# Patient Record
Sex: Female | Born: 1970 | Race: Black or African American | Hispanic: No | Marital: Single | Smoking: Never smoker
Health system: Southern US, Community
[De-identification: ages and names within clinical notes are randomized; demographics above are authoritative.]

## PROBLEM LIST (undated history)

## (undated) DIAGNOSIS — F32A Depression, unspecified: Secondary | ICD-10-CM

## (undated) DIAGNOSIS — E119 Type 2 diabetes mellitus without complications: Secondary | ICD-10-CM

## (undated) DIAGNOSIS — E282 Polycystic ovarian syndrome: Secondary | ICD-10-CM

## (undated) DIAGNOSIS — F419 Anxiety disorder, unspecified: Secondary | ICD-10-CM

## (undated) DIAGNOSIS — R011 Cardiac murmur, unspecified: Secondary | ICD-10-CM

## (undated) DIAGNOSIS — E079 Disorder of thyroid, unspecified: Secondary | ICD-10-CM

## (undated) DIAGNOSIS — E349 Endocrine disorder, unspecified: Secondary | ICD-10-CM

## (undated) DIAGNOSIS — C73 Malignant neoplasm of thyroid gland: Secondary | ICD-10-CM

## (undated) DIAGNOSIS — I10 Essential (primary) hypertension: Secondary | ICD-10-CM

## (undated) DIAGNOSIS — E0500 Thyrotoxicosis with diffuse goiter without thyrotoxic crisis or storm: Secondary | ICD-10-CM

## (undated) DIAGNOSIS — D219 Benign neoplasm of connective and other soft tissue, unspecified: Secondary | ICD-10-CM

## (undated) DIAGNOSIS — N943 Premenstrual tension syndrome: Secondary | ICD-10-CM

## (undated) DIAGNOSIS — F329 Major depressive disorder, single episode, unspecified: Secondary | ICD-10-CM

## (undated) DIAGNOSIS — E785 Hyperlipidemia, unspecified: Secondary | ICD-10-CM

## (undated) DIAGNOSIS — E039 Hypothyroidism, unspecified: Secondary | ICD-10-CM

## (undated) DIAGNOSIS — M1990 Unspecified osteoarthritis, unspecified site: Secondary | ICD-10-CM

## (undated) DIAGNOSIS — K219 Gastro-esophageal reflux disease without esophagitis: Secondary | ICD-10-CM

## (undated) DIAGNOSIS — T7840XA Allergy, unspecified, initial encounter: Secondary | ICD-10-CM

## (undated) DIAGNOSIS — F319 Bipolar disorder, unspecified: Secondary | ICD-10-CM

## (undated) DIAGNOSIS — R413 Other amnesia: Secondary | ICD-10-CM

## (undated) DIAGNOSIS — R42 Dizziness and giddiness: Secondary | ICD-10-CM

## (undated) DIAGNOSIS — J302 Other seasonal allergic rhinitis: Secondary | ICD-10-CM

## (undated) DIAGNOSIS — G43909 Migraine, unspecified, not intractable, without status migrainosus: Secondary | ICD-10-CM

## (undated) DIAGNOSIS — F4310 Post-traumatic stress disorder, unspecified: Secondary | ICD-10-CM

## (undated) DIAGNOSIS — D649 Anemia, unspecified: Secondary | ICD-10-CM

## (undated) HISTORY — DX: Hyperlipidemia, unspecified: E78.5

## (undated) HISTORY — DX: Gastro-esophageal reflux disease without esophagitis: K21.9

## (undated) HISTORY — PX: HERNIA REPAIR: SHX51

## (undated) HISTORY — DX: Polycystic ovarian syndrome: E28.2

## (undated) HISTORY — DX: Benign neoplasm of connective and other soft tissue, unspecified: D21.9

## (undated) HISTORY — PX: COLONOSCOPY: SHX174

## (undated) HISTORY — DX: Endocrine disorder, unspecified: E34.9

## (undated) HISTORY — PX: APPENDECTOMY: SHX54

## (undated) HISTORY — PX: ABDOMINAL HYSTERECTOMY: SHX81

## (undated) HISTORY — DX: Thyrotoxicosis with diffuse goiter without thyrotoxic crisis or storm: E05.00

## (undated) HISTORY — DX: Depression, unspecified: F32.A

## (undated) HISTORY — DX: Essential (primary) hypertension: I10

## (undated) HISTORY — DX: Allergy, unspecified, initial encounter: T78.40XA

## (undated) HISTORY — PX: DIAGNOSTIC LAPAROSCOPY: SUR761

## (undated) HISTORY — DX: Major depressive disorder, single episode, unspecified: F32.9

## (undated) HISTORY — PX: OTHER SURGICAL HISTORY: SHX169

## (undated) HISTORY — DX: Anemia, unspecified: D64.9

## (undated) HISTORY — DX: Premenstrual tension syndrome: N94.3

## (undated) HISTORY — PX: DILATION AND CURETTAGE OF UTERUS: SHX78

## (undated) HISTORY — DX: Other amnesia: R41.3

---
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
## 1999-05-03 ENCOUNTER — Encounter: Admission: RE | Admit: 1999-05-03 | Discharge: 1999-05-21 | Payer: Self-pay | Admitting: *Deleted

## 2003-06-14 DIAGNOSIS — C73 Malignant neoplasm of thyroid gland: Secondary | ICD-10-CM

## 2003-06-14 HISTORY — DX: Malignant neoplasm of thyroid gland: C73

## 2004-06-13 HISTORY — PX: TOTAL THYROIDECTOMY: SHX2547

## 2008-06-13 HISTORY — PX: MYOMECTOMY: SHX85

## 2010-08-13 ENCOUNTER — Encounter: Payer: Self-pay | Admitting: Internal Medicine

## 2010-08-13 ENCOUNTER — Ambulatory Visit
Payer: BC Managed Care – PPO | Attending: Internal Medicine | Admitting: Rehabilitative and Restorative Service Providers"

## 2010-08-13 ENCOUNTER — Other Ambulatory Visit: Payer: BC Managed Care – PPO

## 2010-08-13 ENCOUNTER — Ambulatory Visit (INDEPENDENT_AMBULATORY_CARE_PROVIDER_SITE_OTHER): Payer: BC Managed Care – PPO | Admitting: Internal Medicine

## 2010-08-13 ENCOUNTER — Other Ambulatory Visit: Payer: Self-pay | Admitting: Internal Medicine

## 2010-08-13 ENCOUNTER — Telehealth: Payer: Self-pay | Admitting: Internal Medicine

## 2010-08-13 DIAGNOSIS — E559 Vitamin D deficiency, unspecified: Secondary | ICD-10-CM | POA: Insufficient documentation

## 2010-08-13 DIAGNOSIS — IMO0001 Reserved for inherently not codable concepts without codable children: Secondary | ICD-10-CM | POA: Insufficient documentation

## 2010-08-13 DIAGNOSIS — R269 Unspecified abnormalities of gait and mobility: Secondary | ICD-10-CM | POA: Insufficient documentation

## 2010-08-13 DIAGNOSIS — R279 Unspecified lack of coordination: Secondary | ICD-10-CM

## 2010-08-13 DIAGNOSIS — E039 Hypothyroidism, unspecified: Secondary | ICD-10-CM | POA: Insufficient documentation

## 2010-08-13 DIAGNOSIS — R1084 Generalized abdominal pain: Secondary | ICD-10-CM | POA: Insufficient documentation

## 2010-08-13 DIAGNOSIS — D649 Anemia, unspecified: Secondary | ICD-10-CM | POA: Insufficient documentation

## 2010-08-13 LAB — HEPATIC FUNCTION PANEL
ALT: 14 U/L (ref 0–35)
Albumin: 3.8 g/dL (ref 3.5–5.2)
Alkaline Phosphatase: 44 U/L (ref 39–117)
Bilirubin, Direct: 0.1 mg/dL (ref 0.0–0.3)
Total Protein: 7.2 g/dL (ref 6.0–8.3)

## 2010-08-13 LAB — CBC WITH DIFFERENTIAL/PLATELET
Basophils Relative: 0.7 % (ref 0.0–3.0)
Eosinophils Absolute: 0.2 10*3/uL (ref 0.0–0.7)
Eosinophils Relative: 1.7 % (ref 0.0–5.0)
Hemoglobin: 9.9 g/dL — ABNORMAL LOW (ref 12.0–15.0)
MCHC: 32.4 g/dL (ref 30.0–36.0)
MCV: 81.1 fl (ref 78.0–100.0)
Monocytes Absolute: 0.7 10*3/uL (ref 0.1–1.0)
Neutro Abs: 6.6 10*3/uL (ref 1.4–7.7)
Neutrophils Relative %: 65.2 % (ref 43.0–77.0)
RBC: 3.77 Mil/uL — ABNORMAL LOW (ref 3.87–5.11)
WBC: 10.2 10*3/uL (ref 4.5–10.5)

## 2010-08-13 LAB — IBC PANEL
Saturation Ratios: 7.6 % — ABNORMAL LOW (ref 20.0–50.0)
Transferrin: 309.7 mg/dL (ref 212.0–360.0)

## 2010-08-13 LAB — BASIC METABOLIC PANEL
CO2: 28 mEq/L (ref 19–32)
Chloride: 103 mEq/L (ref 96–112)
Creatinine, Ser: 0.7 mg/dL (ref 0.4–1.2)
Potassium: 3.9 mEq/L (ref 3.5–5.1)
Sodium: 139 mEq/L (ref 135–145)

## 2010-08-13 LAB — CONVERTED CEMR LAB: Tissue Transglutaminase Ab, IgA: 4.4 units (ref <20)

## 2010-08-13 LAB — B12 AND FOLATE PANEL: Vitamin B-12: 606 pg/mL (ref 211–911)

## 2010-08-15 ENCOUNTER — Encounter: Payer: Self-pay | Admitting: Internal Medicine

## 2010-08-19 NOTE — Assessment & Plan Note (Signed)
Summary: NEW PT BCBS--#--PKG/OFF---STC   Vital Signs:  Patient profile:   40 year old female Menstrual status:  regular LMP:     07/14/2010 Height:      66.50 inches (168.91 cm) Weight:      255.38 pounds (116.08 kg) BMI:     40.75 O2 Sat:      98 % on Room air Temp:     98.1 degrees F (36.72 degrees C) oral Pulse rate:   76 / minute Pulse rhythm:   regular Resp:     14 per minute BP sitting:   120 / 70  (left arm) Cuff size:   large  Nutrition Counseling: Patient's BMI is greater than 25 and therefore counseled on weight management options.  O2 Flow:  Room air CC: NP to establish.Pt c/o dizziness/vertigo, nausea, headaches started in 05/2010.sls,cma Is Patient Diabetic? No Pain Assessment Patient in pain? no      LMP (date): 07/14/2010     Menstrual Status regular Enter LMP: 07/14/2010   Primary Care Provider:  Yetta Barre  CC:  NP to establish.Pt c/o dizziness/vertigo, nausea, headaches started in 05/2010.sls, and cma.  History of Present Illness: New to me her story is long. She tells me that she felt fine until 3 months ago when she develop a non-specific viral illness while living in [place]. She saw her PCP and was referred to multiple specialtsts ( Neurology, Cardiology, ENT, and Neuro-Rehab) and she tells me that she had extensive testing with labs, MRI, Holter monitor and ENG. She tells me that she was found to be anemic and had some benign palpitations on the Holter. There are no old records available to me today. She has had persistent symptoms including ataxia, dizziness, fatigue, abd pain, headaches, vertigo, and nausea. She tells me that she applied for STD and that her IM doctor in [place] fired her b/c " he could not help her anymore". 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  Preventive Screening-Counseling & Management  Alcohol-Tobacco     Alcohol drinks/day: <1     Alcohol type:  all     >5/day in last 3 mos: no     Alcohol Counseling: not indicated; use of alcohol is not excessive or problematic     Feels need to cut down: no     Feels annoyed by complaints: no     Feels guilty re: drinking: no     Needs 'eye opener' in am: no     Smoking Status: never     Tobacco Counseling: not indicated; no tobacco use  Caffeine-Diet-Exercise     Does Patient Exercise: no  Hep-HIV-STD-Contraception     Hepatitis Risk: no risk noted     HIV Risk: no risk noted     STD Risk: no risk noted      Sexual History:  currently monogamous.        Drug Use:  no.        Blood Transfusions:  no.    Medications Prior to Update: 1)  None  Current Medications (verified): 1)  Cyanocobalamin 1000 Mcg/ml Soln (Cyanocobalamin) .... Inject 1ml Monthly Subq 2)  Vitamin D3 1000 Unit Tabs (Cholecalciferol) .... (1) Once Daily 3)  Hydrocodone-Acetaminophen 5-500 Mg Tabs (Hydrocodone-Acetaminophen) .... (1) Q6 Hours As Needed For Pain 4)  Synthroid 88 Mcg Tabs (Levothyroxine Sodium) .... (1) Tab By Mouth Two Times A  Day 5)  Metoprolol Tartrate 25 Mg Tabs (Metoprolol Tartrate) .... 1/2 Tab By Mouth Two Times A Day 6)  Levbid 0.375 Mg Xr12h-Tab (Hyoscyamine Sulfate) .... One By Mouth Two Times A Day As Needed For Abd Pain  Allergies (verified): 1)  ! Aspirin 2)  ! Penicillin 3)  ! Morphine 4)  ! Codeine 5)  ! Ibuprofen  Past History:  Past Medical History: Hypothyroidism B-12 defic Anemia-NOS Fibroids Palpitations PCOS Vit D defic  Past Surgical History: Laparotomy-exploratory in 2010 Thyroidectomy  Family History: Family History of Alcoholism/Addiction Family History Diabetes 1st degree relative Family History Hypertension  Social History: Occupation: Disabled, Midwife Single Never Smoked Alcohol use-yes Drug use-no Regular exercise-no Smoking Status:  never Hepatitis Risk:  no risk noted HIV Risk:  no risk noted STD Risk:  no risk noted Sexual  History:  currently monogamous Blood Transfusions:  no Drug Use:  no Does Patient Exercise:  no  Review of Systems       The patient complains of weight gain, headaches, and abdominal pain.  The patient denies anorexia, fever, weight loss, chest pain, syncope, dyspnea on exertion, peripheral edema, prolonged cough, hemoptysis, melena, hematochezia, severe indigestion/heartburn, hematuria, muscle weakness, suspicious skin lesions, transient blindness, difficulty walking, depression, unusual weight change, abnormal bleeding, enlarged lymph nodes, and angioedema.   General:  Complains of fatigue and malaise; denies chills, fever, loss of appetite, sleep disorder, sweats, weakness, and weight loss. CV:  Complains of fatigue, lightheadness, and weight gain; denies bluish discoloration of lips or nails, chest pain or discomfort, difficulty breathing at night, difficulty breathing while lying down, fainting, leg cramps with exertion, near fainting, palpitations, shortness of breath with exertion, swelling of feet, and swelling of hands. Neuro:  Complains of poor balance and sensation of room spinning; denies disturbances in coordination, falling down, headaches, inability to speak, memory loss, numbness, seizures, tingling, tremors, visual disturbances, and weakness. Psych:  Denies alternate hallucination ( auditory/visual), anxiety, depression, easily angered, easily tearful, irritability, mental problems, panic attacks, sense of great danger, suicidal thoughts/plans, thoughts of violence, unusual visions or sounds, and thoughts /plans of harming others. Endo:  Complains of weight change; denies cold intolerance, excessive hunger, excessive thirst, excessive urination, heat intolerance, and polyuria. Heme:  Complains of pallor; denies abnormal bruising, bleeding, enlarge lymph nodes, fevers, and skin discoloration.  Physical Exam  General:  alert, well-developed, well-nourished, well-hydrated, appropriate  dress, normal appearance, healthy-appearing, cooperative to examination, good hygiene, and overweight-appearing.   Head:  normocephalic, atraumatic, no abnormalities observed, and no abnormalities palpated.   Eyes:  vision grossly intact, pupils equal, and no injection.  mm are pale. Ears:  R ear normal and L ear normal.   Mouth:  Oral mucosa and oropharynx without lesions or exudates.  Teeth in good repair. Neck:  supple, full ROM, no masses, no thyromegaly, no thyroid nodules or tenderness, no JVD, no HJR, normal carotid upstroke, no carotid bruits, no cervical lymphadenopathy, and no neck tenderness.   Chest Wall:  No deformities, masses, or tenderness noted. Lungs:  normal respiratory effort, no intercostal retractions, no accessory muscle use, normal breath sounds, no dullness, no fremitus, no crackles, and no wheezes.   Heart:  normal rate, regular rhythm, no murmur, no gallop, no rub, and no JVD.   Abdomen:  soft, non-tender, normal bowel sounds, no distention, no masses, no guarding, no rigidity, no rebound tenderness, no abdominal hernia, no inguinal hernia, no hepatomegaly, and no splenomegaly.   Msk:  No deformity or scoliosis noted  of thoracic or lumbar spine.   Pulses:  R and L carotid,radial,femoral,dorsalis pedis and posterior tibial pulses are full and equal bilaterally Extremities:  No clubbing, cyanosis, edema, or deformity noted with normal full range of motion of all joints.   Neurologic:  alert & oriented X3, cranial nerves II-XII intact, strength normal in all extremities, sensation intact to light touch, sensation intact to pinprick, gait normal, DTRs symmetrical and normal, finger-to-nose normal, heel-to-shin normal, toes down bilaterally on Babinski, and Romberg negative.   Skin:  Intact without suspicious lesions or rashes Cervical Nodes:  no anterior cervical adenopathy and no posterior cervical adenopathy.   Axillary Nodes:  no R axillary adenopathy and no L axillary  adenopathy.   Inguinal Nodes:  no R inguinal adenopathy and no L inguinal adenopathy.   Psych:  Cognition and judgment appear intact. Alert and cooperative with normal attention span and concentration. No apparent delusions, illusions, hallucinations   Impression & Recommendations:  Problem # 1:  ATAXIA (ICD-781.3) Assessment Unchanged I await her records from [facility] Orders: Venipuncture [phone]) TLB-B12 + Folate Pnl (82746_82607-B12/FOL) TLB-IBC Pnl (Iron/FE;Transferrin) (83550-IBC) TLB-BMP (Basic Metabolic Panel-BMET) (80048-METABOL) TLB-CBC Platelet - w/Differential (85025-CBCD) TLB-Hepatic/Liver Function Pnl (80076-HEPATIC) TLB-TSH (Thyroid Stimulating Hormone) (84443-TSH) T-Vitamin D (25-Hydroxy) (11914-78295)  Problem # 2:  ANEMIA-NOS (ICD-285.9) Assessment: Deteriorated  Her updated medication list for this problem includes:    Cyanocobalamin 1000 Mcg/ml Soln (Cyanocobalamin) ..... Inject 1ml monthly subq  Orders: Venipuncture (62130) TLB-B12 + Folate Pnl (86578_46962-X52/WUX) TLB-IBC Pnl (Iron/FE;Transferrin) (83550-IBC) TLB-BMP (Basic Metabolic Panel-BMET) (80048-METABOL) TLB-CBC Platelet - w/Differential (85025-CBCD) TLB-Hepatic/Liver Function Pnl (80076-HEPATIC) TLB-TSH (Thyroid Stimulating Hormone) (84443-TSH) T-Vitamin D (25-Hydroxy) (32440-10272) T-Sprue Panel (Celiac Disease Aby Eval) (83516x3/[address])  Problem # 3:  HYPOTHYROIDISM (ICD-244.9) Assessment: Unchanged  Her updated medication list for this problem includes:    Synthroid 88 Mcg Tabs (Levothyroxine sodium) ..... (1) tab by mouth two times a day  Orders: Venipuncture (53664) TLB-B12 + Folate Pnl (40347_42595-G38/VFI) TLB-IBC Pnl (Iron/FE;Transferrin) (83550-IBC) TLB-BMP (Basic Metabolic Panel-BMET) (80048-METABOL) TLB-CBC Platelet - w/Differential (85025-CBCD) TLB-Hepatic/Liver Function Pnl (80076-HEPATIC) TLB-TSH (Thyroid Stimulating Hormone) (84443-TSH) T-Vitamin D (25-Hydroxy)  (43329-51884)  Problem # 4:  VITAMIN D DEFICIENCY (ICD-268.9) Assessment: Unchanged  Orders: Venipuncture (16606) TLB-B12 + Folate Pnl (30160_10932-T55/DDU) TLB-IBC Pnl (Iron/FE;Transferrin) (83550-IBC) TLB-BMP (Basic Metabolic Panel-BMET) (80048-METABOL) TLB-CBC Platelet - w/Differential (85025-CBCD) TLB-Hepatic/Liver Function Pnl (80076-HEPATIC) TLB-TSH (Thyroid Stimulating Hormone) (84443-TSH) T-Vitamin D (25-Hydroxy) (20254-27062)  Problem # 5:  ABDOMINAL PAIN, GENERALIZED (ICD-789.07) this sounds like IBS to me so will start Levbid  Complete Medication List: 1)  Cyanocobalamin 1000 Mcg/ml Soln (Cyanocobalamin) .... Inject 1ml monthly subq 2)  Vitamin D3 1000 Unit Tabs (Cholecalciferol) .... (1) once daily 3)  Hydrocodone-acetaminophen 5-500 Mg Tabs (Hydrocodone-acetaminophen) .... (1) q6 hours as needed for pain 4)  Synthroid 88 Mcg Tabs (Levothyroxine sodium) .... (1) tab by mouth two times a day 5)  Metoprolol Tartrate 25 Mg Tabs (Metoprolol tartrate) .... 1/2 tab by mouth two times a day 6)  Levbid 0.375 Mg Xr12h-tab (Hyoscyamine sulfate) .... One by mouth two times a day as needed for abd pain  Patient Instructions: 1)  Please schedule a follow-up appointment in 2 weeks. 2)  It is important that you exercise regularly at least 20 minutes 5 times a week. If you develop chest pain, have severe difficulty breathing, or feel very tired , stop exercising immediately and seek medical attention. 3)  You need to lose weight. Consider a lower calorie diet and regular exercise.  4)  If you could be exposed  to sexually transmitted diseases, you should use a condom. 5)  If you are having sex and you or your partner don't want a child, use contraception. Prescriptions: LEVBID 0.375 MG XR12H-TAB (HYOSCYAMINE SULFATE) One by mouth two times a day as needed for abd pain  #60 x 11   Entered and Authorized by:   Etta Grandchild MD   Signed by:   Etta Grandchild MD on 08/13/2010   Method  used:   Print then Give to Patient   RxID:   [id number]    Orders Added: 1)  Venipuncture [14782] 2)  TLB-B12 + Folate Pnl [82746_82607-B12/FOL] 3)  TLB-IBC Pnl (Iron/FE;Transferrin) [83550-IBC] 4)  TLB-BMP (Basic Metabolic Panel-BMET) [80048-METABOL] 5)  TLB-CBC Platelet - w/Differential [85025-CBCD] 6)  TLB-Hepatic/Liver Function Pnl [80076-HEPATIC] 7)  TLB-TSH (Thyroid Stimulating Hormone) [84443-TSH] 8)  T-Vitamin D (25-Hydroxy) [95621-30865] 9)  T-Sprue Panel (Celiac Disease Aby Eval) [83516x3/[address]] 10)  New Patient Level V [78469]

## 2010-08-19 NOTE — Progress Notes (Signed)
    Tetanus/Td Immunization History:    Tetanus/Td # 1:  Historical (01/26/2005)

## 2010-08-24 ENCOUNTER — Encounter: Payer: BC Managed Care – PPO | Admitting: Rehabilitative and Restorative Service Providers"

## 2010-08-24 NOTE — Letter (Signed)
Summary: Results Follow-up Letter  [facility]  [address] [place], [place] [address]   Phone: [phone]  Fax: [phone]    08/15/2010  [address] [place], [place]  [address]  [place]  Dear Ms. CALDWELL LEWIS,   The following are the results of your recent test(s):  Test     Result     Thyroid     your dose is too low CBC       anemia Iron       low B12       normal Liver/kidney   normal   _________________________________________________________  Please call for an appointment or call soon _________________________________________________________ _________________________________________________________ _________________________________________________________  Sincerely,  Sanda Linger MD [facility]

## 2010-08-27 ENCOUNTER — Encounter: Payer: Self-pay | Admitting: Internal Medicine

## 2010-08-27 ENCOUNTER — Ambulatory Visit (INDEPENDENT_AMBULATORY_CARE_PROVIDER_SITE_OTHER): Payer: BC Managed Care – PPO | Admitting: Internal Medicine

## 2010-08-27 DIAGNOSIS — E039 Hypothyroidism, unspecified: Secondary | ICD-10-CM

## 2010-08-27 DIAGNOSIS — R279 Unspecified lack of coordination: Secondary | ICD-10-CM

## 2010-08-27 DIAGNOSIS — D649 Anemia, unspecified: Secondary | ICD-10-CM

## 2010-08-31 ENCOUNTER — Encounter: Payer: BC Managed Care – PPO | Admitting: Rehabilitative and Restorative Service Providers"

## 2010-08-31 NOTE — Assessment & Plan Note (Signed)
Summary: 2 WK FU  STC   Vital Signs:  Patient profile:   40 year old female Menstrual status:  regular LMP:     08/26/2010 Height:      66.50 inches Weight:      256.25 pounds BMI:     40.89 O2 Sat:      95 % on Room air Temp:     98.2 degrees F oral Pulse rate:   67 / minute Pulse rhythm:   regular Resp:     16 per minute BP sitting:   122 / 70  (right arm) Cuff size:   large  Vitals Entered By: Rock Nephew CMA (August 27, 2010 1:13 PM)  Nutrition Counseling: Patient's BMI is greater than 25 and therefore counseled on weight management options.  O2 Flow:  Room air CC: follow-up visit//2wk Is Patient Diabetic? No Pain Assessment Patient in pain? no       Does patient need assistance? Functional Status Self care Ambulation Normal LMP (date): 08/26/2010     Enter LMP: 08/26/2010   Primary Care Provider:  Yetta Barre  CC:  follow-up visit//2wk.  History of Present Illness: She returns for f/up and tells me that she feels well and has returned to good health. She is returning to work next week. The dizziness and ataxia have resolved.  Preventive Screening-Counseling & Management  Alcohol-Tobacco     Alcohol drinks/day: <1     Alcohol type: all     >5/day in last 3 mos: no     Alcohol Counseling: not indicated; use of alcohol is not excessive or problematic     Feels need to cut down: no     Feels annoyed by complaints: no     Feels guilty re: drinking: no     Needs 'eye opener' in am: no     Smoking Status: never     Tobacco Counseling: not indicated; no tobacco use  Hep-HIV-STD-Contraception     Hepatitis Risk: no risk noted     HIV Risk: no risk noted     STD Risk: no risk noted      Sexual History:  currently monogamous.        Drug Use:  no.        Blood Transfusions:  no.    Clinical Review Panels:  Immunizations   Last Tetanus Booster:  Historical (01/26/2005)  Diabetes Management   Creatinine:  0.7 (08/13/2010)  CBC   WBC:  10.2  (08/13/2010)   RBC:  3.77 (08/13/2010)   Hgb:  9.9 (08/13/2010)   Hct:  30.5 (08/13/2010)   Platelets:  498.0 (08/13/2010)   MCV  81.1 (08/13/2010)   MCHC  32.4 (08/13/2010)   RDW  17.2 (08/13/2010)   PMN:  65.2 (08/13/2010)   Lymphs:  25.6 (08/13/2010)   Monos:  6.8 (08/13/2010)   Eosinophils:  1.7 (08/13/2010)   Basophil:  0.7 (08/13/2010)  Complete Metabolic Panel   Glucose:  83 (08/13/2010)   Sodium:  139 (08/13/2010)   Potassium:  3.9 (08/13/2010)   Chloride:  103 (08/13/2010)   CO2:  28 (08/13/2010)   BUN:  11 (08/13/2010)   Creatinine:  0.7 (08/13/2010)   Albumin:  3.8 (08/13/2010)   Total Protein:  7.2 (08/13/2010)   Calcium:  9.7 (08/13/2010)   Total Bili:  0.3 (08/13/2010)   Alk Phos:  44 (08/13/2010)   SGPT (ALT):  14 (08/13/2010)   SGOT (AST):  18 (08/13/2010)   Medications Prior to Update: 1)  Cyanocobalamin 1000  Mcg/ml Soln (Cyanocobalamin) .... Inject 1ml Monthly Subq 2)  Vitamin D3 1000 Unit Tabs (Cholecalciferol) .... (1) Once Daily 3)  Hydrocodone-Acetaminophen 5-500 Mg Tabs (Hydrocodone-Acetaminophen) .... (1) Q6 Hours As Needed For Pain 4)  Synthroid 88 Mcg Tabs (Levothyroxine Sodium) .... (1) Tab By Mouth Two Times A Day 5)  Metoprolol Tartrate 25 Mg Tabs (Metoprolol Tartrate) .... 1/2 Tab By Mouth Two Times A Day 6)  Levbid 0.375 Mg Xr12h-Tab (Hyoscyamine Sulfate) .... One By Mouth Two Times A Day As Needed For Abd Pain  Current Medications (verified): 1)  Cyanocobalamin 1000 Mcg/ml Soln (Cyanocobalamin) .... Inject 1ml Monthly Subq 2)  Vitamin D3 1000 Unit Tabs (Cholecalciferol) .... (1) Once Daily 3)  Hydrocodone-Acetaminophen 5-500 Mg Tabs (Hydrocodone-Acetaminophen) .... (1) Q6 Hours As Needed For Pain 4)  Metoprolol Tartrate 25 Mg Tabs (Metoprolol Tartrate) .... 1/2 Tab By Mouth Two Times A Day 5)  Levbid 0.375 Mg Xr12h-Tab (Hyoscyamine Sulfate) .... One By Mouth Two Times A Day As Needed For Abd Pain 6)  Synthroid 200 Mcg Tabs (Levothyroxine  Sodium) .... One By Mouth Once Daily 7)  Feosol 200 (65 Fe) Mg Tabs (Ferrous Sulfate Dried) .... One By Mouth Two Times A Day With Food  Allergies (verified): 1)  ! Aspirin 2)  ! Penicillin 3)  ! Morphine 4)  ! Codeine 5)  ! 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 Family History of Alcoholism/Addiction Family History Diabetes 1st degree relative Family History Hypertension  Social History: Reviewed history from 08/13/2010 and no changes required. 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  The patient denies anorexia, fever, weight loss, chest pain, syncope, dyspnea on exertion, peripheral edema, prolonged cough, headaches, hemoptysis, abdominal pain, melena, hematochezia, severe indigestion/heartburn, hematuria, suspicious skin lesions, transient blindness, and abnormal bleeding.   Endo:  Complains of weight change; denies cold intolerance, excessive hunger, excessive thirst, excessive urination, heat intolerance, and polyuria. Heme:  Denies abnormal bruising, bleeding, enlarge lymph nodes,  fevers, pallor, and skin discoloration.  Physical Exam  General:  alert, well-developed, well-nourished, well-hydrated, appropriate dress, normal appearance, healthy-appearing, cooperative to examination, good hygiene, and overweight-appearing.   Head:  normocephalic, atraumatic, no abnormalities observed, and no abnormalities palpated.   Mouth:  Oral mucosa and oropharynx without lesions or exudates.  Teeth in good repair. Neck:  supple, full ROM, no masses, no thyromegaly, no thyroid nodules or tenderness, no JVD, no HJR, normal carotid upstroke, no carotid bruits, no cervical lymphadenopathy, and no neck tenderness.   Lungs:  normal respiratory effort, no intercostal retractions, no accessory muscle use, normal breath sounds, no dullness, no fremitus, no crackles, and no wheezes.   Heart:  normal rate, regular rhythm, no murmur, no gallop, no rub, and no JVD.   Abdomen:  soft, non-tender, normal bowel sounds, no distention, no masses,  no guarding, no rigidity, no rebound tenderness, no abdominal hernia, no inguinal hernia, no hepatomegaly, and no splenomegaly.   Msk:  No deformity or scoliosis noted of thoracic or lumbar spine.   Pulses:  R and L carotid,radial,femoral,dorsalis pedis and posterior tibial pulses are full and equal bilaterally Extremities:  No clubbing, cyanosis, edema, or deformity noted with normal full range of motion of all joints.   Neurologic:  alert & oriented X3, cranial nerves II-XII intact, strength normal in all extremities, sensation intact to light touch, sensation intact to pinprick, gait normal, and DTRs symmetrical and normal.   Skin:  Intact without suspicious lesions or rashes Cervical Nodes:  No lymphadenopathy noted Psych:  Cognition and judgment appear intact. Alert and cooperative with normal attention span and concentration. No apparent delusions, illusions, hallucinations   Impression & Recommendations:  Problem # 1:  ANEMIA-NOS (ICD-285.9) Assessment  Deteriorated  Her updated medication list for this problem includes:    Cyanocobalamin 1000 Mcg/ml Soln (Cyanocobalamin) ..... Inject 1ml monthly subq    Feosol 200 (65 Fe) Mg Tabs (Ferrous sulfate dried) ..... One by mouth two times a day with food  Hgb: 9.9 (08/13/2010)   Hct: 30.5 (08/13/2010)   Platelets: 498.0 (08/13/2010) RBC: 3.77 (08/13/2010)   RDW: 17.2 (08/13/2010)   WBC: 10.2 (08/13/2010) MCV: 81.1 (08/13/2010)   MCHC: 32.4 (08/13/2010) Iron: 33 (08/13/2010)   % Sat: 7.6 (08/13/2010) B12: 606 (08/13/2010)   Folate: 12.0 (08/13/2010)   TSH: 10.42 (08/13/2010)  Problem # 2:  ATAXIA (ICD-781.3) Assessment: Improved  Problem # 3:  HYPOTHYROIDISM (ICD-244.9) Assessment: Deteriorated  The following medications were removed from the medication list:    Synthroid 88 Mcg Tabs (Levothyroxine sodium) ..... (1) tab by mouth two times a day Her updated medication list for this problem includes:    Synthroid 200 Mcg Tabs (Levothyroxine sodium) ..... One by mouth once daily  Labs Reviewed: TSH: 10.42 (08/13/2010)     Complete Medication List: 1)  Cyanocobalamin 1000 Mcg/ml Soln (Cyanocobalamin) .... Inject 1ml monthly subq 2)  Vitamin D3 1000 Unit Tabs (Cholecalciferol) .... (1) once daily 3)  Hydrocodone-acetaminophen 5-500 Mg Tabs (Hydrocodone-acetaminophen) .... (1) q6 hours as needed for pain 4)  Metoprolol Tartrate 25 Mg Tabs (Metoprolol tartrate) .... 1/2 tab by mouth two times a day 5)  Levbid 0.375 Mg Xr12h-tab (Hyoscyamine sulfate) .... One by mouth two times a day as needed for abd pain 6)  Synthroid 200 Mcg Tabs (Levothyroxine sodium) .... One by mouth once daily 7)  Feosol 200 (65 Fe) Mg Tabs (Ferrous sulfate dried) .... One by mouth two times a day with food  Patient Instructions: 1)  Please schedule a follow-up appointment in 2 months. 2)  It is important that you exercise regularly at least 20 minutes 5 times a week. If you develop chest pain, have severe difficulty  breathing, or feel very tired , stop exercising immediately and seek medical attention. 3)  You need to lose weight. Consider a lower calorie diet and regular exercise.  Prescriptions: FEOSOL 200 (65 FE) MG TABS (FERROUS SULFATE DRIED) One by mouth two times a day with food  #60 x 11   Entered and Authorized by:   Etta Grandchild MD   Signed by:   Etta Grandchild MD on 08/27/2010   Method used:   Print then Give to Patient   RxID:   [id number] SYNTHROID 200 MCG TABS (LEVOTHYROXINE SODIUM) One by mouth once daily  #30 x 11   Entered and  Authorized by:   Etta Grandchild MD   Signed by:   Etta Grandchild MD on 08/27/2010   Method used:   Print then Give to Patient   RxID:   [id number]    Orders Added: 1)  Est. Patient Level IV [14782]

## 2010-10-11 ENCOUNTER — Ambulatory Visit (HOSPITAL_BASED_OUTPATIENT_CLINIC_OR_DEPARTMENT_OTHER)
Admission: RE | Admit: 2010-10-11 | Payer: BC Managed Care – PPO | Source: Ambulatory Visit | Admitting: Obstetrics and Gynecology

## 2011-05-21 DIAGNOSIS — R0602 Shortness of breath: Secondary | ICD-10-CM | POA: Insufficient documentation

## 2011-05-21 DIAGNOSIS — R509 Fever, unspecified: Secondary | ICD-10-CM | POA: Insufficient documentation

## 2011-05-21 DIAGNOSIS — R059 Cough, unspecified: Secondary | ICD-10-CM | POA: Insufficient documentation

## 2011-05-21 DIAGNOSIS — R05 Cough: Secondary | ICD-10-CM | POA: Insufficient documentation

## 2011-05-21 DIAGNOSIS — R079 Chest pain, unspecified: Secondary | ICD-10-CM | POA: Insufficient documentation

## 2011-05-21 DIAGNOSIS — R42 Dizziness and giddiness: Secondary | ICD-10-CM | POA: Insufficient documentation

## 2011-05-21 DIAGNOSIS — IMO0001 Reserved for inherently not codable concepts without codable children: Secondary | ICD-10-CM | POA: Insufficient documentation

## 2011-05-21 DIAGNOSIS — R6889 Other general symptoms and signs: Secondary | ICD-10-CM | POA: Insufficient documentation

## 2011-05-22 ENCOUNTER — Emergency Department (HOSPITAL_COMMUNITY)
Admission: EM | Admit: 2011-05-22 | Discharge: 2011-05-22 | Disposition: A | Discharge status: Home / Self Care | Payer: Self-pay | Attending: Emergency Medicine | Admitting: Emergency Medicine

## 2011-05-22 ENCOUNTER — Encounter: Payer: Self-pay | Admitting: *Deleted

## 2011-05-22 ENCOUNTER — Emergency Department (HOSPITAL_COMMUNITY): Payer: Self-pay

## 2011-05-22 DIAGNOSIS — J111 Influenza due to unidentified influenza virus with other respiratory manifestations: Secondary | ICD-10-CM

## 2011-05-22 MED ORDER — BENZONATATE 100 MG PO CAPS: 100.0000 mg | ORAL_CAPSULE | Freq: Three times a day (TID) | ORAL | Status: AC

## 2011-05-22 MED ORDER — ACETAMINOPHEN 325 MG PO TABS
650.0000 mg | ORAL_TABLET | Freq: Once | ORAL | Status: AC
  Administered 2011-05-22: 650 mg via ORAL
  Filled 2011-05-22: qty 2

## 2011-05-22 NOTE — ED Notes (Signed)
Pt c/o cough, hoarseness, chills for few days; vomiting tonight; pt c/o right arm tingling--able to move without difficulty;

## 2011-05-22 NOTE — ED Notes (Signed)
[id number]> Expected date:<BR> Expected time:<BR> Means of arrival:<BR> Comments:<BR> 62 yro, distended abd

## 2011-05-22 NOTE — ED Provider Notes (Signed)
History     CSN: [id number] Arrival date & time: 05/22/2011 12:14 AM   First MD Initiated Contact with Patient 05/22/11 0351      No chief complaint on file.   (Consider location/radiation/quality/duration/timing/severity/associated sxs/prior treatment) Patient is a 40 y.o. female presenting with URI. The history is provided by the patient. No language interpreter was used.  URI The primary symptoms include fever, fatigue, cough and myalgias. Primary symptoms do not include headaches, ear pain, sore throat, wheezing, abdominal pain, nausea or vomiting. The current episode started 3 to 5 days ago. This is a new problem. The problem has been gradually worsening.  The fever began 3 to 5 days ago. The fever has been gradually worsening since its onset. The maximum temperature recorded prior to her arrival was 102 to 102.9 F. The temperature was taken by an oral thermometer.  The fatigue began 3 to 5 days ago. The fatigue has been unchanged since its onset.  The cough began 3 to 5 days ago. The cough is new. The cough is productive.  Myalgias began 3 to 5 days ago. The myalgias have been gradually worsening since their onset. The myalgias are generalized. The myalgias are dull and aching. The discomfort from the myalgias is moderate. The myalgias are not associated with weakness.  Symptoms associated with the illness include chills, congestion and rhinorrhea.    Past Medical History  Diagnosis Date  . Cancer     Past Surgical History  Procedure Date  . Thyroidectomy     History reviewed. No pertinent family history.  History  Substance Use Topics  . Smoking status: Never Smoker   . Smokeless tobacco: Not on file  . Alcohol Use: No    OB History    Grav Para Term Preterm Abortions TAB SAB Ect Mult Living                  Review of Systems  Constitutional: Positive for fever, chills and fatigue. Negative for activity change and appetite change.  HENT: Positive for congestion  and rhinorrhea. Negative for ear pain, sore throat, neck pain and neck stiffness.   Respiratory: Positive for cough and shortness of breath. Negative for wheezing.   Cardiovascular: Negative for chest pain and palpitations.  Gastrointestinal: Negative for nausea, vomiting, abdominal pain and constipation.  Genitourinary: Negative for dysuria, urgency, frequency and flank pain.  Musculoskeletal: Positive for myalgias. Negative for back pain.  Neurological: Negative for dizziness, weakness, light-headedness, numbness and headaches.  All other systems reviewed and are negative.    Allergies  Aspirin; Codeine; Ibuprofen; Morphine; and Penicillins  Home Medications   Current Outpatient Rx  Name Route Sig Dispense Refill  . LEVOTHYROXINE SODIUM 200 MCG PO TABS Oral Take 200 mcg by mouth daily.      Marland Kitchen METOPROLOL TARTRATE 50 MG PO TABS Oral Take 25 mg by mouth 2 (two) times daily.      Marland Kitchen BENZONATATE 100 MG PO CAPS Oral Take 1 capsule (100 mg total) by mouth every 8 (eight) hours. 21 capsule 0    BP 122/62  Pulse 97  Temp(Src) 99.6 F (37.6 C) (Oral)  Resp 18  SpO2 100%  LMP 03/22/2011  Physical Exam  Nursing note and vitals reviewed. Constitutional: She is oriented to person, place, and time. She appears well-developed and well-nourished. No distress.  HENT:  Head: Normocephalic and atraumatic.  Mouth/Throat: Oropharynx is clear and moist.  Eyes: Conjunctivae and EOM are normal. Pupils are equal, round, and reactive to  light.  Neck: Normal range of motion. Neck supple.  Cardiovascular: Normal rate, regular rhythm and intact distal pulses.  Exam reveals friction rub. Exam reveals no gallop.   No murmur heard. Pulmonary/Chest: Effort normal and breath sounds normal. No respiratory distress. She has no wheezes.  Abdominal: Soft. Bowel sounds are normal. There is no tenderness.  Musculoskeletal: Normal range of motion.  Neurological: She is alert and oriented to person, place, and  time.  Skin: Skin is warm and dry.    ED Course  Procedures (including critical care time)  Labs Reviewed - No data to display Dg Chest 2 View  05/22/2011  *RADIOLOGY REPORT*  Clinical Data: Chest congestion.  Upper chest pain.  Right arm numbness.  Dizzy.  Cough.  CHEST - 2 VIEW  Comparison: None.  Findings: Shallow inspiration. The heart size and pulmonary vascularity are normal. The lungs appear clear and expanded without focal air space disease or consolidation. No blunting of the costophrenic angles.  No pneumothorax.  Mild degenerative changes in the mid thoracic spine.  IMPRESSION: No evidence of active pulmonary disease.  Original Report Authenticated By: Marlon Pel, M.D.     1. Influenza-like illness       MDM  Influenza-like illness. There is no indication for treatment at this time. Chest x-ray performed in triage is unremarkable. Tamiflu nor antibiotics are indicated at this time. She received Tylenol for her fever with improvement. She'll be discharged home with Jerilynn Som for her cough. She has many allergies therefore I will refrain from any additional treatments. She's instructed to followup with her primary care physician this coming week. She's provided clear signs and symptoms for which to return to the emergency department        Dayton Bailiff, MD 05/22/11 [phone]

## 2011-05-24 ENCOUNTER — Emergency Department (HOSPITAL_COMMUNITY)
Admission: EM | Admit: 2011-05-24 | Discharge: 2011-05-24 | Disposition: A | Discharge status: Home / Self Care | Payer: Self-pay | Attending: Emergency Medicine | Admitting: Emergency Medicine

## 2011-05-24 ENCOUNTER — Encounter (HOSPITAL_COMMUNITY): Payer: Self-pay | Admitting: Emergency Medicine

## 2011-05-24 DIAGNOSIS — E860 Dehydration: Secondary | ICD-10-CM | POA: Insufficient documentation

## 2011-05-24 DIAGNOSIS — R1110 Vomiting, unspecified: Secondary | ICD-10-CM | POA: Insufficient documentation

## 2011-05-24 LAB — URINE MICROSCOPIC-ADD ON

## 2011-05-24 LAB — URINALYSIS, ROUTINE W REFLEX MICROSCOPIC
Bilirubin Urine: NEGATIVE
Glucose, UA: NEGATIVE mg/dL
Ketones, ur: NEGATIVE mg/dL
Leukocytes, UA: NEGATIVE
Protein, ur: 30 mg/dL — AB

## 2011-05-24 LAB — POCT I-STAT, CHEM 8
BUN: 17 mg/dL (ref 6–23)
Calcium, Ion: 1.15 mmol/L (ref 1.12–1.32)
Glucose, Bld: 98 mg/dL (ref 70–99)
TCO2: 26 mmol/L (ref 0–100)

## 2011-05-24 MED ORDER — SODIUM CHLORIDE 0.9 % IV BOLUS (SEPSIS)
1000.0000 mL | Freq: Once | INTRAVENOUS | Status: AC
  Administered 2011-05-24: 1000 mL via INTRAVENOUS

## 2011-05-24 MED ORDER — ONDANSETRON HCL 4 MG/2ML IJ SOLN
4.0000 mg | Freq: Once | INTRAMUSCULAR | Status: AC
  Administered 2011-05-24: 4 mg via INTRAVENOUS
  Filled 2011-05-24: qty 2

## 2011-05-24 MED ORDER — OXYCODONE-ACETAMINOPHEN 5-325 MG PO TABS
1.0000 | ORAL_TABLET | Freq: Once | ORAL | Status: AC
  Administered 2011-05-24: 1 via ORAL
  Filled 2011-05-24: qty 1

## 2011-05-24 MED ORDER — OXYCODONE-ACETAMINOPHEN 5-325 MG PO TABS
1.0000 | ORAL_TABLET | Freq: Once | ORAL | Status: DC
  Filled 2011-05-24: qty 1

## 2011-05-24 NOTE — ED Notes (Signed)
As per EMS, pt was seen at [facility] 12/8 and Dx flu and D/C. Pt s/s has worsened and called EMS

## 2011-05-24 NOTE — ED Provider Notes (Signed)
History     CSN: [id number] Arrival date & time: 05/24/2011  5:58 AM   First MD Initiated Contact with Patient 05/24/11 [phone]      Chief Complaint  Patient presents with  . Nausea  . Generalized Body Aches  . Emesis    (Consider location/radiation/quality/duration/timing/severity/associated sxs/prior treatment) Patient is a 40 y.o. female presenting with vomiting. The history is provided by the patient.  Emesis     Pt prsents to the ED with complaints of "not feeling better". She was here on 12/8 and diagnose with the flu. Since then, she has not been able to keep fluids down. She does not have any nausea medications and is allergic to to many pain medications. She says that she has been drinking a lot of fluids but is not sure if she has kept them down. Pt denies SOb, CP, diarrhea. Admits to headaches, muscle aches, sore throat, vomiting and "feeling bad".  Past Medical History  Diagnosis Date  . Cancer     Past Surgical History  Procedure Date  . Thyroidectomy     No family history on file.  History  Substance Use Topics  . Smoking status: Never Smoker   . Smokeless tobacco: Not on file  . Alcohol Use: No    OB History    Grav Para Term Preterm Abortions TAB SAB Ect Mult Living                  Review of Systems  Gastrointestinal: Positive for vomiting.  All other systems reviewed and are negative.    Allergies  Aspirin; Codeine; Ibuprofen; Morphine; and Penicillins  Home Medications   Current Outpatient Rx  Name Route Sig Dispense Refill  . BENZONATATE 100 MG PO CAPS Oral Take 1 capsule (100 mg total) by mouth every 8 (eight) hours. 21 capsule 0  . LEVOTHYROXINE SODIUM 200 MCG PO TABS Oral Take 200 mcg by mouth daily.      Marland Kitchen METOPROLOL TARTRATE 50 MG PO TABS Oral Take 25 mg by mouth 2 (two) times daily.        BP 127/81  Pulse 99  Temp(Src) 97.7 F (36.5 C) (Oral)  Resp 18  Ht 5\' 7"  (1.702 m)  Wt 250 lb (113.399 kg)  BMI 39.16 kg/m2  SpO2  100%  LMP 03/22/2011  Physical Exam  Nursing note and vitals reviewed. Constitutional: She appears well-developed and well-nourished.       Pt is not in any distress but appears as though she does not feel well, talking very quietly and mildly tearful.  HENT:  Head: Normocephalic and atraumatic.  Eyes: Conjunctivae are normal. Pupils are equal, round, and reactive to light.  Neck: Trachea normal, normal range of motion and full passive range of motion without pain. Neck supple.  Cardiovascular: Normal rate, regular rhythm and normal pulses.   Pulmonary/Chest: Effort normal and breath sounds normal. Chest wall is not dull to percussion. She exhibits no tenderness, no crepitus, no edema, no deformity and no retraction.  Abdominal: Soft. Normal appearance and bowel sounds are normal.  Musculoskeletal: Normal range of motion.  Neurological: She is alert. She has normal strength.  Skin: Skin is warm, dry and intact.  Psychiatric: She has a normal mood and affect. Her speech is normal and behavior is normal. Judgment and thought content normal. Cognition and memory are normal.    ED Course  Procedures (including critical care time)  Labs Reviewed  POCT I-STAT, CHEM 8 - Abnormal; Notable for the  following:    Creatinine, Ser 1.20 (*)    Hemoglobin 10.2 (*)    HCT 30.0 (*)    All other components within normal limits  URINALYSIS, ROUTINE W REFLEX MICROSCOPIC - Abnormal; Notable for the following:    APPearance HAZY (*)    Hgb urine dipstick TRACE (*)    Protein, ur 30 (*)    All other components within normal limits  URINE MICROSCOPIC-ADD ON - Abnormal; Notable for the following:    Squamous Epithelial / LPF MANY (*)    Bacteria, UA MANY (*)    All other components within normal limits  PREGNANCY, URINE  I-STAT, CHEM 8   No results found.   No diagnosis found.    MDM  Pt received Zofran, pain medication and 2L bolus of NS. Pt is reeling markedly better and feels comfortable  going home with pain meds and some antinausea medication.       Dorthula Matas, PA 05/24/11 [phone]

## 2011-05-24 NOTE — ED Notes (Signed)
ED PA at bedside

## 2011-05-24 NOTE — ED Notes (Signed)
WGN:[id number]<HY> Expected date:05/24/11<BR> Expected time: 5:19 AM<BR> Means of arrival:Ambulance<BR> Comments:<BR> N,v sore throat

## 2011-05-25 NOTE — ED Provider Notes (Signed)
Medical screening examination/treatment/procedure(s) were performed by non-physician practitioner and as supervising physician I was immediately available for consultation/collaboration.   Maanasa Aderhold A. Patrica Duel, MD 05/25/11 1454

## 2011-09-05 ENCOUNTER — Ambulatory Visit (INDEPENDENT_AMBULATORY_CARE_PROVIDER_SITE_OTHER): Payer: BC Managed Care – PPO

## 2011-09-05 ENCOUNTER — Ambulatory Visit (INDEPENDENT_AMBULATORY_CARE_PROVIDER_SITE_OTHER): Payer: BC Managed Care – PPO | Admitting: Gynecology

## 2011-09-05 ENCOUNTER — Encounter: Payer: Self-pay | Admitting: Gynecology

## 2011-09-05 VITALS — BP 136/82 | Ht 66.25 in | Wt 240.0 lb

## 2011-09-05 DIAGNOSIS — D649 Anemia, unspecified: Secondary | ICD-10-CM

## 2011-09-05 DIAGNOSIS — N920 Excessive and frequent menstruation with regular cycle: Secondary | ICD-10-CM

## 2011-09-05 DIAGNOSIS — E282 Polycystic ovarian syndrome: Secondary | ICD-10-CM | POA: Insufficient documentation

## 2011-09-05 DIAGNOSIS — D251 Intramural leiomyoma of uterus: Secondary | ICD-10-CM

## 2011-09-05 DIAGNOSIS — E663 Overweight: Secondary | ICD-10-CM

## 2011-09-05 DIAGNOSIS — N946 Dysmenorrhea, unspecified: Secondary | ICD-10-CM

## 2011-09-05 DIAGNOSIS — N831 Corpus luteum cyst of ovary, unspecified side: Secondary | ICD-10-CM

## 2011-09-05 DIAGNOSIS — D259 Leiomyoma of uterus, unspecified: Secondary | ICD-10-CM

## 2011-09-05 DIAGNOSIS — E039 Hypothyroidism, unspecified: Secondary | ICD-10-CM

## 2011-09-05 DIAGNOSIS — D252 Subserosal leiomyoma of uterus: Secondary | ICD-10-CM

## 2011-09-05 NOTE — Patient Instructions (Addendum)
Hysterectomy Information  A hysterectomy is a procedure where your uterus is surgically removed. It will no longer be possible to have menstrual periods or to become pregnant. The tubes and ovaries can be removed (bilateral salpingo-oopherectomy) during this surgery as well.  REASONS FOR A HYSTERECTOMY  Persistent, abnormal bleeding.   Lasting (chronic) pelvic pain or infection.   The lining of the uterus (endometrium) starts growing outside the uterus (endometriosis).   The endometrium starts growing in the muscle of the uterus (adenomyosis).   The uterus falls down into the vagina (pelvic organ prolapse).   Symptomatic uterine fibroids.   Precancerous cells.   Cervical cancer or uterine cancer.  TYPES OF HYSTERECTOMIES  Supracervical hysterectomy. This type removes the top part of the uterus, but not the cervix.   Total hysterectomy. This type removes the uterus and cervix.   Radical hysterectomy. This type removes the uterus, cervix, and the fibrous tissue that holds the uterus in place in the pelvis (parametrium).  WAYS A HYSTERECTOMY CAN BE PERFORMED  Abdominal hysterectomy. A large surgical cut (incision) is made in the abdomen. The uterus is removed through this incision.   Vaginal hysterectomy. An incision is made in the vagina. The uterus is removed through this incision. There are no abdominal incisions.   Conventional laparoscopic hysterectomy. A thin, lighted tube with a camera (laparoscope) is inserted into 3 or 4 small incisions in the abdomen. The uterus is cut into small pieces. The small pieces are removed through the incisions, or they are removed through the vagina.   Laparoscopic assisted vaginal hysterectomy (LAVH). Three or four small incisions are made in the abdomen. Part of the surgery is performed laparoscopically and part vaginally. The uterus is removed through the vagina.   Robot-assisted laparoscopic hysterectomy. A laparoscope is inserted into 3 or 4  small incisions in the abdomen. A computer-controlled device is used to give the surgeon a 3D image. This allows for more precise movements of surgical instruments. The uterus is cut into small pieces and removed through the incisions or removed through the vagina.  RISKS OF HYSTERECTOMY   Bleeding and risk of blood transfusion. Tell your caregiver if you do not want to receive any blood products.   Blood clots in the legs or lung.   Infection.   Injury to surrounding organs.   Anesthesia problems or side effects.   Conversion to an abdominal hysterectomy.  WHAT TO EXPECT AFTER A HYSTERECTOMY  You will be given pain medicine.   You will need to have someone with you for the first 3 to 5 days after you go home.   You will need to follow up with your surgeon in 2 to 4 weeks after surgery to evaluate your progress.   You may have early menopause symptoms like hot flashes, night sweats, and insomnia.   If you had a hysterectomy for a problem that was not a cancer or a condition that could lead to cancer, then you no longer need Pap tests. However, even if you no longer need a Pap test, a regular exam is a good idea to make sure no other problems are starting.  Document Released: 11/23/2000 Document Revised: 05/19/2011 Document Reviewed: 01/08/2011 Exit[employer] Patient Information 2012 [employer]  Fibroids You have been diagnosed as having a fibroid. Fibroids are smooth muscle lumps (tumors) which can occur any place in a woman's body. They are usually in the womb (uterus). The most common problem (symptom) of fibroids is bleeding. Over time this   may cause low red blood cells (anemia). Other symptoms include feelings of pressure and pain in the pelvis. The diagnosis (learning what is wrong) of fibroids is made by physical exam. Sometimes tests such as an ultrasound are used. This is helpful when fibroids are felt around the ovaries and to look for tumors. TREATMENT   Most fibroids do not  need surgical or medical treatment. Sometimes a tissue sample (biopsy) of the lining of the uterus is done to rule out cancer. If there is no cancer and only a small amount of bleeding, the problem can be watched.   Hormonal treatment can improve the problem.   When surgery is needed, it can consist of removing the fibroid. Vaginal birth may not be possible after the removal of fibroids. This depends on where they are and the extent of surgery. When pregnancy occurs with fibroids it is usually normal.   Your caregiver can help decide which treatments are best for you.  HOME CARE INSTRUCTIONS   Do not use aspirin as this may increase bleeding problems.   If your periods (menses) are heavy, record the number of pads or tampons used per month. Bring this information to your caregiver. This can help them determine the best treatment for you.  SEEK IMMEDIATE MEDICAL CARE IF:  You have pelvic pain or cramps not controlled with medications, or experience a sudden increase in pain.   You have an increase of pelvic bleeding between and during menses.   You feel lightheaded or have fainting spells.   You develop worsening belly (abdominal) pain.  Document Released: 05/27/2000 Document Revised: 05/19/2011 Document Reviewed: 01/17/2008 Exit[employer] Patient Information 2012 [employer]. 

## 2011-09-05 NOTE — Progress Notes (Addendum)
Patient is a 41 year old gravida 2 para 0 AB 2 (2 elective AB) presented to the office today as a new patient is a result of worsening dysmenorrhea menorrhagia and anemia as a result of leiomyomatous uteri. Patient has a history of a robotic laparoscopic myomectomy in 2010 at [facility]. She moved to [facility] saw another provider in 2011 and had a sonohysterogram and a thought that she had some intracavitary lesion and was recommended to undergo a D&C but did not follow through due to lack of insurance  and limited financial resources. Patient stated this since January she spotted between periods. She states her cycles are heavy the first 5 days but total days they may last from 7-14 days. Patient currently not sexually active but when she has been in the past she has complained of dyspareunia. Patient states she had a normal Pap smear March of 2011. Patient denies any prior history of any abnormal Pap smear or any STDs. She had seen another provider in the community recently had a fingerstick hemoglobin of 10.4 for which she's currently taking iron 3 times daily. Patient history of PCO S.  Patient suffers from depression for which she's currently on Prozac. She has a history of thyroid cancer treated with iodine 131 and subsequent total thyroidectomy for which she's currently now on Synthroid 200 mcg daily. Her primary physician is Dr. Jonny Ruiz at [facility] family [facility] and she has not had any thyroid function test in a year. Her internist had her on Lopressor 25 mg daily which she currently takes for hypertension.  As a result of her heavy periods she has noticed quite a lot of days of work. Patient weighs 240 pounds has BMI of 38.45 an ultrasound was ordered today for better assessment of her fibroid uterus. She has not had an ultrasound several years.  Pelvic exam: Bartholin urethra Skene was within normal limits Vagina: No lesions or discharge Cervix no lesion  discharge Uterus: Anteverted fibroid palpated behind cervix uterus irregular size approximately 10-12 week size Adnexa: Unable to examine due to patient's abdominal girth and slight vaginismus. Rectal: Not examined  Ultrasound report: Uterus measures 10.2 x 9.8 x 11.4 cm with endometrial stripe of 15.7 mm (patient currently on day 21 of her cycle). Uterus was enlarged with subserous and intramural myomas bearing in size with the largest one measuring 6.6 x 6.2 cm and one posteriorly located adjacent to the cervix measuring 25 x 23 mm. A right thick wall cyst avascular measuring 29 x 23 mm was noted. Left ovary was normal. Some fluid was noted in the cul-de-sac.  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 Patient not interested in having children and wants definitive surgery to improve her quality of life. We discussed possibly having to do an abdominal hysterectomy with ovarian conservation versus placing patient on Lupron 3.75 mg IM and repeating the ultrasound to see if the cervical myoma has gotten smaller than we can do a total laparoscopic hysterectomy that this would be a no other alternative. Patient states that she wants to get this over with as soon as possible and is concerned about her past surgeries and the location of this fibroid and possible complications from a laparoscopic approach. We'll proceed with the following:  #1. Schedule abdominal hysterectomy with ovarian conservation in one month #2 administered Lupron 3.75 mg IM this week #3 check a TSH and CBC today #4 continue to take iron supplementation 3 times a day #5 see patient week  prior to her surgery to recheck CBC and to do an endometrial biopsy and preoperative examination as well as preoperative labs and Pap smear.  Literature information on fibroids and hysterectomy was provided. All questions were answered we'll follow accordingly.

## 2011-09-06 ENCOUNTER — Telehealth: Payer: Self-pay

## 2011-09-06 LAB — CBC WITH DIFFERENTIAL/PLATELET
Basophils Absolute: 0.1 10*3/uL (ref 0.0–0.1)
Basophils Relative: 1 % (ref 0–1)
Lymphocytes Relative: 49 % — ABNORMAL HIGH (ref 12–46)
MCHC: 30.9 g/dL (ref 30.0–36.0)
Neutro Abs: 3.8 10*3/uL (ref 1.7–7.7)
Neutrophils Relative %: 46 % (ref 43–77)
Platelets: 503 10*3/uL — ABNORMAL HIGH (ref 150–400)
RDW: 15.5 % (ref 11.5–15.5)
WBC: 8.3 10*3/uL (ref 4.0–10.5)

## 2011-09-06 LAB — TSH: TSH: 91.429 u[IU]/mL — ABNORMAL HIGH (ref 0.350–4.500)

## 2011-09-06 NOTE — Telephone Encounter (Signed)
Left message for patient to call me regarding surgery is scheduled.

## 2011-09-07 ENCOUNTER — Encounter: Payer: Self-pay | Admitting: Internal Medicine

## 2011-09-07 ENCOUNTER — Ambulatory Visit (INDEPENDENT_AMBULATORY_CARE_PROVIDER_SITE_OTHER): Payer: BC Managed Care – PPO | Admitting: Internal Medicine

## 2011-09-07 ENCOUNTER — Telehealth: Payer: Self-pay

## 2011-09-07 VITALS — BP 120/82 | HR 74 | Temp 98.8°F | Resp 16 | Wt 243.0 lb

## 2011-09-07 DIAGNOSIS — E039 Hypothyroidism, unspecified: Secondary | ICD-10-CM

## 2011-09-07 DIAGNOSIS — N83202 Unspecified ovarian cyst, left side: Secondary | ICD-10-CM

## 2011-09-07 DIAGNOSIS — N83209 Unspecified ovarian cyst, unspecified side: Secondary | ICD-10-CM

## 2011-09-07 DIAGNOSIS — N83201 Unspecified ovarian cyst, right side: Secondary | ICD-10-CM

## 2011-09-07 DIAGNOSIS — D649 Anemia, unspecified: Secondary | ICD-10-CM

## 2011-09-07 DIAGNOSIS — D259 Leiomyoma of uterus, unspecified: Secondary | ICD-10-CM

## 2011-09-07 MED ORDER — HYDROCODONE-ACETAMINOPHEN 2.5-500 MG PO TABS: 1.0000 | ORAL_TABLET | Freq: Four times a day (QID) | ORAL | Status: DC | PRN

## 2011-09-07 MED ORDER — "SYRINGE/NEEDLE (DISP) 25G X 5/8"" 1 ML MISC": Status: DC

## 2011-09-07 MED ORDER — BISACODYL-PEG-KCL-NABICAR-NACL 5-210 MG-GM PO KIT: PACK | ORAL | Status: DC

## 2011-09-07 MED ORDER — CYANOCOBALAMIN 1000 MCG/ML IJ SOLN: 1000.0000 ug | INTRAMUSCULAR | Status: DC

## 2011-09-07 NOTE — Patient Instructions (Signed)

## 2011-09-07 NOTE — Progress Notes (Signed)
  Subjective:    Patient ID: Lynn Martinez, female    DOB: [birth year], 41 y.o.   MRN: [id number]  Thyroid Problem Presents for follow-up visit. Symptoms include cold intolerance, constipation, depressed mood, fatigue, menstrual problem and weight gain. Patient reports no anxiety, diaphoresis, diarrhea, dry skin, hair loss, heat intolerance, hoarse voice, leg swelling, nail problem, palpitations, tremors, visual change or weight loss. The symptoms have been improving.      Review of Systems  Constitutional: Positive for weight gain and fatigue. Negative for fever, chills, weight loss, diaphoresis, activity change and unexpected weight change.  HENT: Negative.  Negative for hoarse voice.   Eyes: Negative.   Respiratory: Negative for cough, choking, chest tightness, shortness of breath, wheezing and stridor.   Cardiovascular: Negative for chest pain, palpitations and leg swelling.  Gastrointestinal: Positive for constipation. Negative for nausea, vomiting, abdominal pain, diarrhea, blood in stool, abdominal distention, anal bleeding and rectal pain.  Genitourinary: Positive for vaginal bleeding and menstrual problem. Negative for dysuria, urgency, frequency, hematuria, flank pain, decreased urine volume, vaginal discharge, enuresis, difficulty urinating, genital sores, vaginal pain, pelvic pain and dyspareunia.  Musculoskeletal: Negative for myalgias, back pain, joint swelling, arthralgias and gait problem.  Skin: Negative for color change, pallor, rash and wound.  Neurological: Negative.  Negative for tremors.  Hematological: Positive for cold intolerance. Negative for heat intolerance and adenopathy. Does not bruise/bleed easily.  Psychiatric/Behavioral: Negative.        Objective:   Physical Exam  Vitals reviewed. Constitutional: She is oriented to person, place, and time. She appears well-developed and well-nourished. No distress.  HENT:  Head: Normocephalic and atraumatic.    Mouth/Throat: Oropharynx is clear and moist. No oropharyngeal exudate.  Eyes: Conjunctivae are normal. Right eye exhibits no discharge. Left eye exhibits no discharge. No scleral icterus.  Neck: Normal range of motion. Neck supple. No JVD present. No tracheal deviation present. No thyromegaly present.  Cardiovascular: Normal rate, regular rhythm, normal heart sounds and intact distal pulses.  Exam reveals no gallop and no friction rub.   No murmur heard. Pulmonary/Chest: Effort normal and breath sounds normal. No stridor. No respiratory distress. She has no wheezes. She has no rales. She exhibits no tenderness.  Abdominal: Soft. Bowel sounds are normal. She exhibits no distension and no mass. There is no tenderness. There is no rebound and no guarding.  Musculoskeletal: Normal range of motion. She exhibits no edema and no tenderness.  Lymphadenopathy:    She has no cervical adenopathy.  Neurological: She is oriented to person, place, and time.  Skin: Skin is warm and dry. No rash noted. She is not diaphoretic. No erythema. No pallor.  Psychiatric: She has a normal mood and affect. Her behavior is normal. Judgment and thought content normal.      Lab Results  Component Value Date   WBC 8.3 09/05/2011   HGB 9.8* 09/05/2011   HCT 31.7* 09/05/2011   PLT 503* 09/05/2011   GLUCOSE 98 05/24/2011   ALT 14 08/13/2010   AST 18 08/13/2010   NA 142 05/24/2011   K 4.0 05/24/2011   CL 105 05/24/2011   CREATININE 1.20* 05/24/2011   BUN 17 05/24/2011   CO2 28 08/13/2010   TSH 91.429* 09/05/2011      Assessment & Plan:

## 2011-09-07 NOTE — Telephone Encounter (Signed)
I told patient Lynn Martinez has ordered Lupron and hopefully it will be here and she can get injection by next week. I scheduled her surgery based on that and it is set for April 20 at 7:30am.  She knows to expect call from the Lupron company with ins benefits so she can give them okay to order and send it.  We scheduled her an appt with Dr. Glenetta Hew for April 17 at 12:00pm and I told her he wants to consult as well as pap smear, CBC (order put in system)  and endo biopsy.  I told her Dr. Glenetta Hew wants her to have medical clearance for surgery from Dr. Oliver Barre, her primary care MD, and she is going to see him today at 1:00pm and will talk with him about this then.  I asked her to have him send Dr. Glenetta Hew something in writing or put it in her chart and she can let us know and we will be able to see it in her e-chart.  I let her know that Dr. Glenetta Hew wants her to do a bowel prep day before surgery so she will need to stay home that day and be on a clear liquid diet. I have mailed her the sheet on clear liquid diet and e-scribe the bowel prep to her pharmacy. She will do the bowel prep as directed and call if any questions/problems with it.  Medical records from [facility] have already arrived and are in her hardcopy chart.

## 2011-09-13 DIAGNOSIS — N83201 Unspecified ovarian cyst, right side: Secondary | ICD-10-CM | POA: Insufficient documentation

## 2011-09-13 NOTE — Assessment & Plan Note (Signed)
She has painful OV cysts and wants something for pain

## 2011-09-13 NOTE — Assessment & Plan Note (Signed)
She had not taken the synthroid for several months thus her TSH was high, she is back on Synthroid now, I will not clear her for surgery until her TSH has normalized

## 2011-09-13 NOTE — Assessment & Plan Note (Signed)
She has heavy bleeding and is scheduled for a hysterectomy in one month

## 2011-09-14 ENCOUNTER — Telehealth: Payer: Self-pay | Admitting: *Deleted

## 2011-09-14 NOTE — Telephone Encounter (Signed)
Lynn Martinez sent out for pharmacy benefits to [facility] on 09/06/11, Fax back for her benefits cover injection through [facility] pharmacy. Fax said they would call us. Since no call, I called them today to follow up and they now have the Rx and will get it soon.

## 2011-09-14 NOTE — Telephone Encounter (Signed)
Message copied by Richardson Chiquito on Wed Sep 14, 2011  5:33 PM ------      Message from: Keenan Bachelor      Created: Tue Sep 06, 2011  9:13 AM      Regarding: Lupron 3.75       Amy,       Just backtracking to be sure that Dr. Glenetta Hew let you know that this patient needs Lupron 3.75.  Today is Tuesday and he seems to think she might get it this week?        I am going to hang on scheduling her Hysterectomy for one month until I hear from you. Thanks!                  ----- Message -----         From: Ok Edwards, MD         Sent: 09/05/2011   5:41 PM           To: Keenan Bachelor, MA            Olegario Messier, please schedule a total abdominal hysterectomy for this patient in one month. Please see my last encounter note we'll need to place her on Lupron 3.75 mg for her to have administered here sometime this week. I would like to see her  the week prior to her surgery to do all her preop labs here and to do an endometrial biopsy along with her Pap smear and exam. Please double check and as patient she signed medical release on her robotic myomectomy at [facility]. Thank you

## 2011-09-19 ENCOUNTER — Telehealth: Payer: Self-pay

## 2011-09-19 DIAGNOSIS — N83201 Unspecified ovarian cyst, right side: Secondary | ICD-10-CM

## 2011-09-19 MED ORDER — HYDROCODONE-ACETAMINOPHEN 7.5-750 MG PO TABS: 1.0000 | ORAL_TABLET | Freq: Four times a day (QID) | ORAL | Status: AC | PRN

## 2011-09-19 NOTE — Telephone Encounter (Signed)
An RX for the higher dose of vicodin is sitting on your desk

## 2011-09-19 NOTE — Telephone Encounter (Signed)
Pt called stating that current pain medication is not helping with pain. Pt is requesting an increased strength, please advise.

## 2011-09-20 ENCOUNTER — Encounter (HOSPITAL_COMMUNITY): Payer: Self-pay | Admitting: Pharmacist

## 2011-09-20 NOTE — Telephone Encounter (Signed)
Rx faxed in to pharmacy on file and pt notified

## 2011-09-22 ENCOUNTER — Telehealth: Payer: Self-pay

## 2011-09-22 NOTE — Telephone Encounter (Signed)
Pt called stating that pain medications are only causing her to become sleepy and not helping with pain. Pt is requesting advisement form MD.

## 2011-09-22 NOTE — Telephone Encounter (Signed)
Patient notified per MD.

## 2011-09-22 NOTE — Telephone Encounter (Signed)
She should change to aleve 2 po twice a day

## 2011-09-23 ENCOUNTER — Other Ambulatory Visit: Payer: Self-pay | Admitting: *Deleted

## 2011-09-23 ENCOUNTER — Ambulatory Visit (INDEPENDENT_AMBULATORY_CARE_PROVIDER_SITE_OTHER): Payer: BC Managed Care – PPO

## 2011-09-23 DIAGNOSIS — N920 Excessive and frequent menstruation with regular cycle: Secondary | ICD-10-CM

## 2011-09-23 MED ORDER — LEUPROLIDE ACETATE 3.75 MG IM KIT
3.7500 mg | PACK | Freq: Once | INTRAMUSCULAR | Status: AC
  Administered 2011-09-23: 3.75 mg via INTRAMUSCULAR

## 2011-09-23 MED ORDER — LEUPROLIDE ACETATE 3.75 MG IM KIT: 3.7500 mg | PACK | INTRAMUSCULAR | Status: DC

## 2011-09-23 NOTE — Telephone Encounter (Signed)
Pt was contacted on benefits and the pharmacy called back to Amy to confirm shipment.

## 2011-09-23 NOTE — Progress Notes (Signed)
Patient informed Lupron here and will come in for shot today.

## 2011-09-26 ENCOUNTER — Telehealth: Payer: Self-pay | Admitting: *Deleted

## 2011-09-26 NOTE — Telephone Encounter (Signed)
Patient states she has OV on 04.24.13 and would like to know if you her to do labs before OV, and also, if she is getting T3, T4 or just TSH.? Please advise.

## 2011-09-26 NOTE — Telephone Encounter (Signed)
I do labs at the time of an OV, usually just TSH

## 2011-09-26 NOTE — Telephone Encounter (Signed)
Informed patient

## 2011-09-28 ENCOUNTER — Encounter: Payer: Self-pay | Admitting: Gynecology

## 2011-09-28 ENCOUNTER — Ambulatory Visit (INDEPENDENT_AMBULATORY_CARE_PROVIDER_SITE_OTHER): Payer: BC Managed Care – PPO | Admitting: Gynecology

## 2011-09-28 VITALS — BP 128/86

## 2011-09-28 DIAGNOSIS — N920 Excessive and frequent menstruation with regular cycle: Secondary | ICD-10-CM

## 2011-09-28 DIAGNOSIS — Z124 Encounter for screening for malignant neoplasm of cervix: Secondary | ICD-10-CM

## 2011-09-28 DIAGNOSIS — N946 Dysmenorrhea, unspecified: Secondary | ICD-10-CM

## 2011-09-28 DIAGNOSIS — E039 Hypothyroidism, unspecified: Secondary | ICD-10-CM

## 2011-09-28 DIAGNOSIS — Z01818 Encounter for other preprocedural examination: Secondary | ICD-10-CM

## 2011-09-28 DIAGNOSIS — D649 Anemia, unspecified: Secondary | ICD-10-CM

## 2011-09-28 DIAGNOSIS — D259 Leiomyoma of uterus, unspecified: Secondary | ICD-10-CM

## 2011-09-28 MED ORDER — MEGESTROL ACETATE 40 MG PO TABS: 40.0000 mg | ORAL_TABLET | Freq: Two times a day (BID) | ORAL | Status: DC

## 2011-09-28 NOTE — Progress Notes (Signed)
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 Patient has a history of a robotic laparoscopic myomectomy in 2010 at [facility]. She moved to [place] [employer] saw another provider in 2011 and had a sonohysterogram and a thought that she had some intracavitary lesion and was recommended to undergo a D&C but did not follow through due to lack of insurance and limited financial resources. Patient stated this since January she spotted between periods. She states her cycles are heavy the first 5 days but total days they may last from 7-14 days. Patient currently not sexually active but when she has been in the past she has complained of dyspareunia. Patient states she had a normal Pap smear March of 2011 and in 2013. Patient denies any prior history of any abnormal Pap smear or any STDs. She had seen another provider in the community recently had a fingerstick hemoglobin of 10.4 for which she's currently taking iron 3 times daily. Patient history of PCO S.  Patient suffers from depression for which she's currently on Prozac. She has a history of thyroid cancer treated with iodine 131 and subsequent total thyroidectomy for which she's currently now on Synthroid 200 mcg daily. Her primary physician is Dr. John at [facility] and she has not had any thyroid function test in a year. Her internist had her on Lopressor 25 mg daily which she currently takes for hypertension.  As a result of her heavy periods she has missed quite a lot of days of work. Patient weighs 240 pounds has BMI of 38.45 an ultrasound was ordered with the following results:   Uterus measures 10.2 x 9.8 x 11.4 cm with endometrial stripe of 15.7 mm (patient currently on day 21 of her cycle). Uterus was enlarged  with subserous and intramural myomas bearing in size with the largest one measuring 6.6 x 6.2 cm and one posteriorly located adjacent to the cervix measuring 25 x 23 mm. A right thick wall cyst avascular measuring 29 x 23 mm was noted. Left ovary was normal. Some fluid was noted in the cul-de-sac    Pertinent Gynecological History: Menses: Spotting throughout the month and heavy periods lasting 7-10 days. Bleeding: intermenstrual bleeding Contraception: none DES exposure: denies Blood transfusions: none Sexually transmitted diseases: no past history Previous GYN Procedures: None  Last mammogram: Not indicated Date: Not indicated Last pap: normal Date: 2013 OB History: G 0, P 0   Menstrual History: Menarche age: 14 Patient's last menstrual period was 08/20/2011.    Past Medical History  Diagnosis Date  . Depression   . Hypertension   . Cancer 2005    THYROID  . Fibroid   . PMS (premenstrual syndrome)   . Hormone disorder   . PCOS (polycystic ovarian syndrome)   . Grave's disease   . Memory loss   . Anemia     Past Surgical History  Procedure Date  . Thyroidectomy   . Myomectomy 2010    LAPAROSCOPY  . Umbilical hernia repair 1982    Family History  Problem Relation Age of Onset  . Thyroid disease Mother   . Diabetes Father   . Hypertension Father   . Heart disease Father   . Cancer Maternal Aunt     BRAIN  . Cancer Maternal Grandmother       LYMPHOMA  . Hypertension Maternal Grandmother   . Cancer Paternal Grandmother     PANCREATIC  . Diabetes Paternal Grandmother   . Hypertension Paternal Grandmother   . Hypertension Maternal Grandfather   . Hypertension Paternal Grandfather     Social History:  reports that she has never smoked. She does not have any smokeless tobacco history on file. She reports that she drinks alcohol. She reports that she does not use illicit drugs.  Allergies:  Allergies  Allergen Reactions  . Aspirin   . Codeine Itching     Tolerates Hydrocodone OK.  . Ibuprofen   . Morphine   . Penicillins      (Not in a hospital admission)  @ROS@  Blood pressure 128/86, last menstrual period 08/20/2011.  Physical Exam:  HEENT:unremarkable Neck:Supple, midline, no thyroid megaly, no carotid bruits Lungs:  Clear to auscultation no rhonchi's or wheezes Heart:Regular rate and rhythm, no murmurs or gallops Breast Exam: Not recently examined Abdomen: Soft nontender no rebound guarding. Several scars from previous trocar as a result of laparoscopic myomectomy in the past. Pelvic:BUS within normal limits Vagina: No gross lesions on inspection Cervix: No gross lesions on inspection Uterus: 10-12 weeks size irregular shaped Adnexa: Limited bimanual exam see ultrasound report Extremities: No cords, no edema Rectal: No palpable masses  Patient underwent an endometrial biopsy today after the cervix was cleansed with Betadine solution and a Pipelle was introduced into the uterine cavity. The uterus sounded to 7 cm. Moderate amount of tissue was obtained and submitted for histological evaluation result pending at time of this dictation.  No results found for this or any previous visit (from the past 24 hour(s)).  No results found.  Assessment/Plan: Patient with symptomatic leiomyomatous uteri and iron deficiency anemia. She received Lupron 3.75 mg IM at last office visit. She is currently on iron supplementation 3 tablets daily. Patient scheduled at the end of the month for a total abdominal hysterectomy with ovarian conservation. As a result of the large fibroid near the cervix she would not be an ideal candidate for laparoscopic hysterectomy due to placement of instrumentation. Patient previously had been provided with literature information on the operation and the risks benefits and pros and cons were discussed.                   Patient was counseled as to the risk of surgery to include the following:  1. Infection  (prohylactic antibiotics will be administered)  2. DVT/Pulmonary Embolism (prophylactic pneumo compression stockings will be used)  3.Trauma to internal organs requiring additional surgical procedure to repair any injury to     Internal organs requiring perhaps additional hospitalization days.  4.Hemmorhage requiring transfusion and blood products which carry risks such as  anaphylactic reaction, hepatitis and AIDS  Patient is fully aware that after the operation she will no longer be able to have children and she fully accepts as well. Patient scheduled to see Dr. Jones her primary physician a few days before her surgery since her TSH was elevated recently tested in the office since she had not been taking her medication. We'll wait for his medical clearance before her surgery. Will last and to check her CBC as well to see if her hemoglobin has improved which was 9.8 on March 25.  Patient had received literature information on the procedure scheduled and all her questions were answered and we'll follow accordingly.  Venancio Chenier HMD5:30 PMTD@   Roiza Wiedel H 09/28/2011, 5:20 PM   

## 2011-09-28 NOTE — Patient Instructions (Signed)
Hysterectomy Information  A hysterectomy is a procedure where your uterus is surgically removed. It will no longer be possible to have menstrual periods or to become pregnant. The tubes and ovaries can be removed (bilateral salpingo-oopherectomy) during this surgery as well.  REASONS FOR A HYSTERECTOMY  Persistent, abnormal bleeding.   Lasting (chronic) pelvic pain or infection.   The lining of the uterus (endometrium) starts growing outside the uterus (endometriosis).   The endometrium starts growing in the muscle of the uterus (adenomyosis).   The uterus falls down into the vagina (pelvic organ prolapse).   Symptomatic uterine fibroids.   Precancerous cells.   Cervical cancer or uterine cancer.  TYPES OF HYSTERECTOMIES  Supracervical hysterectomy. This type removes the top part of the uterus, but not the cervix.   Total hysterectomy. This type removes the uterus and cervix.   Radical hysterectomy. This type removes the uterus, cervix, and the fibrous tissue that holds the uterus in place in the pelvis (parametrium).  WAYS A HYSTERECTOMY CAN BE PERFORMED  Abdominal hysterectomy. A large surgical cut (incision) is made in the abdomen. The uterus is removed through this incision.   Vaginal hysterectomy. An incision is made in the vagina. The uterus is removed through this incision. There are no abdominal incisions.   Conventional laparoscopic hysterectomy. A thin, lighted tube with a camera (laparoscope) is inserted into 3 or 4 small incisions in the abdomen. The uterus is cut into small pieces. The small pieces are removed through the incisions, or they are removed through the vagina.   Laparoscopic assisted vaginal hysterectomy (LAVH). Three or four small incisions are made in the abdomen. Part of the surgery is performed laparoscopically and part vaginally. The uterus is removed through the vagina.   Robot-assisted laparoscopic hysterectomy. A laparoscope is inserted into 3 or 4  small incisions in the abdomen. A computer-controlled device is used to give the surgeon a 3D image. This allows for more precise movements of surgical instruments. The uterus is cut into small pieces and removed through the incisions or removed through the vagina.  RISKS OF HYSTERECTOMY   Bleeding and risk of blood transfusion. Tell your caregiver if you do not want to receive any blood products.   Blood clots in the legs or lung.   Infection.   Injury to surrounding organs.   Anesthesia problems or side effects.   Conversion to an abdominal hysterectomy.  WHAT TO EXPECT AFTER A HYSTERECTOMY  You will be given pain medicine.   You will need to have someone with you for the first 3 to 5 days after you go home.   You will need to follow up with your surgeon in 2 to 4 weeks after surgery to evaluate your progress.   You may have early menopause symptoms like hot flashes, night sweats, and insomnia.   If you had a hysterectomy for a problem that was not a cancer or a condition that could lead to cancer, then you no longer need Pap tests. However, even if you no longer need a Pap test, a regular exam is a good idea to make sure no other problems are starting.  Document Released: 11/23/2000 Document Revised: 05/19/2011 Document Reviewed: 01/08/2011 [employer] Patient Information 2012 [employer]. 

## 2011-10-05 ENCOUNTER — Encounter: Payer: Self-pay | Admitting: Internal Medicine

## 2011-10-05 ENCOUNTER — Encounter (HOSPITAL_COMMUNITY)
Admission: RE | Admit: 2011-10-05 | Discharge: 2011-10-05 | Disposition: A | Discharge status: Home / Self Care | Payer: BC Managed Care – PPO | Source: Ambulatory Visit | Attending: Gynecology | Admitting: Gynecology

## 2011-10-05 ENCOUNTER — Other Ambulatory Visit (INDEPENDENT_AMBULATORY_CARE_PROVIDER_SITE_OTHER): Payer: BC Managed Care – PPO

## 2011-10-05 ENCOUNTER — Encounter (HOSPITAL_COMMUNITY): Payer: Self-pay

## 2011-10-05 ENCOUNTER — Ambulatory Visit (INDEPENDENT_AMBULATORY_CARE_PROVIDER_SITE_OTHER): Payer: BC Managed Care – PPO | Admitting: Internal Medicine

## 2011-10-05 VITALS — BP 138/76 | HR 73 | Temp 98.8°F | Resp 16 | Wt 238.0 lb

## 2011-10-05 DIAGNOSIS — E039 Hypothyroidism, unspecified: Secondary | ICD-10-CM

## 2011-10-05 DIAGNOSIS — M79604 Pain in right leg: Secondary | ICD-10-CM | POA: Insufficient documentation

## 2011-10-05 DIAGNOSIS — N83209 Unspecified ovarian cyst, unspecified side: Secondary | ICD-10-CM

## 2011-10-05 DIAGNOSIS — D649 Anemia, unspecified: Secondary | ICD-10-CM

## 2011-10-05 DIAGNOSIS — N83202 Unspecified ovarian cyst, left side: Secondary | ICD-10-CM

## 2011-10-05 DIAGNOSIS — N83201 Unspecified ovarian cyst, right side: Secondary | ICD-10-CM

## 2011-10-05 DIAGNOSIS — M545 Low back pain, unspecified: Secondary | ICD-10-CM

## 2011-10-05 HISTORY — DX: Hypothyroidism, unspecified: E03.9

## 2011-10-05 HISTORY — DX: Anxiety disorder, unspecified: F41.9

## 2011-10-05 HISTORY — DX: Dizziness and giddiness: R42

## 2011-10-05 HISTORY — DX: Other seasonal allergic rhinitis: J30.2

## 2011-10-05 LAB — FERRITIN: Ferritin: 15.9 ng/mL (ref 10.0–291.0)

## 2011-10-05 LAB — BASIC METABOLIC PANEL
BUN: 10 mg/dL (ref 6–23)
Calcium: 9.9 mg/dL (ref 8.4–10.5)
GFR calc Af Amer: >90 mL/min (ref >90)
GFR calc non Af Amer: >90 mL/min (ref >90)
Glucose, Bld: 90 mg/dL (ref 70–99)
Sodium: 139 mEq/L (ref 135–145)

## 2011-10-05 LAB — IBC PANEL: Saturation Ratios: 9.6 % — ABNORMAL LOW (ref 20.0–50.0)

## 2011-10-05 LAB — CBC WITH DIFFERENTIAL/PLATELET
Basophils Absolute: 0.1 10*3/uL (ref 0.0–0.1)
Hemoglobin: 10.3 g/dL — ABNORMAL LOW (ref 12.0–15.0)
Lymphocytes Relative: 34.7 % (ref 12.0–46.0)
Lymphs Abs: 2.4 10*3/uL (ref 0.7–4.0)
MCHC: 32.7 g/dL (ref 30.0–36.0)
MCV: 82.7 fl (ref 78.0–100.0)
Monocytes Absolute: 0.5 10*3/uL (ref 0.1–1.0)
RBC: 3.8 Mil/uL — ABNORMAL LOW (ref 3.87–5.11)
WBC: 7 10*3/uL (ref 4.5–10.5)

## 2011-10-05 LAB — URINALYSIS, ROUTINE W REFLEX MICROSCOPIC
Glucose, UA: NEGATIVE mg/dL
Ketones, ur: NEGATIVE mg/dL
Leukocytes, UA: NEGATIVE
Nitrite: NEGATIVE
Protein, ur: NEGATIVE mg/dL
pH: 5.5 (ref 5.0–8.0)

## 2011-10-05 LAB — SURGICAL PCR SCREEN: MRSA, PCR: NEGATIVE

## 2011-10-05 MED ORDER — LEVOTHYROXINE SODIUM 175 MCG PO TABS: 175.0000 ug | ORAL_TABLET | Freq: Every day | ORAL | Status: DC

## 2011-10-05 MED ORDER — BUPRENORPHINE 20 MCG/HR TD PTWK: 1.0000 | MEDICATED_PATCH | TRANSDERMAL | Status: DC

## 2011-10-05 NOTE — Patient Instructions (Signed)
Iron Deficiency Anemia There are many types of anemia. Iron deficiency anemia is the most common. Iron deficiency anemia is a decrease in the number of red blood cells caused by too little iron. Without enough iron, your body does not produce enough hemoglobin. Hemoglobin is a substance in red blood cells that carries oxygen to the body's tissues. Iron deficiency anemia may leave you tired and short of breath. CAUSES   Lack of iron in the diet.   This may be seen in infants and children, because there is little iron in milk.   This may be seen in adults who do not eat enough iron-rich foods.   This may be seen in pregnant or breastfeeding women who do not take iron supplements. There is a much higher need for iron intake at these times.   Poor absorption of iron, as seen with intestinal disorders.   Intestinal bleeding.   Heavy periods.  SYMPTOMS  Mild anemia may not be noticeable. Symptoms may include:  Fatigue.   Headache.   Pale skin.   Weakness.   Shortness of breath.   Dizziness.   Cold hands and feet.   Fast or irregular heartbeat.  DIAGNOSIS  Diagnosis requires a thorough evaluation and physical exam by your caregiver.  Blood tests are generally used to confirm iron deficiency anemia.   Additional tests may be done to find the underlying cause of your anemia. These may include:   Testing for blood in the stool (fecal occult blood test).   A procedure to see inside the colon and rectum (colonoscopy).   A procedure to see inside the esophagus and stomach (endoscopy).  TREATMENT   Correcting the cause of the iron deficiency is the first step.   Medicines, such as oral contraceptives, can make heavy menstrual flows lighter.   Antibiotics and other medicines can be used to treat peptic ulcers.   Surgery may be needed to remove a bleeding polyp, tumor, or fibroid.   Often, iron supplements (ferrous sulfate) are taken.   For the best iron absorption, take  these supplements with an empty stomach.   You may need to take the supplements with food if you cannot tolerate them on an empty stomach. Vitamin C improves the absorption of iron. Your caregiver may recommend taking your iron tablets with a glass of orange juice or vitamin C supplement.   Milk and antacids should not be taken at the same time as iron supplements. They may interfere with the absorption of iron.   Iron supplements can cause constipation. A stool softener is often recommended.   Pregnant and breastfeeding women will need to take extra iron, because their normal diet usually will not provide the required amount.   Patients who cannot tolerate iron by mouth can take it through a vein (intravenously) or by an injection into the muscle.  HOME CARE INSTRUCTIONS   Ask your dietitian for help with diet questions.   Take iron and vitamins as directed by your caregiver.   Eat a diet rich in iron. Eat liver, lean beef, whole-grain bread, eggs, dried fruit, and dark green leafy vegetables.  SEEK IMMEDIATE MEDICAL CARE IF:   You have a fainting episode. Do not drive yourself. Call your local emergency services (911 in [place] if no other help is available.   You have chest pain, nausea, or vomiting.   You develop severe or increased shortness of breath with activities.   You develop weakness or increased thirst.   You have   a rapid heartbeat.   You develop unexplained sweating or become lightheaded when getting up from a chair or bed.  MAKE SURE YOU:   Understand these instructions.   Will watch your condition.   Will get help right away if you are not doing well or get worse.  Document Released: 05/27/2000 Document Revised: 05/19/2011 Document Reviewed: 10/06/2009 [facility] Patient Information 2012 [place], [place].Back Pain, Adult Low back pain is very common. About 1 in 5 people have back pain.The cause of low back pain is rarely dangerous. The pain often gets better over  time.About half of people with a sudden onset of back pain feel better in just 2 weeks. About 8 in 10 people feel better by 6 weeks.  CAUSES Some common causes of back pain include:  Strain of the muscles or ligaments supporting the spine.   Wear and tear (degeneration) of the spinal discs.   Arthritis.   Direct injury to the back.  DIAGNOSIS Most of the time, the direct cause of low back pain is not known.However, back pain can be treated effectively even when the exact cause of the pain is unknown.Answering your caregiver's questions about your overall health and symptoms is one of the most accurate ways to make sure the cause of your pain is not dangerous. If your caregiver needs more information, he or she may order lab work or imaging tests (X-rays or MRIs).However, even if imaging tests show changes in your back, this usually does not require surgery. HOME CARE INSTRUCTIONS For many people, back pain returns.Since low back pain is rarely dangerous, it is often a condition that people can learn to [facility] their own.   Remain active. It is stressful on the back to sit or stand in one place. Do not sit, drive, or stand in one place for more than 30 minutes at a time. Take short walks on level surfaces as soon as pain allows.Try to increase the length of time you walk each day.   Do not stay in bed.Resting more than 1 or 2 days can delay your recovery.   Do not avoid exercise or work.Your body is made to move.It is not dangerous to be active, even though your back may hurt.Your back will likely heal faster if you return to being active before your pain is gone.   Pay attention to your body when you bend and lift. Many people have less discomfortwhen lifting if they bend their knees, keep the load close to their bodies,and avoid twisting. Often, the most comfortable positions are those that put less stress on your recovering back.   Find a comfortable position to sleep. Use a  firm mattress and lie on your side with your knees slightly bent. If you lie on your back, put a pillow under your knees.   Only take over-the-counter or prescription medicines as directed by your caregiver. Over-the-counter medicines to reduce pain and inflammation are often the most helpful.Your caregiver may prescribe muscle relaxant drugs.These medicines help dull your pain so you can more quickly return to your normal activities and healthy exercise.   Put ice on the injured area.   Put ice in a plastic bag.   Place a towel between your skin and the bag.   Leave the ice on for 15 to 20 minutes, 3 to 4 times a day for the first 2 to 3 days. After that, ice and heat may be alternated to reduce pain and spasms.   Ask your caregiver about trying  back exercises and gentle massage. This may be of some benefit.   Avoid feeling anxious or stressed.Stress increases muscle tension and can worsen back pain.It is important to recognize when you are anxious or stressed and learn ways to manage it.Exercise is a great option.  SEEK MEDICAL CARE IF:  You have pain that is not relieved with rest or medicine.   You have pain that does not improve in 1 week.   You have new symptoms.   You are generally not feeling well.  SEEK IMMEDIATE MEDICAL CARE IF:   You have pain that radiates from your back into your legs.   You develop new bowel or bladder control problems.   You have unusual weakness or numbness in your arms or legs.   You develop nausea or vomiting.   You develop abdominal pain.   You feel faint.  Document Released: 05/30/2005 Document Revised: 05/19/2011 Document Reviewed: 10/18/2010 [facility] Patient Information 2012 [place], [place].

## 2011-10-05 NOTE — Assessment & Plan Note (Signed)
She is having a hard time keeping up with the q6hrs dosing of hydrocodone so I offered her butrans patch and she can continue taking the hydrocodone as needed

## 2011-10-05 NOTE — Assessment & Plan Note (Signed)
Her anemia has improved and her iron level is rising, she is cleared for surgery from my standpoint

## 2011-10-05 NOTE — Assessment & Plan Note (Signed)
Her TSH is a little low so I lowered the dose of her synthroid

## 2011-10-05 NOTE — Progress Notes (Signed)
Subjective:    Patient ID: Lynn Martinez, female    DOB: 1971/04/07, 41 y.o.   MRN: [id number]  Anemia Presents for follow-up visit. Symptoms include light-headedness and malaise/fatigue. There has been no abdominal pain, anorexia, bruising/bleeding easily, confusion, fever, leg swelling, pallor, palpitations, paresthesias, pica or weight loss. Signs of blood loss that are present include menorrhagia and vaginal bleeding. Signs of blood loss that are not present include hematemesis, hematochezia and melena. There are no compliance problems.   Thyroid Problem Presents for follow-up visit. Symptoms include dry skin, fatigue and menstrual problem. Patient reports no anxiety, cold intolerance, constipation, depressed mood, diaphoresis, diarrhea, hair loss, heat intolerance, hoarse voice, leg swelling, nail problem, palpitations, tremors, visual change, weight gain or weight loss. The symptoms have been worsening.  Back Pain This is a chronic problem. The current episode started more than 1 year ago. The problem occurs intermittently. The problem is unchanged. The pain is present in the lumbar spine. The quality of the pain is described as aching. The pain radiates to the right thigh. The pain is at a severity of 6/10. The pain is mild. The pain is worse during the day. The symptoms are aggravated by bending. Pertinent negatives include no abdominal pain, bladder incontinence, bowel incontinence, chest pain, dysuria, fever, headaches, leg pain, numbness, paresis, paresthesias, pelvic pain, perianal numbness, tingling, weakness or weight loss. Treatments tried: hydrocodone. The treatment provided moderate relief.      Review of Systems  Constitutional: Positive for malaise/fatigue and fatigue. Negative for fever, chills, weight loss, weight gain, diaphoresis, activity change, appetite change and unexpected weight change.  HENT: Negative.  Negative for hoarse voice.   Eyes: Negative.     Respiratory: Negative for apnea, cough, choking, chest tightness, shortness of breath and stridor.   Cardiovascular: Negative for chest pain, palpitations and leg swelling.  Gastrointestinal: Negative for nausea, vomiting, abdominal pain, diarrhea, constipation, blood in stool, melena, hematochezia, abdominal distention, anal bleeding, rectal pain, anorexia, hematemesis and bowel incontinence.  Genitourinary: Positive for vaginal bleeding, menstrual problem and menorrhagia. Negative for bladder incontinence, dysuria and pelvic pain.  Musculoskeletal: Positive for back pain. Negative for myalgias, joint swelling, arthralgias and gait problem.  Skin: Negative for color change, pallor, rash and wound.  Neurological: Positive for light-headedness. Negative for dizziness, tingling, tremors, seizures, syncope, facial asymmetry, speech difficulty, weakness, numbness, headaches and paresthesias.  Hematological: Negative for cold intolerance, heat intolerance and adenopathy. Does not bruise/bleed easily.  Psychiatric/Behavioral: Negative.  Negative for suicidal ideas, hallucinations, behavioral problems, confusion, sleep disturbance, self-injury, dysphoric mood, decreased concentration and agitation. The patient is not nervous/anxious and is not hyperactive.        Objective:   Physical Exam  Vitals reviewed. Constitutional: She is oriented to person, place, and time. She appears well-developed and well-nourished. No distress.  HENT:  Head: Normocephalic and atraumatic.  Mouth/Throat: Oropharynx is clear and moist. Mucous membranes are pale, not dry and not cyanotic. No oropharyngeal exudate, posterior oropharyngeal edema, posterior oropharyngeal erythema or tonsillar abscesses.  Eyes: Conjunctivae are normal. Right eye exhibits no discharge. Left eye exhibits no discharge. No scleral icterus.  Neck: Normal range of motion. Neck supple. No JVD present. No tracheal deviation present. No thyromegaly  present.  Cardiovascular: Normal rate, regular rhythm, normal heart sounds and intact distal pulses.  Exam reveals no gallop and no friction rub.   No murmur heard. Pulmonary/Chest: Effort normal and breath sounds normal. No stridor. No respiratory distress. She has no wheezes. She has no rales.  She exhibits no tenderness.  Abdominal: Soft. Bowel sounds are normal. She exhibits no distension and no mass. There is no tenderness. There is no rebound and no guarding.  Musculoskeletal: Normal range of motion. She exhibits no edema and no tenderness.       Lumbar back: Normal. She exhibits normal range of motion, no tenderness, no bony tenderness, no swelling, no edema, no deformity, no laceration, no pain, no spasm and normal pulse.  Lymphadenopathy:    She has no cervical adenopathy.  Neurological: She is oriented to person, place, and time.  Skin: Skin is warm and dry. No rash noted. She is not diaphoretic. No erythema. No pallor.  Psychiatric: She has a normal mood and affect. Her behavior is normal. Judgment and thought content normal.     Lab Results  Component Value Date   WBC 8.3 09/05/2011   HGB 9.8* 09/05/2011   HCT 31.7* 09/05/2011   PLT 503* 09/05/2011   GLUCOSE 98 05/24/2011   ALT 14 08/13/2010   AST 18 08/13/2010   NA 142 05/24/2011   K 4.0 05/24/2011   CL 105 05/24/2011   CREATININE 1.20* 05/24/2011   BUN 17 05/24/2011   CO2 28 08/13/2010   TSH 91.429* 09/05/2011       Assessment & Plan:

## 2011-10-05 NOTE — Assessment & Plan Note (Signed)
Her cysts have been painful so she was given an Rx for [employer]

## 2011-10-05 NOTE — Pre-Procedure Instructions (Signed)
Patient had CBC w/diff drawn today at her primary care MD at [facility] - Dr Sanda Linger

## 2011-10-05 NOTE — Patient Instructions (Signed)
   Your procedure is scheduled on: Tuesday, April 30th  Enter through the [employer] [facility] at: [employer] up the phone at the desk and dial [phone] and inform us of your arrival.  Please call this number if you have any problems the morning of surgery: [phone]  Remember: Do not eat food after midnight: Monday Do not drink clear liquids after: Monday Take these medicines the morning of surgery with a SIP OF WATER: prozac, synthroid, metoprolol  Do not wear jewelry, make-up, or FINGER nail polish Do not wear lotions, powders, perfumes or deodorant. Do not shave 48 hours prior to surgery. Do not bring valuables to the hospital. Contacts, dentures or bridgework may not be worn into surgery.  Leave suitcase in the car. After Surgery it may be brought to your room. For patients being admitted to the hospital, checkout time is 11:00am the day of discharge.  Home with friend Lynn Martinez  Patients discharged on the day of surgery will not be allowed to drive home.     Remember to use your hibiclens as instructed.Please shower with 1/2 bottle the evening before your surgery and the other 1/2 bottle the morning of surgery. Neck down avoiding private area.

## 2011-10-10 MED ORDER — CIPROFLOXACIN IN D5W 400 MG/200ML IV SOLN
400.0000 mg | INTRAVENOUS | Status: DC
  Filled 2011-10-10: qty 200

## 2011-10-10 MED ORDER — CLINDAMYCIN PHOSPHATE 900 MG/50ML IV SOLN
900.0000 mg | INTRAVENOUS | Status: DC
  Filled 2011-10-10: qty 50

## 2011-10-11 ENCOUNTER — Encounter (HOSPITAL_COMMUNITY)
Admission: RE | Disposition: A | Discharge status: Home / Self Care | Payer: Self-pay | Source: Ambulatory Visit | Attending: Gynecology

## 2011-10-11 ENCOUNTER — Inpatient Hospital Stay (HOSPITAL_COMMUNITY): Payer: BC Managed Care – PPO | Admitting: Anesthesiology

## 2011-10-11 ENCOUNTER — Encounter (HOSPITAL_COMMUNITY): Payer: Self-pay | Admitting: *Deleted

## 2011-10-11 ENCOUNTER — Inpatient Hospital Stay (HOSPITAL_COMMUNITY)
Admission: RE | Admit: 2011-10-11 | Discharge: 2011-10-13 | DRG: 359 | Disposition: A | Discharge status: Home / Self Care | Payer: BC Managed Care – PPO | Source: Ambulatory Visit | Attending: Gynecology | Admitting: Gynecology

## 2011-10-11 ENCOUNTER — Encounter (HOSPITAL_COMMUNITY): Payer: Self-pay | Admitting: Anesthesiology

## 2011-10-11 DIAGNOSIS — D250 Submucous leiomyoma of uterus: Principal | ICD-10-CM | POA: Diagnosis present

## 2011-10-11 DIAGNOSIS — D251 Intramural leiomyoma of uterus: Secondary | ICD-10-CM | POA: Diagnosis present

## 2011-10-11 DIAGNOSIS — N920 Excessive and frequent menstruation with regular cycle: Secondary | ICD-10-CM

## 2011-10-11 DIAGNOSIS — E039 Hypothyroidism, unspecified: Secondary | ICD-10-CM

## 2011-10-11 DIAGNOSIS — N8000 Endometriosis of the uterus, unspecified: Secondary | ICD-10-CM | POA: Diagnosis present

## 2011-10-11 DIAGNOSIS — N736 Female pelvic peritoneal adhesions (postinfective): Secondary | ICD-10-CM | POA: Diagnosis present

## 2011-10-11 DIAGNOSIS — D252 Subserosal leiomyoma of uterus: Secondary | ICD-10-CM | POA: Diagnosis present

## 2011-10-11 DIAGNOSIS — N946 Dysmenorrhea, unspecified: Secondary | ICD-10-CM | POA: Diagnosis present

## 2011-10-11 DIAGNOSIS — IMO0002 Reserved for concepts with insufficient information to code with codable children: Secondary | ICD-10-CM | POA: Diagnosis present

## 2011-10-11 DIAGNOSIS — D509 Iron deficiency anemia, unspecified: Secondary | ICD-10-CM | POA: Diagnosis present

## 2011-10-11 DIAGNOSIS — N83209 Unspecified ovarian cyst, unspecified side: Secondary | ICD-10-CM | POA: Diagnosis present

## 2011-10-11 HISTORY — PX: ABDOMINAL HYSTERECTOMY: SHX81

## 2011-10-11 SURGERY — HYSTERECTOMY, ABDOMINAL
Anesthesia: General | Site: Abdomen | Wound class: Dirty or Infected

## 2011-10-11 MED ORDER — NEOSTIGMINE METHYLSULFATE 1 MG/ML IJ SOLN
INTRAMUSCULAR | Status: DC | PRN
  Administered 2011-10-11: 3 mg via INTRAVENOUS

## 2011-10-11 MED ORDER — ACETAMINOPHEN 325 MG PO TABS: 325.0000 mg | ORAL_TABLET | ORAL | Status: DC | PRN

## 2011-10-11 MED ORDER — DIPHENHYDRAMINE HCL 12.5 MG/5ML PO ELIX
12.5000 mg | ORAL_SOLUTION | Freq: Four times a day (QID) | ORAL | Status: DC | PRN
  Administered 2011-10-11: 12.5 mg via ORAL
  Filled 2011-10-11: qty 5

## 2011-10-11 MED ORDER — BUPIVACAINE HCL (PF) 0.5 % IJ SOLN
INTRAMUSCULAR | Status: AC
  Filled 2011-10-11: qty 270

## 2011-10-11 MED ORDER — FENTANYL CITRATE 0.05 MG/ML IJ SOLN
INTRAMUSCULAR | Status: DC | PRN
  Administered 2011-10-11: 100 ug via INTRAVENOUS
  Administered 2011-10-11: 150 ug via INTRAVENOUS

## 2011-10-11 MED ORDER — HYDROMORPHONE HCL PF 1 MG/ML IJ SOLN
INTRAMUSCULAR | Status: DC | PRN
  Administered 2011-10-11: 2 mg via INTRAVENOUS

## 2011-10-11 MED ORDER — LIDOCAINE HCL (CARDIAC) 20 MG/ML IV SOLN
INTRAVENOUS | Status: AC
  Filled 2011-10-11: qty 5

## 2011-10-11 MED ORDER — DEXAMETHASONE SODIUM PHOSPHATE 4 MG/ML IJ SOLN
INTRAMUSCULAR | Status: DC | PRN
  Administered 2011-10-11: 10 mg via INTRAVENOUS

## 2011-10-11 MED ORDER — PROMETHAZINE HCL 25 MG/ML IJ SOLN: 6.2500 mg | INTRAMUSCULAR | Status: DC | PRN

## 2011-10-11 MED ORDER — MIDAZOLAM HCL 2 MG/2ML IJ SOLN
INTRAMUSCULAR | Status: AC
  Filled 2011-10-11: qty 2

## 2011-10-11 MED ORDER — NALOXONE HCL 0.4 MG/ML IJ SOLN: 0.4000 mg | INTRAMUSCULAR | Status: DC | PRN

## 2011-10-11 MED ORDER — BUPIVACAINE HCL (PF) 0.25 % IJ SOLN
INTRAMUSCULAR | Status: DC | PRN
  Administered 2011-10-11: 10 mL

## 2011-10-11 MED ORDER — LACTATED RINGERS IV SOLN
INTRAVENOUS | Status: DC
  Administered 2011-10-11 (×5): via INTRAVENOUS

## 2011-10-11 MED ORDER — HYDROMORPHONE HCL PF 1 MG/ML IJ SOLN
INTRAMUSCULAR | Status: AC
  Filled 2011-10-11: qty 1

## 2011-10-11 MED ORDER — LEVOTHYROXINE SODIUM 175 MCG PO TABS
175.0000 ug | ORAL_TABLET | Freq: Every day | ORAL | Status: DC
  Administered 2011-10-12 – 2011-10-13 (×2): 175 ug via ORAL
  Filled 2011-10-11 (×4): qty 1

## 2011-10-11 MED ORDER — GLYCOPYRROLATE 0.2 MG/ML IJ SOLN
INTRAMUSCULAR | Status: AC
  Filled 2011-10-11: qty 1

## 2011-10-11 MED ORDER — MEPERIDINE HCL 25 MG/ML IJ SOLN: 6.2500 mg | INTRAMUSCULAR | Status: DC | PRN

## 2011-10-11 MED ORDER — BUPIVACAINE HCL (PF) 0.25 % IJ SOLN
INTRAMUSCULAR | Status: AC
  Filled 2011-10-11: qty 30

## 2011-10-11 MED ORDER — CLINDAMYCIN PHOSPHATE 600 MG/50ML IV SOLN
INTRAVENOUS | Status: DC | PRN
  Administered 2011-10-11: 900 mg via INTRAVENOUS

## 2011-10-11 MED ORDER — HYDROMORPHONE 0.3 MG/ML IV SOLN
INTRAVENOUS | Status: DC
  Administered 2011-10-11: 15 mg via INTRAVENOUS
  Administered 2011-10-11: 4.8 mg via INTRAVENOUS
  Administered 2011-10-11 – 2011-10-12 (×2): via INTRAVENOUS
  Administered 2011-10-12: 4.5 mg via INTRAVENOUS
  Administered 2011-10-12: 0.3 mg via INTRAVENOUS
  Filled 2011-10-11 (×2): qty 25

## 2011-10-11 MED ORDER — ONDANSETRON HCL 4 MG/2ML IJ SOLN
INTRAMUSCULAR | Status: AC
  Filled 2011-10-11: qty 2

## 2011-10-11 MED ORDER — ROCURONIUM BROMIDE 50 MG/5ML IV SOLN
INTRAVENOUS | Status: AC
  Filled 2011-10-11: qty 1

## 2011-10-11 MED ORDER — GLYCOPYRROLATE 0.2 MG/ML IJ SOLN
INTRAMUSCULAR | Status: DC | PRN
  Administered 2011-10-11: 0.4 mg via INTRAVENOUS

## 2011-10-11 MED ORDER — MIDAZOLAM HCL 2 MG/2ML IJ SOLN: 0.5000 mg | Freq: Once | INTRAMUSCULAR | Status: DC | PRN

## 2011-10-11 MED ORDER — CIPROFLOXACIN IN D5W 400 MG/200ML IV SOLN
INTRAVENOUS | Status: DC | PRN
  Administered 2011-10-11: 400 mg via INTRAVENOUS

## 2011-10-11 MED ORDER — DIPHENHYDRAMINE HCL 50 MG/ML IJ SOLN: 12.5000 mg | Freq: Four times a day (QID) | INTRAMUSCULAR | Status: DC | PRN

## 2011-10-11 MED ORDER — MICROFIBRILLAR COLL HEMOSTAT EX PADS
MEDICATED_PAD | CUTANEOUS | Status: DC | PRN
  Administered 2011-10-11: 1 via TOPICAL

## 2011-10-11 MED ORDER — ONDANSETRON HCL 4 MG/2ML IJ SOLN: 4.0000 mg | Freq: Four times a day (QID) | INTRAMUSCULAR | Status: DC | PRN

## 2011-10-11 MED ORDER — ROCURONIUM BROMIDE 100 MG/10ML IV SOLN
INTRAVENOUS | Status: DC | PRN
  Administered 2011-10-11: 5 mg via INTRAVENOUS
  Administered 2011-10-11: 40 mg via INTRAVENOUS

## 2011-10-11 MED ORDER — PROPOFOL 10 MG/ML IV EMUL
INTRAVENOUS | Status: DC | PRN
  Administered 2011-10-11: 200 mg via INTRAVENOUS

## 2011-10-11 MED ORDER — LACTATED RINGERS IV SOLN
INTRAVENOUS | Status: DC
  Administered 2011-10-11 (×2): via INTRAVENOUS

## 2011-10-11 MED ORDER — FENTANYL CITRATE 0.05 MG/ML IJ SOLN
INTRAMUSCULAR | Status: AC
  Filled 2011-10-11: qty 5

## 2011-10-11 MED ORDER — DEXAMETHASONE SODIUM PHOSPHATE 10 MG/ML IJ SOLN
INTRAMUSCULAR | Status: AC
  Filled 2011-10-11: qty 1

## 2011-10-11 MED ORDER — HYDROMORPHONE HCL PF 1 MG/ML IJ SOLN
0.2500 mg | INTRAMUSCULAR | Status: DC | PRN
  Administered 2011-10-11 (×2): 0.5 mg via INTRAVENOUS

## 2011-10-11 MED ORDER — SODIUM CHLORIDE 0.9 % IJ SOLN: 9.0000 mL | INTRAMUSCULAR | Status: DC | PRN

## 2011-10-11 MED ORDER — LIDOCAINE HCL (CARDIAC) 20 MG/ML IV SOLN
INTRAVENOUS | Status: DC | PRN
  Administered 2011-10-11: 80 mg via INTRAVENOUS

## 2011-10-11 MED ORDER — MIDAZOLAM HCL 5 MG/5ML IJ SOLN
INTRAMUSCULAR | Status: DC | PRN
  Administered 2011-10-11: 2 mg via INTRAVENOUS

## 2011-10-11 MED ORDER — NEOSTIGMINE METHYLSULFATE 1 MG/ML IJ SOLN
INTRAMUSCULAR | Status: AC
  Filled 2011-10-11: qty 10

## 2011-10-11 MED ORDER — PROPOFOL 10 MG/ML IV EMUL
INTRAVENOUS | Status: AC
  Filled 2011-10-11: qty 20

## 2011-10-11 SURGICAL SUPPLY — 55 items
ATCH SMKEVC FLXB CAUT HNDSWH (FILTER) IMPLANT
BARRIER ADHS 3X4 INTERCEED (GAUZE/BANDAGES/DRESSINGS) IMPLANT
BLADE SURG CLIPPER 3M 9600 (MISCELLANEOUS) ×1 IMPLANT
BRR ADH 4X3 ABS CNTRL BYND (GAUZE/BANDAGES/DRESSINGS)
CANISTER SUCTION 2500CC (MISCELLANEOUS) ×3 IMPLANT
CATH KIT ON Q 5IN DUAL SLV (PAIN MANAGEMENT) IMPLANT
CELLS DAT CNTRL 66122 CELL SVR (MISCELLANEOUS) IMPLANT
CLOTH BEACON ORANGE TIMEOUT ST (SAFETY) ×2 IMPLANT
COVER LIGHT HANDLE  1/PK (MISCELLANEOUS) ×1
COVER LIGHT HANDLE 1/PK (MISCELLANEOUS) IMPLANT
DECANTER SPIKE VIAL GLASS SM (MISCELLANEOUS) IMPLANT
DRESSING TELFA 8X3 (GAUZE/BANDAGES/DRESSINGS) ×1 IMPLANT
DRSG TEGADERM 2.38X2.75 (GAUZE/BANDAGES/DRESSINGS) IMPLANT
DRSG XEROFORM 1X8 (GAUZE/BANDAGES/DRESSINGS) ×2 IMPLANT
EVACUATOR SMOKE ACCUVAC VALLEY (FILTER) ×1
GAUZE SPONGE 4X4 12PLY STRL LF (GAUZE/BANDAGES/DRESSINGS) ×3 IMPLANT
GAUZE SPONGE 4X4 16PLY XRAY LF (GAUZE/BANDAGES/DRESSINGS) ×1 IMPLANT
GLOVE BIOGEL PI IND STRL 8 (GLOVE) ×1 IMPLANT
GLOVE BIOGEL PI INDICATOR 8 (GLOVE) ×1
GLOVE ECLIPSE 7.5 STRL STRAW (GLOVE) ×4 IMPLANT
GOWN PREVENTION PLUS LG XLONG (DISPOSABLE) ×6 IMPLANT
HEMOSTAT SURGICEL 2X3 (HEMOSTASIS) ×1 IMPLANT
NDL HYPO 25X1 1.5 SAFETY (NEEDLE) IMPLANT
NEEDLE HYPO 25X1 1.5 SAFETY (NEEDLE) IMPLANT
NS IRRIG 1000ML POUR BTL (IV SOLUTION) ×6 IMPLANT
PACK ABDOMINAL GYN (CUSTOM PROCEDURE TRAY) ×2 IMPLANT
PAD OB MATERNITY 4.3X12.25 (PERSONAL CARE ITEMS) ×2 IMPLANT
PROTECTOR NERVE ULNAR (MISCELLANEOUS) ×2 IMPLANT
RETAINER VISCERAL (MISCELLANEOUS) ×1 IMPLANT
RETRACTOR WND ALEXIS 18 MED (MISCELLANEOUS) IMPLANT
RETRACTOR WND ALEXIS 25 LRG (MISCELLANEOUS) IMPLANT
RTRCTR WOUND ALEXIS 18CM MED (MISCELLANEOUS)
RTRCTR WOUND ALEXIS 25CM LRG (MISCELLANEOUS)
SPONGE LAP 18X18 X RAY DECT (DISPOSABLE) ×5 IMPLANT
STAPLER VISISTAT 35W (STAPLE) ×2 IMPLANT
STRIP CLOSURE SKIN 1/2X4 (GAUZE/BANDAGES/DRESSINGS) IMPLANT
SUT CHROMIC 3 0 SH 27 (SUTURE) IMPLANT
SUT SILK 4 0 SH CR/8 (SUTURE) ×1 IMPLANT
SUT VIC AB 0 CT1 18XCR BRD8 (SUTURE) ×3 IMPLANT
SUT VIC AB 0 CT1 36 (SUTURE) ×2 IMPLANT
SUT VIC AB 0 CT1 8-18 (SUTURE) ×6
SUT VIC AB 1 CT1 18XBRD ANBCTR (SUTURE) IMPLANT
SUT VIC AB 1 CT1 8-18 (SUTURE)
SUT VIC AB 3-0 CT1 27 (SUTURE) ×4
SUT VIC AB 3-0 CT1 TAPERPNT 27 (SUTURE) ×2 IMPLANT
SUT VIC AB 3-0 SH 27 (SUTURE) ×2
SUT VIC AB 3-0 SH 27X BRD (SUTURE) ×1 IMPLANT
SUT VICRYL 0 TIES 12 18 (SUTURE) ×2 IMPLANT
SUT VICRYL 3 0 BR 18  UND (SUTURE)
SUT VICRYL 3 0 BR 18 UND (SUTURE) IMPLANT
SYR CONTROL 10ML LL (SYRINGE) IMPLANT
TAPE CLOTH SURG 4X10 WHT LF (GAUZE/BANDAGES/DRESSINGS) ×1 IMPLANT
TOWEL OR 17X24 6PK STRL BLUE (TOWEL DISPOSABLE) ×4 IMPLANT
TRAY FOLEY CATH 14FR (SET/KITS/TRAYS/PACK) ×2 IMPLANT
WATER STERILE IRR 1000ML POUR (IV SOLUTION) ×2 IMPLANT

## 2011-10-11 NOTE — Anesthesia Postprocedure Evaluation (Signed)
  Anesthesia Post-op Note  Patient: Lynn Martinez  Procedure(s) Performed: Procedure(s) (LRB): HYSTERECTOMY ABDOMINAL (N/A)  Patient Location: PACU  Anesthesia Type: General  Level of Consciousness: awake, alert  and oriented  Airway and Oxygen Therapy: Patient Spontanous Breathing  Post-op Pain: mild  Post-op Assessment: Post-op Vital signs reviewed, Patient's Cardiovascular Status Stable, Respiratory Function Stable, Patent Airway, No signs of Nausea or vomiting and Pain level controlled  Post-op Vital Signs: Reviewed and stable  Complications: No apparent anesthesia complications

## 2011-10-11 NOTE — OR Nursing (Signed)
Serosa repair of bowel @ [phone] per Dr. Lily Peer.

## 2011-10-11 NOTE — Transfer of Care (Signed)
Immediate Anesthesia Transfer of Care Note  Patient: Lynn Martinez  Procedure(s) Performed: Procedure(s) (LRB): HYSTERECTOMY ABDOMINAL (N/A)  Patient Location: PACU  Anesthesia Type: General  Level of Consciousness: sedated  Airway & Oxygen Therapy: Patient Spontanous Breathing and Patient connected to nasal cannula oxygen  Post-op Assessment: Report given to PACU RN and Post -op Vital signs reviewed and stable  Post vital signs: stable  Complications: none

## 2011-10-11 NOTE — H&P (View-Only) (Signed)
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 Patient has a history of a robotic laparoscopic myomectomy in 2010 at [facility]. She moved to [facility] saw another provider in 2011 and had a sonohysterogram and a thought that she had some intracavitary lesion and was recommended to undergo a D&C but did not follow through due to lack of insurance and limited financial resources. Patient stated this since January she spotted between periods. She states her cycles are heavy the first 5 days but total days they may last from 7-14 days. Patient currently not sexually active but when she has been in the past she has complained of dyspareunia. Patient states she had a normal Pap smear March of 2011 and in 2013. Patient denies any prior history of any abnormal Pap smear or any STDs. She had seen another provider in the community recently had a fingerstick hemoglobin of 10.4 for which she's currently taking iron 3 times daily. Patient history of PCO S.  Patient suffers from depression for which she's currently on Prozac. She has a history of thyroid cancer treated with iodine 131 and subsequent total thyroidectomy for which she's currently now on Synthroid 200 mcg daily. Her primary physician is Dr. Jonny Ruiz at [facility] and she has not had any thyroid function test in a year. Her internist had her on Lopressor 25 mg daily which she currently takes for hypertension.  As a result of her heavy periods she has missed quite a lot of days of work. Patient weighs 240 pounds has BMI of 38.45 an ultrasound was ordered with the following results:   Uterus measures 10.2 x 9.8 x 11.4 cm with endometrial stripe of 15.7 mm (patient currently on day 21 of her cycle). Uterus was enlarged  with subserous and intramural myomas bearing in size with the largest one measuring 6.6 x 6.2 cm and one posteriorly located adjacent to the cervix measuring 25 x 23 mm. A right thick wall cyst avascular measuring 29 x 23 mm was noted. Left ovary was normal. Some fluid was noted in the cul-de-sac    Pertinent Gynecological History: Menses: Spotting throughout the month and heavy periods lasting 7-10 days. Bleeding: intermenstrual bleeding Contraception: none DES exposure: denies Blood transfusions: none Sexually transmitted diseases: no past history Previous GYN Procedures: None  Last mammogram: Not indicated Date: Not indicated Last pap: normal Date: 2013 OB History: G 0, P 0   Menstrual History: Menarche age: 26 Patient's last menstrual period was 08/20/2011.    Past Medical History  Diagnosis Date  . Depression   . Hypertension   . Cancer 2005    THYROID  . Fibroid   . PMS (premenstrual syndrome)   . Hormone disorder   . PCOS (polycystic ovarian syndrome)   . Grave's disease   . Memory loss   . Anemia     Past Surgical History  Procedure Date  . Thyroidectomy   . Myomectomy 2010    LAPAROSCOPY  . Umbilical hernia repair 1982    Family History  Problem Relation Age of Onset  . Thyroid disease Mother   . Diabetes Father   . Hypertension Father   . Heart disease Father   . Cancer Maternal Aunt     BRAIN  . Cancer Maternal Grandmother  LYMPHOMA  . Hypertension Maternal Grandmother   . Cancer Paternal Grandmother     PANCREATIC  . Diabetes Paternal Grandmother   . Hypertension Paternal Grandmother   . Hypertension Maternal Grandfather   . Hypertension Paternal Grandfather     Social History:  reports that she has never smoked. She does not have any smokeless tobacco history on file. She reports that she drinks alcohol. She reports that she does not use illicit drugs.  Allergies:  Allergies  Allergen Reactions  . Aspirin   . Codeine Itching     Tolerates Hydrocodone OK.  . Ibuprofen   . Morphine   . Penicillins      (Not in a hospital admission)  @ROS @  Blood pressure 128/86, last menstrual period 08/20/2011.  Physical Exam:  HEENT:unremarkable Neck:Supple, midline, no thyroid megaly, no carotid bruits Lungs:  Clear to auscultation no rhonchi's or wheezes Heart:Regular rate and rhythm, no murmurs or gallops Breast Exam: Not recently examined Abdomen: Soft nontender no rebound guarding. Several scars from previous trocar as a result of laparoscopic myomectomy in the past. Pelvic:BUS within normal limits Vagina: No gross lesions on inspection Cervix: No gross lesions on inspection Uterus: 10-12 weeks size irregular shaped Adnexa: Limited bimanual exam see ultrasound report Extremities: No cords, no edema Rectal: No palpable masses  Patient underwent an endometrial biopsy today after the cervix was cleansed with Betadine solution and a Pipelle was introduced into the uterine cavity. The uterus sounded to 7 cm. Moderate amount of tissue was obtained and submitted for histological evaluation result pending at time of this dictation.  No results found for this or any previous visit (from the past 24 hour(s)).  No results found.  Assessment/Plan: Patient with symptomatic leiomyomatous uteri and iron deficiency anemia. She received Lupron 3.75 mg IM at last office visit. She is currently on iron supplementation 3 tablets daily. Patient scheduled at the end of the month for a total abdominal hysterectomy with ovarian conservation. As a result of the large fibroid near the cervix she would not be an ideal candidate for laparoscopic hysterectomy due to placement of instrumentation. Patient previously had been provided with literature information on the operation and the risks benefits and pros and cons were discussed.                   Patient was counseled as to the risk of surgery to include the following:  1. Infection  (prohylactic antibiotics will be administered)  2. DVT/Pulmonary Embolism (prophylactic pneumo compression stockings will be used)  3.Trauma to internal organs requiring additional surgical procedure to repair any injury to     Internal organs requiring perhaps additional hospitalization days.  4.Hemmorhage requiring transfusion and blood products which carry risks such as  anaphylactic reaction, hepatitis and AIDS  Patient is fully aware that after the operation she will no longer be able to have children and she fully accepts as well. Patient scheduled to see Dr. Yetta Barre her primary physician a few days before her surgery since her TSH was elevated recently tested in the office since she had not been taking her medication. We'll wait for his medical clearance before her surgery. Will last and to check her CBC as well to see if her hemoglobin has improved which was 9.8 on March 25.  Patient had received literature information on the procedure scheduled and all her questions were answered and we'll follow accordingly.  [facility] HMD5:30 PMTD@   Ok Edwards 09/28/2011, 5:20 PM

## 2011-10-11 NOTE — Interval H&P Note (Signed)
History and Physical Interval Note:  10/11/2011 7:16 AM  Lynn Martinez L Caldwell-Lewis  has presented today for surgery, with the diagnosis of dysmenorrhea, menorrhagia  The various methods of treatment have been discussed with the patient and family. After consideration of risks, benefits and other options for treatment, the patient has consented to  Procedure(s) (LRB): HYSTERECTOMY ABDOMINAL (N/A) as a surgical intervention .  The patients' history has been reviewed, patient examined, no change in status, stable for surgery.  I have reviewed the patients' chart and labs.  Questions were answered to the patient's satisfaction.     Ok Edwards

## 2011-10-11 NOTE — Anesthesia Preprocedure Evaluation (Addendum)
Anesthesia Evaluation  Patient identified by MRN, date of birth, ID band Patient awake    Reviewed: Allergy & Precautions, H&P , Patient's Chart, lab work & pertinent test results, reviewed documented beta blocker date and time   History of Anesthesia Complications Negative for: history of anesthetic complications  Airway Mallampati: III TM Distance: >3 FB Neck ROM: full    Dental No notable dental hx.    Pulmonary neg pulmonary ROS,  breath sounds clear to auscultation  Pulmonary exam normal       Cardiovascular Exercise Tolerance: Good hypertension, negative cardio ROS  Rhythm:regular Rate:Normal     Neuro/Psych  Headaches, negative neurological ROS  negative psych ROS   GI/Hepatic negative GI ROS, Neg liver ROS,   Endo/Other  negative endocrine ROSHypothyroidism Hyperthyroidism Morbid obesity  Renal/GU negative Renal ROS     Musculoskeletal   Abdominal   Peds  Hematology negative hematology ROS (+)   Anesthesia Other Findings Depression     Hypertension        Cancer 2005 THYROID Fibroid        PMS (premenstrual syndrome)     Hormone disorder        PCOS (polycystic ovarian syndrome)     Grave's disease        Memory loss   "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 Anemia        Seasonal allergies     Hypothyroidism        Headache   history - otc med prn Anxiety        Vertigo        Reproductive/Obstetrics negative OB ROS                          Anesthesia Physical Anesthesia Plan  ASA: III  Anesthesia Plan: General ETT   Post-op Pain Management:    Induction:   Airway Management Planned:   Additional Equipment:   Intra-op Plan:   Post-operative Plan:   Informed Consent: I have reviewed the patients History and Physical, chart, labs and discussed the procedure including the risks, benefits and alternatives for the proposed anesthesia with the patient or  authorized representative who has indicated his/her understanding and acceptance.   Dental Advisory Given  Plan Discussed with: CRNA and Surgeon  Anesthesia Plan Comments:        Anesthesia Quick Evaluation

## 2011-10-11 NOTE — Op Note (Signed)
10/11/2011  3:07 PM  PATIENT:  Lynn Martinez  41 y.o. female  PRE-OPERATIVE DIAGNOSIS:  symptomatic leiomyomatous uteri with right ovarian cyst  POST-OPERATIVE DIAGNOSIS:  symptomatic leiomyomatous uteri with right ovarian cyst. Extensive abdominal pelvic adhesions  PROCEDURE:  Procedure(s) 1. exploratory laparotomy 2. Abdominal pelvic adhesive lysis extensive 3. Total abdominal hysterectomy    SURGEON:  Surgeon(s): Ok Edwards, MD Dara Lords, MD  ANESTHESIA:   general  FINDINGS: Extensive abdominal pelvic adhesions. Sigmoid colon adhered to the posterior fundus and lower uterine segment of the uterus. Normal-appearing tubes and ovaries.  DESCRIPTION OF OPERATION: After the patient underwent successful general endotracheal anesthesia the abdomen vagina and perineum were prepped and draped in usual sterile fashion patient had PAS stockings for DVT prophylaxis and received 1 g of Cefotan for prophylaxis as well. A Pfannenstiel incision was made 2 cm above the symphysis pubis the incision was carried out on the skin down to subcutaneous tissue were by midline nick was made and the fascia was incised in a transverse fashion. The peritoneal cavity was entered cautiously. A modified Maylard muscle-splitting technique was utilized on the abdominal side rectus muscle to allow exposure and to safely free some of the small bowel adhered to the peritoneum. The patient was then placed in the Trendelenburg position the O'Connor-O'Sullivan retractors were then placed. Extensive abdominopelvic adhesive lysis was required to meticulously free the sigmoid colon from the fundus and the lower uterine segment of the posterior uterus. Once this was accomplished the right round ligament was identified and secured with a 0 Vicryl suture and transected. The bladder peritoneum was freed off of the lower uterine segment with fine-tip Metzenbaum scissors. The right utero-ovarian ligament and  proximal fallopian tube were clamped and cut and suture tied with 0 Vicryl suture. The remaining broad and cardinal ligaments were sterilely clamped cut and suture ligated with 0 Vicryl suture to the level the right vaginal fornix. Similar procedure was carried out on the contralateral side. With 2 curved Heaneys placed on the right and left vaginal fornix respectively the cervix was excised from the fornix and passed off the operative field. The vaginal cuff angles were secured with 0 Vicryl suture in a transfixation manner. The remainder of the vaginal cuff was closed with 0 Vicryl suture in a figure-of-eight manner as well. The pelvic cavity was: Was irrigated with normal saline solution. A 4 cm segment of the distal sigmoid colon that had been adhered to the fundus of the uterus required reapproximation of the serosa with a running stitch of 4-0 silk. Surgicel was placed in the area of the vaginal cuff after the area was copious irrigated with normal saline solution and ascertaining adequate hemostasis. The sponge count and needle count were correct the O'Connor-O'Sullivan retractors were removed. The visceral peritoneum was not reapproximated. The rectus fascia was closed with a running stitch of 0 Vicryl suture. The subcutaneous bleeders were Bovie cauterized. The skin was reapproximated with skin clips. 0.25% Marcaine was infiltrated subcutaneously at the Pfannenstiel incision site for postoperative analgesia for a total of 10 cc. Xeroform gauze was then placed along with a pressure dressing. The patient was extubated and transferred to recovery in stable vital signs blood loss was 250 cc and urine output 100 cc.  ESTIMATED BLOOD LOSS: 250 cc  Intake/Output Summary (Last 24 hours) at 10/11/11 1507 Last data filed at 10/11/11 1427  Gross per 24 hour  Intake   4630 ml  Output    900 ml  Net   3730 ml     BLOOD ADMINISTERED:none   LOCAL MEDICATIONS USED:  MARCAINE   0.25% subcutaneous Pfannenstiel  incision site for a total of 10 cc  SPECIMEN:  Source of Specimen:  Uterus and cervix  DISPOSITION OF SPECIMEN:  PATHOLOGY  COUNTS:  YES  PLAN OF CARE: Transfer to PACU  [facility] HMD3:07 PMTD@

## 2011-10-12 ENCOUNTER — Encounter (HOSPITAL_COMMUNITY): Payer: Self-pay | Admitting: Gynecology

## 2011-10-12 LAB — CBC
MCH: 25.9 pg — ABNORMAL LOW (ref 26.0–34.0)
MCHC: 31.3 g/dL (ref 30.0–36.0)
Platelets: 335 10*3/uL (ref 150–400)
RDW: 15.8 % — ABNORMAL HIGH (ref 11.5–15.5)

## 2011-10-12 MED ORDER — CHLOROPROCAINE HCL 3 % IJ SOLN
INTRAMUSCULAR | Status: AC
  Filled 2011-10-12: qty 20

## 2011-10-12 MED ORDER — MEPERIDINE HCL 50 MG/ML IJ SOLN: 50.0000 mg | INTRAMUSCULAR | Status: DC | PRN

## 2011-10-12 MED ORDER — HYDROMORPHONE HCL 2 MG PO TABS
2.0000 mg | ORAL_TABLET | ORAL | Status: DC | PRN
  Administered 2011-10-12 – 2011-10-13 (×4): 2 mg via ORAL
  Filled 2011-10-12 (×6): qty 1

## 2011-10-12 NOTE — Progress Notes (Signed)
Patient ID: Lynn Martinez, female   DOB: [birth year], 41 y.o.   MRN: [id number]   Postop day #1 status post total abdominal hysterectomy for symptomatic leiomyomatous uteri (dysmenorrhea, menorrhagia, and anemia).   Subjective: Patient reports incisional pain.    Objective: I have reviewed patient's vital signs.  vital signs, intake and output, medications and labs.  BP 1100/53 Temp. 98 Pulse 78 Resp 18  I/O last 3 completed shifts: In: 7262.9 [P.O.:570; I.V.:6692.9] Out: 3650 [Urine:3400; Blood:250] Total I/O In: 330 [P.O.:330] Out: 150 [Urine:150]  Results for orders placed during the hospital encounter of 10/11/11 (from the past 24 hour(s))  CBC     Status: Abnormal   Collection Time   10/12/11  5:10 AM      Component Value Range   WBC 7.1  4.0 - 10.5 (K/uL)   RBC 3.13 (*) 3.87 - 5.11 (MIL/uL)   Hemoglobin 8.1 (*) 12.0 - 15.0 (g/dL)   HCT 13.0 (*) 86.5 - 46.0 (%)   MCV 82.7  78.0 - 100.0 (fL)   MCH 25.9 (*) 26.0 - 34.0 (pg)   MCHC 31.3  30.0 - 36.0 (g/dL)   RDW 78.4 (*) 69.6 - 15.5 (%)   Platelets 335  150 - 400 (K/uL)    EXAM General: alert and cooperative Resp: clear to auscultation bilaterally Cardio: regular rate and rhythm, S1, S2 normal, no murmur, click, rub or gallop GI: soft, non-tender; bowel sounds normal; no masses,  no organomegaly Extremities: extremities normal, atraumatic, no cyanosis or edema Vaginal Bleeding: none  Assessment: s/p Procedure(s): HYSTERECTOMY ABDOMINAL: Patient did well last p.m. Urine output was good and clear. Vital signs are stable and afebrile Patient ambulated last night,  although her hemoglobin was 8.1 this morning she was not orthostatic and her pulse was 80 today. We'll wait to see how the rest of the day goes and if she becomes symptomatic will obtain orthostatic vital signs to see if an argument can be made to transfuse her 2 units of blood. I did discuss with her the potential risk of anaphylactic reaction  hepatitis and AIDS she would rather not receive a transfusion unless she becomes symptomatic   Plan: Encourage ambulation Advance to PO medication Clear liquids Orthostatic vital signs   LOS: 1 day    Ok Edwards, MD 10/12/2011 8:19 AM    10/12/2011, 8:19 AM

## 2011-10-13 MED ORDER — HYDROMORPHONE HCL 2 MG PO TABS
2.0000 mg | ORAL_TABLET | ORAL | Status: AC | PRN
Start: 2011-10-13 — End: 2011-10-23

## 2011-10-13 NOTE — Anesthesia Postprocedure Evaluation (Signed)
  Anesthesia Post-op Note  Patient: Lynn Martinez  Procedure(s) Performed: Procedure(s) (LRB): HYSTERECTOMY ABDOMINAL (N/A)  Patient Location: 309   Anesthesia Type: General  Level of Consciousness: awake, alert  and oriented  Airway and Oxygen Therapy: Patient Spontanous Breathing  Post-op Pain: mild  Post-op Assessment: Post-op Vital signs reviewed and Patient's Cardiovascular Status Stable  Post-op Vital Signs: Reviewed and stable  Complications: No apparent anesthesia complications

## 2011-10-13 NOTE — Discharge Summary (Signed)
Physician Discharge Summary  Patient ID: Lynn Martinez MRN: [id number] DOB/AGE: February 25, 1971 41 y.o.  Admit date: 10/11/2011 Discharge date: 10/13/2011  Admission Diagnoses:  Discharge Diagnoses:  Active Problems:  * No active hospital problems. *    Discharged Condition: good  Hospital Course patient was to the hospital on May 1 is where she underwent exploratory laparotomy with extensive abdominal pelvic adhesions lysis and a total abdominal hysterectomy. Postoperatively she had a PCA pump and for the first 24 hour she had a Foley catheter which were both discontinued shortly thereafter. She was up and ambulating and tolerating regular diet well and was passing flatus and in stable vital signs. Prior to her surgery as a result of her symptomatic leiomyomatous uteri her hemoglobin was 10.3 g and postop was 8.1. Orthostatic blood pressure and pulse readings were stable. Patient asymptomatic ambulating. Patient with past history of hypertension was normotensive during hospitalization and her antihypertensive medication were placed on hold until discharge from hospital. Pathology report as follows:   Uterus and cervix - CERVIX: UNREMARKABLE. - ENDOMETRIUM: SECRETORY WITH BENIGN ENDOMETRIAL POLYP. NO HYPERPLASIA OR CARCINOMA. - MYOMETRIUM: ADENOMYOSIS. LEIOMYOMATA. - UTERINE SEROSA: FIBROUS ADHESIONS. NO ENDOMETRIOSIS OR MALIGNANCY.  Patient was rated be discharged home on her second postoperative day and she will return to the office next week to have her staples removed. Prescription provided. She will start her R. supplementation next week.  Consults: None  Significant Diagnostic Studies: labs: Hgb 8.1 HCT 25.9  Treatments: surgery: TAH  Discharge Exam: Blood pressure 121/72, pulse 78, temperature 98.4 F (36.9 C), temperature source Oral, resp. rate 18, height 5\' 7"  (1.702 m), weight 238 lb (107.956 kg), SpO2 99.00%. General appearance: alert and cooperative HEENT  unremarkable Heart regular rate and rhythm abd soft non tender pos BS incision intact Ext. No cord or edema  Disposition: 01-Home or Self Care  Discharge Orders    Future Orders Please Complete By Expires   Resume previous diet      Driving Restrictions      Comments:   No driving for 1 weeks   Lifting restrictions      Comments:   No lifting for 6  weeks   Call MD for:  temperature >100.5      Call MD for:  redness, tenderness, or signs of infection (pain, swelling, bleeding, redness, odor or green/yellow discharge around incision site)      Call MD for:  severe or increased pain, loss or decreased feeling  in affected limb(s)      Discharge instructions      Comments:   Post op appointment: Monday May 6 at 9:00 am to  Remove staples     Medication List  As of 10/13/2011  8:25 AM   STOP taking these medications         Bisacodyl-PEG-KCl-NaBicar-NaCl 5-210 MG-GM kit      buprenorphine 20 MCG/HR Ptwk         TAKE these medications         acetaminophen 325 MG tablet   Commonly known as: TYLENOL   Take 650 mg by mouth as needed. pain      cholecalciferol 1000 UNITS tablet   Commonly known as: VITAMIN D   Take 1,000 Units by mouth daily.      cyanocobalamin 1000 MCG/ML injection   Commonly known as: (VITAMIN B-12)   Inject 1 mL (1,000 mcg total) into the muscle every 30 (thirty) days.      ferrous sulfate 325 (65  FE) MG tablet   Take 325 mg by mouth daily with breakfast.      FLUoxetine 20 MG tablet   Commonly known as: PROZAC   Take 20 mg by mouth daily.      HYDROmorphone 2 MG tablet   Commonly known as: DILAUDID   Take 1 tablet (2 mg total) by mouth every 4 (four) hours as needed.         ASK your doctor about these medications         leuprolide 3.75 MG injection   Commonly known as: LUPRON   Inject 3.75 mg into the muscle every 28 (twenty-eight) days.      levothyroxine 175 MCG tablet   Commonly known as: SYNTHROID, LEVOTHROID   Take 1 tablet (175  mcg total) by mouth daily.      megestrol 40 MG tablet   Commonly known as: MEGACE   Take 1 tablet (40 mg total) by mouth 2 (two) times daily.      metoprolol 50 MG tablet   Commonly known as: LOPRESSOR   Take 25 mg by mouth 2 (two) times daily.      SYRINGE/NEEDLE (DISP) 1 ML 25G X 5/8" 1 ML Misc   Use as directed with b12 injection once monthly      VITAMIN B1-B12 IM   Inject into the muscle.             SignedOk Edwards 10/13/2011, 8:25 AM

## 2011-10-13 NOTE — Addendum Note (Signed)
Addendum  created 10/13/11 1025 by Graciela Husbands, CRNA   Modules edited:Notes Section

## 2011-10-13 NOTE — Progress Notes (Signed)
Patient ID: Lynn Martinez, female   DOB: 1971-01-06, 41 y.o.   MRN: [id number]   Subjective: Patient reports tolerating PO, + flatus and no problems voiding.    Objective: I have reviewed patient's vital signs.  vital signs, intake and output, medications and labs.  Filed Vitals:   10/13/11 0536  BP: 121/72  Pulse: 78  Temp: 98.4 F (36.9 C)  Resp: 18   I/O last 3 completed shifts: In: 4342.9 [P.O.:1950; I.V.:2392.9] Out: 3975 [Urine:3975]    No results found for this or any previous visit (from the past 24 hour(s)).  EXAM General: alert and cooperative Resp: clear to auscultation bilaterally Cardio: regular rate and rhythm, S1, S2 normal, no murmur, click, rub or gallop GI: soft, non-tender; bowel sounds normal; no masses,  no organomegaly Extremities: extremities normal, atraumatic, no cyanosis or edema Vaginal Bleeding: none incision intact  Assessment: s/p Procedure(s): HYSTERECTOMY ABDOMINAL: stable, progressing well and tolerating diet  Plan: Discharge home  LOS: 2 days    Ok Edwards, MD 10/13/2011 8:20 AM    10/13/2011, 8:20 AM

## 2011-10-17 ENCOUNTER — Ambulatory Visit (INDEPENDENT_AMBULATORY_CARE_PROVIDER_SITE_OTHER): Payer: BC Managed Care – PPO | Admitting: Gynecology

## 2011-10-17 ENCOUNTER — Encounter: Payer: Self-pay | Admitting: Gynecology

## 2011-10-17 VITALS — BP 130/88

## 2011-10-17 DIAGNOSIS — Z9889 Other specified postprocedural states: Secondary | ICD-10-CM

## 2011-10-17 NOTE — Progress Notes (Signed)
Patient 41 year old who presented to the office to have her staples removed from her Pfannenstiel incision. She is status post exploratory laparotomy with abdominal pelvic adhesive lysis along with total abdominal hysterectomy for symptomatic leiomyomatous uteri. Patient is doing well. At time of discharge on hospital hemoglobin was 8.1. She was never orthostatic. Pathology report as follows:  Diagnosis Uterus and cervix - CERVIX: UNREMARKABLE. - ENDOMETRIUM: SECRETORY WITH BENIGN ENDOMETRIAL POLYP. NO HYPERPLASIA OR CARCINOMA. - MYOMETRIUM: ADENOMYOSIS. LEIOMYOMATA. - UTERINE SEROSA: FIBROUS ADHESIONS. NO ENDOMETRIOSIS OR MALIGNA  Pfannenstiel incision was intact and staples were removed. She will return to the office in 2 weeks for postop appointment. She will be asked to start her blood pressure medication today. And she'll start her iron tablet one tablet daily this week and then begin with next week 1 tablet twice a day.

## 2011-10-17 NOTE — Patient Instructions (Signed)
Your pathology report was benign. You are doing great! Start your blood pressure medication today. I would like for you to start taking one iron tablet daily this week and starting next week take one twice a day. Once we get your levels back to normal will stop the iron. We will check your iron level at 6 weeks.

## 2011-10-20 ENCOUNTER — Encounter: Payer: Self-pay | Admitting: Gynecology

## 2011-11-02 ENCOUNTER — Ambulatory Visit (INDEPENDENT_AMBULATORY_CARE_PROVIDER_SITE_OTHER): Payer: BC Managed Care – PPO | Admitting: Gynecology

## 2011-11-02 ENCOUNTER — Encounter: Payer: Self-pay | Admitting: Gynecology

## 2011-11-02 VITALS — BP 134/86

## 2011-11-02 DIAGNOSIS — Z9889 Other specified postprocedural states: Secondary | ICD-10-CM

## 2011-11-02 MED ORDER — METOPROLOL TARTRATE 50 MG PO TABS: 25.0000 mg | ORAL_TABLET | Freq: Two times a day (BID) | ORAL | Status: DC

## 2011-11-02 MED ORDER — DOCUSATE SODIUM 100 MG PO CAPS: 100.0000 mg | ORAL_CAPSULE | Freq: Two times a day (BID) | ORAL | Status: AC

## 2011-11-02 NOTE — Progress Notes (Signed)
Patient presented to the office today for her three-week postop visit. Patient status post exploratory laparotomy with abdominal pelvic adhesive lysis and total abdominal hysterectomy for symptomatic leiomyomatous uteri. Pathology report as follows:  Diagnosis  Uterus and cervix  - CERVIX: UNREMARKABLE.  - ENDOMETRIUM: SECRETORY WITH BENIGN ENDOMETRIAL POLYP. NO HYPERPLASIA OR  CARCINOMA.  - MYOMETRIUM: ADENOMYOSIS. LEIOMYOMATA.  - UTERINE SEROSA: FIBROUS ADHESIONS. NO ENDOMETRIOSIS OR MALIGNA  Patient prior to her surgery had a hemoglobin 10.3 and on discharge from the hospital it was 8.1 but she was hemodynamically stable and is currently on iron supplementation. Patient is doing well is asymptomatic states that she feels much better now than before her surgery.  Exam: Pfannenstiel incision intact abdomen soft nontender no rebound or guarding Bartholin urethra Skene glands: Within normal limits Vagina: Vaginal cuff intact Bimanual: No palpable masses or tenderness Rectal: Not examined  Assessment/plan: Patient 3 weeks status post total abdominal hysterectomy for symptomatic leiomyomatous uteri is doing well. 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 She will return back to the office in 3 weeks for final postop visit. She will continue her iron supplementation daily and will check her CBC which she returns. A prescription for Colace was provided as was a refill for one month on her metoprolol for hypertension.

## 2011-11-02 NOTE — Patient Instructions (Signed)
The stool softener is ready to be picked up at the pharmacy

## 2011-11-23 ENCOUNTER — Ambulatory Visit (INDEPENDENT_AMBULATORY_CARE_PROVIDER_SITE_OTHER): Payer: BC Managed Care – PPO | Admitting: Gynecology

## 2011-11-23 ENCOUNTER — Encounter: Payer: Self-pay | Admitting: Gynecology

## 2011-11-23 VITALS — BP 126/84

## 2011-11-23 DIAGNOSIS — Z9889 Other specified postprocedural states: Secondary | ICD-10-CM

## 2011-11-23 NOTE — Progress Notes (Signed)
Patient presented to the office today for her six-week postop visit. She is status post exploratory laparotomy with abdominal pelvic adhesion lysis and total abdominal hysterectomy for symptomatic leiomyomatous uteri. Patient is doing well with no complaints today. Patient is return back to work. Her final path report as follows was discussed with the patient:  Diagnosis  Uterus and cervix  - CERVIX: UNREMARKABLE.  - ENDOMETRIUM: SECRETORY WITH BENIGN ENDOMETRIAL POLYP. NO HYPERPLASIA OR  CARCINOMA.  - MYOMETRIUM: ADENOMYOSIS. LEIOMYOMATA.  - UTERINE SEROSA: FIBROUS ADHESIONS. NO ENDOMETRIOSIS OR MALIGNA  Exam: Abdomen soft nontender no rebound or guarding Pfannenstiel incision healed.  Pelvic: Bartholin urethra Skene was within normal limits Vagina: No lesions or discharge vaginal cuff intact Bimanual exam: No palpable masses or tenderness Rectal exam: Not done  Assessment/plan: Patient 6 weeks status post exploratory laparotomy with total abdominal hysterectomy and pelvic adhesion lysis doing well may resume to normal activity and we will see her back in one year or when necessary.

## 2011-12-02 ENCOUNTER — Encounter: Payer: Self-pay | Admitting: Internal Medicine

## 2011-12-02 ENCOUNTER — Other Ambulatory Visit (INDEPENDENT_AMBULATORY_CARE_PROVIDER_SITE_OTHER): Payer: BC Managed Care – PPO

## 2011-12-02 ENCOUNTER — Ambulatory Visit (INDEPENDENT_AMBULATORY_CARE_PROVIDER_SITE_OTHER): Payer: BC Managed Care – PPO | Admitting: Internal Medicine

## 2011-12-02 VITALS — BP 108/66 | HR 76 | Temp 98.2°F | Resp 16 | Wt 229.8 lb

## 2011-12-02 DIAGNOSIS — R0989 Other specified symptoms and signs involving the circulatory and respiratory systems: Secondary | ICD-10-CM

## 2011-12-02 DIAGNOSIS — R0683 Snoring: Secondary | ICD-10-CM | POA: Insufficient documentation

## 2011-12-02 DIAGNOSIS — R0609 Other forms of dyspnea: Secondary | ICD-10-CM

## 2011-12-02 DIAGNOSIS — E039 Hypothyroidism, unspecified: Secondary | ICD-10-CM

## 2011-12-02 DIAGNOSIS — D649 Anemia, unspecified: Secondary | ICD-10-CM

## 2011-12-02 LAB — IBC PANEL
Iron: 20 ug/dL — ABNORMAL LOW (ref 42–145)
Saturation Ratios: 5 % — ABNORMAL LOW (ref 20.0–50.0)
Transferrin: 282.9 mg/dL (ref 212.0–360.0)

## 2011-12-02 LAB — CBC WITH DIFFERENTIAL/PLATELET
Basophils Relative: 1.6 % (ref 0.0–3.0)
Eosinophils Relative: 1.9 % (ref 0.0–5.0)
Lymphocytes Relative: 30.8 % (ref 12.0–46.0)
MCV: 77 fl — ABNORMAL LOW (ref 78.0–100.0)
Monocytes Absolute: 0.5 10*3/uL (ref 0.1–1.0)
Monocytes Relative: 6.9 % (ref 3.0–12.0)
Neutrophils Relative %: 58.8 % (ref 43.0–77.0)
Platelets: 492 10*3/uL — ABNORMAL HIGH (ref 150.0–400.0)
RBC: 4.15 Mil/uL (ref 3.87–5.11)
WBC: 7.7 10*3/uL (ref 4.5–10.5)

## 2011-12-02 LAB — FERRITIN: Ferritin: 10.5 ng/mL (ref 10.0–291.0)

## 2011-12-02 MED ORDER — FLUOXETINE HCL 20 MG PO TABS: 20.0000 mg | ORAL_TABLET | Freq: Every day | ORAL | Status: DC

## 2011-12-02 MED ORDER — METOPROLOL TARTRATE 50 MG PO TABS: 25.0000 mg | ORAL_TABLET | Freq: Two times a day (BID) | ORAL | Status: DC

## 2011-12-02 NOTE — Assessment & Plan Note (Addendum)
I will check her TSH today and will adjust her dose if needed  Late note: TSH is suppressed so thyroid dose will be lowered

## 2011-12-02 NOTE — Assessment & Plan Note (Signed)
I have some suspicion that she may have OSA so I have asked her to be seen by sleep med

## 2011-12-02 NOTE — Progress Notes (Signed)
  Subjective:    Patient ID: Lynn Martinez, female    DOB: 09-26-1970, 41 y.o.   MRN: [id number]  Thyroid Problem Presents for follow-up visit. Symptoms include anxiety, fatigue and weight loss. Patient reports no cold intolerance, constipation, depressed mood, diaphoresis, diarrhea, dry skin, hair loss, heat intolerance, hoarse voice, leg swelling, menstrual problem, nail problem, palpitations, tremors, visual change or weight gain. The symptoms have been worsening.      Review of Systems  Constitutional: Positive for weight loss, fatigue and unexpected weight change (some weight loss). Negative for fever, chills, weight gain, diaphoresis, activity change and appetite change.  HENT: Negative.  Negative for hoarse voice.   Eyes: Negative.   Respiratory: Positive for apnea (and snoring). Negative for cough, choking, chest tightness, shortness of breath, wheezing and stridor.   Cardiovascular: Negative for chest pain, palpitations and leg swelling.  Gastrointestinal: Negative for nausea, vomiting, abdominal pain, diarrhea, constipation, blood in stool and abdominal distention.  Genitourinary: Negative for dysuria, frequency, flank pain, vaginal bleeding, vaginal discharge, enuresis, difficulty urinating, menstrual problem and dyspareunia.  Musculoskeletal: Negative for myalgias, back pain, joint swelling, arthralgias and gait problem.  Skin: Negative for color change, pallor, rash and wound.  Neurological: Negative for dizziness, tremors, seizures, syncope, facial asymmetry, speech difficulty, weakness, light-headedness, numbness and headaches.  Hematological: Negative for cold intolerance, heat intolerance and adenopathy. Does not bruise/bleed easily.  Psychiatric/Behavioral: Negative.        Objective:   Physical Exam  Vitals reviewed. Constitutional: She is oriented to person, place, and time. She appears well-developed and well-nourished. No distress.  HENT:  Head:  Normocephalic and atraumatic.  Mouth/Throat: Oropharynx is clear and moist. No oropharyngeal exudate.  Eyes: Conjunctivae are normal. Right eye exhibits no discharge. Left eye exhibits no discharge. No scleral icterus.  Neck: Normal range of motion. Neck supple. No JVD present. No tracheal deviation present. No thyromegaly present.  Cardiovascular: Normal rate, regular rhythm, normal heart sounds and intact distal pulses.  Exam reveals no gallop and no friction rub.   No murmur heard. Pulmonary/Chest: Effort normal and breath sounds normal. No stridor. No respiratory distress. She has no wheezes. She has no rales. She exhibits no tenderness.  Abdominal: Soft. Bowel sounds are normal. She exhibits no distension and no mass. There is no tenderness. There is no rebound and no guarding.  Musculoskeletal: Normal range of motion. She exhibits no edema and no tenderness.  Lymphadenopathy:    She has no cervical adenopathy.  Neurological: She is oriented to person, place, and time.  Skin: Skin is warm and dry. No rash noted. She is not diaphoretic. No erythema. No pallor.  Psychiatric: She has a normal mood and affect. Her behavior is normal. Judgment and thought content normal.      Lab Results  Component Value Date   WBC 7.1 10/12/2011   HGB 8.1* 10/12/2011   HCT 25.9* 10/12/2011   PLT 335 10/12/2011   GLUCOSE 90 10/05/2011   ALT 14 08/13/2010   AST 18 08/13/2010   NA 139 10/05/2011   K 4.1 10/05/2011   CL 104 10/05/2011   CREATININE 0.75 10/05/2011   BUN 10 10/05/2011   CO2 25 10/05/2011   TSH 0.25* 10/05/2011      Assessment & Plan:

## 2011-12-02 NOTE — Patient Instructions (Signed)

## 2011-12-02 NOTE — Assessment & Plan Note (Signed)
I will check her CBC and her iron level today 

## 2011-12-04 ENCOUNTER — Encounter: Payer: Self-pay | Admitting: Internal Medicine

## 2011-12-04 MED ORDER — LEVOTHYROXINE SODIUM 150 MCG PO TABS: 150.0000 ug | ORAL_TABLET | Freq: Every day | ORAL | Status: DC

## 2011-12-04 NOTE — Addendum Note (Signed)
Addended by: Etta Grandchild on: 12/04/2011 11:54 AM   Modules accepted: Orders

## 2011-12-09 ENCOUNTER — Ambulatory Visit (INDEPENDENT_AMBULATORY_CARE_PROVIDER_SITE_OTHER): Payer: BC Managed Care – PPO | Admitting: Internal Medicine

## 2011-12-09 ENCOUNTER — Encounter: Payer: Self-pay | Admitting: Internal Medicine

## 2011-12-09 VITALS — BP 128/64 | HR 58 | Temp 97.1°F | Resp 16 | Wt 232.5 lb

## 2011-12-09 DIAGNOSIS — E039 Hypothyroidism, unspecified: Secondary | ICD-10-CM

## 2011-12-09 DIAGNOSIS — D649 Anemia, unspecified: Secondary | ICD-10-CM

## 2011-12-09 NOTE — Progress Notes (Signed)
  Subjective:    Patient ID: Lynn Martinez, female    DOB: 12-30-1970, 41 y.o.   MRN: [id number]  Anemia Presents for follow-up visit. Symptoms include malaise/fatigue. There has been no abdominal pain, anorexia, bruising/bleeding easily, confusion, fever, leg swelling, light-headedness, pallor, palpitations, paresthesias, pica or weight loss. Signs of blood loss that are not present include hematemesis, hematochezia and melena. Compliance problems include psychosocial issues.  Compliance with medications is 0-25%.      Review of Systems  Constitutional: Positive for malaise/fatigue and fatigue. Negative for fever, chills, weight loss, diaphoresis, activity change, appetite change and unexpected weight change.  HENT: Negative.   Eyes: Negative.   Respiratory: Negative for apnea, cough, chest tightness, shortness of breath, wheezing and stridor.   Cardiovascular: Negative for chest pain, palpitations and leg swelling.  Gastrointestinal: Negative for nausea, vomiting, abdominal pain, diarrhea, constipation, blood in stool, melena, hematochezia, anorexia and hematemesis.  Genitourinary: Negative.   Musculoskeletal: Negative for myalgias, back pain, joint swelling, arthralgias and gait problem.  Skin: Negative for color change, pallor, rash and wound.  Neurological: Negative.  Negative for light-headedness and paresthesias.  Hematological: Negative for adenopathy. Does not bruise/bleed easily.  Psychiatric/Behavioral: Negative for confusion.       Objective:   Physical Exam  Vitals reviewed. Constitutional: She is oriented to person, place, and time. She appears well-developed and well-nourished. No distress.  HENT:  Head: Normocephalic and atraumatic.  Mouth/Throat: Oropharynx is clear and moist. No oropharyngeal exudate.  Eyes: Conjunctivae are normal. Right eye exhibits no discharge. Left eye exhibits no discharge. No scleral icterus.  Neck: Normal range of motion. Neck  supple. No JVD present. No tracheal deviation present. No thyromegaly present.  Cardiovascular: Normal rate, regular rhythm, normal heart sounds and intact distal pulses.  Exam reveals no gallop and no friction rub.   No murmur heard. Pulmonary/Chest: Effort normal and breath sounds normal. No stridor. No respiratory distress. She has no wheezes. She has no rales. She exhibits no tenderness.  Abdominal: Soft. Bowel sounds are normal. She exhibits no distension and no mass. There is no tenderness. There is no rebound and no guarding.  Musculoskeletal: Normal range of motion. She exhibits no edema and no tenderness.  Lymphadenopathy:    She has no cervical adenopathy.  Neurological: She is oriented to person, place, and time.  Skin: Skin is warm and dry. No rash noted. She is not diaphoretic. No erythema. No pallor.  Psychiatric: She has a normal mood and affect. Her behavior is normal. Judgment and thought content normal.      Lab Results  Component Value Date   WBC 7.7 12/02/2011   HGB 10.1* 12/02/2011   HCT 32.0* 12/02/2011   PLT 492.0* 12/02/2011   GLUCOSE 90 10/05/2011   ALT 14 08/13/2010   AST 18 08/13/2010   NA 139 10/05/2011   K 4.1 10/05/2011   CL 104 10/05/2011   CREATININE 0.75 10/05/2011   BUN 10 10/05/2011   CO2 25 10/05/2011   TSH 0.07* 12/02/2011      Assessment & Plan:

## 2011-12-11 ENCOUNTER — Encounter: Payer: Self-pay | Admitting: Internal Medicine

## 2011-12-11 NOTE — Assessment & Plan Note (Signed)
Synthroid dose has been lowered

## 2011-12-11 NOTE — Assessment & Plan Note (Signed)
She will restart her iron replacement

## 2012-01-05 ENCOUNTER — Encounter (HOSPITAL_COMMUNITY): Payer: Self-pay | Admitting: *Deleted

## 2012-01-05 ENCOUNTER — Emergency Department (HOSPITAL_COMMUNITY): Payer: Self-pay

## 2012-01-05 ENCOUNTER — Emergency Department (HOSPITAL_COMMUNITY)
Admission: EM | Admit: 2012-01-05 | Discharge: 2012-01-05 | Disposition: A | Discharge status: Home / Self Care | Payer: Self-pay | Attending: Emergency Medicine | Admitting: Emergency Medicine

## 2012-01-05 DIAGNOSIS — Z79899 Other long term (current) drug therapy: Secondary | ICD-10-CM | POA: Insufficient documentation

## 2012-01-05 DIAGNOSIS — Z85850 Personal history of malignant neoplasm of thyroid: Secondary | ICD-10-CM | POA: Insufficient documentation

## 2012-01-05 DIAGNOSIS — E039 Hypothyroidism, unspecified: Secondary | ICD-10-CM | POA: Insufficient documentation

## 2012-01-05 DIAGNOSIS — I10 Essential (primary) hypertension: Secondary | ICD-10-CM | POA: Insufficient documentation

## 2012-01-05 DIAGNOSIS — R05 Cough: Secondary | ICD-10-CM | POA: Insufficient documentation

## 2012-01-05 DIAGNOSIS — J029 Acute pharyngitis, unspecified: Secondary | ICD-10-CM | POA: Insufficient documentation

## 2012-01-05 DIAGNOSIS — R059 Cough, unspecified: Secondary | ICD-10-CM | POA: Insufficient documentation

## 2012-01-05 MED ORDER — ACETAMINOPHEN 500 MG PO TABS
1000.0000 mg | ORAL_TABLET | Freq: Once | ORAL | Status: AC
  Administered 2012-01-05: 1000 mg via ORAL
  Filled 2012-01-05: qty 2

## 2012-01-05 MED ORDER — AZITHROMYCIN 250 MG PO TABS: ORAL_TABLET | ORAL | Status: AC

## 2012-01-05 NOTE — ED Provider Notes (Signed)
Medical screening examination/treatment/procedure(s) were performed by non-physician practitioner and as supervising physician I was immediately available for consultation/collaboration.   Xzavier Swinger L Marvion Bastidas, MD 01/05/12 2323 

## 2012-01-05 NOTE — ED Provider Notes (Signed)
History     CSN: [id number]  Arrival date & time 01/05/12  2050   First MD Initiated Contact with Patient 01/05/12 2242      Chief Complaint  Patient presents with  . URI    (Consider location/radiation/quality/duration/timing/severity/associated sxs/prior treatment) Patient is a 41 y.o. female presenting with URI. The history is provided by the patient.  URI  Patient presents to emergency department complaining of a 48 hour history of gradual onset sore throat, cough, and chest tenderness with cough. Patient states that she's taken Tylenol for symptoms with only mild relief. Patient states "I just don't feel very well." Denies known sick contacts. She denies known fevers but has felt hot and cold. She denies difficulty breathing or swallowing. She denies abdominal pain, nausea, vomiting, diarrhea, dysuria, hematuria, or blood in her stool. Patient states she does have a primary care provider in [employer] that she can followup with closely. She denies aggravating or alleviating factors.  Past Medical History  Diagnosis Date  . Depression   . Hypertension   . Cancer 2005    THYROID  . Fibroid   . PMS (premenstrual syndrome)   . Hormone disorder   . PCOS (polycystic ovarian syndrome)   . Grave's disease   . 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 Anemia   . Seasonal allergies   . Hypothyroidism   . Headache     history - otc med prn  . Anxiety   . Vertigo     Past Surgical History  Procedure Date  . Thyroidectomy   . Myomectomy 2010    LAPAROSCOPY  . Umbilical hernia repair 1982    as child  . Diagnostic laparoscopy   . Abdominal hysterectomy 10/11/2011    Procedure: HYSTERECTOMY ABDOMINAL;  Surgeon: Ok Edwards, MD;  Location: [facility];  Service: Gynecology;  Laterality: N/A;  With Repair of serosa.    Family History  Problem Relation Age of Onset  . Thyroid disease Mother   . Diabetes Father   . Hypertension Father   . Heart  disease Father   . Cancer Maternal Aunt     BRAIN  . Cancer Maternal Grandmother     LYMPHOMA  . Hypertension Maternal Grandmother   . Cancer Paternal Grandmother     PANCREATIC  . Diabetes Paternal Grandmother   . Hypertension Paternal Grandmother   . Hypertension Maternal Grandfather   . Hypertension Paternal Grandfather     History  Substance Use Topics  . Smoking status: Never Smoker   . Smokeless tobacco: Never Used  . Alcohol Use: No     Socially     OB History    Grav Para Term Preterm Abortions TAB SAB Ect Mult Living   2 0   2  2   0      Review of Systems  All other systems reviewed and are negative.    Allergies  Aspirin; Codeine; Ibuprofen; Morphine; and Penicillins  Home Medications   Current Outpatient Rx  Name Route Sig Dispense Refill  . VITAMIN D 1000 UNITS PO TABS Oral Take 1,000 Units by mouth daily.    Marland Kitchen DOCUSATE SODIUM 100 MG PO CAPS Oral Take 200 mg by mouth every morning.    Marland Kitchen FERROUS SULFATE 325 (65 FE) MG PO TABS Oral Take 325 mg by mouth daily with breakfast.    . FLUOXETINE HCL 20 MG PO CAPS Oral Take 40 mg by mouth daily.    Marland Kitchen  LEVOTHYROXINE SODIUM 150 MCG PO TABS Oral Take 1 tablet (150 mcg total) by mouth daily. 90 tablet 1  . METOPROLOL TARTRATE 50 MG PO TABS Oral Take 50 mg by mouth daily.    . AZITHROMYCIN 250 MG PO TABS  Take 2 tablets by mouth the first day and then take 1 tablet by mouth for the next 4 days for a total of 5 days. 6 tablet 0  . CYANOCOBALAMIN 1000 MCG/ML IJ SOLN Intramuscular Inject 1 mL (1,000 mcg total) into the muscle every 30 (thirty) days. 10 mL 3    BP 115/82  Pulse 73  Temp 99.4 F (37.4 C) (Oral)  Resp 18  SpO2 99%  LMP 09/20/2011  Physical Exam  Vitals reviewed. Constitutional: She is oriented to person, place, and time. She appears well-developed and well-nourished. No distress.  HENT:  Head: Normocephalic and atraumatic.  Right Ear: External ear normal.  Left Ear: External ear normal.  Nose:  Nose normal.  Mouth/Throat: No oropharyngeal exudate.       Mild erythema of posterior pharynx and tonsils no tonsillar exudate or enlargement. Patent airway. Swallowing secretions well  Eyes: Conjunctivae and EOM are normal. Pupils are equal, round, and reactive to light.  Neck: Normal range of motion. Neck supple.  Cardiovascular: Normal rate, regular rhythm and normal heart sounds.  Exam reveals no gallop and no friction rub.   No murmur heard. Pulmonary/Chest: Effort normal and breath sounds normal. No respiratory distress. She has no wheezes. She has no rales. She exhibits no tenderness.  Abdominal: Soft. Bowel sounds are normal. She exhibits no distension and no mass. There is no tenderness. There is no rebound and no guarding.  Lymphadenopathy:    She has no cervical adenopathy.  Neurological: She is alert and oriented to person, place, and time. She has normal reflexes.  Skin: Skin is warm and dry. No rash noted. She is not diaphoretic.  Psychiatric: She has a normal mood and affect.    ED Course  Procedures (including critical care time)  PO tylenol  Labs Reviewed - No data to display No results found.   1. Cough   2. Pharyngitis       MDM  Patient is afebrile and nontoxic-appearing. Complaining of a 48 hr history of cough, sinus headache, hoarseness that we will treat with antibiotics. She is appropriate for outpatient treatment of pharyngitis, cough, with vital signs stable and normal chest exam. Patient has a primary care provider that she is agreeable to following up with the near future for recheck of ongoing symptoms. However spoke at length with changing or worsening symptoms that should prompt immediate return to emergency room. Patient voices her understanding is agreeable plan.        [place], [place] 01/05/12 2321

## 2012-01-05 NOTE — ED Notes (Signed)
Pt reports productive cough, sinus HA, and hoarseness - pt states this began on Tuesday and has gotten progressively worse - pt states "now I just don't feel well at all."

## 2012-01-05 NOTE — ED Notes (Signed)
Sore throat; cough; chest tenderness with cough

## 2012-01-13 ENCOUNTER — Institutional Professional Consult (permissible substitution): Payer: BC Managed Care – PPO | Admitting: Internal Medicine

## 2012-01-19 ENCOUNTER — Institutional Professional Consult (permissible substitution): Payer: BC Managed Care – PPO | Admitting: Pulmonary Disease

## 2012-02-06 ENCOUNTER — Institutional Professional Consult (permissible substitution): Payer: BC Managed Care – PPO | Admitting: Pulmonary Disease

## 2012-03-06 ENCOUNTER — Emergency Department (HOSPITAL_COMMUNITY)
Admission: EM | Admit: 2012-03-06 | Discharge: 2012-03-07 | Disposition: A | Discharge status: Other Acute Inpatient Hospital | Payer: Self-pay | Attending: Emergency Medicine | Admitting: Emergency Medicine

## 2012-03-06 DIAGNOSIS — Z79899 Other long term (current) drug therapy: Secondary | ICD-10-CM | POA: Insufficient documentation

## 2012-03-06 DIAGNOSIS — I10 Essential (primary) hypertension: Secondary | ICD-10-CM | POA: Insufficient documentation

## 2012-03-06 DIAGNOSIS — E039 Hypothyroidism, unspecified: Secondary | ICD-10-CM | POA: Insufficient documentation

## 2012-03-06 DIAGNOSIS — R45851 Suicidal ideations: Secondary | ICD-10-CM | POA: Insufficient documentation

## 2012-03-06 LAB — ACETAMINOPHEN LEVEL: Acetaminophen (Tylenol), Serum: <15 ug/mL (ref 10–30)

## 2012-03-06 LAB — COMPREHENSIVE METABOLIC PANEL
ALT: 14 U/L (ref 0–35)
AST: 18 U/L (ref 0–37)
Albumin: 3.8 g/dL (ref 3.5–5.2)
Alkaline Phosphatase: 47 U/L (ref 39–117)
Chloride: 99 mEq/L (ref 96–112)
Creatinine, Ser: 0.83 mg/dL (ref 0.50–1.10)
Potassium: 4.2 mEq/L (ref 3.5–5.1)
Sodium: 135 mEq/L (ref 135–145)
Total Bilirubin: 0.1 mg/dL — ABNORMAL LOW (ref 0.3–1.2)

## 2012-03-06 LAB — RAPID URINE DRUG SCREEN, HOSP PERFORMED
Barbiturates: NOT DETECTED
Benzodiazepines: NOT DETECTED
Cocaine: NOT DETECTED

## 2012-03-06 LAB — CBC WITH DIFFERENTIAL/PLATELET
Basophils Absolute: 0.1 10*3/uL (ref 0.0–0.1)
Basophils Relative: 1 % (ref 0–1)
MCHC: 31.3 g/dL (ref 30.0–36.0)
Neutro Abs: 4.3 10*3/uL (ref 1.7–7.7)
Neutrophils Relative %: 57 % (ref 43–77)
Platelets: 434 10*3/uL — ABNORMAL HIGH (ref 150–400)
RDW: 19.8 % — ABNORMAL HIGH (ref 11.5–15.5)
WBC: 7.6 10*3/uL (ref 4.0–10.5)

## 2012-03-06 MED ORDER — FLUOXETINE HCL 20 MG PO CAPS
20.0000 mg | ORAL_CAPSULE | Freq: Two times a day (BID) | ORAL | Status: DC
  Administered 2012-03-06 – 2012-03-07 (×2): 20 mg via ORAL
  Filled 2012-03-06 (×3): qty 1

## 2012-03-06 MED ORDER — DOCUSATE SODIUM 100 MG PO CAPS
200.0000 mg | ORAL_CAPSULE | Freq: Every morning | ORAL | Status: DC
  Administered 2012-03-07: 200 mg via ORAL
  Filled 2012-03-06: qty 2

## 2012-03-06 MED ORDER — ONDANSETRON HCL 4 MG PO TABS: 4.0000 mg | ORAL_TABLET | Freq: Three times a day (TID) | ORAL | Status: DC | PRN

## 2012-03-06 MED ORDER — ACETAMINOPHEN 325 MG PO TABS: 650.0000 mg | ORAL_TABLET | ORAL | Status: DC | PRN

## 2012-03-06 MED ORDER — LORAZEPAM 1 MG PO TABS: 1.0000 mg | ORAL_TABLET | Freq: Three times a day (TID) | ORAL | Status: DC | PRN

## 2012-03-06 MED ORDER — VITAMIN D3 25 MCG (1000 UNIT) PO TABS
1000.0000 [IU] | ORAL_TABLET | Freq: Every day | ORAL | Status: DC
Start: 2012-03-07 — End: 2012-03-07
  Administered 2012-03-07: 1000 [IU] via ORAL
  Filled 2012-03-06: qty 1

## 2012-03-06 MED ORDER — METOPROLOL TARTRATE 25 MG PO TABS
25.0000 mg | ORAL_TABLET | Freq: Two times a day (BID) | ORAL | Status: DC
  Administered 2012-03-06 – 2012-03-07 (×2): 25 mg via ORAL
  Filled 2012-03-06 (×2): qty 1

## 2012-03-06 MED ORDER — NICOTINE 21 MG/24HR TD PT24
21.0000 mg | MEDICATED_PATCH | Freq: Every day | TRANSDERMAL | Status: DC
  Filled 2012-03-06: qty 1

## 2012-03-06 MED ORDER — FERROUS SULFATE 325 (65 FE) MG PO TABS
325.0000 mg | ORAL_TABLET | Freq: Every day | ORAL | Status: DC
Start: 2012-03-07 — End: 2012-03-07
  Administered 2012-03-07: 325 mg via ORAL
  Filled 2012-03-06 (×3): qty 1

## 2012-03-06 MED ORDER — LEVOTHYROXINE SODIUM 150 MCG PO TABS
150.0000 ug | ORAL_TABLET | Freq: Every day | ORAL | Status: DC
  Administered 2012-03-07: 150 ug via ORAL
  Filled 2012-03-06 (×2): qty 1

## 2012-03-06 MED ORDER — TRAZODONE HCL 100 MG PO TABS
100.0000 mg | ORAL_TABLET | Freq: Every day | ORAL | Status: DC
  Administered 2012-03-06: 100 mg via ORAL
  Filled 2012-03-06: qty 1

## 2012-03-06 MED ORDER — ALUM & MAG HYDROXIDE-SIMETH 200-200-20 MG/5ML PO SUSP: 30.0000 mL | ORAL | Status: DC | PRN

## 2012-03-06 NOTE — ED Notes (Signed)
Pt belongings sent with best friend per pt request.  Pt friend Tia Alert [phone]

## 2012-03-06 NOTE — ED Provider Notes (Signed)
History     CSN: [id number]  Arrival date & time 03/06/12  1652   First MD Initiated Contact with Patient 03/06/12 1920      Chief Complaint  Patient presents with  . Suicidal    (Consider location/radiation/quality/duration/timing/severity/associated sxs/prior treatment) HPI Comments: Patient presents with complaint of "not feeling like myself". Patient was advised to come here by her therapist. Patient states she is having thoughts of hurting herself and is also concerned about herself "lashing out" at other people. Patient denies alcohol or drug use. She denies current medical complaints. Onset was acute. Course is constant. Nothing makes symptoms better or worse. Patient states she's been compliant with her Prozac.  The history is provided by the patient.    Past Medical History  Diagnosis Date  . Depression   . Hypertension   . Cancer 2005    THYROID  . Fibroid   . PMS (premenstrual syndrome)   . Hormone disorder   . PCOS (polycystic ovarian syndrome)   . Grave's disease   . 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 Anemia   . Seasonal allergies   . Hypothyroidism   . Headache     history - otc med prn  . Anxiety   . Vertigo     Past Surgical History  Procedure Date  . Thyroidectomy   . Myomectomy 2010    LAPAROSCOPY  . Umbilical hernia repair 1982    as child  . Diagnostic laparoscopy   . Abdominal hysterectomy 10/11/2011    Procedure: HYSTERECTOMY ABDOMINAL;  Surgeon: Ok Edwards, MD;  Location: [facility];  Service: Gynecology;  Laterality: N/A;  With Repair of serosa.    Family History  Problem Relation Age of Onset  . Thyroid disease Mother   . Diabetes Father   . Hypertension Father   . Heart disease Father   . Cancer Maternal Aunt     BRAIN  . Cancer Maternal Grandmother     LYMPHOMA  . Hypertension Maternal Grandmother   . Cancer Paternal Grandmother     PANCREATIC  . Diabetes Paternal Grandmother   .  Hypertension Paternal Grandmother   . Hypertension Maternal Grandfather   . Hypertension Paternal Grandfather     History  Substance Use Topics  . Smoking status: Never Smoker   . Smokeless tobacco: Never Used  . Alcohol Use: No     Socially     OB History    Grav Para Term Preterm Abortions TAB SAB Ect Mult Living   2 0   2  2   0      Review of Systems  Constitutional: Negative for fever.  HENT: Negative for sore throat and rhinorrhea.   Eyes: Negative for redness.  Respiratory: Negative for cough.   Cardiovascular: Negative for chest pain.  Gastrointestinal: Negative for nausea, vomiting, abdominal pain and diarrhea.  Genitourinary: Negative for dysuria.  Musculoskeletal: Negative for myalgias.  Skin: Negative for rash.  Neurological: Negative for headaches.  Psychiatric/Behavioral: Positive for suicidal ideas.    Allergies  Aspirin; Codeine; Ibuprofen; Morphine; and Penicillins  Home Medications   Current Outpatient Rx  Name Route Sig Dispense Refill  . ACETAMINOPHEN ER 650 MG PO TBCR Oral Take 650 mg by mouth daily as needed. Arthritis pain.    Marland Kitchen VITAMIN D 1000 UNITS PO TABS Oral Take 1,000 Units by mouth daily.    . CYANOCOBALAMIN 1000 MCG/ML IJ SOLN Intramuscular Inject 1 mL (  1,000 mcg total) into the muscle every 30 (thirty) days. 10 mL 3  . DOCUSATE SODIUM 100 MG PO CAPS Oral Take 200 mg by mouth every morning.    Marland Kitchen FERROUS SULFATE 325 (65 FE) MG PO TABS Oral Take 325 mg by mouth daily with breakfast.    . FLUOXETINE HCL 20 MG PO CAPS Oral Take 20 mg by mouth 2 (two) times daily.     Marland Kitchen LEVOTHYROXINE SODIUM 150 MCG PO TABS Oral Take 1 tablet (150 mcg total) by mouth daily. 90 tablet 1  . METOPROLOL TARTRATE 50 MG PO TABS Oral Take 25 mg by mouth 2 (two) times daily.     . TRAZODONE HCL 100 MG PO TABS Oral Take 100 mg by mouth at bedtime.      BP 129/78  Pulse 76  Temp 98.8 F (37.1 C) (Oral)  Resp 15  SpO2 100%  LMP 09/20/2011  Physical Exam    Nursing note and vitals reviewed. Constitutional: She appears well-developed and well-nourished.  HENT:  Head: Normocephalic and atraumatic.  Eyes: Conjunctivae normal are normal. Right eye exhibits no discharge. Left eye exhibits no discharge.  Neck: Normal range of motion. Neck supple.  Cardiovascular: Normal rate, regular rhythm and normal heart sounds.   Pulmonary/Chest: Effort normal and breath sounds normal.  Abdominal: Soft. There is no tenderness.  Neurological: She is alert.  Skin: Skin is warm and dry.  Psychiatric: She has a normal mood and affect. She expresses suicidal ideation. She expresses no homicidal ideation.    ED Course  Procedures (including critical care time)  Labs Reviewed  CBC WITH DIFFERENTIAL - Abnormal; Notable for the following:    Hemoglobin 10.5 (*)     HCT 33.5 (*)     MCV 77.2 (*)     MCH 24.2 (*)     RDW 19.8 (*)     Platelets 434 (*)     All other components within normal limits  COMPREHENSIVE METABOLIC PANEL - Abnormal; Notable for the following:    Total Bilirubin 0.1 (*)     GFR calc non Af Amer 88 (*)     All other components within normal limits  SALICYLATE LEVEL - Abnormal; Notable for the following:    Salicylate Lvl <2.0 (*)     All other components within normal limits  ETHANOL  ACETAMINOPHEN LEVEL  URINE RAPID DRUG SCREEN (HOSP PERFORMED)  POCT PREGNANCY, URINE   No results found.   1. Suicidal ideation     7:29 PM Patient seen and examined. Work-up reviewed. Holding orders completed.   Vital signs reviewed and are as follows: Filed Vitals:   03/06/12 1732  BP: 129/78  Pulse: 76  Temp: 98.8 F (37.1 C)  Resp: 15   8:42 PM Spoke with ACT team who will see and evaluate patient. Handoff to Dr. Anitra Lauth.    MDM  Pending ACT eval.       Renne Crigler, PA 03/06/12 2133

## 2012-03-06 NOTE — ED Notes (Signed)
Pt reports having hysterectomy in April 2013.  Pt reports increased depression, SI,HI after surgery

## 2012-03-06 NOTE — ED Notes (Signed)
Pt reports she is here because she is "having difficulty coping with reality", pt states she has had some recent med changes and 3 surgeries (thyroid removal/thyroid CA, fibroids removed and a hysterectomy), pt states since the surgeries she feels different/not herself, pt states she can't really explain the feeling but it is a different feeling. Pt reports she is feeling suicidal without a plan, however states you always have something planned in the back of your mine, HI but states no one in particular,states "I just feel angry sometimes and I want to lash out", pt tangential and has some difficulty processing her thoughts at times, states the meds don't seem to be helping her shake her feeling of depression and she just wants some relief/ feel better.

## 2012-03-06 NOTE — ED Provider Notes (Signed)
Medical screening examination/treatment/procedure(s) were performed by non-physician practitioner and as supervising physician I was immediately available for consultation/collaboration.   Gwyneth Sprout, MD 03/06/12 [phone]

## 2012-03-06 NOTE — ED Notes (Signed)
Pt presents with best friend with c/o depression x3 days.  Pt reports worsening depression today.  Pt reports SI/HI.  Pt reports staying in a shelter after  loosing job and apartment.  Pt reports "I just cant seem to get it together, I feel angry and i feel like I need to beat people up."  Pt states "I always have a plan, I have a lot of pills I can take."  "I am tired and alone".   Pt reports "My meds are not working."   Pt currently seeing Verlon [facility]'neil [facility] (psychiatrist).  Pt last seen md two week ago.  Appoint was scheduled yesterday, but missed her appoinment.

## 2012-03-07 ENCOUNTER — Inpatient Hospital Stay (HOSPITAL_COMMUNITY)
Admission: AD | Admit: 2012-03-07 | Discharge: 2012-03-14 | DRG: 885 | Disposition: A | Discharge status: Home / Self Care | Payer: Federal, State, Local not specified - Other | Source: Ambulatory Visit | Attending: Psychiatry | Admitting: Psychiatry

## 2012-03-07 ENCOUNTER — Encounter (HOSPITAL_COMMUNITY): Payer: Self-pay | Admitting: *Deleted

## 2012-03-07 DIAGNOSIS — T43205A Adverse effect of unspecified antidepressants, initial encounter: Secondary | ICD-10-CM | POA: Diagnosis not present

## 2012-03-07 DIAGNOSIS — I10 Essential (primary) hypertension: Secondary | ICD-10-CM | POA: Diagnosis present

## 2012-03-07 DIAGNOSIS — Z85850 Personal history of malignant neoplasm of thyroid: Secondary | ICD-10-CM

## 2012-03-07 DIAGNOSIS — Z5900 Homelessness unspecified: Secondary | ICD-10-CM

## 2012-03-07 DIAGNOSIS — Z79899 Other long term (current) drug therapy: Secondary | ICD-10-CM

## 2012-03-07 DIAGNOSIS — F429 Obsessive-compulsive disorder, unspecified: Secondary | ICD-10-CM | POA: Diagnosis present

## 2012-03-07 DIAGNOSIS — Z886 Allergy status to analgesic agent status: Secondary | ICD-10-CM

## 2012-03-07 DIAGNOSIS — G4440 Drug-induced headache, not elsewhere classified, not intractable: Secondary | ICD-10-CM | POA: Diagnosis not present

## 2012-03-07 DIAGNOSIS — E890 Postprocedural hypothyroidism: Secondary | ICD-10-CM | POA: Diagnosis present

## 2012-03-07 DIAGNOSIS — F339 Major depressive disorder, recurrent, unspecified: Secondary | ICD-10-CM

## 2012-03-07 DIAGNOSIS — R42 Dizziness and giddiness: Secondary | ICD-10-CM | POA: Diagnosis present

## 2012-03-07 DIAGNOSIS — M545 Low back pain, unspecified: Secondary | ICD-10-CM

## 2012-03-07 DIAGNOSIS — E039 Hypothyroidism, unspecified: Secondary | ICD-10-CM

## 2012-03-07 DIAGNOSIS — F332 Major depressive disorder, recurrent severe without psychotic features: Principal | ICD-10-CM | POA: Diagnosis present

## 2012-03-07 DIAGNOSIS — IMO0002 Reserved for concepts with insufficient information to code with codable children: Secondary | ICD-10-CM | POA: Diagnosis not present

## 2012-03-07 DIAGNOSIS — R45851 Suicidal ideations: Secondary | ICD-10-CM

## 2012-03-07 DIAGNOSIS — Z880 Allergy status to penicillin: Secondary | ICD-10-CM

## 2012-03-07 DIAGNOSIS — F4310 Post-traumatic stress disorder, unspecified: Secondary | ICD-10-CM | POA: Diagnosis present

## 2012-03-07 DIAGNOSIS — Y9210 Unspecified residential institution as the place of occurrence of the external cause: Secondary | ICD-10-CM | POA: Diagnosis not present

## 2012-03-07 DIAGNOSIS — Z90710 Acquired absence of both cervix and uterus: Secondary | ICD-10-CM

## 2012-03-07 DIAGNOSIS — F603 Borderline personality disorder: Secondary | ICD-10-CM | POA: Diagnosis present

## 2012-03-07 DIAGNOSIS — J309 Allergic rhinitis, unspecified: Secondary | ICD-10-CM | POA: Diagnosis present

## 2012-03-07 DIAGNOSIS — F5105 Insomnia due to other mental disorder: Secondary | ICD-10-CM | POA: Diagnosis present

## 2012-03-07 DIAGNOSIS — F489 Nonpsychotic mental disorder, unspecified: Secondary | ICD-10-CM

## 2012-03-07 MED ORDER — FLUOXETINE HCL 20 MG PO CAPS
20.0000 mg | ORAL_CAPSULE | Freq: Two times a day (BID) | ORAL | Status: DC
  Administered 2012-03-08 – 2012-03-13 (×11): 20 mg via ORAL
  Filled 2012-03-07 (×16): qty 1

## 2012-03-07 MED ORDER — ACETAMINOPHEN 325 MG PO TABS
650.0000 mg | ORAL_TABLET | Freq: Every day | ORAL | Status: DC | PRN
  Administered 2012-03-09 – 2012-03-12 (×4): 650 mg via ORAL

## 2012-03-07 MED ORDER — TRAZODONE HCL 100 MG PO TABS
100.0000 mg | ORAL_TABLET | Freq: Every day | ORAL | Status: DC
  Filled 2012-03-07: qty 1

## 2012-03-07 MED ORDER — LEVOTHYROXINE SODIUM 150 MCG PO TABS
150.0000 ug | ORAL_TABLET | Freq: Every day | ORAL | Status: DC
  Administered 2012-03-08 – 2012-03-14 (×7): 150 ug via ORAL
  Filled 2012-03-07 (×2): qty 1
  Filled 2012-03-07: qty 2
  Filled 2012-03-07 (×7): qty 1

## 2012-03-07 MED ORDER — METOPROLOL TARTRATE 25 MG PO TABS
25.0000 mg | ORAL_TABLET | Freq: Two times a day (BID) | ORAL | Status: DC
  Administered 2012-03-08: 25 mg via ORAL
  Filled 2012-03-07 (×6): qty 1

## 2012-03-07 MED ORDER — TRAZODONE HCL 100 MG PO TABS
100.0000 mg | ORAL_TABLET | Freq: Every evening | ORAL | Status: DC | PRN
  Administered 2012-03-07 (×2): 100 mg via ORAL
  Filled 2012-03-07 (×2): qty 1

## 2012-03-07 MED ORDER — VITAMIN D3 25 MCG (1000 UNIT) PO TABS
1000.0000 [IU] | ORAL_TABLET | Freq: Every day | ORAL | Status: DC
  Administered 2012-03-08 – 2012-03-14 (×7): 1000 [IU] via ORAL
  Filled 2012-03-07 (×10): qty 1

## 2012-03-07 MED ORDER — ALUM & MAG HYDROXIDE-SIMETH 200-200-20 MG/5ML PO SUSP: 30.0000 mL | ORAL | Status: DC | PRN

## 2012-03-07 MED ORDER — CYANOCOBALAMIN 1000 MCG/ML IJ SOLN
1000.0000 ug | INTRAMUSCULAR | Status: DC
  Administered 2012-03-08: 1000 ug via INTRAMUSCULAR
  Filled 2012-03-07: qty 1

## 2012-03-07 MED ORDER — MAGNESIUM HYDROXIDE 400 MG/5ML PO SUSP: 30.0000 mL | Freq: Every day | ORAL | Status: DC | PRN

## 2012-03-07 MED ORDER — DOCUSATE SODIUM 100 MG PO CAPS
200.0000 mg | ORAL_CAPSULE | Freq: Every morning | ORAL | Status: DC
  Administered 2012-03-08: 200 mg via ORAL
  Filled 2012-03-07 (×3): qty 2

## 2012-03-07 MED ORDER — FERROUS SULFATE 325 (65 FE) MG PO TABS
325.0000 mg | ORAL_TABLET | Freq: Every day | ORAL | Status: DC
Start: 2012-03-08 — End: 2012-03-08
  Administered 2012-03-08: 325 mg via ORAL
  Filled 2012-03-07 (×3): qty 1

## 2012-03-07 NOTE — Progress Notes (Signed)
Pt at the window for HS meds.  She is new to the unit, arriving around dinner time.  Pt appears flat/depressed.  She contracts for safety on the unit.  She attended evening group.  She was observed earlier getting acquainted with her roommate.  Pt was encouraged to make her needs known to staff.  Pt voices understanding.  This Clinical research associate discussed the meds that have been ordered for the pt tonight.  Pt is agreeable.  Pt denies HI/AV.  Safety maintained with q15 minutes.
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
## 2012-03-07 NOTE — ED Notes (Signed)
Contacted Dr. Roselyn Bering regarding discharge. Pt will be discharged soon to [facility].

## 2012-03-07 NOTE — Progress Notes (Addendum)
Admission Note:  Pt is a 41 year old female admitted voluntarily to [facility].  She reportedly came to the ED yesterday after reportedly increased feelings of depression and had SI, and HI which pt.says that the HI was just more anger than actual feelings of wanting to harm someone else.  She says she was planning to take a bunch of pills if she went home.  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  She became homeless as a result and has been staying at the shelter [facility]) almost a month now.  She has had 3 surgeries in the past.  Reports that she had thyroid cancer in 2005 and had a hysterectomy in April 2013--she said she had a tumor and had to have the surgery. She states she has high blood pressure and takes medication for this.   She reports a fall about 1 month ago.  Says she gets dizzy at times when getting up and when she bends over she sometimes feels light-headed.  She denies any current or past alcohol use.  She denies SI on admission and does contract for safety.  She also denies A/V hallucinations.  She has allergies to ASA, Codeine, Ibuprofen, Morphine and PCN.  She has flat, sad affect with depressed mood.   Reports being followed by [facility] for her psychiatric meds. Pt cooperative with admission process. Given food and fluids and oriented to unit.  Pt placed on q 15 minute checks for safety.  Report given to other RN Inetta Fermo).

## 2012-03-07 NOTE — ED Notes (Signed)
Called report to [facility] at approximately 1400. [facility] staff stated that they would call me back in a few minutes (have not called back). Just made contact with [facility]. Pt is going to room 508, bed 2. Pt ready for transfer.

## 2012-03-07 NOTE — [facility] Counselor (Signed)
Accepted to [facility] by Dr. Dan Humphreys to Dr. Dan Humphreys [phone]). Spoke with Tyleen in Registration to confirm that pt does not have insurance. Completed support documentation. Updated EDP and RN. Pt is voluntary and to be transported via security.

## 2012-03-07 NOTE — Tx Team (Signed)
Initial Interdisciplinary Treatment Plan  PATIENT STRENGTHS: (choose at least two) Average or above average intelligence Capable of independent living Communication skills General fund of knowledge Motivation for treatment/growth  PATIENT STRESSORS: Financial difficulties Loss of job and home* Medication change or noncompliance Occupational concerns Traumatic event   PROBLEM LIST: Problem List/Patient Goals Date to be addressed Date deferred Reason deferred Estimated date of resolution  Depression      Risk for self harm      Homeless/lost job      Hx thyroid CA                                     DISCHARGE CRITERIA:  Ability to meet basic life and health needs Adequate post-discharge living arrangements Improved stabilization in mood, thinking, and/or behavior Motivation to continue treatment in a less acute level of care Reduction of life-threatening or endangering symptoms to within safe limits Verbal commitment to aftercare and medication compliance  PRELIMINARY DISCHARGE PLAN: Attend aftercare/continuing care group Outpatient therapy Placement in alternative living arrangements  PATIENT/FAMIILY INVOLVEMENT: This treatment plan has been presented to and reviewed with the patient, Lynn Martinez, and/or family member.  The patient and family have been given the opportunity to ask questions and make suggestions.  Jesus Genera [facility] 03/07/2012, 9:16 PM

## 2012-03-07 NOTE — ED Provider Notes (Signed)
Patient sleeping this morning.  Filed Vitals:   03/07/12 0514  BP: 104/68  Pulse: 60  Temp: 98.2 F (36.8 C)  Resp: 18   Patient awaiting psychiatric disposition. Patient is medically clear  Celene Kras, MD 03/07/12 [phone]

## 2012-03-08 DIAGNOSIS — F339 Major depressive disorder, recurrent, unspecified: Secondary | ICD-10-CM | POA: Diagnosis present

## 2012-03-08 MED ORDER — PROPRANOLOL HCL ER 60 MG PO CP24
60.0000 mg | ORAL_CAPSULE | Freq: Every day | ORAL | Status: DC
  Administered 2012-03-08 – 2012-03-13 (×6): 60 mg via ORAL
  Filled 2012-03-08 (×5): qty 1
  Filled 2012-03-08: qty 14
  Filled 2012-03-08 (×2): qty 1

## 2012-03-08 MED ORDER — PNEUMOCOCCAL VAC POLYVALENT 25 MCG/0.5ML IJ INJ
0.5000 mL | INJECTION | INTRAMUSCULAR | Status: AC
  Administered 2012-03-09: 0.5 mL via INTRAMUSCULAR

## 2012-03-08 MED ORDER — DOCUSATE SODIUM 100 MG PO CAPS
200.0000 mg | ORAL_CAPSULE | Freq: Three times a day (TID) | ORAL | Status: DC
  Administered 2012-03-09 – 2012-03-14 (×17): 200 mg via ORAL
  Filled 2012-03-08 (×8): qty 2
  Filled 2012-03-08: qty 1
  Filled 2012-03-08: qty 2
  Filled 2012-03-08: qty 1
  Filled 2012-03-08 (×14): qty 2

## 2012-03-08 MED ORDER — INFLUENZA VIRUS VACC SPLIT PF IM SUSP
0.5000 mL | INTRAMUSCULAR | Status: AC
  Administered 2012-03-09: 0.5 mL via INTRAMUSCULAR

## 2012-03-08 MED ORDER — FERROUS SULFATE 325 (65 FE) MG PO TABS
325.0000 mg | ORAL_TABLET | Freq: Three times a day (TID) | ORAL | Status: DC
  Administered 2012-03-09 – 2012-03-13 (×14): 325 mg via ORAL
  Filled 2012-03-08 (×21): qty 1

## 2012-03-08 MED ORDER — TRAZODONE HCL 100 MG PO TABS
200.0000 mg | ORAL_TABLET | Freq: Every day | ORAL | Status: DC
  Administered 2012-03-08 – 2012-03-12 (×5): 200 mg via ORAL
  Filled 2012-03-08 (×8): qty 2
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
## 2012-03-08 NOTE — Progress Notes (Signed)
Patient ID: Lynn Martinez, female   DOB: June 27, 1970, 41 y.o.   MRN: [id number] D: Pt. In room coloring, makes eye contact. Pt. Reports "life went down hill since my hysterectomy surgery." "I I lost my job, my car, my house, having a hard time coping". Pt. Reports groups been helpful "trying to help my defaults, be thankful for what I do have" " try to maintain, think of positive's"  Pt. Says one group "went over trying again,out lines strengths." A: Pt. Encouraged to keep focusing on the positive side of life and move forward with the skills she is gaining. Staff will monitor q30min for safety. Staff encouraged group. R: Pt. Is safe on the unit. Pt. Went to [employer]. "I enjoyed it."

## 2012-03-08 NOTE — Progress Notes (Signed)
Psychoeducational Group Note  Date:  03/08/2012 Time:  1100  Group Topic/Focus:  Building Self Esteem:   The Focus of this group is helping patients become aware of the effects of self-esteem on their lives, the things they and others do that enhance or undermine their self-esteem, seeing the relationship between their level of self-esteem and the choices they make and learning ways to enhance self-esteem.  Participation Level:  Active  Participation Quality:  Appropriate, Attentive and Sharing  Affect:  Appropriate  Cognitive:  Appropriate  Insight:  Good  Engagement in Group:  Good  Additional Comments:  Pt attended and participated in self esteem group, Pt defined self-esteem in own terms, and the difference between high and low self-esteem. Pt stated negative and positive factors of physical, emotional, internal and external that decrease and increase self-esteem. Pt completed worksheet on identifying in an alphabetical order of positive things that contribute to high self-esteem, and things about self that were important.   Lynn Martinez 03/08/2012, 6:45 PM

## 2012-03-08 NOTE — Progress Notes (Signed)
[facility] MD Progress Note  03/08/2012 6:20 PM  Diagnosis:   Axis I: Major Depression, Recurrent severe Axis II: Deferred Axis III:  Past Medical History  Diagnosis Date  . Depression   . Hypertension   . Cancer 2005    THYROID  . Fibroid   . PMS (premenstrual syndrome)   . Hormone disorder   . PCOS (polycystic ovarian syndrome)   . Grave's disease   . 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 Anemia   . Seasonal allergies   . Hypothyroidism   . Headache     history - otc med prn  . Anxiety   . Vertigo    Axis IV: other psychosocial or environmental problems Axis V: 21-30 behavior considerably influenced by delusions or hallucinations OR serious impairment in judgment, communication OR inability to function in almost all areas  ADL's:  Intact  Sleep per pt rating: Fair, better after getting second dose of Trazodone  Sleep noted by staff:Marland Kitchen Number of Hours: 6.75   Appetite:  Fair  Suicidal Ideation:  Pt reported lots of thoughts of killing herself and thoughts of taking a lot of pills. Homicidal Ideation:  Pt denies any thoughts, plans, intent of homicide  AEB (as evidenced by):per pt report  Mental Status Examination/Evaluation: Objective:  Appearance: Casual  Eye Contact::  Good  Speech:  Clear and Coherent  Volume:  Normal  Mood:  Anxious, Depressed, Hopeless, Irritable and Worthless  Affect:  Congruent  Thought Process:  Coherent  Orientation:  Full  Thought Content:  WDL  Suicidal Thoughts:  Yes.  without intent/plan  Homicidal Thoughts:  No  Memory:  Immediate;   Fair Recent;   Fair Remote;   Fair  Judgement:  Impaired  Insight:  Lacking  Psychomotor Activity:  Normal  Concentration:  Fair  Recall:  Fair  Akathisia:  No  Handed:  Right  AIMS (if indicated):     Assets:  Communication Skills Desire for Improvement  Sleep:  Number of Hours: 6.75    Vital Signs:Blood pressure 121/77, pulse 82, temperature 98 F (36.7 C),  temperature source Oral, resp. rate 18, height 5' 6.14" (1.68 m), weight 108.863 kg (240 lb), last menstrual period 09/20/2011. Current Medications: Current Facility-Administered Medications  Medication Dose Route Frequency Provider Last Rate Last Dose  . acetaminophen (TYLENOL) tablet 650 mg  650 mg Oral Daily PRN Mike Craze, MD      . alum & mag hydroxide-simeth (MAALOX/MYLANTA) 200-200-20 MG/5ML suspension 30 mL  30 mL Oral Q4H PRN Mike Craze, MD      . cholecalciferol (VITAMIN D) tablet 1,000 Units  1,000 Units Oral Daily Mike Craze, MD   1,000 Units at 03/08/12 0756  . cyanocobalamin ((VITAMIN B-12)) injection 1,000 mcg  1,000 mcg Intramuscular Q30 days Mike Craze, MD   1,000 mcg at 03/08/12 0955  . docusate sodium (COLACE) capsule 200 mg  200 mg Oral TID AC Mike Craze, MD      . ferrous sulfate tablet 325 mg  325 mg Oral TID WC Mike Craze, MD      . FLUoxetine (PROZAC) capsule 20 mg  20 mg Oral BID Mike Craze, MD   20 mg at 03/08/12 1717  . levothyroxine (SYNTHROID, LEVOTHROID) tablet 150 mcg  150 mcg Oral Daily Mike Craze, MD   150 mcg at 03/08/12 [phone]  . magnesium hydroxide (MILK OF MAGNESIA) suspension 30 mL  30 mL Oral Daily PRN Mike Craze, MD      . propranolol ER (INDERAL LA) 24 hr capsule 60 mg  60 mg Oral Daily Mike Craze, MD   60 mg at 03/08/12 1717  . traZODone (DESYREL) tablet 100 mg  100 mg Oral QHS PRN,MR X 1 Mickie D. Adams, PA   100 mg at 03/07/12 2253  . DISCONTD: docusate sodium (COLACE) capsule 200 mg  200 mg Oral q morning - 10a Mike Craze, MD   200 mg at 03/08/12 0954  . DISCONTD: ferrous sulfate tablet 325 mg  325 mg Oral Q breakfast Mike Craze, MD   325 mg at 03/08/12 0756  . DISCONTD: metoprolol tartrate (LOPRESSOR) tablet 25 mg  25 mg Oral BID Mike Craze, MD   25 mg at 03/08/12 0756  . DISCONTD: traZODone (DESYREL) tablet 100 mg  100 mg Oral QHS Mike Craze, MD        Lab Results:  Results for orders placed  during the hospital encounter of 03/06/12 (from the past 48 hour(s))  CBC WITH DIFFERENTIAL     Status: Abnormal   Collection Time   03/06/12  6:30 PM      Component Value Range Comment   WBC 7.6  4.0 - 10.5 K/uL    RBC 4.34  3.87 - 5.11 MIL/uL    Hemoglobin 10.5 (*) 12.0 - 15.0 g/dL    HCT 16.1 (*) 09.6 - 46.0 %    MCV 77.2 (*) 78.0 - 100.0 fL    MCH 24.2 (*) 26.0 - 34.0 pg    MCHC 31.3  30.0 - 36.0 g/dL    RDW 04.5 (*) 40.9 - 15.5 %    Platelets 434 (*) 150 - 400 K/uL    Neutrophils Relative 57  43 - 77 %    Neutro Abs 4.3  1.7 - 7.7 K/uL    Lymphocytes Relative 35  12 - 46 %    Lymphs Abs 2.6  0.7 - 4.0 K/uL    Monocytes Relative 7  3 - 12 %    Monocytes Absolute 0.5  0.1 - 1.0 K/uL    Eosinophils Relative 1  0 - 5 %    Eosinophils Absolute 0.1  0.0 - 0.7 K/uL    Basophils Relative 1  0 - 1 %    Basophils Absolute 0.1  0.0 - 0.1 K/uL   COMPREHENSIVE METABOLIC PANEL     Status: Abnormal   Collection Time   03/06/12  6:30 PM      Component Value Range Comment   Sodium 135  135 - 145 mEq/L    Potassium 4.2  3.5 - 5.1 mEq/L    Chloride 99  96 - 112 mEq/L    CO2 26  19 - 32 mEq/L    Glucose, Bld 84  70 - 99 mg/dL    BUN 14  6 - 23 mg/dL    Creatinine, Ser 8.11  0.50 - 1.10 mg/dL    Calcium 9.8  8.4 - 91.4 mg/dL    Total Protein 7.6  6.0 - 8.3 g/dL    Albumin 3.8  3.5 - 5.2 g/dL    AST 18  0 - 37 U/L    ALT 14  0 - 35 U/L    Alkaline Phosphatase 47  39 - 117 U/L    Total Bilirubin 0.1 (*) 0.3 - 1.2 mg/dL    GFR calc non Af Amer 88 (*) >90  mL/min    GFR calc Af Amer >90  >90 mL/min   ETHANOL     Status: Normal   Collection Time   03/06/12  6:30 PM      Component Value Range Comment   Alcohol, Ethyl (B) <11  0 - 11 mg/dL   ACETAMINOPHEN LEVEL     Status: Normal   Collection Time   03/06/12  6:30 PM      Component Value Range Comment   Acetaminophen (Tylenol), Serum <15.0  10 - 30 ug/mL   SALICYLATE LEVEL     Status: Abnormal   Collection Time   03/06/12  6:30 PM       Component Value Range Comment   Salicylate Lvl <2.0 (*) 2.8 - 20.0 mg/dL     Physical Findings: AIMS: Facial and Oral Movements Muscles of Facial Expression: None, normal Lips and Perioral Area: None, normal Jaw: None, normal Tongue: None, normal,Extremity Movements Upper (arms, wrists, hands, fingers): None, normal Lower (legs, knees, ankles, toes): None, normal, Trunk Movements Neck, shoulders, hips: None, normal, Overall Severity Severity of abnormal movements (highest score from questions above): None, normal Incapacitation due to abnormal movements: None, normal, Dental Status Current problems with teeth and/or dentures?: No Does patient usually wear dentures?: No  CIWA:    COWS:     Treatment Plan Summary: Daily contact with patient to assess and evaluate symptoms and progress in treatment Medication management Mood/anxiety less than 3/10 where the scale is 1 is the best and 10 is the worst No suicidal or homicidal thoughts for at least 48 hours.  Plan: Admit, continue Prozac, switch Lopressor for Inderal, adjust iron replacement and Colase to pattern to replace her very low iron and helps her bowel work semi typical.  Discussed the risks, benefits, and probable clinical course with and without treatment.  Pt is agreeable to the current course of treatment.  Cedric Denison 03/08/2012, 6:20 PM

## 2012-03-08 NOTE — Progress Notes (Signed)
Patient seen during discharge planning group. Patient explained that she is a cancer survivor, she had her thyroid removed in 2006 and had a historectomy in April of 2013 due to fibroids.  Patient communicates that she is now unemployed and homeless, and having trouble coping.  She is currently living in a shelter and plans to return there upon discharge.  Patient dictates that she has chronic passive SI and more recently these thoughts have become more prominent.  She rates depression at 8, anxiety at 9, hopeless at 8, and helplessness at 10.  Patient experiencing passive SI. She contracts for safety. Patient educated on the suicide prevention during group.

## 2012-03-08 NOTE — Progress Notes (Signed)
   [facility] Group Notes: (Counselor/Nursing/MHT/Case Management/Adjunct) 03/08/2012   @1 :15pm [employer] [facility]  Type of Therapy:  Group Therapy  Participation Level:  Good  Participation Quality:  Good  Affect:  Flat/Tearful  Cognitive:  Appropriate  Insight:  Good  Engagement in Group:  Good  Engagement in Therapy:  Good  Modes of Intervention:  Support and Exploration  Summary of Progress/Problems: Chrissy participated with speaker from [employer] [facility] [place] and expressed interest in programs [facility] offers. She shared that being a good listener is a strength that she has. Nicle related well with the speaker and shared with her afterward the power of listening to the story of a peer who really knows what she has been through. She reported that she will be attending [facility] classes and thanked the speaker several times for giving her hope.   Billie Lade 03/08/2012  3:28 PM

## 2012-03-08 NOTE — Tx Team (Signed)
Interdisciplinary Treatment Plan Update (Adult)  Date:  03/08/2012  Time Reviewed:  9:45 AM   Progress in Treatment: Attending groups:   Yes   Participating in groups:  Yes Taking medication as prescribed:  Yes Tolerating medication:  Yes Family/Significant othe contact made: Counselor to follow up on collateral contact Patient understands diagnosis:  Yes Discussing patient identified problems/goals with staff: Yes Medical problems stabilized or resolved: Yes Denies suicidal/homicidal ideation:Yes Issues/concerns per patient self-inventory:  Other:  New problem(s) identified:  Reason for Continuation of Hospitalization: Anxiety Depression Medication stabilization Suicidal ideation  Interventions implemented related to continuation of hospitalization: Medication management, coping skills development, reduction/elimination of SI  Medication Management; safety checks q 15 mins  Additional comments:  Estimated length of stay:  Discharge Plan:  New goal(s):  Review of initial/current patient goals per problem list:    1.  Goal(s): Eliminate SI/other thoughts of self harm   Met:  No  Target date: d/c  As evidenced by: Patient will no longer endorse SI/other thought self harm    2.  Goal (s): Reduce depression/anxiety (rated at eight and nine)   Met:  No  Target date: d/c  As evidenced by: Patient will rate symptoms at four or below      3.  Goal(s):  .stabilize on meds   Met:  No  Target date:  d/c  As evidenced by: Patient will report being stable on medications - symptoms have decreased    4.  Goal(s): Refer for outpatient follow up   Met:  No  Target date: d/c  As evidenced by: Follow up scheduled  Attendees: Patient:  Lynn Martinez 03/08/2012 9:46 AM  Family:     Physician:  Orson Aloe, MD 03/08/2012 9:45 AM   Nursing:   Berneice Heinrich, RN 03/08/2012 9:45 AM   CaseManager:  Juline Patch, LCSW 03/08/2012 9:45 AM   Counselor:   Angus Palms, LCSW 03/08/2012 9:45 AM   Other:  [place] Kirk, Nursing Student     03/08/2012 9:46 AM

## 2012-03-08 NOTE — Progress Notes (Signed)
D) Patient assertive and bright upon my assessment. Patient states slept " well once I had my sleep medicine", appetite is " improving" today. Patient endorses passive SI, contracts verbally for safety with RN. Patient denies HI, denies A/V hallucinations at this time.   A) Patient offered support and encouragement. Patient seen by treatment team and MD to evaluate care plan and medications. Patient verbalized understanding of care plan. Patient remains safe on unit with Q15 minute checks for safety.   R) Patient engaging with peers on unit. Patient attending groups in day room and meals in dining room. Patient taking medications as ordered.  Patient insightful, has a plan to "get everything right with me before I go back to the shelter." Patient verbalizes no questions/concerns at this time. Will continue to monitor.
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
## 2012-03-08 NOTE — Progress Notes (Signed)
Psychoeducational Group Note  Date:  03/08/2012 Time:  10:00  Group Topic/Focus:  Gratitude Journaling Participation Level:  Active  Participation Quality:  Appropriate, Attentive and Sharing  Affect:  Depressed and Flat  Cognitive:  Appropriate  Insight:  Good  Engagement in Group:  Good  Additional Comments:  Pt shared that she was thankful for butterflies and shared with the group about her thyroid operation.  Pt also shared other things from her list in her Gratitude Journal.   Lynn Martinez 03/08/2012, 1:33 PM
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
## 2012-03-09 DIAGNOSIS — F332 Major depressive disorder, recurrent severe without psychotic features: Principal | ICD-10-CM

## 2012-03-09 LAB — HEMOGLOBIN A1C
Hgb A1c MFr Bld: 5.9 % — ABNORMAL HIGH (ref <5.7)
Mean Plasma Glucose: 123 mg/dL — ABNORMAL HIGH (ref <117)

## 2012-03-09 LAB — T3, FREE: T3, Free: 2.9 pg/mL (ref 2.3–4.2)

## 2012-03-09 LAB — T4, FREE: Free T4: 1.18 ng/dL (ref 0.80–1.80)

## 2012-03-09 NOTE — Progress Notes (Signed)
D) Patient pleasant and cooperative upon my assessment. Patient states slept " fair", and appetite is "good " today. Patient  rates both depression and feelings of hopelessness as 7/10 today. Patient endorses passive SI, contracts verbally for safety with RN. Patient denies HI, denies A/V hallucinations at this time.   A) Patient offered support and encouragement. Patient seen by MD to evaluate care plan and medications. Patient verbalized understanding of care plan. Patient remains safe on unit with Q15 minute checks for safety.   R) Patient engaging with peers on unit. Patient attending groups in day room and meals in dining room. Patient insightful today, has a plan to "focus on the positive and follow up with mental health," once she is discharged from [facility]. Patient verbalizes no questions/concerns at this time. Will continue to monitor.

## 2012-03-09 NOTE — Progress Notes (Signed)
[facility] Group Notes:  (Counselor/Nursing/MHT/Case Management/Adjunct) 03/09/2012  1:15pm-2:30pm Preventing Relapse   Type of Therapy:  Group Therapy  Participation Level:  Did Not Attend     Angus Palms, LCSW 03/09/2012  2:40 PM

## 2012-03-09 NOTE — Progress Notes (Signed)
[facility] MD Progress Note  03/09/2012 3:29 PM S: "I am having trouble with dizziness".  O: Pt seen in bed in her room.  She had drool in the side of her face from sleeping so soundly in the middle of the day.  She wants the Iron after each meal.  ROS: Neuro: no headaches, ataxia, weakness GI: no N/V/D/constipation, but noted some cramps with the Iron before breakfast. MS: no weakness, muscle cramps, aches.  Plan: Shift Iron to after meals.   Diagnosis:   Axis I: Major Depression, Recurrent severe Axis II: Deferred Axis III:  Past Medical History  Diagnosis Date  . Depression   . Hypertension   . Cancer 2005    THYROID  . Fibroid   . PMS (premenstrual syndrome)   . Hormone disorder   . PCOS (polycystic ovarian syndrome)   . Grave's disease   . 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 Anemia   . Seasonal allergies   . Hypothyroidism   . Headache     history - otc med prn  . Anxiety   . Vertigo    Axis IV: other psychosocial or environmental problems Axis V: 21-30 behavior considerably influenced by delusions or hallucinations OR serious impairment in judgment, communication OR inability to function in almost all areas  ADL's:  Intact  Sleep per pt rating: Fair, better after getting second dose of Trazodone  Sleep noted by staff:Marland Kitchen Number of Hours: 6.5   Appetite:  Fair  Suicidal Ideation:  Pt reported lots of thoughts of killing herself and thoughts of taking a lot of pills. Homicidal Ideation:  Pt denies any thoughts, plans, intent of homicide  AEB (as evidenced by):per pt report  Mental Status Examination/Evaluation: Objective:  Appearance: Casual  Eye Contact::  Good  Speech:  Clear and Coherent  Volume:  Normal  Mood:  Anxious, Depressed, Hopeless, Irritable and Worthless  Affect:  Congruent  Thought Process:  Coherent  Orientation:  Full  Thought Content:  WDL  Suicidal Thoughts:  Yes.  without intent/plan  Homicidal  Thoughts:  No  Memory:  Immediate;   Fair Recent;   Fair Remote;   Fair  Judgement:  Impaired  Insight:  Lacking  Psychomotor Activity:  Normal  Concentration:  Fair  Recall:  Fair  Akathisia:  No  Handed:  Right  AIMS (if indicated):     Assets:  Communication Skills Desire for Improvement  Sleep:  Number of Hours: 6.5    Vital Signs:Blood pressure 115/74, pulse 69, temperature 96.9 F (36.1 C), temperature source Oral, resp. rate 20, height 5' 6.14" (1.68 m), weight 108.863 kg (240 lb), last menstrual period 09/20/2011. Current Medications: Current Facility-Administered Medications  Medication Dose Route Frequency Provider Last Rate Last Dose  . acetaminophen (TYLENOL) tablet 650 mg  650 mg Oral Daily PRN Mike Craze, MD      . alum & mag hydroxide-simeth (MAALOX/MYLANTA) 200-200-20 MG/5ML suspension 30 mL  30 mL Oral Q4H PRN Mike Craze, MD      . cholecalciferol (VITAMIN D) tablet 1,000 Units  1,000 Units Oral Daily Mike Craze, MD   1,000 Units at 03/09/12 0755  . cyanocobalamin ((VITAMIN B-12)) injection 1,000 mcg  1,000 mcg Intramuscular Q30 days Mike Craze, MD   1,000 mcg at 03/08/12 0955  . docusate sodium (COLACE) capsule 200 mg  200 mg Oral TID AC Mike Craze, MD   200 mg at 03/09/12  1207  . ferrous sulfate tablet 325 mg  325 mg Oral TID WC Mike Craze, MD   325 mg at 03/09/12 1207  . FLUoxetine (PROZAC) capsule 20 mg  20 mg Oral BID Mike Craze, MD   20 mg at 03/09/12 0755  . influenza  inactive virus vaccine (FLUZONE/FLUARIX) injection 0.5 mL  0.5 mL Intramuscular Tomorrow-1000 Mike Craze, MD   0.5 mL at 03/09/12 1458  . levothyroxine (SYNTHROID, LEVOTHROID) tablet 150 mcg  150 mcg Oral Daily Mike Craze, MD   150 mcg at 03/09/12 0654  . magnesium hydroxide (MILK OF MAGNESIA) suspension 30 mL  30 mL Oral Daily PRN Mike Craze, MD      . pneumococcal 23 valent vaccine (PNU-IMMUNE) injection 0.5 mL  0.5 mL Intramuscular Tomorrow-1000 Mike Craze, MD   0.5 mL at 03/09/12 1456  . propranolol ER (INDERAL LA) 24 hr capsule 60 mg  60 mg Oral Daily Mike Craze, MD   60 mg at 03/08/12 1717  . traZODone (DESYREL) tablet 200 mg  200 mg Oral QHS Mike Craze, MD   200 mg at 03/08/12 2209  . DISCONTD: traZODone (DESYREL) tablet 100 mg  100 mg Oral QHS PRN,MR X 1 Mickie D. Pernell Dupre, PA   100 mg at 03/07/12 2253    Lab Results:  Results for orders placed during the hospital encounter of 03/07/12 (from the past 48 hour(s))  T3, FREE     Status: Normal   Collection Time   03/08/12  8:38 PM      Component Value Range Comment   T3, Free 2.9  2.3 - 4.2 pg/mL   T4, FREE     Status: Normal   Collection Time   03/08/12  8:38 PM      Component Value Range Comment   Free T4 1.18  0.80 - 1.80 ng/dL   HEMOGLOBIN X9J     Status: Abnormal   Collection Time   03/08/12  8:38 PM      Component Value Range Comment   Hemoglobin A1C 5.9 (*) <5.7 %    Mean Plasma Glucose 123 (*) <117 mg/dL     Physical Findings: AIMS: Facial and Oral Movements Muscles of Facial Expression: None, normal Lips and Perioral Area: None, normal Jaw: None, normal Tongue: None, normal,Extremity Movements Upper (arms, wrists, hands, fingers): None, normal Lower (legs, knees, ankles, toes): None, normal, Trunk Movements Neck, shoulders, hips: None, normal, Overall Severity Severity of abnormal movements (highest score from questions above): None, normal Incapacitation due to abnormal movements: None, normal Patient's awareness of abnormal movements (rate only patient's report): No Awareness, Dental Status Current problems with teeth and/or dentures?: No Does patient usually wear dentures?: No  CIWA:    COWS:     Treatment Plan Summary: Daily contact with patient to assess and evaluate symptoms and progress in treatment Medication management Mood/anxiety less than 3/10 where the scale is 1 is the best and 10 is the worst No suicidal or homicidal thoughts for at  least 48 hours.  Decklin Weddington 03/09/2012, 3:29 PM

## 2012-03-09 NOTE — Progress Notes (Signed)
Patient did attend the evening karaoke group. Patient participated by singing two songs.   

## 2012-03-09 NOTE — Progress Notes (Signed)
Patient seen during discharge planning group. She reports feeling better today however, she is still experiencing passive SI. Patient contracts for safety. She describes feeling some nausea from new medications. Patient reports depression at seven, anxiety at seven, hopelessness at seven, and helplessness at seven.

## 2012-03-09 NOTE — Progress Notes (Signed)
Psychoeducational Group Note  Date:  03/09/2012 Time:  1100  Group Topic/Focus:  Early Warning Signs:   The focus of this group is to help patients identify signs or symptoms they exhibit before slipping into an unhealthy state or crisis.  Participation Level:  Active  Participation Quality:  Appropriate, Attentive and Sharing  Affect:  Appropriate  Cognitive:  Appropriate  Insight:  Good  Engagement in Group:  Good  Additional Comments:   Pt attended Early Warning Signs Group. Pt encouraged to share an episode that brought her into the hospital. Pt stated what she was thinking while in crisis. Pt explained signs that were beginning to become unhealthy during the time of crisis. Pt stated warning signs of relapse in categories of behavior, attitude, feeling, and thought changes, and an example to follow. Pt discussed how relapsing effected relationships and personal life.   Lynn Martinez 03/09/2012, 1:24 PM

## 2012-03-09 NOTE — H&P (Signed)
Medical/psychiatric screening examination/treatment/procedure(s) were performed by non-physician practitioner and as supervising physician I was immediately available for consultation/collaboration.  I have seen and examined this patient and agree with the major elements of this evaluation.  

## 2012-03-09 NOTE — Progress Notes (Signed)
03/09/2012         Time: 1500      Group Topic/Focus: The focus of this group is on enhancing patients' problem solving skills, which involves identifying the problem, brainstorming solutions and choosing and trying a solution.  Participation Level: Active  Participation Quality: Appropriate and Attentive  Affect: Blunted  Cognitive: Oriented  Additional Comments: None.   Lynn Martinez 03/09/2012 3:44 PM   

## 2012-03-10 NOTE — Progress Notes (Signed)
Patient ID: Lynn Martinez, female   DOB: 1970-09-07, 42 y.o.   MRN: [id number]  Pt. attended and participated in aftercare planning group. Pt. accepted information on suicide prevention, warning signs to look for with suicide and crisis line numbers to use. The pt. agreed to call crisis line numbers if having warning signs or having thoughts of suicide. Pt shared that she is in pain but managing it. Pt stated she has spoken to her nurse about the pain. No S/I or H/I.

## 2012-03-10 NOTE — Progress Notes (Signed)
Patient ID: Lynn Martinez, female   DOB: 25-May-1971, 40 y.o.   MRN: [id number]   [facility] Group Notes:  (Counselor/Nursing/MHT/Case Management/Adjunct)  03/10/2012 1:15 PM  Type of Therapy:  Group Therapy, Dance/Movement Therapy   Participation Level:  Active  Participation Quality:  Appropriate, Sharing and Supportive  Affect:  Appropriate  Cognitive:  Appropriate  Insight:  Good  Engagement in Group:  Good  Engagement in Therapy:  Good  Modes of Intervention:  Clarification, Problem-solving, Role-play, Socialization and Support  Summary of Progress/Problems: Therapist and group members discussed self-sabotaging behaviors as well as healthy coping skills. Group members shared one positive thing to be thankful for today and how it is important to focus on the present. Pt offered support to another group member and stated, "we have to find things that make [place] better." Pt shared that she is trying to stay positive and wants to learn more about healthy coping skills.      Cassidi Long 03/10/2012. 3:20 PM

## 2012-03-10 NOTE — Research (Signed)
Psychoeducational Group Note  Date:  03/10/2012 Time:  1515  Group Topic/Focus:  Coping With Mental Health Crisis:   The purpose of this group is to help patients identify strategies for coping with mental health crisis.  Group discusses possible causes of crisis and ways to manage them effectively.  Participation Level:  Active  Participation Quality:  Attentive  Affect:  Appropriate  Cognitive:  Alert  Insight:  Good  Engagement in Group:  Good  Additional Comments:  Very active  Graceann Congress Celcia 03/10/2012, 7:04 PM

## 2012-03-10 NOTE — Progress Notes (Signed)
[facility] Family/Significant Other Suicide Prevention Education  Suicide Prevention Education:  Education Completed; Pt's mother Lynn Martinez [phone] has been identified by the patient as the family member/significant other with whom the patient will be residing, and identified as the person(s) who will aid the patient in the event of a mental health crisis (suicidal ideations/suicide attempt).  With written consent from the patient, the family member/significant other has been provided the following suicide prevention education, prior to the and/or following the discharge of the patient.  The suicide prevention education provided includes the following:  Suicide risk factors  Suicide prevention and interventions  National Suicide Hotline telephone number  [facility] assessment telephone number  [facility] Emergency Assistance 911  [facility] and/or Residential Mobile Crisis Unit telephone number  Request made of family/significant other to:  Remove weapons (e.g., guns, rifles, knives), all items previously/currently identified as safety concern.    Remove drugs/medications (over-the-counter, prescriptions, illicit drugs), all items previously/currently identified as a safety concern.  The family member/significant other verbalizes understanding of the suicide prevention education information provided.  The family member/significant other agrees to remove the items of safety concern listed above.  Lynn Martinez L 03/10/2012, 4:27 PM

## 2012-03-10 NOTE — Progress Notes (Signed)
[facility] MD Progress Note  03/10/2012 4:48 PM  S: My depression today is at #5, my anxiety at #7. Both both are gradually coming down. Group is very helpful. Group has thought me that every body is going through something".  Diagnosis:   Axis I: Major depression, recurrent Axis II: Deferred Axis III:  Past Medical History  Diagnosis Date  . Depression   . Hypertension   . Cancer 2005    THYROID  . Fibroid   . PMS (premenstrual syndrome)   . Hormone disorder   . PCOS (polycystic ovarian syndrome)   . Grave's disease   . 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 Anemia   . Seasonal allergies   . Hypothyroidism   . Headache     history - otc med prn  . Anxiety   . Vertigo    Axis IV: economic problems, housing problems, occupational problems and other psychosocial or environmental problems Axis V: 41-50 serious symptoms  ADL's:  Intact  Sleep: Fair  Appetite:  Good  Suicidal Ideation: "No" Plan:  No Intent:  No Means:  no Homicidal Ideation:  Plan:  No Intent:  no Means:  no  AEB (as evidenced by): per patient's reports  Mental Status Examination/Evaluation: Objective:  Appearance: Casual  Eye Contact::  Good  Speech:  Clear and Coherent  Volume:  Normal  Mood:  "okay, my depression is at #5 today"  Affect:  Flat  Thought Process:  Coherent and Intact  Orientation:  Full  Thought Content:  Rumination  Suicidal Thoughts:  No  Homicidal Thoughts:  No  Memory:  Immediate;   Good Recent;   Good Remote;   Good  Judgement:  Fair  Insight:  Fair  Psychomotor Activity:  Normal  Concentration:  Good  Recall:  Good  Akathisia:  No  Handed:  Right  AIMS (if indicated):     Assets:  Desire for Improvement  Sleep:  Number of Hours: 5.25    Vital Signs:Blood pressure 93/59, pulse 71, temperature 98.1 F (36.7 C), temperature source Oral, resp. rate 16, height 5' 6.14" (1.68 m), weight 108.863 kg (240 lb), last menstrual period  09/20/2011. Current Medications: Current Facility-Administered Medications  Medication Dose Route Frequency Provider Last Rate Last Dose  . acetaminophen (TYLENOL) tablet 650 mg  650 mg Oral Daily PRN Mike Craze, MD   650 mg at 03/10/12 0839  . alum & mag hydroxide-simeth (MAALOX/MYLANTA) 200-200-20 MG/5ML suspension 30 mL  30 mL Oral Q4H PRN Mike Craze, MD      . cholecalciferol (VITAMIN D) tablet 1,000 Units  1,000 Units Oral Daily Mike Craze, MD   1,000 Units at 03/10/12 [phone]  . cyanocobalamin ((VITAMIN B-12)) injection 1,000 mcg  1,000 mcg Intramuscular Q30 days Mike Craze, MD   1,000 mcg at 03/08/12 0955  . docusate sodium (COLACE) capsule 200 mg  200 mg Oral TID AC Mike Craze, MD   200 mg at 03/10/12 1255  . ferrous sulfate tablet 325 mg  325 mg Oral TID WC Mike Craze, MD   325 mg at 03/10/12 1255  . FLUoxetine (PROZAC) capsule 20 mg  20 mg Oral BID Mike Craze, MD   20 mg at 03/10/12 9604  . levothyroxine (SYNTHROID, LEVOTHROID) tablet 150 mcg  150 mcg Oral Daily Mike Craze, MD   150 mcg at 03/10/12 [phone]  . magnesium hydroxide (MILK OF MAGNESIA) suspension 30  mL  30 mL Oral Daily PRN Mike Craze, MD      . propranolol ER (INDERAL LA) 24 hr capsule 60 mg  60 mg Oral Daily Mike Craze, MD   60 mg at 03/09/12 1711  . traZODone (DESYREL) tablet 200 mg  200 mg Oral QHS Mike Craze, MD   200 mg at 03/09/12 2057    Lab Results:  Results for orders placed during the hospital encounter of 03/07/12 (from the past 48 hour(s))  T3, FREE     Status: Normal   Collection Time   03/08/12  8:38 PM      Component Value Range Comment   T3, Free 2.9  2.3 - 4.2 pg/mL   T4, FREE     Status: Normal   Collection Time   03/08/12  8:38 PM      Component Value Range Comment   Free T4 1.18  0.80 - 1.80 ng/dL   HEMOGLOBIN E4V     Status: Abnormal   Collection Time   03/08/12  8:38 PM      Component Value Range Comment   Hemoglobin A1C 5.9 (*) <5.7 %    Mean Plasma  Glucose 123 (*) <117 mg/dL     Physical Findings: AIMS: Facial and Oral Movements Muscles of Facial Expression: None, normal Lips and Perioral Area: None, normal Jaw: None, normal Tongue: None, normal,Extremity Movements Upper (arms, wrists, hands, fingers): None, normal Lower (legs, knees, ankles, toes): None, normal, Trunk Movements Neck, shoulders, hips: None, normal, Overall Severity Severity of abnormal movements (highest score from questions above): None, normal Incapacitation due to abnormal movements: None, normal Patient's awareness of abnormal movements (rate only patient's report): No Awareness, Dental Status Current problems with teeth and/or dentures?: No Does patient usually wear dentures?: No  CIWA:    COWS:     Treatment Plan Summary: Daily contact with patient to assess and evaluate symptoms and progress in treatment Medication management  Plan: Continue current treatment plan.  Armandina Stammer I 03/10/2012, 4:48 PM

## 2012-03-10 NOTE — Progress Notes (Signed)
[facility] Group Notes:  (Counselor/Nursing/MHT/Case Management/Adjunct)  03/10/2012 4:42 AM  Type of Therapy:  Psychoeducational Skills  Participation Level:  Active  Participation Quality:  Attentive  Affect:  Anxious  Cognitive:  Appropriate  Insight:  Good  Engagement in Group:  Good  Engagement in Therapy:  Good  Modes of Intervention:  Education  Summary of Progress/Problems: The patient states that she had a bad start to the day since she felt shaky and sick to her stomach. As the day wore on however, she slowly began to feel better. Attending groups assisted with her recovery despite feeling unmotivated. Her goal for tomorrow is to wake up happy and not to allow one or two negative events to ruin the rest of her day.    Hazle Coca S 03/10/2012, 4:42 AM

## 2012-03-10 NOTE — Progress Notes (Signed)
Psychoeducational Group Note  Date: 03/10/2012 Time:  1015  Group Topic/Focus:  Identifying Needs:   The focus of this group is to help patients identify their personal needs that have been historically problematic and identify healthy behaviors to address their needs.  Participation Level:  Active  Participation Quality:  Appropriate  Affect:  Appropriate  Cognitive:  Appropriate  Insight:  Good  Engagement in Group:  Good  Additional Comments:    Keviana Guida A  

## 2012-03-10 NOTE — Progress Notes (Signed)
Patient has been isolative to her room mostly but, attended group this evening. Patient currently c/o lower back and knee pain in which tylenol was given.  Patient rates her depression and hopelessness an 8. Patient reports passive si and verbally contracts for safety,-hi/a/v hall. Support and encouragement offered, safety maintained on unit, will continue to monitor.

## 2012-03-10 NOTE — Progress Notes (Signed)
Patient has been up and active on the unit, she attended group and reports that her goal is to continue to think positive and try and be more positve on tomorrow. Patient reports that she enjoyed her groups during the day shift and they are helpful so she can apply some things to situations she is currently going through. Patient c/o aching and pain from her arthritis and was encouraged to take a warm bath and soak which she did. Patient reports that it helped some and was given heat packs to apply to aching knees. Patient encouraged to speak with doctor on tomorrow concerning her arthritis due to the fact that she has an order for tylenol daily prn and has already taken her daily dose before writer came in tonight. Patient currently denies si/hi/a/v hall. Support and encouragement offered, safety maintained on unit with 15 min checks, will continue to monitor.

## 2012-03-10 NOTE — Progress Notes (Signed)
D) Pt has attended the program, participates and interacts with her peers. Complained of a headache this morning and was given tylenol. Rates her depression and hopelessness both at a 7 and her anxiety at an 8. Denies SI and HI today. States she really  Just doesn't know what she is going to do. Has a lot of unanswered questions and concerns. A) Given support, reassurance and praise. Encouraged to write her issues out so that she could start working on each on individually. R)States she continues to feel anxious and is having a hard time concentrating.

## 2012-03-11 NOTE — Progress Notes (Signed)
Psychoeducational Group Note  Date:  03/11/2012 Time:  1515  Group Topic/Focus:  Conflict Resolution:   The focus of this group is to discuss the conflict resolution process and how it may be used upon discharge.  Participation Level:  Did Not Attend  Participation Quality:    Affect:    Cognitive:    Insight:    Engagement in Group:    Additional Comments:  Pt was sleeping majority of the day.  Isla Pence M 03/11/2012, 7:56 PM

## 2012-03-11 NOTE — Progress Notes (Addendum)
D) Pt states that she is feeling much better. States she has gotten a lot out of group and feels that at this point she is ready to give herself a break and not be so hard on herself Rates her depression at a 5 and her hopelessness at a 4. Spoke up in group saying that she always hid the fact that she was depressed from others and she is not going to do that anymore.  A) Given support reassurance and praise. Given encouragement to continue to grow and change and to look at herself in a new way. Provided with a 1:1.  R) Denies SI and HI. States that she is beginning to feel empowered.

## 2012-03-11 NOTE — Progress Notes (Signed)
Psychoeducational Group Note  Date:  03/11/2012 Time:  1015  Group Topic/Focus:  Making Healthy Choices:   The focus of this group is to help patients identify negative/unhealthy choices they were using prior to admission and identify positive/healthier coping strategies to replace them upon discharge.  Participation Level:  Active  Participation Quality:  Attentive  Affect:  Appropriate  Cognitive:  Alert  Insight:  Good  Engagement in Group:  Good  Additional Comments:  Pt was attentive throughout the group and participated fully. Stated that she had her eyes opened in the group because she was able to identify that she really has more to offer than she gives herself credit for and that she has talents that she is going to acknowledge from now on.  Dione Housekeeper 03/11/2012

## 2012-03-11 NOTE — Progress Notes (Signed)
Patient ID: Lynn Martinez, female   DOB: 1970/06/19, 41 y.o.   MRN: [id number]   [facility] Group Notes:  (Counselor/Nursing/MHT/Case Management/Adjunct)  03/11/2012 1:15 PM  Type of Therapy:  Group Therapy, Dance/Movement Therapy   Participation Level:  Active  Participation Quality:  Appropriate  Affect:  Appropriate  Cognitive:  Appropriate  Insight:  Good  Engagement in Group:  Good  Engagement in Therapy:  Good  Modes of Intervention:  Clarification, Problem-solving, Role-play, Socialization and Support  Summary of Progress/Problems: Therapist and group members discussed healthy support systems and the importance of figuring out who we are and what we need. Group members shared experiences and ways they feel supported. Pt shared that she needs safety in her support system. Pt was very insightful and decided that was not going to call her parents until she felt better prepared to handle their responses.     Cassidi Long 03/11/2012. 3:13 PM

## 2012-03-11 NOTE — Progress Notes (Signed)
[facility] In Patient Progress Note 03/12/2012 10:35 PM Lynn Martinez September 04, 1970 [id number] Hospital day #:5 Diagnosis:  Diagnosis:  Axis I: Major Depression, Recurrent severe  Axis II: Deferred  Axis III:  Past Medical History   Diagnosis  Date   .  Depression    .  Hypertension    .  Cancer  2005     THYROID   .  Fibroid    .  PMS (premenstrual syndrome)    .  Hormone disorder    .  PCOS (polycystic ovarian syndrome)    .  Grave's disease    .  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  Anemia    .  Seasonal allergies    .  Hypothyroidism    .  Headache      history - otc med prn   .  Anxiety    .  Vertigo    Axis IV: other psychosocial or environmental problems  Axis V: 21-30 behavior considerably influenced by delusions or hallucinations OR serious impairment in judgment, communication OR inability to function in almost all areas   ADL's:  intact Sleep: good Appetite:good  Groups: patient reports 90%  Subjective: Patient states she is doing well and has no new complaints. She states she is tolerating her medication well and has no side effects/   height is 5' 6.14" (1.68 m) and weight is 108.863 kg (240 lb). Her oral temperature is 98 F (36.7 C). Her blood pressure is 124/77 and her pulse is 77. Her respiration is 18.   Objective: She is up and active in the unit milieu and states her racing thoughts are still there. She is also very somatically fixated and her reports of "pain" are significantly less than what she is reporting.  Ros: Constitutional: WD WN Adult in NAD ENT:      negative for runny nose, sore throat, congestion, dysphagia COR:     negative for cough, SOB, chest pain, wheezing GI:         negative for Nausea, vomiting, diarrhea, constipation, abdominal pain Neuro:  negative for dizziness, blurred vision, headaches, numbness or tingling Ortho:   negative for limb pain, swelling, change in ambulatory status.  Mental  Status Exam Level of Consciousness: awake Orientation: x 3 General Appearance:  casual Behavior:  cooperative Eye Contact:  good Motor Behavior:  normal Speech:  clear Mood:  Mildly depressed,  Suicidal Ideation: No suicidal ideation, no plan, no intent, no means. Homicidal Ideation:  No homicidal ideation, no plan, no intent, no means.  Affect:  congruent Anxiety Level: moderate, Thought Process:  linear Thought Content:  No AVH Perception:  intact Judgment:  fair, Insight: fair,  Cognition:  Below average, average, above average Sleep:  Number of Hours: 6  Lab Results: No results found for this or any previous visit (from the past 48 hour(s)). Labs are reviewed. Physical Findings: AIMS:   CIWA:    COWS:    Medication:  . cholecalciferol  1,000 Units Oral Daily  . cyanocobalamin  1,000 mcg Intramuscular Q30 days  . docusate sodium  200 mg Oral TID AC  . ferrous sulfate  325 mg Oral TID WC  . FLUoxetine  20 mg Oral BID  . levothyroxine  150 mcg Oral Daily  . propranolol ER  60 mg Oral Daily  . traZODone  200 mg Oral QHS   Treatment Plan Summary: 1. Admit for crisis  management and stabilization. 2. Medication management to reduce current symptoms to base line and improve the patient's overall level of functioning 3. Treat health problems as indicated. 4. Develop treatment plan to decrease risk of relapse upon discharge and the need for readmission. 5. Psycho-social education regarding relapse prevention and self care. 6. Health care follow up as needed for medical problems. 7. Restart home medications where appropriate.   Plan: 1. Continue to follow current plan of care. Rona Ravens. Dwaine Pringle PAC 03/12/2012, 10:35 PM

## 2012-03-11 NOTE — Progress Notes (Signed)
Patient ID: Lynn Martinez, female   DOB: [birth year], 41 y.o.   MRN: [id number]  Pt. attended and participated in aftercare planning group. Pt. verbally accepted information on suicide prevention, warning signs to look for with suicide and crisis line numbers to use. Pt shared that she is in pain and the tylenol is not enough. Pt shared that she has been trying alternatives such as heat pack and soaking. Pt also expressed that she is nervous to speak to her family.

## 2012-03-11 NOTE — Progress Notes (Signed)
[facility] Group Notes:  (Counselor/Nursing/MHT/Case Management/Adjunct)  03/11/2012 2:28 AM  Type of Therapy:  Psychoeducational Skills  Participation Level:  Active  Participation Quality:  Appropriate  Affect:  Appropriate  Cognitive:  Appropriate  Insight:  Good  Engagement in Group:  Good  Engagement in Therapy:  Good  Modes of Intervention:  Education  Summary of Progress/Problems:Patient stated that her day didn't start off that well, but that things improved especially after she attended her groups. Her goal for tomorrow is to address her pain and to continue to work on her depression and anxiety.    Alesi Zachery S 03/11/2012, 2:28 AM

## 2012-03-11 NOTE — Progress Notes (Signed)
Patient has been up and active on the unit, attended group this evening and complaints of arthritis pain and requested tylenol for her pain. Writer encouraged patient to speak to doctor concerning medication to help manage her arthritis pain. Patient reports that sometimes when she is in a good mood her pain causes her to feel bad.  Patient currently denies having pain, -si/hi/a/v hall. Support and encouragement offered, safety maintained on unit, will continue to monitor.

## 2012-03-12 DIAGNOSIS — M545 Low back pain, unspecified: Secondary | ICD-10-CM

## 2012-03-12 MED ORDER — HYDROXYZINE HCL 50 MG PO TABS: 50.0000 mg | ORAL_TABLET | Freq: Three times a day (TID) | ORAL | Status: DC | PRN

## 2012-03-12 MED ORDER — ACETAMINOPHEN 325 MG PO TABS
650.0000 mg | ORAL_TABLET | Freq: Four times a day (QID) | ORAL | Status: DC | PRN
  Administered 2012-03-12 – 2012-03-13 (×2): 650 mg via ORAL
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
## 2012-03-12 NOTE — Progress Notes (Signed)
I agree with this note.  

## 2012-03-12 NOTE — Progress Notes (Signed)
[facility] Group Notes:  (Counselor/Nursing/MHT/Case Management/Adjunct)  03/12/2012 2:42 AM  Type of Therapy:  Psychoeducational Skills  Participation Level:  Active  Participation Quality:  Attentive  Affect:  Appropriate  Cognitive:  Appropriate  Insight:  Good  Engagement in Group:  Good  Engagement in Therapy:  Good  Modes of Intervention:  Education  Summary of Progress/Problems: The patient verbalized that she had a great day and that she spent some time thinking about how she intends to define herself. Her goal for tomorrow is to complete the worksheet that was assigned to her.    Mattingly Fountaine S 03/12/2012, 2:42 AM

## 2012-03-12 NOTE — Progress Notes (Signed)
Psychoeducational Group Note  Date:  03/12/2012 Time:  1100  Group Topic/Focus:  Self Care:   The focus of this group is to help patients understand the importance of self-care in order to improve or restore emotional, physical, spiritual, interpersonal, and financial health.  Participation Level:  Did Not Attend  Participation Quality:    Affect:    Cognitive:    Insight:    Engagement in Group:    Additional Comments:  none  Marquis Lunch, Nathanyel Defenbaugh 03/12/2012, 3:03 PM

## 2012-03-12 NOTE — Progress Notes (Signed)
Group Note  Date:  03/12/2012 Time:  1:15  Group Topic/Focus:  Overcoming obstacles to wellness  Additional Comments:  Did not attend group.  Ryen Heitmeyer S 03/12/2012, 3:01 PM 

## 2012-03-12 NOTE — Progress Notes (Signed)
D:  Lynn Martinez is rating her depression at 3/10 and hopelessness at 4/10.  She reports that she slept well and that her appetite is good.  She denies SI/HI/AVH.  She complains of pain,  has Tylenol ordered once a day as needed.  She is interacting appropriately with staff and other patients.  A:  Medications given as ordered, emotional support given.  Safety checks q 15 minutes. R:  Safety maintained on unit.

## 2012-03-12 NOTE — Progress Notes (Signed)
Reviewed the progress note and agree with the treatment plan.

## 2012-03-12 NOTE — Progress Notes (Signed)
Patient seen during d/c planning group.  She reports being better and denies SI/HI.  She rates depression at three, anxiety at two and hopelessness/helplessness at zero.  Patient states she is having problems with pain and rates it at seven. Patient advised she plans to talk with MD about pain medication.

## 2012-03-12 NOTE — Tx Team (Signed)
Interdisciplinary Treatment Plan Update (Adult)  Date:  03/12/2012  Time Reviewed:  10:49 AM   Progress in Treatment: Attending groups: Yes Participating in groups:  Yes Taking medication as prescribed:  Yes Tolerating medication: Yes Family/Significant othe contact made:  Yes, contact made with her mother Patient understands diagnosis: Yes Discussing patient identified problems/goals with staff:  Yes Medical problems stabilized or resolved: Yes Denies suicidal/homicidal ideation: Yes Issues/concerns per patient self-inventory:  No  Other:  New problem(s) identified: None  Reason for Continuation of Hospitalization: Anxiety,  Medication Stabilization  Interventions implemented related to continuation of hospitalization:  Medication stabilization, safety checks q 15 mins, group attendance  Additional comments:  Estimated length of stay: 1-2 days  Discharge Plan: Violetta will discharge home and follow up with [facility]  New goal(s):  Review of initial/current patient goals per problem list:   1. Goal(s): Eliminate SI/other thoughts of self harm  Met: Yes Target date: d/c  As evidenced by: Lynn Martinez reports she is not having thoughts of self-harm   2. Goal (s): Reduce depression/anxiety to a rating of 4 or less  Met: No Target date: d/c  As evidenced by: Lynn Martinez rates depression at 3 but anxiety is still at 6   3. Goal(s): .stabilize on meds  Met: No  Target date: d/c  As evidenced by: Lynn Martinez would like to have her medications changed as she does not believe they are effectively controlling her symptoms   4. Goal(s): Refer for outpatient follow up  Met: Yes Target date: d/c  As evidenced by: Lynn Martinez has appointments in place with her existing providers     Attendees: Patient:  Lynn Martinez 03/12/2012 10:49 AM  Family:     Physician:  Dr Orson Aloe, MD 03/12/2012 10:49 AM  Nursing:   Chinita Greenland, RN 03/12/2012 10:49 AM  Case  Manager:  Juline Patch, LCSW 03/12/2012 10:49 AM  Counselor:  Angus Palms, LCSW 03/12/2012 10:49 AM  Other:  Neill Loft, RN 03/12/2012 10:49 AM  Other:  Kathlee Nations, RN 03/12/2012 10:49 AM  Other:     Other:      Scribe for Treatment Team:   Billie Lade, 03/12/2012 10:49 AM

## 2012-03-12 NOTE — Progress Notes (Signed)
Patient ID: Lynn Martinez, female   DOB: 1970/10/02, 41 y.o.   MRN: [id number] D: Pt. C/o of almost generalized body aches and pain during the day and referred to them at this time, asking for tylenol with her HS meds.  When told she would have to wait (ordered as Q 6H PRN). Pt. Readily agreed, took her HS meds and went to bed.  Pt. Is pleasant, smiling frequently this evening and is cooperative with staff and peers. A: Pt. Has numerous mild somatic c/o pain which she says is alleviated by Tylenol.  Pt. Also is depressed, but does not speak of her issues tonight with this Clinical research associate. R: Pt. May be responding well to her medications and will continue to monitor for changes.

## 2012-03-13 ENCOUNTER — Other Ambulatory Visit: Payer: Self-pay

## 2012-03-13 DIAGNOSIS — F489 Nonpsychotic mental disorder, unspecified: Secondary | ICD-10-CM | POA: Diagnosis present

## 2012-03-13 DIAGNOSIS — F5105 Insomnia due to other mental disorder: Secondary | ICD-10-CM | POA: Diagnosis present

## 2012-03-13 LAB — TSH: TSH: 9.125 u[IU]/mL — ABNORMAL HIGH (ref 0.350–4.500)

## 2012-03-13 MED ORDER — FERROUS SULFATE 325 (65 FE) MG PO TABS
325.0000 mg | ORAL_TABLET | Freq: Three times a day (TID) | ORAL | Status: DC
  Administered 2012-03-14 (×2): 325 mg via ORAL
  Filled 2012-03-13 (×7): qty 1

## 2012-03-13 MED ORDER — HALOPERIDOL 1 MG PO TABS
1.0000 mg | ORAL_TABLET | Freq: Two times a day (BID) | ORAL | Status: DC
  Administered 2012-03-13 – 2012-03-14 (×2): 1 mg via ORAL
  Filled 2012-03-13: qty 1
  Filled 2012-03-13 (×2): qty 28
  Filled 2012-03-13 (×5): qty 1

## 2012-03-13 MED ORDER — AMITRIPTYLINE HCL 25 MG PO TABS
25.0000 mg | ORAL_TABLET | Freq: Every day | ORAL | Status: DC
  Administered 2012-03-13: 25 mg via ORAL
  Filled 2012-03-13 (×3): qty 1
  Filled 2012-03-13: qty 14

## 2012-03-13 MED ORDER — PRAZOSIN HCL 1 MG PO CAPS
1.0000 mg | ORAL_CAPSULE | Freq: Every evening | ORAL | Status: DC | PRN
  Administered 2012-03-13 (×2): 1 mg via ORAL
  Filled 2012-03-13: qty 1
  Filled 2012-03-13: qty 28
  Filled 2012-03-13 (×5): qty 1
  Filled 2012-03-13: qty 28

## 2012-03-13 NOTE — Progress Notes (Signed)
I agree with this note from this date.

## 2012-03-13 NOTE — Progress Notes (Signed)
D: Pt denies SI/HI/AV. Pt is pleasant and cooperative. Pt rates depression at a 4, anxiety at a 5, and Helplessness/hopelessness at a 3. Pt seemed a little flat this morning but brightened as the day went on.   A: Pt was offered support and encouragement. Pt was given scheduled medications. Pt was encourage to attend groups. Q 15 minute checks were done for safety.  R:Pt attends groups and interacts well with peers and staff. Pt taking medication. Pt has no complaints.Pt receptive to treatment and safety maintained on unit.

## 2012-03-13 NOTE — Progress Notes (Signed)
Psychoeducational Group Note  Date:  03/13/2012 Time: 2000  Group Topic/Focus:  Wrap-up group  Participation Level:  Did Not Attend  Participation Quality:  N/A Did not attend wrap-up group  Affect:    Cognitive:    Insight:    Engagement in Group:    Additional Comments:  [name]. Patient remained in bed, awake for the duration of wrap-up group this evening.   Carlinda Ohlson, Newton Pigg 03/13/2012, 9:13 PM

## 2012-03-13 NOTE — Progress Notes (Signed)
[facility] MD Progress Note  03/13/2012 4:48 PM  S: "I get nightmares and my nipples are all the time erect.  My headaches are worse on the Prozac".  O: Trazodone could be causing the nightmares and the erect nipples.  The headaches are getting worse on regular consumption of Prozac.  Will stop. Will try Minipress for the nightmares and Elavil for the pains, depression, and anxiety.  Will try Haldol for an economical solution to her moods up and down.  Pt seems to be overly concerned with the emotions of others.  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  ROS: Neuro: no ataxia, weakness, but considerable trouble of late with headaches. GI: no N/V/D/cramps/constipation MS: no weakness, muscle cramps, aches.  Plan: Meds changes discussed above for the listed reasons.  Diagnosis:   Axis I: Major depression, recurrent Axis II: Deferred Axis III:  Past Medical History  Diagnosis Date  . Depression   . Hypertension   . Cancer 2005    THYROID  . Fibroid   . PMS (premenstrual syndrome)   . Hormone disorder   . PCOS (polycystic ovarian syndrome)   . Grave's disease   . 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 Anemia   . Seasonal allergies   . Hypothyroidism   . Headache     history - otc med prn  . Anxiety   . Vertigo    Axis IV: economic problems, housing problems, occupational problems and other psychosocial or environmental problems Axis V: 41-50 serious symptoms  ADL's:  Intact  Sleep: Poor, literally up every hour, yet staff observed 5.75 hours sleep.  Sleep per staff monitoring:Number of Hours: 5.75   Appetite:  Good  Suicidal Ideation: "No" Plan:  No Intent:  No Means:  no Homicidal Ideation:  Plan:  No Intent:  no Means:  no  AEB (as evidenced by): per patient's reports  Mental Status Examination/Evaluation: Objective:  Appearance: Casual  Eye Contact::  Good  Speech:  Clear and Coherent  Volume:  Normal  Mood:  "okay, my  depression is at #5 today"  Affect:  Flat  Thought Process:  Coherent and Intact  Orientation:  Full  Thought Content:  Rumination  Suicidal Thoughts:  No  Homicidal Thoughts:  No  Memory:  Immediate;   Good Recent;   Good Remote;   Good  Judgement:  Fair  Insight:  Fair  Psychomotor Activity:  Normal  Concentration:  Good  Recall:  Good  Akathisia:  No  Handed:  Right  AIMS (if indicated):     Assets:  Desire for Improvement  Sleep:  Number of Hours: 5.75    Vital Signs:Blood pressure 105/68, pulse 66, temperature 98.3 F (36.8 C), temperature source Oral, resp. rate 16, height 5' 6.14" (1.68 m), weight 108.863 kg (240 lb), last menstrual period 09/20/2011. Current Medications: Current Facility-Administered Medications  Medication Dose Route Frequency Provider Last Rate Last Dose  . acetaminophen (TYLENOL) tablet 650 mg  650 mg Oral Q6H PRN Norval Gable, NP   650 mg at 03/12/12 1759  . alum & mag hydroxide-simeth (MAALOX/MYLANTA) 200-200-20 MG/5ML suspension 30 mL  30 mL Oral Q4H PRN Mike Craze, MD      . cholecalciferol (VITAMIN D) tablet 1,000 Units  1,000 Units Oral Daily Mike Craze, MD   1,000 Units at 03/13/12 0820  . cyanocobalamin ((VITAMIN B-12)) injection 1,000 mcg  1,000 mcg Intramuscular Q30 days Mike Craze, MD  1,000 mcg at 03/08/12 0955  . docusate sodium (COLACE) capsule 200 mg  200 mg Oral TID AC Mike Craze, MD   200 mg at 03/13/12 1202  . ferrous sulfate tablet 325 mg  325 mg Oral TID WC Mike Craze, MD   325 mg at 03/13/12 1202  . FLUoxetine (PROZAC) capsule 20 mg  20 mg Oral BID Mike Craze, MD   20 mg at 03/13/12 0820  . haloperidol (HALDOL) tablet 1 mg  1 mg Oral BID Mike Craze, MD      . hydrOXYzine (ATARAX/VISTARIL) tablet 50 mg  50 mg Oral TID PRN Norval Gable, NP      . levothyroxine (SYNTHROID, LEVOTHROID) tablet 150 mcg  150 mcg Oral Daily Mike Craze, MD   150 mcg at 03/13/12 [phone]  . magnesium hydroxide (MILK OF  MAGNESIA) suspension 30 mL  30 mL Oral Daily PRN Mike Craze, MD      . prazosin (MINIPRESS) capsule 1 mg  1 mg Oral QHS,MR X 1 Mike Craze, MD      . propranolol ER (INDERAL LA) 24 hr capsule 60 mg  60 mg Oral Daily Mike Craze, MD   60 mg at 03/12/12 1750  . DISCONTD: traZODone (DESYREL) tablet 200 mg  200 mg Oral QHS Mike Craze, MD   200 mg at 03/12/12 2145    Lab Results:  Results for orders placed during the hospital encounter of 03/07/12 (from the past 48 hour(s))  TSH     Status: Abnormal   Collection Time   03/13/12  6:28 AM      Component Value Range Comment   TSH 9.125 (*) 0.350 - 4.500 uIU/mL     Physical Findings: AIMS: Facial and Oral Movements Muscles of Facial Expression: None, normal Lips and Perioral Area: None, normal Jaw: None, normal Tongue: None, normal,Extremity Movements Upper (arms, wrists, hands, fingers): None, normal Lower (legs, knees, ankles, toes): None, normal, Trunk Movements Neck, shoulders, hips: None, normal, Overall Severity Severity of abnormal movements (highest score from questions above): None, normal Incapacitation due to abnormal movements: None, normal Patient's awareness of abnormal movements (rate only patient's report): No Awareness, Dental Status Current problems with teeth and/or dentures?: No Does patient usually wear dentures?: No  CIWA:    COWS:     Treatment Plan Summary: Daily contact with patient to assess and evaluate symptoms and progress in treatment Medication management  Edon Hoadley 03/13/2012, 4:48 PM

## 2012-03-13 NOTE — Progress Notes (Signed)
[facility] Group Notes: (Counselor/Nursing/MHT/Case Management/Adjunct) 03/13/2012   @  1:15PM  Finding Balance in Life  Type of Therapy:  Group Therapy  Participation Level:  Active  Participation Quality: Sharing, Appropriate    Affect:  Blunted  Cognitive:  Appropriate  Insight:  Good  Engagement in Group: Good  Engagement in Therapy:  Good  Modes of Intervention:  Support and Exploration  Summary of Progress: Lynn Martinez was very engaged in group. She explored her concept of balance being things coming together in life so that one is able to "roll with" the turns life takes without losing control, and identified that her life has been completely unbalanced. When discussing dimensions of wellness, Lynn Martinez made the statement "No wonder I ended up in the hospital, I'm out of balance in all of those." She processed her experience with depression and how it has impacted her life in ways she did not even recognize until now. She was very receptive to the concept of balance being a spectrum rather than one spot. Lynn Martinez explored how she may find balance in her life, focusing on being intentional with her thoughts and adjusting her expectations of herself and others.   Angus Palms, LCSW 03/13/2012  4:36 PM

## 2012-03-13 NOTE — Progress Notes (Signed)
Patient attended after-care planning group this AM. She rated her depression at five, her anxiety at six and her hopelessness at 4/10. [facility] Writer confirmed that patient's next scheduled appointment is on 03/19/12 @ 3:30 PM. Patient can also return to the [facility] at discharge. She will need a bus pass at discharge and a supply of medications. Joice Lofts RN MS EdS 03/13/2012  2:25 PM

## 2012-03-14 DIAGNOSIS — F603 Borderline personality disorder: Secondary | ICD-10-CM | POA: Diagnosis present

## 2012-03-14 DIAGNOSIS — F429 Obsessive-compulsive disorder, unspecified: Secondary | ICD-10-CM | POA: Diagnosis present

## 2012-03-14 DIAGNOSIS — F4310 Post-traumatic stress disorder, unspecified: Secondary | ICD-10-CM

## 2012-03-14 MED ORDER — PROPRANOLOL HCL ER 60 MG PO CP24: 60.0000 mg | ORAL_CAPSULE | Freq: Every day | ORAL | Status: DC

## 2012-03-14 MED ORDER — VITAMIN D 1000 UNITS PO TABS: 1000.0000 [IU] | ORAL_TABLET | Freq: Every day | ORAL | Status: DC

## 2012-03-14 MED ORDER — DOCUSATE SODIUM 100 MG PO CAPS: 200.0000 mg | ORAL_CAPSULE | Freq: Every morning | ORAL | Status: DC

## 2012-03-14 MED ORDER — HALOPERIDOL 1 MG PO TABS: 1.0000 mg | ORAL_TABLET | Freq: Two times a day (BID) | ORAL | Status: DC

## 2012-03-14 MED ORDER — PRAZOSIN HCL 1 MG PO CAPS: 1.0000 mg | ORAL_CAPSULE | Freq: Every evening | ORAL | Status: DC | PRN

## 2012-03-14 MED ORDER — LEVOTHYROXINE SODIUM 150 MCG PO TABS: 150.0000 ug | ORAL_TABLET | Freq: Every day | ORAL | Status: DC

## 2012-03-14 MED ORDER — FERROUS SULFATE 325 (65 FE) MG PO TABS: 325.0000 mg | ORAL_TABLET | Freq: Every day | ORAL | Status: DC

## 2012-03-14 MED ORDER — AMITRIPTYLINE HCL 25 MG PO TABS: 25.0000 mg | ORAL_TABLET | Freq: Every day | ORAL | Status: DC

## 2012-03-14 MED ORDER — CYANOCOBALAMIN 1000 MCG/ML IJ SOLN: 1000.0000 ug | INTRAMUSCULAR | Status: DC

## 2012-03-14 NOTE — Progress Notes (Signed)
Psychoeducational Group Note  Date:  03/14/2012 Time:  1100  Group Topic/Focus:  Personal Choices and Values:   The focus of this group is to help patients assess and explore the importance of values in their lives, how their values affect their decisions, how they express their values and what opposes their expression.  Participation Level: Did Not Attend  Participation Quality:  Not Applicable  Affect:  Not Applicable  Cognitive:  Not Applicable  Insight:  Not Applicable  Engagement in Group: Not Applicable  Additional Comments:  Pt did not attend group, pt remained in bed.  Karleen Hampshire Brittini 03/14/2012, 2:12 PM
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
## 2012-03-14 NOTE — Progress Notes (Signed)
[facility] Case Management Discharge Plan:  Will you be returning to the same living situation after discharge: No. Patient reports she will stay with a friend.  She advised she could live with her mother but chooses not to due to their history.  At discharge, do you have transportation home?:No.  Patient will be assisted with a bus pass.  Do you have the ability to pay for your medications:No.  Patient to be assisted with indigent medications  Interagency Information:     Release of information consent forms completed and in the chart;  Patient's signature needed at discharge.  Patient to Follow up at:  Follow-up Information    [facility] with [facility] On 03/19/2012. (3:30 PM)    Contact information:   [address] [place], [place] [address] [phone] press "0" for operator      Follow up with [facility]. On 03/15/2012. (Please go to [facility] walk-in clinic on Thursday, March 15, 2012 or any weekday between 8AM-3PM)    Contact information:   [address] [place], [place]  [address]  [phone]         Patient denies SI/HI:   Yes,  Patient is currently denying SI/I    Safety Planning and Suicide Prevention discussed:  Yes,  Reviewed during aftercare groups  Barrier to discharge identified:Yes,  Homelessness, limited support system and unemployment  Summary and Recommendations: Patient encouraged to be compliant with medications and follow up with outpatient recommendations    Wynn Banker 03/14/2012, 10:43 AM

## 2012-03-14 NOTE — Tx Team (Signed)
Interdisciplinary Treatment Plan Update (Adult)  Date:  03/14/2012  Time Reviewed:  10:19 AM   Progress in Treatment: Attending groups: Yes Participating in groups:  Yes Taking medication as prescribed:  Yes Tolerating medication: Yes Family/Significant othe contact made:  Yes, contact made with: mom  Patient understands diagnosis: Yes Discussing patient identified problems/goals with staff:  Yes Medical problems stabilized or resolved: Yes Denies suicidal/homicidal ideation: Yes Issues/concerns per patient self-inventory:  No  Other:  New problem(s) identified: None  Reason for Continuation of Hospitalization: Appropriate for discharge today  Interventions implemented related to continuation of hospitalization:  Medication stabilization, safety checks q 15 mins, group attendance  Additional comments:  Estimated length of stay: discharge today  Discharge Plan: Liliana will discharge and  follow up with congregational social worker, Cathleen Fears, to work toward housing. She will be seen at [facility] for follow up care  New goal(s):  Review of initial/current patient goals per problem list:   1. Goal(s): Eliminate SI/other thoughts of self harm  Met: Yes  Target date: d/c  As evidenced by: Angelique Blonder reports she is not having thoughts of self-harm   2. Goal (s): Reduce depression/anxiety to a rating of 4 or less  Met: No  Target date: d/c  As evidenced by: Angelique Blonder rates depression at 3 but anxiety is still at a 5    3. Goal(s): .stabilize on meds  Met: Yes Target date: d/c  As evidenced by: Angelique Blonder reports medications have decreased her symptoms and she is not having intolerable side effects   4. Goal(s): Refer for outpatient follow up  Met: Yes  Target date: d/c  As evidenced by: Angelique Blonder has appointments in place with her existing providers   Attendees: Patient:  Denies Caldwell-Lewis 03/14/2012 10:19 AM  Family:     Physician:  Dr Orson Aloe, MD 03/14/2012 10:19 AM  Nursing:   Berneice Heinrich, RN 03/14/2012 10:19 AM  Case Manager:  Juline Patch, LCSW 03/14/2012 10:19 AM  Counselor:  Angus Palms, LCSW 03/14/2012 10:19 AM  Other:  Foye Clock, JMSW Intern 03/14/2012 10:19 AM  Other:  Liborio Nixon, RN 03/14/2012 10:19 AM  Other:  Darrel Reach, RN 03/14/2012 10:19 AM  Other:      Scribe for Treatment Team:   Billie Lade, 03/14/2012 10:19 AM

## 2012-03-14 NOTE — Progress Notes (Signed)
Patient denies SI/HI, denies A/V hallucinations. Patient verbalizes understanding of discharge instructions, follow up care and prescriptions. Patient given all belongings from [facility] locker. Patient escorted out by staff, transported by public transpotation.

## 2012-03-14 NOTE — Progress Notes (Signed)
Offered to take patient out to lockers to check for keys,but she refused stating that it wasn't going to make a difference to her discharge, that she would have to wait at her friend's house anyway.  She stated that it would just make her upset.

## 2012-03-14 NOTE — Progress Notes (Signed)
Patient did not attend group this evening. Writer entered patients room and observed her lying in bed resting. Patient was easily aroused when her name was called. Writer inquired as to how her day has been and patient reports that her day has been good. Patient rates her depression at a 5, hopelessness at a 3. Patient informed of her scheduled medications due at 2200 and is agreeable to taking them. Patient reports that since her medications have been changed she is not sure when she will be discharged since doctor wants to observe her on her new meds.  Patient currently denies having pain, -si/hi/a/v hall. Support and encouragement offered, safety maintained on unit, will continue to monitor.

## 2012-03-14 NOTE — Progress Notes (Signed)
[facility] Group Notes: (Counselor/Nursing/MHT/Case Management/Adjunct) 03/14/2012   @1 :15pm Breathing & Meditation for Anxiety/Anger   Type of Therapy:  Group Therapy  Participation Level:  Active  Participation Quality:  Appropriate, Sharing, Supportive  Affect:  Appropriate  Cognitive:  Appropriate  Insight:  Good  Engagement in Group: Good  Engagement in Therapy:  Good  Modes of Intervention:  Support and Exploration  Summary of Progress/Problems: Caetlin explored her experience with overwhelming emotions. She shared about trying to calm herself and think through problems she encounters, and identified herself as "a thinking person" rather than "a feeling person". Emmerson related well to discussion of wise mind, and was able to explore ways that living in either emotion or reason mind all the time could be negative at times. She processed ways that she tries to engage her intuition and emotions along with logic. Audie participated in color breathing and safe place guided imagery techniques and found them very calming.   Angus Palms, LCSW 03/14/2012  4:04 PM

## 2012-03-15 NOTE — Progress Notes (Signed)
Patient Discharge Instructions:  After Visit Summary (AVS):   Faxed to:  03/15/2012 Discharge Summary Note:   Faxed to:  03/15/2012 Suicide Risk Assessment - Discharge Assessment:   Faxed to:  03/15/2012 Faxed/Sent to the Next Level Care provider:  03/15/2012  Follow up @ [facility] fax @ [phone]   Karleen Hampshire Brittini, 03/15/2012, 3:35 PM

## 2012-04-23 NOTE — Discharge Summary (Signed)
Physician Discharge Summary Note  Patient:  Lynn Martinez is an 41 y.o., female MRN:  [id number] DOB:  30-Aug-1970 Patient phone:  [phone] (home)  Patient address:   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  Axis III:  Past Medical History   Diagnosis  Date   .  Depression    .  Hypertension    .  Cancer  2005     THYROID   .  Fibroid    .  PMS (premenstrual syndrome)    .  Hormone disorder    .  PCOS (polycystic ovarian syndrome)    .  Grave's disease    .  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  Anemia    .  Seasonal allergies    .  Hypothyroidism    .  Headache      history - otc med prn   .  Anxiety    .  Vertigo    Axis IV: economic problems, housing problems, occupational problems and other psychosocial or environmental problems  Axis V: 41-50 serious symptoms   Level of Care:  OP  Hospital Course:   This is a 41 year old African-American female, admitted to [facility] from the [facility] ED with complaints of suicidal ideations with plans to overdose on medications. Patient reports, "I went to the [facility] ED on the 24th of this month. It was recommended by my therapist because I was not coping well. I was having suicidal and homicidal thoughts. I have this sense of failure in my life. People are beginning to be so annoying to me. I am currently homeless, living in a shelter. I don't have a job, and no money to do anything  for me. I'm going through divorce proceedings with my husband who decided to dump me because of my ill health. My health is not good. I have had surgeries recently to remove my thyroid gland because of cancer. I had another surgery to remove my female organs, that rendered me childless for life. I can't have children, but my so soon to be ex-husband does want children. I can't give him any children. My depression has worsened because of my difficult situations. I have always been depressed.   While a patient in this hospital, Lynn Martinez was enrolled in group counseling and activities as well as received the following medication No current facility-administered medications for this encounter. Current outpatient prescriptions:acetaminophen (TYLENOL) 650 MG CR tablet, Take 650 mg by mouth daily as needed. Arthritis pain., Disp: , Rfl: ;  amitriptyline (ELAVIL) 25 MG tablet, Take 1 tablet (25 mg total) by mouth at bedtime. For sleep/pain, Disp: 30 tablet, Rfl: 0;  cholecalciferol (VITAMIN D) 1000 UNITS tablet, Take 1 tablet (1,000 Units total) by mouth daily. For supplemetation, Disp: 30 tablet, Rfl: 0 cyanocobalamin (,VITAMIN B-12,) 1000 MCG/ML injection, Inject 1 mL (1,000 mcg total) into the muscle every 30 (thirty) days. For supplementation, Disp: 10 mL, Rfl: 3;  docusate sodium (COLACE) 100 MG capsule, Take  2 capsules (200 mg total) by mouth every morning. For stool softner, Disp: 10 capsule, Rfl: ;  ferrous sulfate 325 (65 FE) MG tablet, Take 1 tablet (325 mg total) by mouth daily with breakfast. For low iron, Disp: , Rfl:  haloperidol (HALDOL) 1 MG tablet, Take 1 tablet (1 mg total) by mouth 2 (two) times daily. For thoughts, Disp: 60 tablet, Rfl: 0;  levothyroxine (SYNTHROID, LEVOTHROID) 150 MCG tablet, Take 1 tablet (150 mcg total) by mouth daily. For thyroid, Disp: 90 tablet, Rfl: 1;  prazosin (MINIPRESS) 1 MG capsule, Take 1 capsule (1 mg total) by mouth at bedtime and may repeat dose one  time if needed. For sleep/nightmares, Disp: 60 capsule, Rfl: 0 propranolol ER (INDERAL LA) 60 MG 24 hr capsule, Take 1 capsule (60 mg total) by mouth daily. For anxiety/blood pressure, Disp: 30 capsule, Rfl: 0  Patient attended treatment team meeting this am and met with treatment team members. Pt symptoms, treatment plan and response to treatment discussed. Apurva L Martinez endorsed that their symptoms have improved. Pt also stated that they are stable for discharge.  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 They will follow-up at  Follow-up Information    [facility] up with [facility]. On 03/19/2012. (3:30 PM)    Contact information:   [address] [place], [place] [address] [phone] press "0" for operator      Follow up with [facility]. On 03/15/2012. (Please go to [facility] walk-in clinic on Thursday, March 15, 2012 or any weekday between 8AM-3PM)    Contact information:   [address] [place], [place]  [address]  [phone]       .  In addition they were instructed to take all your medications as prescribed by your mental healthcare provider, to report any adverse effects and or reactions from your medicines to your outpatient provider promptly, patient is instructed and cautioned to not engage in alcohol and or illegal drug use while on prescription medicines, in the event of worsening symptoms, patient is instructed to call the crisis hotline, 911 and or go to the nearest ED for appropriate evaluation and treatment of symptoms.   Upon discharge, patient adamantly denies suicidal, homicidal ideations, auditory, visual hallucinations and or delusional thinking. They left [facility] with all  personal belongings in no apparent distress.  Consults:  See electronic record for details  Significant Diagnostic Studies:  See electronic record for details  Discharge Vitals:   Blood pressure 94/65, pulse 72, temperature 98.1 F (36.7 C), temperature source Oral, resp. rate 18, height 5' 6.14" (1.68 m), weight 240 lb (108.863 kg), last menstrual period 09/20/2011..  Mental Status Exam: See Mental Status Examination and Suicide Risk Assessment completed by Attending Physician prior to discharge.  Discharge destination:  Home  Is patient on multiple antipsychotic therapies at discharge:  No  Has Patient had three or more failed trials of antipsychotic monotherapy by history: N/A Recommended Plan for Multiple Antipsychotic Therapies: N/A    Medication List     As of 04/23/2012 12:50 PM    STOP taking these medications         FLUoxetine 20 MG capsule   Commonly known as: PROZAC      metoprolol 50 MG tablet   Commonly known as:  LOPRESSOR      traZODone 100 MG tablet   Commonly known as: DESYREL      TAKE these medications      Indication    acetaminophen 650 MG CR tablet   Commonly known as: TYLENOL   Take 650 mg by mouth daily as needed. Arthritis pain.       amitriptyline 25 MG tablet   Commonly known as: ELAVIL   Take 1 tablet (25 mg total) by mouth at bedtime. For sleep/pain       cholecalciferol 1000 UNITS tablet   Commonly known as: VITAMIN D   Take 1 tablet (1,000 Units total) by mouth daily. For supplemetation       cyanocobalamin 1000 MCG/ML injection   Commonly known as: (VITAMIN B-12)   Inject 1 mL (1,000 mcg total) into the muscle every 30 (thirty) days. For supplementation       docusate sodium 100 MG capsule   Commonly known as: COLACE   Take 2 capsules (200 mg total) by mouth every morning. For stool softner       ferrous sulfate 325 (65 FE) MG tablet   Take 1 tablet (325 mg total) by mouth daily with breakfast. For low iron       haloperidol  1 MG tablet   Commonly known as: HALDOL   Take 1 tablet (1 mg total) by mouth 2 (two) times daily. For thoughts       levothyroxine 150 MCG tablet   Commonly known as: SYNTHROID, LEVOTHROID   Take 1 tablet (150 mcg total) by mouth daily. For thyroid    Indication: Underactive Thyroid      prazosin 1 MG capsule   Commonly known as: MINIPRESS   Take 1 capsule (1 mg total) by mouth at bedtime and may repeat dose one time if needed. For sleep/nightmares       propranolol ER 60 MG 24 hr capsule   Commonly known as: INDERAL LA   Take 1 capsule (60 mg total) by mouth daily. For anxiety/blood pressure            Follow-up Information    Follow up with [facility]. On 03/19/2012. (3:30 PM)    Contact information:   [address] [place], [place] [address] [phone] press "0" for operator      Follow up with [facility]. On 03/15/2012. (Please go to [facility] walk-in clinic on Thursday, March 15, 2012 or any weekday between 8AM-3PM)    Contact information:   [address] [place], [place]  [address]  [phone]        Follow-up recommendations:   Activities: Resume typical activities Diet: Resume typical diet Tests: none Other: Follow up with outpatient provider and report any side effects to out patient prescriber.  Comments:  Take all your medications as prescribed by your mental healthcare provider. Report any adverse effects and or reactions from your medicines to your outpatient provider promptly. Patient is instructed and cautioned to not engage in alcohol and or illegal drug use while on prescription medicines. In the event of worsening symptoms, patient is instructed to call the crisis hotline, 911 and or go to the nearest ED for appropriate evaluation and treatment of symptoms. Follow-up with your primary care provider for your other medical issues, concerns and or health care needs.  SignedDan Humphreys, Stasha Naraine 04/23/2012 12:50 PM

## 2012-12-28 ENCOUNTER — Ambulatory Visit: Payer: Self-pay | Attending: Nurse Practitioner | Admitting: Rehabilitative and Restorative Service Providers"

## 2013-09-05 ENCOUNTER — Emergency Department (HOSPITAL_COMMUNITY): Payer: MEDICAID

## 2013-09-05 ENCOUNTER — Observation Stay (HOSPITAL_COMMUNITY)
Admission: EM | Admit: 2013-09-05 | Discharge: 2013-09-07 | Disposition: A | Discharge status: Home / Self Care | Payer: Self-pay | Attending: General Surgery | Admitting: General Surgery

## 2013-09-05 ENCOUNTER — Emergency Department (HOSPITAL_COMMUNITY): Payer: Self-pay

## 2013-09-05 ENCOUNTER — Encounter (HOSPITAL_COMMUNITY): Payer: Self-pay | Admitting: Emergency Medicine

## 2013-09-05 DIAGNOSIS — R42 Dizziness and giddiness: Secondary | ICD-10-CM | POA: Insufficient documentation

## 2013-09-05 DIAGNOSIS — F3130 Bipolar disorder, current episode depressed, mild or moderate severity, unspecified: Secondary | ICD-10-CM | POA: Insufficient documentation

## 2013-09-05 DIAGNOSIS — R1011 Right upper quadrant pain: Secondary | ICD-10-CM

## 2013-09-05 DIAGNOSIS — Z85850 Personal history of malignant neoplasm of thyroid: Secondary | ICD-10-CM | POA: Insufficient documentation

## 2013-09-05 DIAGNOSIS — K8010 Calculus of gallbladder with chronic cholecystitis without obstruction: Principal | ICD-10-CM | POA: Insufficient documentation

## 2013-09-05 DIAGNOSIS — I10 Essential (primary) hypertension: Secondary | ICD-10-CM | POA: Insufficient documentation

## 2013-09-05 DIAGNOSIS — K8020 Calculus of gallbladder without cholecystitis without obstruction: Secondary | ICD-10-CM

## 2013-09-05 DIAGNOSIS — R413 Other amnesia: Secondary | ICD-10-CM | POA: Insufficient documentation

## 2013-09-05 DIAGNOSIS — D649 Anemia, unspecified: Secondary | ICD-10-CM | POA: Insufficient documentation

## 2013-09-05 DIAGNOSIS — Z9049 Acquired absence of other specified parts of digestive tract: Secondary | ICD-10-CM | POA: Diagnosis present

## 2013-09-05 DIAGNOSIS — F4310 Post-traumatic stress disorder, unspecified: Secondary | ICD-10-CM | POA: Insufficient documentation

## 2013-09-05 DIAGNOSIS — E282 Polycystic ovarian syndrome: Secondary | ICD-10-CM | POA: Insufficient documentation

## 2013-09-05 DIAGNOSIS — E039 Hypothyroidism, unspecified: Secondary | ICD-10-CM | POA: Insufficient documentation

## 2013-09-05 DIAGNOSIS — K819 Cholecystitis, unspecified: Secondary | ICD-10-CM

## 2013-09-05 DIAGNOSIS — K8000 Calculus of gallbladder with acute cholecystitis without obstruction: Secondary | ICD-10-CM | POA: Diagnosis present

## 2013-09-05 DIAGNOSIS — Z79899 Other long term (current) drug therapy: Secondary | ICD-10-CM | POA: Insufficient documentation

## 2013-09-05 DIAGNOSIS — F429 Obsessive-compulsive disorder, unspecified: Secondary | ICD-10-CM | POA: Insufficient documentation

## 2013-09-05 HISTORY — DX: Bipolar disorder, unspecified: F31.9

## 2013-09-05 LAB — CBC
HCT: 36.5 % (ref 36.0–46.0)
Hemoglobin: 12.1 g/dL (ref 12.0–15.0)
MCH: 28 pg (ref 26.0–34.0)
MCHC: 33.2 g/dL (ref 30.0–36.0)
MCV: 84.5 fL (ref 78.0–100.0)
PLATELETS: 395 10*3/uL (ref 150–400)
RBC: 4.32 MIL/uL (ref 3.87–5.11)
RDW: 13.9 % (ref 11.5–15.5)
WBC: 7.6 10*3/uL (ref 4.0–10.5)

## 2013-09-05 LAB — BASIC METABOLIC PANEL
BUN: 10 mg/dL (ref 6–23)
CALCIUM: 9.7 mg/dL (ref 8.4–10.5)
CO2: 26 mEq/L (ref 19–32)
Chloride: 100 mEq/L (ref 96–112)
Creatinine, Ser: 0.71 mg/dL (ref 0.50–1.10)
Glucose, Bld: 102 mg/dL — ABNORMAL HIGH (ref 70–99)
POTASSIUM: 4.4 meq/L (ref 3.7–5.3)
SODIUM: 137 meq/L (ref 137–147)

## 2013-09-05 LAB — LIPASE, BLOOD: LIPASE: 16 U/L (ref 11–59)

## 2013-09-05 LAB — HEPATIC FUNCTION PANEL
ALBUMIN: 3.6 g/dL (ref 3.5–5.2)
ALT: 246 U/L — ABNORMAL HIGH (ref 0–35)
AST: 244 U/L — AB (ref 0–37)
Alkaline Phosphatase: 129 U/L — ABNORMAL HIGH (ref 39–117)
Bilirubin, Direct: 0.5 mg/dL — ABNORMAL HIGH (ref 0.0–0.3)
Indirect Bilirubin: 0.4 mg/dL (ref 0.3–0.9)
Total Bilirubin: 0.9 mg/dL (ref 0.3–1.2)
Total Protein: 7.5 g/dL (ref 6.0–8.3)

## 2013-09-05 LAB — TROPONIN I

## 2013-09-05 MED ORDER — ONDANSETRON HCL 4 MG/2ML IJ SOLN
4.0000 mg | Freq: Four times a day (QID) | INTRAMUSCULAR | Status: DC | PRN
  Administered 2013-09-05: 4 mg via INTRAVENOUS
  Filled 2013-09-05: qty 2

## 2013-09-05 MED ORDER — HYDROMORPHONE HCL PF 1 MG/ML IJ SOLN
1.0000 mg | INTRAMUSCULAR | Status: DC | PRN
  Administered 2013-09-05 – 2013-09-07 (×6): 1 mg via INTRAVENOUS
  Filled 2013-09-05 (×6): qty 1

## 2013-09-05 MED ORDER — ACETAMINOPHEN 325 MG PO TABS: 650.0000 mg | ORAL_TABLET | Freq: Four times a day (QID) | ORAL | Status: DC | PRN

## 2013-09-05 MED ORDER — KCL IN DEXTROSE-NACL 20-5-0.45 MEQ/L-%-% IV SOLN
INTRAVENOUS | Status: DC
  Administered 2013-09-05: 1000 mL via INTRAVENOUS
  Administered 2013-09-06 (×2): via INTRAVENOUS
  Filled 2013-09-05 (×5): qty 1000

## 2013-09-05 MED ORDER — ONDANSETRON HCL 4 MG/2ML IJ SOLN
4.0000 mg | Freq: Once | INTRAMUSCULAR | Status: AC
  Administered 2013-09-05: 4 mg via INTRAVENOUS
  Filled 2013-09-05: qty 2

## 2013-09-05 MED ORDER — SODIUM CHLORIDE 0.9 % IV BOLUS (SEPSIS)
1000.0000 mL | Freq: Once | INTRAVENOUS | Status: AC
  Administered 2013-09-05: 1000 mL via INTRAVENOUS

## 2013-09-05 MED ORDER — HYDROMORPHONE HCL PF 1 MG/ML IJ SOLN
1.0000 mg | INTRAMUSCULAR | Status: AC
  Administered 2013-09-05: 1 mg via INTRAVENOUS
  Filled 2013-09-05: qty 1

## 2013-09-05 MED ORDER — METRONIDAZOLE IN NACL 5-0.79 MG/ML-% IV SOLN
500.0000 mg | Freq: Once | INTRAVENOUS | Status: AC
  Administered 2013-09-05: 500 mg via INTRAVENOUS
  Filled 2013-09-05: qty 100

## 2013-09-05 MED ORDER — CIPROFLOXACIN IN D5W 400 MG/200ML IV SOLN
400.0000 mg | Freq: Two times a day (BID) | INTRAVENOUS | Status: DC
  Administered 2013-09-06 – 2013-09-07 (×3): 400 mg via INTRAVENOUS
  Filled 2013-09-05 (×5): qty 200

## 2013-09-05 MED ORDER — ACETAMINOPHEN 650 MG RE SUPP: 650.0000 mg | Freq: Four times a day (QID) | RECTAL | Status: DC | PRN

## 2013-09-05 MED ORDER — CIPROFLOXACIN IN D5W 400 MG/200ML IV SOLN
400.0000 mg | Freq: Once | INTRAVENOUS | Status: AC
  Administered 2013-09-05: 400 mg via INTRAVENOUS
  Filled 2013-09-05 (×2): qty 200

## 2013-09-05 NOTE — H&P (Signed)
Lynn Martinez is an 43 y.o. female.    General Surgery [facility]  Chief Complaint: RUQ abdominal pain, gallstones, elevated LFT's  HPI: Patient is a 44 yo BF with 12 hour hx of RUQ pain with acute onset this AM.  Mild nausea, no emesis.  Denies fever or chills.  Denies jaundice or acholic stools.  Pain persisted and came to ER for evaluation.  USN shows multiple gallstones with gallbladder wall thickening and positive sonographic Murphy's sign.  WBC normal.  Afebrile.  Elevated SGOT and SGPT with normal TBili.  Previous hx of TAH, BSO, and umbilical hernia repair as a child.  No previous history of hepatobiliary or pancreatic disease.  Past Medical History  Diagnosis Date  . Depression   . Hypertension   . Cancer 2005    THYROID  . Fibroid   . PMS (premenstrual syndrome)   . Hormone disorder   . PCOS (polycystic ovarian syndrome)   . Grave's disease   . 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 Anemia   . Seasonal allergies   . Hypothyroidism   . Headache(784.0)     history - otc med prn  . Anxiety   . Vertigo     Past Surgical History  Procedure Laterality Date  . Thyroidectomy    . Myomectomy  2010    LAPAROSCOPY  . Umbilical hernia repair  1982    as child  . Diagnostic laparoscopy    . Abdominal hysterectomy  10/11/2011    Procedure: HYSTERECTOMY ABDOMINAL;  Surgeon: Terrance Mass, MD;  Location: [facility];  Service: Gynecology;  Laterality: N/A;  With Repair of serosa.    Family History  Problem Relation Age of Onset  . Thyroid disease Mother   . Diabetes Father   . Hypertension Father   . Heart disease Father   . Cancer Maternal Aunt     BRAIN  . Cancer Maternal Grandmother     LYMPHOMA  . Hypertension Maternal Grandmother   . Cancer Paternal Grandmother     PANCREATIC  . Diabetes Paternal Grandmother   . Hypertension Paternal Grandmother   . Hypertension Maternal Grandfather   . Hypertension  Paternal Grandfather    Social History:  reports that she has never smoked. She has never used smokeless tobacco. She reports that she does not drink alcohol or use illicit drugs.  Allergies:  Allergies  Allergen Reactions  . Aspirin Hives  . Codeine Itching    Tolerates Hydrocodone OK.  . Ibuprofen Other (See Comments)    Happened in childhood  . Morphine Other (See Comments)    Throat swelling  . Penicillins Other (See Comments)    Childhood reaction     (Not in a hospital admission)  Results for orders placed during the hospital encounter of 09/05/13 (from the past 48 hour(s))  CBC     Status: None   Collection Time    09/05/13  3:44 PM      Result Value Ref Range   WBC 7.6  4.0 - 10.5 K/uL   Comment: WHITE COUNT CONFIRMED ON SMEAR   RBC 4.32  3.87 - 5.11 MIL/uL   Hemoglobin 12.1  12.0 - 15.0 g/dL   HCT 36.5  36.0 - 46.0 %   MCV 84.5  78.0 - 100.0 fL   MCH 28.0  26.0 - 34.0 pg   MCHC 33.2  30.0 - 36.0 g/dL   RDW  13.9  11.5 - 15.5 %   Platelets 395  150 - 400 K/uL  BASIC METABOLIC PANEL     Status: Abnormal   Collection Time    09/05/13  3:44 PM      Result Value Ref Range   Sodium 137  137 - 147 mEq/L   Potassium 4.4  3.7 - 5.3 mEq/L   Chloride 100  96 - 112 mEq/L   CO2 26  19 - 32 mEq/L   Glucose, Bld 102 (*) 70 - 99 mg/dL   BUN 10  6 - 23 mg/dL   Creatinine, Ser 0.71  0.50 - 1.10 mg/dL   Calcium 9.7  8.4 - 10.5 mg/dL   GFR calc non Af Amer >90  >90 mL/min   GFR calc Af Amer >90  >90 mL/min   Comment: (NOTE)     The eGFR has been calculated using the CKD EPI equation.     This calculation has not been validated in all clinical situations.     eGFR's persistently <90 mL/min signify possible Chronic Kidney     Disease.  HEPATIC FUNCTION PANEL     Status: Abnormal   Collection Time    09/05/13  3:44 PM      Result Value Ref Range   Total Protein 7.5  6.0 - 8.3 g/dL   Albumin 3.6  3.5 - 5.2 g/dL   AST 244 (*) 0 - 37 U/L   Comment: NO VISIBLE HEMOLYSIS    ALT 246 (*) 0 - 35 U/L   Alkaline Phosphatase 129 (*) 39 - 117 U/L   Total Bilirubin 0.9  0.3 - 1.2 mg/dL   Bilirubin, Direct 0.5 (*) 0.0 - 0.3 mg/dL   Comment: NO VISIBLE HEMOLYSIS   Indirect Bilirubin 0.4  0.3 - 0.9 mg/dL  LIPASE, BLOOD     Status: None   Collection Time    09/05/13  3:44 PM      Result Value Ref Range   Lipase 16  11 - 59 U/L  TROPONIN I     Status: None   Collection Time    09/05/13  3:44 PM      Result Value Ref Range   Troponin I <0.30  <0.30 ng/mL   Comment:            Due to the release kinetics of cTnI,     a negative result within the first hours     of the onset of symptoms does not rule out     myocardial infarction with certainty.     If myocardial infarction is still suspected,     repeat the test at appropriate intervals.   Dg Chest 2 View  09/05/2013   CLINICAL DATA:  Chest pain  EXAM: CHEST  2 VIEW  COMPARISON:  05/22/2011  FINDINGS: The heart size and mediastinal contours are within normal limits. Both lungs are clear. The visualized skeletal structures are unremarkable.  IMPRESSION: No active cardiopulmonary disease.   Electronically Signed   By: Daryll Brod M.D.   On: 09/05/2013 15:29   US Abdomen Limited Ruq  09/05/2013   CLINICAL DATA:  Right upper quadrant pain.  EXAM: US ABDOMEN LIMITED - RIGHT UPPER QUADRANT  COMPARISON:  None.  FINDINGS: Gallbladder:  Multiple gallstones are present. Gallbladder wall is 3 mm in thickness. No pericholecystic fluid identified. There is a positive sonographic Murphy sign.  Common bile duct:  Diameter: 5 mm  Liver:  Liver is mildly heterogeneous and echogenic without  focal lesion.  IMPRESSION: Gallstones and positive sonographic Murphy sign. Findings are consistent with acute cholecystitis.   Electronically Signed   By: Shon Hale M.D.   On: 09/05/2013 17:05    Review of Systems  Constitutional: Negative for fever, chills and diaphoresis.  HENT: Negative.   Eyes: Negative.   Respiratory: Negative.    Cardiovascular: Negative.   Gastrointestinal: Positive for nausea, abdominal pain and constipation. Negative for vomiting and diarrhea.  Genitourinary: Negative.   Musculoskeletal: Positive for back pain.  Skin: Negative for itching and rash.  Neurological: Negative.   Endo/Heme/Allergies: Negative.   Psychiatric/Behavioral: The patient is nervous/anxious.     Blood pressure 105/78, pulse 60, temperature 98 F (36.7 C), temperature source Oral, resp. rate 18, weight 240 lb (108.863 kg), last menstrual period 09/20/2011, SpO2 99.00%. Physical Exam  Constitutional: She is oriented to person, place, and time. She appears well-developed and well-nourished. No distress.  HENT:  Head: Normocephalic and atraumatic.  Right Ear: External ear normal.  Left Ear: External ear normal.  Mouth/Throat: No oropharyngeal exudate.  Eyes: Conjunctivae are normal. Pupils are equal, round, and reactive to light. No scleral icterus.  Neck: Normal range of motion. Neck supple. No thyromegaly present.  Well healed Kocher incision anterior neck  Cardiovascular: Normal rate, regular rhythm and normal heart sounds.   No murmur heard. Respiratory: Effort normal and breath sounds normal. She has no wheezes.  GI: Soft. Bowel sounds are normal. She exhibits no distension and no mass. There is tenderness (mild tenderness RUQ). There is no guarding.  Musculoskeletal: Normal range of motion. She exhibits no edema.  Neurological: She is alert and oriented to person, place, and time.  Skin: Skin is warm and dry.  Psychiatric: She has a normal mood and affect. Her behavior is normal.     Assessment/Plan Subacute cholecystitis, cholelithiasis, rule out choledocholithiasis  Admit to general surgery service  Clear liquid diet, NPO after midnight for possible OR Friday, 3/27  IV Cipro per ER  Dilaudid for pain tolerated by patient  Rx nausea prn  Repeat CBC, CMET in AM 3/27 - may need GI consult if LFT's continue to  rise  Discussed with patient and she agrees to admission and further evaluation, possible cholecystectomy this admission.  Earnstine Regal, MD, [facility] Office: [place] 09/05/2013, 6:40 PM

## 2013-09-05 NOTE — ED Provider Notes (Signed)
CSN: [id number]     Arrival date & time 09/05/13  1305 History   First MD Initiated Contact with Patient 09/05/13 1544     Chief Complaint  Patient presents with  . Anxiety  . Chest Pain     (Consider location/radiation/quality/duration/timing/severity/associated sxs/prior Treatment) Patient is a 43 y.o. female presenting with chest pain and abdominal pain. The history is provided by the patient.  Chest Pain Associated symptoms: abdominal pain   Associated symptoms: no back pain, no cough, no dizziness, no fatigue, no fever, no headache, no nausea, no shortness of breath and not vomiting   Abdominal Pain Pain location:  RUQ Pain quality comment:  Sharp Pain radiation: back. Pain severity:  Moderate Onset quality:  Sudden Duration:  11 hours Timing:  Constant Progression:  Improving Chronicity:  New Associated symptoms: chest pain   Associated symptoms: no cough, no diarrhea, no dysuria, no fatigue, no fever, no hematuria, no nausea, no shortness of breath and no vomiting     Past Medical History  Diagnosis Date  . Depression   . Hypertension   . Cancer 2005    THYROID  . Fibroid   . PMS (premenstrual syndrome)   . Hormone disorder   . PCOS (polycystic ovarian syndrome)   . Grave's disease   . 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 Anemia   . Seasonal allergies   . Hypothyroidism   . Headache(784.0)     history - otc med prn  . Anxiety   . Vertigo    Past Surgical History  Procedure Laterality Date  . Thyroidectomy    . Myomectomy  2010    LAPAROSCOPY  . Umbilical hernia repair  1982    as child  . Diagnostic laparoscopy    . Abdominal hysterectomy  10/11/2011    Procedure: HYSTERECTOMY ABDOMINAL;  Surgeon: Terrance Mass, MD;  Location: [place] [facility];  Service: Gynecology;  Laterality: N/A;  With Repair of serosa.   Family History  Problem Relation Age of Onset  . Thyroid disease Mother   . Diabetes Father   . Hypertension Father    . Heart disease Father   . Cancer Maternal Aunt     BRAIN  . Cancer Maternal Grandmother     LYMPHOMA  . Hypertension Maternal Grandmother   . Cancer Paternal Grandmother     PANCREATIC  . Diabetes Paternal Grandmother   . Hypertension Paternal Grandmother   . Hypertension Maternal Grandfather   . Hypertension Paternal Grandfather    History  Substance Use Topics  . Smoking status: Never Smoker   . Smokeless tobacco: Never Used  . Alcohol Use: No     Comment: Socially    OB History   Grav Para Term Preterm Abortions TAB SAB Ect Mult Living   2 0   2  2   0     Review of Systems  Constitutional: Negative for fever and fatigue.  HENT: Negative for congestion and drooling.   Eyes: Negative for pain.  Respiratory: Negative for cough and shortness of breath.   Cardiovascular: Positive for chest pain.  Gastrointestinal: Positive for abdominal pain. Negative for nausea, vomiting and diarrhea.  Genitourinary: Negative for dysuria and hematuria.  Musculoskeletal: Negative for back pain, gait problem and neck pain.  Skin: Negative for color change.  Neurological: Negative for dizziness and headaches.  Hematological: Negative for adenopathy.  Psychiatric/Behavioral: Negative for behavioral problems.  All other systems reviewed and  are negative.      Allergies  Aspirin; Codeine; Ibuprofen; Morphine; and Penicillins  Home Medications   Current Outpatient Rx  Name  Route  Sig  Dispense  Refill  . cholecalciferol (VITAMIN D) 1000 UNITS tablet   Oral   Take 1 tablet (1,000 Units total) by mouth daily. For supplemetation   30 tablet   0   . EXPIRED: cyanocobalamin (,VITAMIN B-12,) 1000 MCG/ML injection   Intramuscular   Inject 1 mL (1,000 mcg total) into the muscle every 30 (thirty) days. For supplementation   10 mL   3   . docusate sodium (COLACE) 100 MG capsule   Oral   Take 2 capsules (200 mg total) by mouth every morning. For stool softner   10 capsule      .  ferrous sulfate 325 (65 FE) MG tablet   Oral   Take 1 tablet (325 mg total) by mouth daily with breakfast. For low iron         . haloperidol (HALDOL) 1 MG tablet   Oral   Take 1 tablet (1 mg total) by mouth 2 (two) times daily. For thoughts   60 tablet   0   . prazosin (MINIPRESS) 1 MG capsule   Oral   Take 1 capsule (1 mg total) by mouth at bedtime and may repeat dose one time if needed. For sleep/nightmares   60 capsule   0   . propranolol ER (INDERAL LA) 60 MG 24 hr capsule   Oral   Take 1 capsule (60 mg total) by mouth daily. For anxiety/blood pressure   30 capsule   0    BP 109/64  Pulse 62  Temp(Src) 98 F (36.7 C) (Oral)  Resp 16  Wt 240 lb (108.863 kg)  SpO2 99%  LMP 09/20/2011 Physical Exam  Nursing note and vitals reviewed. Constitutional: She is oriented to person, place, and time. She appears well-developed and well-nourished.  HENT:  Head: Normocephalic.  Mouth/Throat: Oropharynx is clear and moist. No oropharyngeal exudate.  Eyes: Conjunctivae and EOM are normal. Pupils are equal, round, and reactive to light.  Neck: Normal range of motion. Neck supple.  Cardiovascular: Normal rate, regular rhythm, normal heart sounds and intact distal pulses.  Exam reveals no gallop and no friction rub.   No murmur heard. Pulmonary/Chest: Effort normal and breath sounds normal. No respiratory distress. She has no wheezes.  Abdominal: Soft. Bowel sounds are normal. There is tenderness (mild ttp of RUQ). There is no rebound and no guarding.  Musculoskeletal: Normal range of motion. She exhibits no edema and no tenderness.  Normal appearing and symmetric lower extremities without tenderness to palpation.  2+ distal pulses.  Neurological: She is alert and oriented to person, place, and time.  Skin: Skin is warm and dry.  Psychiatric: She has a normal mood and affect. Her behavior is normal.    ED Course  Procedures (including critical care time) Labs Review Labs  Reviewed  BASIC METABOLIC PANEL - Abnormal; Notable for the following:    Glucose, Bld 102 (*)    All other components within normal limits  HEPATIC FUNCTION PANEL - Abnormal; Notable for the following:    AST 244 (*)    ALT 246 (*)    Alkaline Phosphatase 129 (*)    Bilirubin, Direct 0.5 (*)    All other components within normal limits  CBC  LIPASE, BLOOD  TROPONIN I  COMPREHENSIVE METABOLIC PANEL  CBC   Imaging Review Dg Chest  2 View  09/05/2013   CLINICAL DATA:  Chest pain  EXAM: CHEST  2 VIEW  COMPARISON:  05/22/2011  FINDINGS: The heart size and mediastinal contours are within normal limits. Both lungs are clear. The visualized skeletal structures are unremarkable.  IMPRESSION: No active cardiopulmonary disease.   Electronically Signed   By: Daryll Brod M.D.   On: 09/05/2013 15:29     EKG Interpretation   Date/Time:  Thursday September 05 2013 13:19:02 EDT Ventricular Rate:  63 PR Interval:  180 QRS Duration: 76 QT Interval:  411 QTC Calculation: 421 R Axis:   63 Text Interpretation:  Sinus rhythm ST elev, probable normal early repol  pattern No previous ECGs available Confirmed by Donielle Kaigler  MD, Ismail Graziani  ([id number]) on 09/05/2013 4:21:25 PM      MDM   Final diagnoses:  Cholecystitis    4:24 PM 43 y.o. female who presents with right upper quadrant pain which awoke her from sleep at approximately 6:30 AM this morning. She notes that the pain has been constant since that time with mild spontaneous relief. Currently a 7/10. She denies any shortness of breath or chest pain. The pain is reproducible with palpation of the right upper quadrant on exam. She does note that it radiates to her back and is worse w/ deep inspiration. Low risk per Wells/Perc for PE. She is afebrile and vital signs are unremarkable here. Will workup for gallbladder pathology. Will give Dilaudid for pain control.  Found to have cholecystitis. Ordered cipro/flagyl as pt has allergy to PCN in the system. [facility]  to admit.   Blanchard Kelch, MD 09/05/13 [phone]

## 2013-09-05 NOTE — ED Notes (Signed)
Charge RN at bedside for ultrasound IV.

## 2013-09-05 NOTE — ED Notes (Signed)
Pt alert, arrives from [facility] [place] c/o chest pain, associates with anxiety, though describes pain worse with inspiration, resp even unlabored, skin pwd, ambulates to triage

## 2013-09-05 NOTE — ED Notes (Signed)
Ultrasound at bedside

## 2013-09-05 NOTE — Progress Notes (Signed)
[facility] U[facility]OL[facility] [id number] Code Status: FULL Admission Data: 09/05/2013 7:46 PM LKG:[id number] Ronnald Ramp, MD  Lynn Martinez is a 43 y.o. female patient admitted from ED awake, alert - oriented  X 3 - no acute distress noted.  VSS - Blood pressure 109/58, pulse 62, temperature 97.8 F (36.6 C), temperature source Oral, resp. rate 13, weight 108.863 kg (240 lb), last menstrual period 09/20/2011, SpO2 99.00%.  no c/o shortness of breath, no c/o chest pain. IV Fluids:  IV in place, occlusive dsg intact without redness, IV cath antecubital right, condition patent and no redness  Allergies:   Allergies  Allergen Reactions  . Aspirin Hives  . Codeine Itching    Tolerates Hydrocodone OK.  . Ibuprofen Other (See Comments)    Happened in childhood  . Morphine Other (See Comments)    Throat swelling  . Penicillins Other (See Comments)    Childhood reaction     Past Medical History  Diagnosis Date  . Depression   . Hypertension   . Cancer 2005    THYROID  . Fibroid   . PMS (premenstrual syndrome)   . Hormone disorder   . PCOS (polycystic ovarian syndrome)   . Grave's disease   . 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 Anemia   . Seasonal allergies   . Hypothyroidism   . Headache(784.0)     history - otc med prn  . Anxiety   . Vertigo   . Bipolar disorder     (Not in a hospital admission)  Orientation to room, and floor completed with information packet given to patient/family.  Patient declined safety video at this time.  Admission INP armband ID verified with patient/family, and in place.   SR up x 2, fall assessment complete, with patient and family able to verbalize understanding of risk associated with falls, and verbalized understanding to call nsg before up out of bed.  Call light within reach, patient able to voice, and demonstrate understanding.  Skin, clean-dry- intact without evidence of bruising, or skin tears.   No evidence of skin break down  noted on exam.     Will cont to eval and treat per MD orders.  Vonna Kotyk, RN 09/05/2013 7:46 PM

## 2013-09-06 ENCOUNTER — Observation Stay (HOSPITAL_COMMUNITY): Payer: Self-pay

## 2013-09-06 ENCOUNTER — Observation Stay (HOSPITAL_COMMUNITY): Payer: Self-pay | Admitting: Registered Nurse

## 2013-09-06 ENCOUNTER — Encounter (HOSPITAL_COMMUNITY): Payer: MEDICAID | Admitting: Registered Nurse

## 2013-09-06 ENCOUNTER — Encounter (HOSPITAL_COMMUNITY)
Admission: EM | Disposition: A | Discharge status: Home / Self Care | Payer: Self-pay | Source: Home / Self Care | Attending: Emergency Medicine

## 2013-09-06 DIAGNOSIS — K8010 Calculus of gallbladder with chronic cholecystitis without obstruction: Secondary | ICD-10-CM

## 2013-09-06 DIAGNOSIS — K824 Cholesterolosis of gallbladder: Secondary | ICD-10-CM

## 2013-09-06 HISTORY — PX: CHOLECYSTECTOMY: SHX55

## 2013-09-06 LAB — CBC
HCT: 33.2 % — ABNORMAL LOW (ref 36.0–46.0)
Hemoglobin: 11 g/dL — ABNORMAL LOW (ref 12.0–15.0)
MCH: 27.7 pg (ref 26.0–34.0)
MCHC: 33.1 g/dL (ref 30.0–36.0)
MCV: 83.6 fL (ref 78.0–100.0)
PLATELETS: 374 10*3/uL (ref 150–400)
RBC: 3.97 MIL/uL (ref 3.87–5.11)
RDW: 13.9 % (ref 11.5–15.5)
WBC: 7 10*3/uL (ref 4.0–10.5)

## 2013-09-06 LAB — COMPREHENSIVE METABOLIC PANEL
ALK PHOS: 117 U/L (ref 39–117)
ALT: 189 U/L — AB (ref 0–35)
AST: 110 U/L — ABNORMAL HIGH (ref 0–37)
Albumin: 3.1 g/dL — ABNORMAL LOW (ref 3.5–5.2)
BILIRUBIN TOTAL: 0.5 mg/dL (ref 0.3–1.2)
BUN: 7 mg/dL (ref 6–23)
CALCIUM: 8.9 mg/dL (ref 8.4–10.5)
CO2: 26 mEq/L (ref 19–32)
Chloride: 101 mEq/L (ref 96–112)
Creatinine, Ser: 0.82 mg/dL (ref 0.50–1.10)
GFR, EST NON AFRICAN AMERICAN: 88 mL/min — AB (ref >90)
GLUCOSE: 130 mg/dL — AB (ref 70–99)
POTASSIUM: 4 meq/L (ref 3.7–5.3)
Sodium: 138 mEq/L (ref 137–147)
Total Protein: 6.8 g/dL (ref 6.0–8.3)

## 2013-09-06 SURGERY — LAPAROSCOPIC CHOLECYSTECTOMY WITH INTRAOPERATIVE CHOLANGIOGRAM
Anesthesia: General

## 2013-09-06 MED ORDER — FENTANYL CITRATE 0.05 MG/ML IJ SOLN
INTRAMUSCULAR | Status: DC | PRN
  Administered 2013-09-06: 50 ug via INTRAVENOUS
  Administered 2013-09-06: 150 ug via INTRAVENOUS
  Administered 2013-09-06 (×3): 100 ug via INTRAVENOUS

## 2013-09-06 MED ORDER — GLYCOPYRROLATE 0.2 MG/ML IJ SOLN
INTRAMUSCULAR | Status: AC
  Filled 2013-09-06: qty 1

## 2013-09-06 MED ORDER — 0.9 % SODIUM CHLORIDE (POUR BTL) OPTIME
TOPICAL | Status: DC | PRN
  Administered 2013-09-06: 1000 mL

## 2013-09-06 MED ORDER — ONDANSETRON HCL 4 MG/2ML IJ SOLN
INTRAMUSCULAR | Status: DC | PRN
  Administered 2013-09-06: 4 mg via INTRAVENOUS

## 2013-09-06 MED ORDER — FENTANYL CITRATE 0.05 MG/ML IJ SOLN
INTRAMUSCULAR | Status: AC
  Filled 2013-09-06: qty 5

## 2013-09-06 MED ORDER — ACETAMINOPHEN 10 MG/ML IV SOLN
1000.0000 mg | Freq: Once | INTRAVENOUS | Status: DC
  Filled 2013-09-06: qty 100

## 2013-09-06 MED ORDER — METOCLOPRAMIDE HCL 5 MG/ML IJ SOLN
INTRAMUSCULAR | Status: DC | PRN
  Administered 2013-09-06: 10 mg via INTRAVENOUS

## 2013-09-06 MED ORDER — LEVOTHYROXINE SODIUM 100 MCG PO TABS
100.0000 ug | ORAL_TABLET | Freq: Every day | ORAL | Status: DC
  Administered 2013-09-07: 100 ug via ORAL
  Filled 2013-09-06 (×2): qty 1

## 2013-09-06 MED ORDER — DEXAMETHASONE SODIUM PHOSPHATE 10 MG/ML IJ SOLN
INTRAMUSCULAR | Status: DC | PRN
  Administered 2013-09-06: 10 mg via INTRAVENOUS

## 2013-09-06 MED ORDER — LIDOCAINE HCL (CARDIAC) 20 MG/ML IV SOLN
INTRAVENOUS | Status: AC
  Filled 2013-09-06: qty 5

## 2013-09-06 MED ORDER — HYDROMORPHONE HCL PF 1 MG/ML IJ SOLN
INTRAMUSCULAR | Status: AC
  Filled 2013-09-06: qty 1

## 2013-09-06 MED ORDER — LACTATED RINGERS IR SOLN
Status: DC | PRN
  Administered 2013-09-06: 1000 mL

## 2013-09-06 MED ORDER — MECLIZINE HCL 25 MG PO TABS
25.0000 mg | ORAL_TABLET | Freq: Every day | ORAL | Status: DC
  Filled 2013-09-06: qty 1

## 2013-09-06 MED ORDER — PROPOFOL 10 MG/ML IV BOLUS
INTRAVENOUS | Status: AC
  Filled 2013-09-06: qty 20

## 2013-09-06 MED ORDER — BUPIVACAINE-EPINEPHRINE 0.25% -1:200000 IJ SOLN
INTRAMUSCULAR | Status: DC | PRN
  Administered 2013-09-06: 27 mL

## 2013-09-06 MED ORDER — SUCCINYLCHOLINE CHLORIDE 20 MG/ML IJ SOLN
INTRAMUSCULAR | Status: DC | PRN
  Administered 2013-09-06: 100 mg via INTRAVENOUS

## 2013-09-06 MED ORDER — ACETAMINOPHEN 10 MG/ML IV SOLN
INTRAVENOUS | Status: DC | PRN
  Administered 2013-09-06: 1000 mg via INTRAVENOUS

## 2013-09-06 MED ORDER — LABETALOL HCL 5 MG/ML IV SOLN
INTRAVENOUS | Status: DC | PRN
  Administered 2013-09-06: 5 mg via INTRAVENOUS

## 2013-09-06 MED ORDER — ROCURONIUM BROMIDE 100 MG/10ML IV SOLN
INTRAVENOUS | Status: AC
  Filled 2013-09-06: qty 1

## 2013-09-06 MED ORDER — GLYCOPYRROLATE 0.2 MG/ML IJ SOLN
INTRAMUSCULAR | Status: DC | PRN
  Administered 2013-09-06: 0.2 mg via INTRAVENOUS
  Administered 2013-09-06: 0.6 mg via INTRAVENOUS

## 2013-09-06 MED ORDER — SODIUM CHLORIDE 0.9 % IJ SOLN
INTRAMUSCULAR | Status: AC
  Filled 2013-09-06: qty 10

## 2013-09-06 MED ORDER — MIDAZOLAM HCL 5 MG/5ML IJ SOLN
INTRAMUSCULAR | Status: DC | PRN
  Administered 2013-09-06: 2 mg via INTRAVENOUS

## 2013-09-06 MED ORDER — PRAZOSIN HCL 1 MG PO CAPS
1.0000 mg | ORAL_CAPSULE | Freq: Every evening | ORAL | Status: DC | PRN
  Filled 2013-09-06 (×4): qty 1

## 2013-09-06 MED ORDER — IOHEXOL 300 MG/ML  SOLN
INTRAMUSCULAR | Status: DC | PRN
  Administered 2013-09-06: 7.5 mL

## 2013-09-06 MED ORDER — ROCURONIUM BROMIDE 100 MG/10ML IV SOLN
INTRAVENOUS | Status: DC | PRN
  Administered 2013-09-06: 5 mg via INTRAVENOUS
  Administered 2013-09-06: 45 mg via INTRAVENOUS

## 2013-09-06 MED ORDER — PROPRANOLOL HCL 40 MG PO TABS
40.0000 mg | ORAL_TABLET | Freq: Every day | ORAL | Status: DC
  Filled 2013-09-06: qty 1

## 2013-09-06 MED ORDER — MIDAZOLAM HCL 2 MG/2ML IJ SOLN
INTRAMUSCULAR | Status: AC
  Filled 2013-09-06: qty 2

## 2013-09-06 MED ORDER — PROPOFOL 10 MG/ML IV BOLUS
INTRAVENOUS | Status: DC | PRN
  Administered 2013-09-06: 200 mg via INTRAVENOUS

## 2013-09-06 MED ORDER — LURASIDONE HCL 80 MG PO TABS
80.0000 mg | ORAL_TABLET | Freq: Every day | ORAL | Status: DC
  Filled 2013-09-06 (×2): qty 1

## 2013-09-06 MED ORDER — DOCUSATE SODIUM 100 MG PO CAPS
200.0000 mg | ORAL_CAPSULE | Freq: Every morning | ORAL | Status: DC
  Filled 2013-09-06: qty 2

## 2013-09-06 MED ORDER — LACTATED RINGERS IV SOLN
INTRAVENOUS | Status: DC
  Administered 2013-09-06: 1000 mL via INTRAVENOUS
  Administered 2013-09-06: 16:00:00 via INTRAVENOUS

## 2013-09-06 MED ORDER — BUPIVACAINE-EPINEPHRINE PF 0.25-1:200000 % IJ SOLN
INTRAMUSCULAR | Status: AC
  Filled 2013-09-06: qty 30

## 2013-09-06 MED ORDER — PROPRANOLOL HCL 20 MG PO TABS
20.0000 mg | ORAL_TABLET | Freq: Every day | ORAL | Status: DC
  Administered 2013-09-06: 20 mg via ORAL
  Filled 2013-09-06 (×2): qty 1

## 2013-09-06 MED ORDER — NEOSTIGMINE METHYLSULFATE 1 MG/ML IJ SOLN
INTRAMUSCULAR | Status: DC | PRN
  Administered 2013-09-06: 4 mg via INTRAVENOUS

## 2013-09-06 MED ORDER — ONDANSETRON HCL 4 MG/2ML IJ SOLN
INTRAMUSCULAR | Status: AC
  Filled 2013-09-06: qty 2

## 2013-09-06 MED ORDER — HYDROMORPHONE HCL PF 1 MG/ML IJ SOLN
0.2500 mg | INTRAMUSCULAR | Status: DC | PRN
Start: 2013-09-06 — End: 2013-09-06
  Administered 2013-09-06 (×2): 0.5 mg via INTRAVENOUS

## 2013-09-06 MED ORDER — LITHIUM CARBONATE 300 MG PO CAPS
300.0000 mg | ORAL_CAPSULE | Freq: Two times a day (BID) | ORAL | Status: DC
  Filled 2013-09-06 (×3): qty 1

## 2013-09-06 MED ORDER — LIDOCAINE HCL (CARDIAC) 20 MG/ML IV SOLN
INTRAVENOUS | Status: DC | PRN
  Administered 2013-09-06: 25 mg via INTRAVENOUS
  Administered 2013-09-06: 75 mg via INTRAVENOUS

## 2013-09-06 MED ORDER — GLYCOPYRROLATE 0.2 MG/ML IJ SOLN
INTRAMUSCULAR | Status: AC
  Filled 2013-09-06: qty 4

## 2013-09-06 MED ORDER — PROMETHAZINE HCL 25 MG/ML IJ SOLN: 6.2500 mg | INTRAMUSCULAR | Status: DC | PRN

## 2013-09-06 MED ORDER — METOPROLOL TARTRATE 1 MG/ML IV SOLN
5.0000 mg | Freq: Three times a day (TID) | INTRAVENOUS | Status: DC
Start: 2013-09-06 — End: 2013-09-07
  Administered 2013-09-06 – 2013-09-07 (×2): 5 mg via INTRAVENOUS
  Filled 2013-09-06 (×6): qty 5

## 2013-09-06 SURGICAL SUPPLY — 43 items
ADH SKN CLS APL DERMABOND .7 (GAUZE/BANDAGES/DRESSINGS) ×1
APPLIER CLIP ROT 10 11.4 M/L (STAPLE) ×2
APR CLP MED LRG 11.4X10 (STAPLE) ×1
BAG SPEC RTRVL LRG 6X4 10 (ENDOMECHANICALS) ×1
CANISTER SUCTION 2500CC (MISCELLANEOUS) ×1 IMPLANT
CATH REDDICK CHOLANGI 4FR 50CM (CATHETERS) ×2 IMPLANT
CHLORAPREP W/TINT 26ML (MISCELLANEOUS) ×2 IMPLANT
CLIP APPLIE ROT 10 11.4 M/L (STAPLE) ×1 IMPLANT
COVER MAYO STAND STRL (DRAPES) ×2 IMPLANT
DECANTER SPIKE VIAL GLASS SM (MISCELLANEOUS) ×2 IMPLANT
DERMABOND ADVANCED (GAUZE/BANDAGES/DRESSINGS) ×1
DERMABOND ADVANCED .7 DNX12 (GAUZE/BANDAGES/DRESSINGS) ×1 IMPLANT
DRAPE C-ARM 42X120 X-RAY (DRAPES) ×2 IMPLANT
DRAPE LAPAROSCOPIC ABDOMINAL (DRAPES) ×2 IMPLANT
ELECT REM PT RETURN 9FT ADLT (ELECTROSURGICAL) ×2
ELECTRODE REM PT RTRN 9FT ADLT (ELECTROSURGICAL) ×1 IMPLANT
GLOVE BIO SURGEON STRL SZ7.5 (GLOVE) ×4 IMPLANT
GLOVE BIOGEL PI IND STRL 7.0 (GLOVE) IMPLANT
GLOVE BIOGEL PI INDICATOR 7.0 (GLOVE) ×2
GLOVE SURG SIGNA 7.5 PF LTX (GLOVE) ×1 IMPLANT
GLOVE SURG SS PI 6.5 STRL IVOR (GLOVE) ×3 IMPLANT
GOWN STRL REUS W/ TWL XL LVL3 (GOWN DISPOSABLE) IMPLANT
GOWN STRL REUS W/TWL LRG LVL3 (GOWN DISPOSABLE) ×3 IMPLANT
GOWN STRL REUS W/TWL XL LVL3 (GOWN DISPOSABLE) ×3 IMPLANT
HEMOSTAT SURGICEL 4X8 (HEMOSTASIS) ×1 IMPLANT
IV CATH 14GX2 1/4 (CATHETERS) ×2 IMPLANT
IV LACTATED RINGERS 1000ML (IV SOLUTION) ×1 IMPLANT
KIT BASIN OR (CUSTOM PROCEDURE TRAY) ×2 IMPLANT
MANIFOLD NEPTUNE II (INSTRUMENTS) ×1 IMPLANT
NS IRRIG 1000ML POUR BTL (IV SOLUTION) ×1 IMPLANT
POUCH SPECIMEN RETRIEVAL 10MM (ENDOMECHANICALS) ×1 IMPLANT
SCISSORS ENDO CVD 5DCS (MISCELLANEOUS) ×1 IMPLANT
SET IRRIG TUBING LAPAROSCOPIC (IRRIGATION / IRRIGATOR) ×2 IMPLANT
SOLUTION ANTI FOG 6CC (MISCELLANEOUS) ×2 IMPLANT
SUT MNCRL AB 4-0 PS2 18 (SUTURE) ×2 IMPLANT
TOWEL OR 17X26 10 PK STRL BLUE (TOWEL DISPOSABLE) ×2 IMPLANT
TOWEL OR NON WOVEN STRL DISP B (DISPOSABLE) ×2 IMPLANT
TRAY LAP CHOLE (CUSTOM PROCEDURE TRAY) ×2 IMPLANT
TROCAR BLADELESS OPT 5 75 (ENDOMECHANICALS) ×2 IMPLANT
TROCAR SLEEVE XCEL 5X75 (ENDOMECHANICALS) ×2 IMPLANT
TROCAR XCEL BLUNT TIP 100MML (ENDOMECHANICALS) ×2 IMPLANT
TROCAR XCEL NON-BLD 11X100MML (ENDOMECHANICALS) ×2 IMPLANT
TUBING INSUFFLATION 10FT LAP (TUBING) ×2 IMPLANT

## 2013-09-06 NOTE — Anesthesia Postprocedure Evaluation (Signed)
  Anesthesia Post-op Note  Patient: Lynn Martinez  Procedure(s) Performed: Procedure(s) (LRB): LAPAROSCOPIC CHOLECYSTECTOMY WITH INTRAOPERATIVE CHOLANGIOGRAM (N/A)  Patient Location: PACU  Anesthesia Type: General  Level of Consciousness: awake and alert   Airway and Oxygen Therapy: Patient Spontanous Breathing  Post-op Pain: mild  Post-op Assessment: Post-op Vital signs reviewed, Patient's Cardiovascular Status Stable, Respiratory Function Stable, Patent Airway and No signs of Nausea or vomiting  Last Vitals:  Filed Vitals:   09/06/13 1629  BP:   Pulse:   Temp: 36.7 C  Resp:     Post-op Vital Signs: stable   Complications: No apparent anesthesia complications

## 2013-09-06 NOTE — Progress Notes (Signed)
Subjective: She is feeling better.  She is still tender RUQ.  i have told her we have several in front to her so we are working thru the list and will try to do today.  Objective: Vital signs in last 24 hours: Temp:  [97.8 F (36.6 C)-98.4 F (36.9 C)] 98.3 F (36.8 C) (03/27 0601) Pulse Rate:  [56-62] 56 (03/27 0601) Resp:  [13-20] 20 (03/27 0601) BP: (105-124)/(58-88) 111/62 mmHg (03/27 0601) SpO2:  [99 %-100 %] 100 % (03/27 0601) Weight:  [108.863 kg (240 lb)-124.3 kg (274 lb 0.5 oz)] 124.3 kg (274 lb 0.5 oz) (03/26 2032) Last BM Date: 09/04/13 Afebrile, VSS Labs OK and lft's are better US shows gallstones and +Murphy's sign Intake/Output from previous day: 03/26 0701 - 03/27 0700 In: 240 [P.O.:240] Out: -  Intake/Output this shift:    General appearance: alert, cooperative and no distress GI: soft sore in RUQ, not distended.  Lab Results:   Recent Labs  09/05/13 1544 09/06/13 0418  WBC 7.6 7.0  HGB 12.1 11.0*  HCT 36.5 33.2*  PLT 395 374    BMET  Recent Labs  09/05/13 1544 09/06/13 0418  NA 137 138  K 4.4 4.0  CL 100 101  CO2 26 26  GLUCOSE 102* 130*  BUN 10 7  CREATININE 0.71 0.82  CALCIUM 9.7 8.9   PT/INR No results found for this basename: LABPROT, INR,  in the last 72 hours   Recent Labs Lab 09/05/13 1544 09/06/13 0418  AST 244* 110*  ALT 246* 189*  ALKPHOS 129* 117  BILITOT 0.9 0.5  PROT 7.5 6.8  ALBUMIN 3.6 3.1*     Lipase     Component Value Date/Time   LIPASE 16 09/05/2013 1544     Studies/Results: Dg Chest 2 View  09/05/2013   CLINICAL DATA:  Chest pain  EXAM: CHEST  2 VIEW  COMPARISON:  05/22/2011  FINDINGS: The heart size and mediastinal contours are within normal limits. Both lungs are clear. The visualized skeletal structures are unremarkable.  IMPRESSION: No active cardiopulmonary disease.   Electronically Signed   By: Daryll Brod M.D.   On: 09/05/2013 15:29   US Abdomen Limited Ruq  09/05/2013   CLINICAL DATA:   Right upper quadrant pain.  EXAM: US ABDOMEN LIMITED - RIGHT UPPER QUADRANT  COMPARISON:  None.  FINDINGS: Gallbladder:  Multiple gallstones are present. Gallbladder wall is 3 mm in thickness. No pericholecystic fluid identified. There is a positive sonographic Murphy sign.  Common bile duct:  Diameter: 5 mm  Liver:  Liver is mildly heterogeneous and echogenic without focal lesion.  IMPRESSION: Gallstones and positive sonographic Murphy sign. Findings are consistent with acute cholecystitis.   Electronically Signed   By: Shon Hale M.D.   On: 09/05/2013 17:05    Medications: . ciprofloxacin  400 mg Intravenous Q12H   Prior to Admission medications   Medication Sig Start Date End Date Taking? Authorizing Provider  cholecalciferol (VITAMIN D) 1000 UNITS tablet Take 1,000 Units by mouth daily. 03/14/12  Yes Darrol Jump, MD  docusate sodium (COLACE) 100 MG capsule Take 2 capsules (200 mg total) by mouth every morning. For stool softner 03/14/12  Yes Darrol Jump, MD  ferrous sulfate 325 (65 FE) MG tablet Take 1 tablet (325 mg total) by mouth daily with breakfast. For low iron 03/14/12  Yes Darrol Jump, MD  Fiber CHEW Chew 2 tablets by mouth daily.    Yes Historical Provider, MD  levothyroxine (  SYNTHROID, LEVOTHROID) 100 MCG tablet Take 100 mcg by mouth daily before breakfast.   Yes Historical Provider, MD  lithium carbonate 300 MG capsule Take 300 mg by mouth 2 (two) times daily.   Yes Historical Provider, MD  lurasidone (LATUDA) 80 MG TABS tablet Take 80 mg by mouth at bedtime.   Yes Historical Provider, MD  meclizine (ANTIVERT) 25 MG tablet Take 25 mg by mouth daily.   Yes Historical Provider, MD  prazosin (MINIPRESS) 1 MG capsule Take 1 capsule (1 mg total) by mouth at bedtime and may repeat dose one time if needed. For sleep/nightmares 03/14/12  Yes Darrol Jump, MD  propranolol (INDERAL) 20 MG tablet Take 20-40 mg by mouth 2 (two) times daily. Take 40 mg every morning. Take 20 mg every  evening.   Yes Historical Provider, MD  cyanocobalamin (,VITAMIN B-12,) 1000 MCG/ML injection Inject 1 mL (1,000 mcg total) into the muscle every 30 (thirty) days. For supplementation 03/14/12 03/14/13  Darrol Jump, MD     Assessment/Plan Subacute cholecystitis, cholelithiasis Hx of depression/OCD/ PTSD/Personality disorder Hypertension Poly cystic ovarian syndrome Hypothyroid, with hx of thyroid cancer Memory loss Vertigo   Plan:  Keep NPO and aim for surgery later today, better on antibiotics.   LOS: 1 day    Mendi Constable 09/06/2013

## 2013-09-06 NOTE — Anesthesia Preprocedure Evaluation (Signed)
Anesthesia Evaluation  Patient identified by MRN, date of birth, ID band Patient awake    Reviewed: Allergy & Precautions, H&P , NPO status , Patient's Chart, lab work & pertinent test results  Airway Mallampati: II TM Distance: <3 FB Neck ROM: Full    Dental no notable dental hx.    Pulmonary neg pulmonary ROS,  breath sounds clear to auscultation  Pulmonary exam normal       Cardiovascular hypertension, Pt. on medications Rhythm:Regular Rate:Normal     Neuro/Psych Bipolar Disorder negative neurological ROS     GI/Hepatic negative GI ROS, Neg liver ROS,   Endo/Other  Hyperthyroidism Morbid obesity  Renal/GU negative Renal ROS  negative genitourinary   Musculoskeletal negative musculoskeletal ROS (+)   Abdominal   Peds negative pediatric ROS (+)  Hematology  (+) anemia ,   Anesthesia Other Findings   Reproductive/Obstetrics negative OB ROS                           Anesthesia Physical Anesthesia Plan  ASA: III  Anesthesia Plan: General   Post-op Pain Management:    Induction: Intravenous  Airway Management Planned: Oral ETT  Additional Equipment:   Intra-op Plan:   Post-operative Plan: Extubation in OR  Informed Consent: I have reviewed the patients History and Physical, chart, labs and discussed the procedure including the risks, benefits and alternatives for the proposed anesthesia with the patient or authorized representative who has indicated his/her understanding and acceptance.   Dental advisory given  Plan Discussed with: CRNA and Surgeon  Anesthesia Plan Comments:         Anesthesia Quick Evaluation

## 2013-09-06 NOTE — Op Note (Signed)
09/05/2013 - 09/06/2013  4:14 PM  PATIENT:  Lynn Martinez  43 y.o. female  PRE-OPERATIVE DIAGNOSIS:  Cholecystitis with cholelithiasis  POST-OPERATIVE DIAGNOSIS:  Cholecystitis with cholelithiasis  PROCEDURE:  Procedure(s): LAPAROSCOPIC CHOLECYSTECTOMY WITH INTRAOPERATIVE CHOLANGIOGRAM (N/A)  SURGEON:  Surgeon(s) and Role:    * Merrie Roof, MD - Primary    * Shann Medal, MD - Assisting  PHYSICIAN ASSISTANT:   ASSISTANTS: Dr. Lucia Gaskins   ANESTHESIA:   general  EBL:  Total I/O In: 1000 [I.V.:1000] Out: 23 [Blood:50]  BLOOD ADMINISTERED:none  DRAINS: none   LOCAL MEDICATIONS USED:  MARCAINE     SPECIMEN:  Source of Specimen:  gallbladder  DISPOSITION OF SPECIMEN:  PATHOLOGY  COUNTS:  YES  TOURNIQUET:  * No tourniquets in log *  DICTATION: .Dragon Dictation  Procedure: After informed consent was obtained the patient was brought to the operating room and placed in the supine position on the operating room table. After adequate induction of general anesthesia the patient's abdomen was prepped with ChloraPrep allowed to dry and draped in usual sterile manner. The area below the umbilicus was infiltrated with quarter percent  Marcaine. A small incision was made with a 15 blade knife. The incision was carried down through the subcutaneous tissue bluntly with a hemostat and [employer] retractors. The linea alba was identified. The linea alba was incised with a 15 blade knife and each side was grasped with Coker clamps. The preperitoneal space was then probed with a hemostat until the peritoneum was opened and access was gained to the abdominal cavity. A 0 Vicryl pursestring stitch was placed in the fascia surrounding the opening. A Hassan cannula was then placed through the opening and anchored in place with the previously placed Vicryl purse string stitch. The abdomen was insufflated with carbon dioxide without difficulty. A laparoscope was inserted through the [facility] cannula  in the right upper quadrant was inspected. Next the epigastric region was infiltrated with % Marcaine. A small incision was made with a 15 blade knife. A 10 mm port was placed bluntly through this incision into the abdominal cavity under direct vision. Next 2 sites were chosen laterally on the right side of the abdomen for placement of 5 mm ports. Each of these areas was infiltrated with quarter percent Marcaine. Small stab incisions were made with a 15 blade knife. 5 mm ports were then placed bluntly through these incisions into the abdominal cavity under direct vision without difficulty. A blunt grasper was placed through the lateralmost 5 mm port and used to grasp the dome of the gallbladder and elevated anteriorly and superiorly. Another blunt grasper was placed through the other 5 mm port and used to retract the body and neck of the gallbladder. A dissector was placed through the epigastric port and using the electrocautery the peritoneal reflection at the gallbladder neck was opened. Blunt dissection was then carried out in this area until the gallbladder neck-cystic duct junction was readily identified and a good window was created. A single clip was placed on the gallbladder neck. A small  ductotomy was made just below the clip with laparoscopic scissors. A 14-gauge Angiocath was then placed through the anterior abdominal wall under direct vision. A Reddick cholangiogram catheter was then placed through the Angiocath and flushed. The catheter was then placed in the cystic duct and anchored in place with a clip. A cholangiogram was obtained that showed no filling defects good emptying into the duodenum an adequate length on the cystic duct.  The anchoring clip and catheters were then removed from the patient. 3 clips were placed proximally on the cystic duct and the duct was divided between the 2 sets of clips. Posterior to this the cystic artery was identified and again dissected bluntly in a circumferential  manner until a good window  was created. 2 clips were placed proximally and one distally on the artery and the artery was divided between the 2 sets of clips. Next a laparoscopic hook cautery device was used to separate the gallbladder from the liver bed. Prior to completely detaching the gallbladder from the liver bed the liver bed was inspected and several small bleeding points were coagulated with the electrocautery until the area was completely hemostatic. The gallbladder was then detached the rest of it from the liver bed without difficulty. A laparoscopic bag was inserted through the epigastric port. The gallbladder was placed within the bag and the bag was sealed. A laparoscope was then moved to the epigastric port. The gallbladder grasper was placed through the [facility] cannula and used to grasp the opening of the bag. The bag with the gallbladder was then removed with the [facility] cannula through the infraumbilical port without difficulty. The fascial defect was then closed with the previously placed Vicryl pursestring stitch as well as with another figure-of-eight 0 Vicryl stitch. The liver bed was inspected again and found to be hemostatic. The abdomen was irrigated with copious amounts of saline until the effluent was clear. The ports were then removed under direct vision without difficulty and were found to be hemostatic. Of note there was a loop of colon adherent to the midline just inferior to our infraumbilical port site. There was also some bleeding from the site where the angiocath went through the abdominal wall but this had stopped by the end of the case. The gas was allowed to escape. The skin incisions were all closed with interrupted 4-0 Monocryl subcuticular stitches. Dermabond dressings were applied. The patient tolerated the procedure well. At the end of the case all needle sponge and instrument counts were correct. The patient was then awakened and taken to recovery in stable condition  PLAN  OF CARE: Admit to inpatient   PATIENT DISPOSITION:  PACU - hemodynamically stable.   Delay start of Pharmacological VTE agent (>24hrs) due to surgical blood loss or risk of bleeding: yes

## 2013-09-06 NOTE — Transfer of Care (Signed)
Immediate Anesthesia Transfer of Care Note  Patient: Lynn Martinez  Procedure(s) Performed: Procedure(s): LAPAROSCOPIC CHOLECYSTECTOMY WITH INTRAOPERATIVE CHOLANGIOGRAM (N/A)  Patient Location: PACU  Anesthesia Type:General  Level of Consciousness: awake, alert , oriented and patient cooperative  Airway & Oxygen Therapy: Patient Spontanous Breathing and Patient connected to face mask oxygen  Post-op Assessment: Report given to PACU RN, Post -op Vital signs reviewed and stable and Patient moving all extremities X 4  Post vital signs: stable  Complications: No apparent anesthesia complications

## 2013-09-07 MED ORDER — DEXAMETHASONE SODIUM PHOSPHATE 10 MG/ML IJ SOLN
INTRAMUSCULAR | Status: AC
  Filled 2013-09-07: qty 1

## 2013-09-07 MED ORDER — HYDROCODONE-ACETAMINOPHEN 5-325 MG PO TABS: 1.0000 | ORAL_TABLET | ORAL | Status: DC | PRN

## 2013-09-07 NOTE — Progress Notes (Signed)
Patient ID: Lynn Martinez, female   DOB: 20-Jan-1971, 43 y.o.   MRN: [id number]  doing well No complaints abd soft  Plan:  discharge

## 2013-09-07 NOTE — Progress Notes (Signed)
DC instructions given to patient and her mother.  Patient denies further questions or concerns.  Patient to f/u with PCP on MOnday and to call [facility] for f/u within 2 weeks.  Rx given for pain.  Pt verbalized understanding of diet, exercise, bathing, driving, lifting instructions.  Patient medicated prior to DC home.  Pt to DC home with mother.  Pt refused Flu vaccine.  No changes noted since AM assessment.  IV DC.  Pt tolerated a regular diet prior to DC.

## 2013-09-07 NOTE — Discharge Summary (Signed)
Physician Discharge Summary  Patient ID: Lynn Martinez MRN: [id number] DOB/AGE: 09/17/1970 43 y.o.  Admit date: 09/05/2013 Discharge date: 09/07/2013  Admission Diagnoses:  Discharge Diagnoses:  Principal Problem:   Cholelithiasis with acute cholecystitis   Discharged Condition: good  Hospital Course: uneventful post op recovery.  Discharged home POD#1  Consults: None  Significant Diagnostic Studies:   Treatments: surgery: lap chole with c-gram  Discharge Exam: Blood pressure 117/53, pulse 89, temperature 98.3 F (36.8 C), temperature source Oral, resp. rate 18, height 5\' 7"  (1.702 m), weight 274 lb 0.5 oz (124.3 kg), last menstrual period 09/20/2011, SpO2 100.00%. General appearance: alert, cooperative and no distress Resp: clear to auscultation bilaterally Cardio: regular rate and rhythm, S1, S2 normal, no murmur, click, rub or gallop Incision/Wound: abd soft, incisions clean  Disposition: 01-Home or Self Care     Medication List    TAKE these medications       cholecalciferol 1000 UNITS tablet  Commonly known as:  VITAMIN D  Take 1,000 Units by mouth daily.     docusate sodium 100 MG capsule  Commonly known as:  COLACE  Take 2 capsules (200 mg total) by mouth every morning. For stool softner     ferrous sulfate 325 (65 FE) MG tablet  Take 1 tablet (325 mg total) by mouth daily with breakfast. For low iron     Fiber Chew  Chew 2 tablets by mouth daily.     HYDROcodone-acetaminophen 5-325 MG per tablet  Commonly known as:  NORCO  Take 1-2 tablets by mouth every 4 (four) hours as needed.     levothyroxine 100 MCG tablet  Commonly known as:  SYNTHROID, LEVOTHROID  Take 100 mcg by mouth daily before breakfast.     lithium carbonate 300 MG capsule  Take 300 mg by mouth 2 (two) times daily.     lurasidone 80 MG Tabs tablet  Commonly known as:  LATUDA  Take 80 mg by mouth at bedtime.     meclizine 25 MG tablet  Commonly known as:  ANTIVERT  Take 25  mg by mouth daily.     prazosin 1 MG capsule  Commonly known as:  MINIPRESS  Take 1 capsule (1 mg total) by mouth at bedtime and may repeat dose one time if needed. For sleep/nightmares     propranolol 20 MG tablet  Commonly known as:  INDERAL  Take 20-40 mg by mouth 2 (two) times daily. Take 40 mg every morning. Take 20 mg every evening.      ASK your doctor about these medications       cyanocobalamin 1000 MCG/ML injection  Commonly known as:  (VITAMIN B-12)  Inject 1 mL (1,000 mcg total) into the muscle every 30 (thirty) days. For supplementation           Follow-up Information   Call Scarlette Calico, MD. (Let him know you had surgery and follow up for medical issues.)    Specialty:  Internal Medicine   Contact information:   [address] [place] [address] [phone]       Follow up with Merrie Roof, MD. Schedule an appointment as soon as possible for a visit in 2 weeks. (Make an appointment for 2-3 weeks for follow up.)    Specialty:  General Surgery   Contact information:   [address] [place] [address] [phone]       Follow up with Merrie Roof, MD. Call in  2 weeks.   Specialty:  General Surgery   Contact information:   [address] [place] [address] [phone]       Signed: Harl Bowie 09/07/2013, 9:22 AM

## 2013-09-07 NOTE — Discharge Instructions (Signed)
Laparoscopic Cholecystectomy, Care After Refer to this sheet in the next few weeks. These instructions provide you with information on caring for yourself after your procedure. Your health care provider may also give you more specific instructions. Your treatment has been planned according to current medical practices, but problems sometimes occur. Call your health care provider if you have any problems or questions after your procedure. WHAT TO EXPECT AFTER THE PROCEDURE After your procedure, it is typical to have the following:  Pain at your incision sites. You will be given pain medicines to control the pain.  Mild nausea or vomiting. This should improve after the first 24 hours.  Bloating and possibly shoulder pain from the gas used during the procedure. This will improve after the first 24 hours. HOME CARE INSTRUCTIONS   Change bandages (dressings) as directed by your health care provider.  Keep the wound dry and clean. You may wash the wound gently with soap and water. Gently blot or dab the area dry.  Do not take baths or use swimming pools or hot tubs for 2 weeks or until your health care provider approves.  Only take over-the-counter or prescription medicines as directed by your health care provider.  Continue your normal diet as directed by your health care provider.  Do not lift anything heavier than 10 pounds (4.5 kg) until your health care provider approves.  Do not play contact sports for 1 week or until your health care provider approves. SEEK MEDICAL CARE IF:   You have redness, swelling, or increasing pain in the wound.  You notice yellowish-white fluid (pus) coming from the wound.  You have drainage from the wound that lasts longer than 1 day.  You notice a bad smell coming from the wound or dressing.  Your surgical cuts (incisions) break open. SEEK IMMEDIATE MEDICAL CARE IF:   You develop a rash.  You have difficulty breathing.  You have chest pain.  You  have a fever.  You have increasing pain in the shoulders (shoulder strap areas).  You have dizzy episodes or faint while standing.  You have severe abdominal pain.  You feel sick to your stomach (nauseous) or throw up (vomit) and this lasts for more than 1 day. Document Released: 05/30/2005 Document Revised: 03/20/2013 Document Reviewed: 01/09/2013 [facility] Patient Information 2014 [place].  [facility] ______[facility] LAPAROSCOPIC SURGERY: POST OP INSTRUCTIONS Always review your discharge instruction sheet given to you by the facility where your surgery was performed. IF YOU HAVE DISABILITY OR FAMILY LEAVE FORMS, YOU MUST BRING THEM TO THE OFFICE FOR PROCESSING.   DO NOT GIVE THEM TO YOUR DOCTOR.  1. A prescription for pain medication may be given to you upon discharge.  Take your pain medication as prescribed, if needed.  If narcotic pain medicine is not needed, then you may take acetaminophen (Tylenol) or ibuprofen (Advil) as needed. 2. Take your usually prescribed medications unless otherwise directed. 3. If you need a refill on your pain medication, please contact your pharmacy.  They will contact our office to request authorization. Prescriptions will not be filled after 5pm or on week-ends. 4. You should follow a light diet the first few days after arrival home, such as soup and crackers, etc.  Be sure to include lots of fluids daily. 5. Most patients will experience some swelling and bruising in the area of the incisions.  Ice packs will help.  Swelling and bruising can take several days to resolve.  6. It is common  to experience some constipation if taking pain medication after surgery.  Increasing fluid intake and taking a stool softener (such as Colace) will usually help or prevent this problem from occurring.  A mild laxative (Milk of Magnesia or Miralax) should be taken according to package instructions if there are no bowel movements after 48  hours. 7. Unless discharge instructions indicate otherwise, you may remove your bandages 24-48 hours after surgery, and you may shower at that time.  You may have steri-strips (small skin tapes) in place directly over the incision.  These strips should be left on the skin for 7-10 days.  If your surgeon used skin glue on the incision, you may shower in 24 hours.  The glue will flake off over the next 2-3 weeks.  Any sutures or staples will be removed at the office during your follow-up visit. 8. ACTIVITIES:  You may resume regular (light) daily activities beginning the next day--such as daily self-care, walking, climbing stairs--gradually increasing activities as tolerated.  You may have sexual intercourse when it is comfortable.  Refrain from any heavy lifting or straining until approved by your doctor. a. You may drive when you are no longer taking prescription pain medication, you can comfortably wear a seatbelt, and you can safely maneuver your car and apply brakes. b. RETURN TO WORK:  __________________________________________________________ 9. You should see your doctor in the office for a follow-up appointment approximately 2-3 weeks after your surgery.  Make sure that you call for this appointment within a day or two after you arrive home to insure a convenient appointment time. 10. OTHER INSTRUCTIONS: ____ice pack also for pain. 11. No lifting more than 15 to 20 pounds for 2 weeks______________________________________________________________________________________________________________________ __________________________________________________________________________________________________________________________ WHEN TO CALL YOUR DOCTOR: 1. Fever over 101.0 2. Inability to urinate 3. Continued bleeding from incision. 4. Increased pain, redness, or drainage from the incision. 5. Increasing abdominal pain  The clinic staff is available to answer your questions during regular business  hours.  Please dont hesitate to call and ask to speak to one of the nurses for clinical concerns.  If you have a medical emergency, go to the nearest emergency room or call 911.  A surgeon from [facility] is always on call at the hospital. [address] [place], [place], [place]  [address] ? [address], [place], [place]   [address] [phone] ? [phone] ? FAX [phone] Web site: [email or web address]

## 2013-09-09 ENCOUNTER — Encounter (HOSPITAL_COMMUNITY): Payer: Self-pay | Admitting: General Surgery

## 2013-09-23 ENCOUNTER — Ambulatory Visit (INDEPENDENT_AMBULATORY_CARE_PROVIDER_SITE_OTHER): Payer: Self-pay | Admitting: General Surgery

## 2013-09-23 ENCOUNTER — Encounter (INDEPENDENT_AMBULATORY_CARE_PROVIDER_SITE_OTHER): Payer: Self-pay | Admitting: General Surgery

## 2013-09-23 VITALS — BP 118/78 | HR 81 | Temp 97.2°F | Resp 16 | Ht 66.0 in | Wt 268.8 lb

## 2013-09-23 DIAGNOSIS — K8000 Calculus of gallbladder with acute cholecystitis without obstruction: Secondary | ICD-10-CM

## 2013-09-23 NOTE — Progress Notes (Signed)
Subjective:     Patient ID: Lynn Martinez, female   DOB: 27-Mar-1971, 43 y.o.   MRN: [id number]  HPI The patient is a 43 year old black female who is about 3 weeks status post laparoscopic cholecystectomy. She tolerated the surgery well. She denies any abdominal pain. Her appetite is good no bowels are working normally.  Review of Systems     Objective:   Physical Exam On exam her abdomen is soft and nontender. Her incisions are all healing nicely with no sign of infection.    Assessment:     The patient is 3 weeks status post laparoscopic cholecystectomy     Plan:     At this point I would like her to refrain from any heavy lifting for about another 3 weeks. After that she may return to her normal activities without restriction. I will plan to see her back on a when necessary basis.

## 2013-09-23 NOTE — Patient Instructions (Signed)
May return to normal activites No heavy lifting for a couple more weeks

## 2014-04-14 ENCOUNTER — Encounter (INDEPENDENT_AMBULATORY_CARE_PROVIDER_SITE_OTHER): Payer: Self-pay | Admitting: General Surgery

## 2014-11-27 ENCOUNTER — Emergency Department (HOSPITAL_COMMUNITY)
Admission: EM | Admit: 2014-11-27 | Discharge: 2014-11-28 | Disposition: A | Discharge status: Home / Self Care | Payer: Self-pay | Attending: Emergency Medicine | Admitting: Emergency Medicine

## 2014-11-27 ENCOUNTER — Encounter (HOSPITAL_COMMUNITY): Payer: Self-pay | Admitting: *Deleted

## 2014-11-27 DIAGNOSIS — I10 Essential (primary) hypertension: Secondary | ICD-10-CM | POA: Insufficient documentation

## 2014-11-27 DIAGNOSIS — D649 Anemia, unspecified: Secondary | ICD-10-CM | POA: Insufficient documentation

## 2014-11-27 DIAGNOSIS — Z79899 Other long term (current) drug therapy: Secondary | ICD-10-CM | POA: Insufficient documentation

## 2014-11-27 DIAGNOSIS — E039 Hypothyroidism, unspecified: Secondary | ICD-10-CM | POA: Insufficient documentation

## 2014-11-27 DIAGNOSIS — F419 Anxiety disorder, unspecified: Secondary | ICD-10-CM | POA: Insufficient documentation

## 2014-11-27 DIAGNOSIS — N390 Urinary tract infection, site not specified: Secondary | ICD-10-CM | POA: Insufficient documentation

## 2014-11-27 DIAGNOSIS — Z85850 Personal history of malignant neoplasm of thyroid: Secondary | ICD-10-CM | POA: Insufficient documentation

## 2014-11-27 DIAGNOSIS — Z880 Allergy status to penicillin: Secondary | ICD-10-CM | POA: Insufficient documentation

## 2014-11-27 DIAGNOSIS — E119 Type 2 diabetes mellitus without complications: Secondary | ICD-10-CM | POA: Insufficient documentation

## 2014-11-27 DIAGNOSIS — M545 Low back pain, unspecified: Secondary | ICD-10-CM

## 2014-11-27 DIAGNOSIS — Z86018 Personal history of other benign neoplasm: Secondary | ICD-10-CM | POA: Insufficient documentation

## 2014-11-27 DIAGNOSIS — Z8742 Personal history of other diseases of the female genital tract: Secondary | ICD-10-CM | POA: Insufficient documentation

## 2014-11-27 DIAGNOSIS — R32 Unspecified urinary incontinence: Secondary | ICD-10-CM

## 2014-11-27 DIAGNOSIS — Z3202 Encounter for pregnancy test, result negative: Secondary | ICD-10-CM | POA: Insufficient documentation

## 2014-11-27 DIAGNOSIS — F319 Bipolar disorder, unspecified: Secondary | ICD-10-CM | POA: Insufficient documentation

## 2014-11-27 HISTORY — DX: Type 2 diabetes mellitus without complications: E11.9

## 2014-11-27 HISTORY — DX: Post-traumatic stress disorder, unspecified: F43.10

## 2014-11-27 LAB — CBG MONITORING, ED: Glucose-Capillary: 142 mg/dL — ABNORMAL HIGH (ref 65–99)

## 2014-11-27 LAB — URINALYSIS, ROUTINE W REFLEX MICROSCOPIC
BILIRUBIN URINE: NEGATIVE
Glucose, UA: NEGATIVE mg/dL
Hgb urine dipstick: NEGATIVE
Ketones, ur: NEGATIVE mg/dL
NITRITE: POSITIVE — AB
PH: 6 (ref 5.0–8.0)
Protein, ur: NEGATIVE mg/dL
SPECIFIC GRAVITY, URINE: 1.026 (ref 1.005–1.030)
UROBILINOGEN UA: 1 mg/dL (ref 0.0–1.0)

## 2014-11-27 LAB — PREGNANCY, URINE: PREG TEST UR: NEGATIVE

## 2014-11-27 LAB — COMPREHENSIVE METABOLIC PANEL
ALBUMIN: 3.6 g/dL (ref 3.5–5.0)
ALT: 15 U/L (ref 14–54)
AST: 15 U/L (ref 15–41)
Alkaline Phosphatase: 57 U/L (ref 38–126)
Anion gap: 9 (ref 5–15)
BUN: 13 mg/dL (ref 6–20)
CALCIUM: 9.2 mg/dL (ref 8.9–10.3)
CO2: 25 mmol/L (ref 22–32)
CREATININE: 0.8 mg/dL (ref 0.44–1.00)
Chloride: 104 mmol/L (ref 101–111)
GFR calc Af Amer: >60 mL/min (ref >60)
GFR calc non Af Amer: >60 mL/min (ref >60)
Glucose, Bld: 139 mg/dL — ABNORMAL HIGH (ref 65–99)
Potassium: 3.5 mmol/L (ref 3.5–5.1)
Sodium: 138 mmol/L (ref 135–145)
Total Bilirubin: 0.4 mg/dL (ref 0.3–1.2)
Total Protein: 7.3 g/dL (ref 6.5–8.1)

## 2014-11-27 LAB — CBC
HEMATOCRIT: 34.9 % — AB (ref 36.0–46.0)
Hemoglobin: 10.9 g/dL — ABNORMAL LOW (ref 12.0–15.0)
MCH: 26.4 pg (ref 26.0–34.0)
MCHC: 31.2 g/dL (ref 30.0–36.0)
MCV: 84.5 fL (ref 78.0–100.0)
PLATELETS: 409 10*3/uL — AB (ref 150–400)
RBC: 4.13 MIL/uL (ref 3.87–5.11)
RDW: 15 % (ref 11.5–15.5)
WBC: 10.9 10*3/uL — AB (ref 4.0–10.5)

## 2014-11-27 LAB — URINE MICROSCOPIC-ADD ON

## 2014-11-27 MED ORDER — ACETAMINOPHEN 325 MG PO TABS
650.0000 mg | ORAL_TABLET | Freq: Once | ORAL | Status: AC
  Administered 2014-11-28: 650 mg via ORAL
  Filled 2014-11-27: qty 2

## 2014-11-27 MED ORDER — NITROFURANTOIN MONOHYD MACRO 100 MG PO CAPS
100.0000 mg | ORAL_CAPSULE | Freq: Once | ORAL | Status: AC
  Administered 2014-11-28: 100 mg via ORAL
  Filled 2014-11-27: qty 1

## 2014-11-27 NOTE — ED Notes (Signed)
Pt c/o left flank pain x 7 days; pt states that she has tried to take baths without relief; pt also reports episodes of incontinence; pt c/o burning when urinates; pt c/o urinary frequency and urgency

## 2014-11-28 MED ORDER — NITROFURANTOIN MONOHYD MACRO 100 MG PO CAPS: 100.0000 mg | ORAL_CAPSULE | Freq: Two times a day (BID) | ORAL | Status: DC

## 2014-11-28 NOTE — Discharge Instructions (Signed)
Please use antibiotics as directed. Please use Tylenol as needed for back pain, heat, ice, rest, back exercises. Please follow-up with your primary care provider in 3 days for reevaluation of your symptoms including incontinence, back pain, urinary tract infection.

## 2014-11-28 NOTE — ED Provider Notes (Signed)
CSN: [id number]     Arrival date & time 11/27/14  2136 History   First MD Initiated Contact with Patient 11/27/14 2333     Chief Complaint  Patient presents with  . Flank Pain   HPI   44 year old female with a history of urinary incontinence presents today with back pain and urinary symptoms. Patient reports a long-standing history of urinary incontinence for which she's been treated with Detrol. Patient reports that previously before beginning therapy she had 4 diaper changes per day, down to one per day with therapy. She reports that over the past several weeks she has began to increase her need for diapers again up to 2 per day. Patient reports that she was seen by her primary care provider for this and is currently being managed. Patient reports that approximately 7 days ago she started developing left back and flank pain, has not tried any oral pain medication, has been using warm baths with no relief. Patient reports that she recently began during an exercise routine, believes this may be the culprit. She denies any radiation of pain, pain is worse with forward flexion and right lateral flexion, no saddle anesthesia, no loss of distal sensation strength or motor function of the distal extremities. No red flags. Patient also notes over the last 2 days she began to experience increased urinary frequency and urgency, and painful urinations. Patient reports she is sexually active, denies discharge, history of STDs. Patient denies fever, chills, nausea, vomiting, abdominal pain, lower extremity swelling or edema.     Past Medical History  Diagnosis Date  . Depression   . Hypertension   . Cancer 2005    THYROID  . Fibroid   . PMS (premenstrual syndrome)   . Hormone disorder   . PCOS (polycystic ovarian syndrome)   . Grave's disease   . 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 Anemia   . Seasonal allergies   . Hypothyroidism   . Headache(784.0)     history - otc  med prn  . Anxiety   . Vertigo   . Bipolar disorder   . Diabetes mellitus without complication   . PTSD (post-traumatic stress disorder)    Past Surgical History  Procedure Laterality Date  . Thyroidectomy    . Myomectomy  2010    LAPAROSCOPY  . Umbilical hernia repair  1982    as child  . Diagnostic laparoscopy    . Abdominal hysterectomy  10/11/2011    Procedure: HYSTERECTOMY ABDOMINAL;  Surgeon: Terrance Mass, MD;  Location: [facility];  Service: Gynecology;  Laterality: N/A;  With Repair of serosa.  . Cholecystectomy N/A 09/06/2013    Procedure: LAPAROSCOPIC CHOLECYSTECTOMY WITH INTRAOPERATIVE CHOLANGIOGRAM;  Surgeon: Merrie Roof, MD;  Location: [facility];  Service: General;  Laterality: N/A;   Family History  Problem Relation Age of Onset  . Thyroid disease Mother   . Diabetes Father   . Hypertension Father   . Heart disease Father   . Cancer Maternal Aunt     BRAIN  . Cancer Maternal Grandmother     LYMPHOMA  . Hypertension Maternal Grandmother   . Cancer Paternal Grandmother     PANCREATIC  . Diabetes Paternal Grandmother   . Hypertension Paternal Grandmother   . Hypertension Maternal Grandfather   . Hypertension Paternal Grandfather    History  Substance Use Topics  . Smoking status: Never Smoker   . Smokeless tobacco: Never Used  .  Alcohol Use: No   OB History    Gravida Para Term Preterm AB TAB SAB Ectopic Multiple Living   2 0   2  2   0     Review of Systems  All other systems reviewed and are negative.   Allergies  Aspirin; Codeine; Ibuprofen; Morphine; and Penicillins  Home Medications   Prior to Admission medications   Medication Sig Start Date End Date Taking? Authorizing Provider  cholecalciferol (VITAMIN D) 1000 UNITS tablet Take 1,000 Units by mouth daily. 03/14/12  Yes Darrol Jump, MD  Dextromethorphan-Guaifenesin (CORICIDIN HBP CONGESTION/COUGH) 10-200 MG CAPS Take 1 capsule by mouth daily.   Yes Historical Provider, MD  docusate  sodium (COLACE) 100 MG capsule Take 2 capsules (200 mg total) by mouth every morning. For stool softner 03/14/12  Yes Darrol Jump, MD  ferrous sulfate 325 (65 FE) MG tablet Take 1 tablet (325 mg total) by mouth daily with breakfast. For low iron 03/14/12  Yes Darrol Jump, MD  Fiber CHEW Chew 2 tablets by mouth daily.    Yes Historical Provider, MD  glipiZIDE (GLUCOTROL) 10 MG tablet Take 10 mg by mouth 2 (two) times daily before a meal.   Yes Historical Provider, MD  hydrochlorothiazide (MICROZIDE) 12.5 MG capsule Take 12.5 mg by mouth daily.   Yes Historical Provider, MD  levothyroxine (SYNTHROID, LEVOTHROID) 150 MCG tablet Take 150 mcg by mouth daily before breakfast.   Yes Historical Provider, MD  lithium carbonate 300 MG capsule Take 600 mg by mouth 2 (two) times daily.    Yes Historical Provider, MD  lurasidone (LATUDA) 80 MG TABS tablet Take 80 mg by mouth at bedtime.   Yes Historical Provider, MD  meclizine (ANTIVERT) 25 MG tablet Take 25 mg by mouth daily.   Yes Historical Provider, MD  prazosin (MINIPRESS) 1 MG capsule Take 1 capsule (1 mg total) by mouth at bedtime and may repeat dose one time if needed. For sleep/nightmares 03/14/12  Yes Darrol Jump, MD  propranolol (INDERAL) 20 MG tablet Take 20-40 mg by mouth 2 (two) times daily. Take 40 mg every morning. Take 20 mg every evening.   Yes Historical Provider, MD  tolterodine (DETROL) 2 MG tablet Take 4 mg by mouth daily.   Yes Historical Provider, MD  cyanocobalamin (,VITAMIN B-12,) 1000 MCG/ML injection Inject 1 mL (1,000 mcg total) into the muscle every 30 (thirty) days. For supplementation 03/14/12 03/14/13  Darrol Jump, MD  nitrofurantoin, macrocrystal-monohydrate, (MACROBID) 100 MG capsule Take 1 capsule (100 mg total) by mouth 2 (two) times daily. 11/28/14   Okey Regal, PA-C   Pulse 72  Temp(Src) 98.6 F (37 C) (Oral)  Resp 20  SpO2 99%  LMP 09/20/2011   Physical Exam  Constitutional: She is oriented to person,  place, and time. She appears well-developed and well-nourished.  HENT:  Head: Normocephalic and atraumatic.  Eyes: Pupils are equal, round, and reactive to light.  Neck: Normal range of motion. Neck supple. No JVD present. No tracheal deviation present. No thyromegaly present.  Cardiovascular: Normal rate, regular rhythm, normal heart sounds and intact distal pulses.  Exam reveals no gallop and no friction rub.   No murmur heard. Pulmonary/Chest: Effort normal and breath sounds normal. No stridor. No respiratory distress. She has no wheezes. She has no rales. She exhibits no tenderness.  Abdominal: Soft. Bowel sounds are normal. There is no tenderness. There is no CVA tenderness.  Musculoskeletal: Normal range of motion.  Patient minimally  tender to palpation of left lower lumbar region, no signs of trauma, no obvious deformities. Patient has pain with forward flexion of the hips, right lateral flexion. No point tenderness over the spine. Patient has 5 out of 5 strength in the distal extremities, sensation intact throughout.  Lymphadenopathy:    She has no cervical adenopathy.  Neurological: She is alert and oriented to person, place, and time. Coordination normal.  Skin: Skin is warm and dry.  Psychiatric: She has a normal mood and affect. Her behavior is normal. Judgment and thought content normal.  Nursing note and vitals reviewed.   ED Course  Procedures (including critical care time) Labs Review Labs Reviewed  URINALYSIS, ROUTINE W REFLEX MICROSCOPIC (NOT AT [facility]) - Abnormal; Notable for the following:    APPearance CLOUDY (*)    Nitrite POSITIVE (*)    Leukocytes, UA MODERATE (*)    All other components within normal limits  CBC - Abnormal; Notable for the following:    WBC 10.9 (*)    Hemoglobin 10.9 (*)    HCT 34.9 (*)    Platelets 409 (*)    All other components within normal limits  COMPREHENSIVE METABOLIC PANEL - Abnormal; Notable for the following:    Glucose, Bld 139  (*)    All other components within normal limits  URINE MICROSCOPIC-ADD ON - Abnormal; Notable for the following:    Bacteria, UA MANY (*)    All other components within normal limits  CBG MONITORING, ED - Abnormal; Notable for the following:    Glucose-Capillary 142 (*)    All other components within normal limits  PREGNANCY, URINE    Imaging Review No results found.   EKG Interpretation None      MDM   Final diagnoses:  Left-sided low back pain without sciatica  UTI (lower urinary tract infection)  Urinary incontinence, unspecified incontinence type    Labs: CBC, CMP, urinalysis, urine break, CBG- WBC 10.9, hemoglobin 10.9 moderate leukocytes nitrite positive  Imaging: None indicated  Consults: None  Therapeutics: Tylenol, Macrobid  Assessment: Back pain, urinary tract infection, urinary incontinence  Plan: Patient's urinary continence is chronic, closely followed by her primary care provider. Patient has urinary tract infection, no signs of pyelonephritis or any other significant findings, she will be treated with Macrobid. Back pain point tender to palpation, likely represents muscular in nature due to recent increase in exercise. No red flags. Patient given strict return precautions encouraged follow-up with a primary care provider in 3 days for further evaluation and management. Patient verbalized understanding and agreement to today's plan and had no further questions or concerns.      Okey Regal, PA-C 11/28/14 1308  Linton Flemings, MD 11/28/14 [phone]

## 2014-12-07 ENCOUNTER — Emergency Department (HOSPITAL_COMMUNITY)
Admission: EM | Admit: 2014-12-07 | Discharge: 2014-12-07 | Disposition: A | Discharge status: Home / Self Care | Payer: Self-pay | Attending: Emergency Medicine | Admitting: Emergency Medicine

## 2014-12-07 ENCOUNTER — Encounter (HOSPITAL_COMMUNITY): Payer: Self-pay | Admitting: Emergency Medicine

## 2014-12-07 DIAGNOSIS — Z79899 Other long term (current) drug therapy: Secondary | ICD-10-CM | POA: Insufficient documentation

## 2014-12-07 DIAGNOSIS — E119 Type 2 diabetes mellitus without complications: Secondary | ICD-10-CM | POA: Insufficient documentation

## 2014-12-07 DIAGNOSIS — Z9049 Acquired absence of other specified parts of digestive tract: Secondary | ICD-10-CM | POA: Insufficient documentation

## 2014-12-07 DIAGNOSIS — F419 Anxiety disorder, unspecified: Secondary | ICD-10-CM | POA: Insufficient documentation

## 2014-12-07 DIAGNOSIS — I10 Essential (primary) hypertension: Secondary | ICD-10-CM | POA: Insufficient documentation

## 2014-12-07 DIAGNOSIS — Z8709 Personal history of other diseases of the respiratory system: Secondary | ICD-10-CM | POA: Insufficient documentation

## 2014-12-07 DIAGNOSIS — D649 Anemia, unspecified: Secondary | ICD-10-CM | POA: Insufficient documentation

## 2014-12-07 DIAGNOSIS — Z85850 Personal history of malignant neoplasm of thyroid: Secondary | ICD-10-CM | POA: Insufficient documentation

## 2014-12-07 DIAGNOSIS — Z8742 Personal history of other diseases of the female genital tract: Secondary | ICD-10-CM | POA: Insufficient documentation

## 2014-12-07 DIAGNOSIS — F319 Bipolar disorder, unspecified: Secondary | ICD-10-CM | POA: Insufficient documentation

## 2014-12-07 DIAGNOSIS — N12 Tubulo-interstitial nephritis, not specified as acute or chronic: Secondary | ICD-10-CM | POA: Insufficient documentation

## 2014-12-07 DIAGNOSIS — Z880 Allergy status to penicillin: Secondary | ICD-10-CM | POA: Insufficient documentation

## 2014-12-07 DIAGNOSIS — E890 Postprocedural hypothyroidism: Secondary | ICD-10-CM | POA: Insufficient documentation

## 2014-12-07 LAB — CBC WITH DIFFERENTIAL/PLATELET
BASOS PCT: 1 % (ref 0–1)
Basophils Absolute: 0.1 10*3/uL (ref 0.0–0.1)
Eosinophils Absolute: 0.4 10*3/uL (ref 0.0–0.7)
Eosinophils Relative: 3 % (ref 0–5)
HEMATOCRIT: 36.1 % (ref 36.0–46.0)
HEMOGLOBIN: 11.3 g/dL — AB (ref 12.0–15.0)
Lymphocytes Relative: 32 % (ref 12–46)
Lymphs Abs: 3.4 10*3/uL (ref 0.7–4.0)
MCH: 26.6 pg (ref 26.0–34.0)
MCHC: 31.3 g/dL (ref 30.0–36.0)
MCV: 84.9 fL (ref 78.0–100.0)
MONOS PCT: 7 % (ref 3–12)
Monocytes Absolute: 0.7 10*3/uL (ref 0.1–1.0)
Neutro Abs: 6.2 10*3/uL (ref 1.7–7.7)
Neutrophils Relative %: 57 % (ref 43–77)
PLATELETS: 426 10*3/uL — AB (ref 150–400)
RBC: 4.25 MIL/uL (ref 3.87–5.11)
RDW: 15.4 % (ref 11.5–15.5)
WBC: 10.7 10*3/uL — AB (ref 4.0–10.5)

## 2014-12-07 LAB — COMPREHENSIVE METABOLIC PANEL
ALT: 14 U/L (ref 14–54)
AST: 14 U/L — ABNORMAL LOW (ref 15–41)
Albumin: 3.5 g/dL (ref 3.5–5.0)
Alkaline Phosphatase: 51 U/L (ref 38–126)
Anion gap: 7 (ref 5–15)
BUN: 9 mg/dL (ref 6–20)
CALCIUM: 9.3 mg/dL (ref 8.9–10.3)
CO2: 28 mmol/L (ref 22–32)
CREATININE: 0.87 mg/dL (ref 0.44–1.00)
Chloride: 102 mmol/L (ref 101–111)
GFR calc Af Amer: >60 mL/min (ref >60)
GFR calc non Af Amer: >60 mL/min (ref >60)
GLUCOSE: 132 mg/dL — AB (ref 65–99)
POTASSIUM: 3.9 mmol/L (ref 3.5–5.1)
SODIUM: 137 mmol/L (ref 135–145)
Total Bilirubin: 0.3 mg/dL (ref 0.3–1.2)
Total Protein: 7.5 g/dL (ref 6.5–8.1)

## 2014-12-07 LAB — URINALYSIS, ROUTINE W REFLEX MICROSCOPIC
BILIRUBIN URINE: NEGATIVE
Glucose, UA: NEGATIVE mg/dL
Hgb urine dipstick: NEGATIVE
Ketones, ur: NEGATIVE mg/dL
NITRITE: NEGATIVE
Protein, ur: NEGATIVE mg/dL
Specific Gravity, Urine: 1.018 (ref 1.005–1.030)
Urobilinogen, UA: 0.2 mg/dL (ref 0.0–1.0)
pH: 6.5 (ref 5.0–8.0)

## 2014-12-07 LAB — URINE MICROSCOPIC-ADD ON

## 2014-12-07 LAB — LIPASE, BLOOD: Lipase: 15 U/L — ABNORMAL LOW (ref 22–51)

## 2014-12-07 MED ORDER — CIPROFLOXACIN HCL 500 MG PO TABS: 500.0000 mg | ORAL_TABLET | Freq: Two times a day (BID) | ORAL | Status: DC

## 2014-12-07 MED ORDER — METHOCARBAMOL 1000 MG/10ML IJ SOLN: 1000.0000 mg | Freq: Once | INTRAMUSCULAR | Status: DC

## 2014-12-07 MED ORDER — METHOCARBAMOL 1000 MG/10ML IJ SOLN
1000.0000 mg | Freq: Once | INTRAVENOUS | Status: AC
  Administered 2014-12-07: 1000 mg via INTRAVENOUS
  Filled 2014-12-07: qty 10

## 2014-12-07 MED ORDER — CIPROFLOXACIN HCL 500 MG PO TABS
500.0000 mg | ORAL_TABLET | Freq: Once | ORAL | Status: AC
  Administered 2014-12-07: 500 mg via ORAL
  Filled 2014-12-07: qty 1

## 2014-12-07 NOTE — Discharge Instructions (Signed)
Pyelonephritis, Adult °Pyelonephritis is a kidney infection. In general, there are 2 main types of pyelonephritis: °· Infections that come on quickly without any warning (acute pyelonephritis). °· Infections that persist for a long period of time (chronic pyelonephritis). °CAUSES  °Two main causes of pyelonephritis are: °· Bacteria traveling from the bladder to the kidney. This is a problem especially in pregnant women. The urine in the bladder can become filled with bacteria from multiple causes, including: °¨ Inflammation of the prostate gland (prostatitis). °¨ Sexual intercourse in females. °¨ Bladder infection (cystitis). °· Bacteria traveling from the bloodstream to the tissue part of the kidney. °Problems that may increase your risk of getting a kidney infection include: °· Diabetes. °· Kidney stones or bladder stones. °· Cancer. °· Catheters placed in the bladder. °· Other abnormalities of the kidney or ureter. °SYMPTOMS  °· Abdominal pain. °· Pain in the side or flank area. °· Fever. °· Chills. °· Upset stomach. °· Blood in the urine (dark urine). °· Frequent urination. °· Strong or persistent urge to urinate. °· Burning or stinging when urinating. °DIAGNOSIS  °Your caregiver may diagnose your kidney infection based on your symptoms. A urine sample may also be taken. °TREATMENT  °In general, treatment depends on how severe the infection is.  °· If the infection is mild and caught early, your caregiver may treat you with oral antibiotics and send you home. °· If the infection is more severe, the bacteria may have gotten into the bloodstream. This will require intravenous (IV) antibiotics and a hospital stay. Symptoms may include: °¨ High fever. °¨ Severe flank pain. °¨ Shaking chills. °· Even after a hospital stay, your caregiver may require you to be on oral antibiotics for a period of time. °· Other treatments may be required depending upon the cause of the infection. °HOME CARE INSTRUCTIONS  °· Take your  antibiotics as directed. Finish them even if you start to feel better. °· Make an appointment to have your urine checked to make sure the infection is gone. °· Drink enough fluids to keep your urine clear or pale yellow. °· Take medicines for the bladder if you have urgency and frequency of urination as directed by your caregiver. °SEEK IMMEDIATE MEDICAL CARE IF:  °· You have a fever or persistent symptoms for more than 2-3 days. °· You have a fever and your symptoms suddenly get worse. °· You are unable to take your antibiotics or fluids. °· You develop shaking chills. °· You experience extreme weakness or fainting. °· There is no improvement after 2 days of treatment. °MAKE SURE YOU: °· Understand these instructions. °· Will watch your condition. °· Will get help right away if you are not doing well or get worse. °Document Released: 05/30/2005 Document Revised: 11/29/2011 Document Reviewed: 11/03/2010 °[employer]® Patient Information ©2015 [employer]. This information is not intended to replace advice given to you by your health care provider. Make sure you discuss any questions you have with your health care provider. ° °

## 2014-12-07 NOTE — ED Notes (Signed)
Pt from home c/o left flank pain that radiates to the left front lower abdomen. Denies dysuria. She was seen on the 6/17 Dx with UTI. She reports taking the whole course of antibiotics but reports no change in condition.

## 2014-12-07 NOTE — ED Provider Notes (Signed)
CSN: [id number]     Arrival date & time 12/07/14  2841 History   First MD Initiated Contact with Patient 12/07/14 [phone]     Chief Complaint  Patient presents with  . Flank Pain     (Consider location/radiation/quality/duration/timing/severity/associated sxs/prior Treatment) Patient is a 44 y.o. female presenting with flank pain. The history is provided by the patient. No language interpreter was used.  Flank Pain This is a new problem. The current episode started 1 to 4 weeks ago. The problem occurs constantly. The problem has been gradually worsening. Associated symptoms include abdominal pain. Pertinent negatives include no chills, fever, nausea or vomiting. Associated symptoms comments: Left flank pain that started one week ago and now wraps around to the left abdomen and upper left back. It is worse with movement and better with rest. No fever. No nausea, vomiting or urinary symptoms. She denies changes in bowel movements. She was seen one week earlier in the emergency department and diagnosed with UTI. She reports taking the medications as directed without change in her pain. .    Past Medical History  Diagnosis Date  . Depression   . Hypertension   . Cancer 2005    THYROID  . Fibroid   . PMS (premenstrual syndrome)   . Hormone disorder   . PCOS (polycystic ovarian syndrome)   . Grave's disease   . 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 Anemia   . Seasonal allergies   . Hypothyroidism   . Headache(784.0)     history - otc med prn  . Anxiety   . Vertigo   . Bipolar disorder   . Diabetes mellitus without complication   . PTSD (post-traumatic stress disorder)    Past Surgical History  Procedure Laterality Date  . Thyroidectomy    . Myomectomy  2010    LAPAROSCOPY  . Umbilical hernia repair  1982    as child  . Diagnostic laparoscopy    . Abdominal hysterectomy  10/11/2011    Procedure: HYSTERECTOMY ABDOMINAL;  Surgeon: Terrance Mass, MD;   Location: [facility];  Service: Gynecology;  Laterality: N/A;  With Repair of serosa.  . Cholecystectomy N/A 09/06/2013    Procedure: LAPAROSCOPIC CHOLECYSTECTOMY WITH INTRAOPERATIVE CHOLANGIOGRAM;  Surgeon: Merrie Roof, MD;  Location: [facility];  Service: General;  Laterality: N/A;   Family History  Problem Relation Age of Onset  . Thyroid disease Mother   . Diabetes Father   . Hypertension Father   . Heart disease Father   . Cancer Maternal Aunt     BRAIN  . Cancer Maternal Grandmother     LYMPHOMA  . Hypertension Maternal Grandmother   . Cancer Paternal Grandmother     PANCREATIC  . Diabetes Paternal Grandmother   . Hypertension Paternal Grandmother   . Hypertension Maternal Grandfather   . Hypertension Paternal Grandfather    History  Substance Use Topics  . Smoking status: Never Smoker   . Smokeless tobacco: Never Used  . Alcohol Use: No   OB History    Gravida Para Term Preterm AB TAB SAB Ectopic Multiple Living   2 0   2  2   0     Review of Systems  Constitutional: Negative for fever and chills.  Respiratory: Negative.  Negative for shortness of breath.   Cardiovascular: Negative.   Gastrointestinal: Positive for abdominal pain. Negative for nausea and vomiting.  Genitourinary: Positive for flank pain. Negative  for dysuria.  Musculoskeletal: Negative.   Skin: Negative.   Neurological: Negative.       Allergies  Aspirin; Codeine; Ibuprofen; Morphine; and Penicillins  Home Medications   Prior to Admission medications   Medication Sig Start Date End Date Taking? Authorizing Provider  cholecalciferol (VITAMIN D) 1000 UNITS tablet Take 1,000 Units by mouth daily. 03/14/12   Darrol Jump, MD  cyanocobalamin (,VITAMIN B-12,) 1000 MCG/ML injection Inject 1 mL (1,000 mcg total) into the muscle every 30 (thirty) days. For supplementation 03/14/12 03/14/13  Darrol Jump, MD  Dextromethorphan-Guaifenesin (CORICIDIN HBP CONGESTION/COUGH) 10-200 MG CAPS Take 1 capsule by  mouth daily.    Historical Provider, MD  docusate sodium (COLACE) 100 MG capsule Take 2 capsules (200 mg total) by mouth every morning. For stool softner 03/14/12   Darrol Jump, MD  ferrous sulfate 325 (65 FE) MG tablet Take 1 tablet (325 mg total) by mouth daily with breakfast. For low iron 03/14/12   Darrol Jump, MD  Fiber CHEW Chew 2 tablets by mouth daily.     Historical Provider, MD  glipiZIDE (GLUCOTROL) 10 MG tablet Take 10 mg by mouth 2 (two) times daily before a meal.    Historical Provider, MD  hydrochlorothiazide (MICROZIDE) 12.5 MG capsule Take 12.5 mg by mouth daily.    Historical Provider, MD  levothyroxine (SYNTHROID, LEVOTHROID) 150 MCG tablet Take 150 mcg by mouth daily before breakfast.    Historical Provider, MD  lithium carbonate 300 MG capsule Take 600 mg by mouth 2 (two) times daily.     Historical Provider, MD  lurasidone (LATUDA) 80 MG TABS tablet Take 80 mg by mouth at bedtime.    Historical Provider, MD  meclizine (ANTIVERT) 25 MG tablet Take 25 mg by mouth daily.    Historical Provider, MD  nitrofurantoin, macrocrystal-monohydrate, (MACROBID) 100 MG capsule Take 1 capsule (100 mg total) by mouth 2 (two) times daily. 11/28/14   Okey Regal, PA-C  prazosin (MINIPRESS) 1 MG capsule Take 1 capsule (1 mg total) by mouth at bedtime and may repeat dose one time if needed. For sleep/nightmares 03/14/12   Darrol Jump, MD  propranolol (INDERAL) 20 MG tablet Take 20-40 mg by mouth 2 (two) times daily. Take 40 mg every morning. Take 20 mg every evening.    Historical Provider, MD  tolterodine (DETROL) 2 MG tablet Take 4 mg by mouth daily.    Historical Provider, MD   BP 122/70 mmHg  Pulse 69  Temp(Src) 98.2 F (36.8 C) (Oral)  Resp 20  Ht 5\' 7"  (1.702 m)  Wt 280 lb (127.007 kg)  BMI 43.84 kg/m2  SpO2 99%  LMP 09/20/2011 Physical Exam  Constitutional: She is oriented to person, place, and time. She appears well-developed and well-nourished.  HENT:  Head:  Normocephalic.  Neck: Normal range of motion. Neck supple.  Cardiovascular: Normal rate and regular rhythm.   Pulmonary/Chest: Effort normal and breath sounds normal.  Abdominal: Soft. Bowel sounds are normal. There is tenderness. There is no rebound and no guarding.  Diffuse tenderness, slightly more on left abdomen than right.   Musculoskeletal: Normal range of motion.  Left sided back tenderness from lumbar to thoracic back. No palpable spasm. No swelling.   Neurological: She is alert and oriented to person, place, and time.  Skin: Skin is warm and dry. No rash noted.  Psychiatric: She has a normal mood and affect.    ED Course  Procedures (including critical care time) Labs Review  Labs Reviewed - No data to display  Imaging Review No results found.   EKG Interpretation None     Results for orders placed or performed during the hospital encounter of 12/07/14  Urine culture  Result Value Ref Range   Specimen Description URINE, RANDOM    Special Requests NONE    Culture      MULTIPLE SPECIES PRESENT, SUGGEST RECOLLECTION IF CLINICALLY INDICATED Performed at [facility]    Report Status 12/09/2014 FINAL   CBC with Differential  Result Value Ref Range   WBC 10.7 (H) 4.0 - 10.5 K/uL   RBC 4.25 3.87 - 5.11 MIL/uL   Hemoglobin 11.3 (L) 12.0 - 15.0 g/dL   HCT 36.1 36.0 - 46.0 %   MCV 84.9 78.0 - 100.0 fL   MCH 26.6 26.0 - 34.0 pg   MCHC 31.3 30.0 - 36.0 g/dL   RDW 15.4 11.5 - 15.5 %   Platelets 426 (H) 150 - 400 K/uL   Neutrophils Relative % 57 43 - 77 %   Neutro Abs 6.2 1.7 - 7.7 K/uL   Lymphocytes Relative 32 12 - 46 %   Lymphs Abs 3.4 0.7 - 4.0 K/uL   Monocytes Relative 7 3 - 12 %   Monocytes Absolute 0.7 0.1 - 1.0 K/uL   Eosinophils Relative 3 0 - 5 %   Eosinophils Absolute 0.4 0.0 - 0.7 K/uL   Basophils Relative 1 0 - 1 %   Basophils Absolute 0.1 0.0 - 0.1 K/uL  Comprehensive metabolic panel  Result Value Ref Range   Sodium 137 135 - 145 mmol/L    Potassium 3.9 3.5 - 5.1 mmol/L   Chloride 102 101 - 111 mmol/L   CO2 28 22 - 32 mmol/L   Glucose, Bld 132 (H) 65 - 99 mg/dL   BUN 9 6 - 20 mg/dL   Creatinine, Ser 0.87 0.44 - 1.00 mg/dL   Calcium 9.3 8.9 - 10.3 mg/dL   Total Protein 7.5 6.5 - 8.1 g/dL   Albumin 3.5 3.5 - 5.0 g/dL   AST 14 (L) 15 - 41 U/L   ALT 14 14 - 54 U/L   Alkaline Phosphatase 51 38 - 126 U/L   Total Bilirubin 0.3 0.3 - 1.2 mg/dL   GFR calc non Af Amer >60 >60 mL/min   GFR calc Af Amer >60 >60 mL/min   Anion gap 7 5 - 15  Lipase, blood  Result Value Ref Range   Lipase 15 (L) 22 - 51 U/L  Urinalysis, Routine w reflex microscopic (not at [facility])  Result Value Ref Range   Color, Urine YELLOW YELLOW   APPearance CLOUDY (A) CLEAR   Specific Gravity, Urine 1.018 1.005 - 1.030   pH 6.5 5.0 - 8.0   Glucose, UA NEGATIVE NEGATIVE mg/dL   Hgb urine dipstick NEGATIVE NEGATIVE   Bilirubin Urine NEGATIVE NEGATIVE   Ketones, ur NEGATIVE NEGATIVE mg/dL   Protein, ur NEGATIVE NEGATIVE mg/dL   Urobilinogen, UA 0.2 0.0 - 1.0 mg/dL   Nitrite NEGATIVE NEGATIVE   Leukocytes, UA MODERATE (A) NEGATIVE  Urine microscopic-add on  Result Value Ref Range   Squamous Epithelial / LPF RARE RARE   WBC, UA 11-20 <3 WBC/hpf   Bacteria, UA FEW (A) RARE   No results found.  MDM   Final diagnoses:  None    1. Flank pain  Patient care transferred to Will Dansy, PA-C, labs pending, for re-evaluation and disposition.    Charlann Lange, PA-C 12/10/14  2148 

## 2014-12-07 NOTE — ED Provider Notes (Signed)
Medical screening examination/treatment/procedure(s) were performed by non-physician practitioner and as supervising physician I was immediately available for consultation/collaboration.  Nursing notes and vitals signs, including pulse oximetry, reviewed.  Summary of this visit's results, reviewed by myself:  Labs:  Results for orders placed or performed during the hospital encounter of 12/07/14 (from the past 24 hour(s))  CBC with Differential     Status: Abnormal   Collection Time: 12/07/14  4:31 AM  Result Value Ref Range   WBC 10.7 (H) 4.0 - 10.5 K/uL   RBC 4.25 3.87 - 5.11 MIL/uL   Hemoglobin 11.3 (L) 12.0 - 15.0 g/dL   HCT 36.1 36.0 - 46.0 %   MCV 84.9 78.0 - 100.0 fL   MCH 26.6 26.0 - 34.0 pg   MCHC 31.3 30.0 - 36.0 g/dL   RDW 15.4 11.5 - 15.5 %   Platelets 426 (H) 150 - 400 K/uL   Neutrophils Relative % 57 43 - 77 %   Neutro Abs 6.2 1.7 - 7.7 K/uL   Lymphocytes Relative 32 12 - 46 %   Lymphs Abs 3.4 0.7 - 4.0 K/uL   Monocytes Relative 7 3 - 12 %   Monocytes Absolute 0.7 0.1 - 1.0 K/uL   Eosinophils Relative 3 0 - 5 %   Eosinophils Absolute 0.4 0.0 - 0.7 K/uL   Basophils Relative 1 0 - 1 %   Basophils Absolute 0.1 0.0 - 0.1 K/uL  Comprehensive metabolic panel     Status: Abnormal   Collection Time: 12/07/14  4:31 AM  Result Value Ref Range   Sodium 137 135 - 145 mmol/L   Potassium 3.9 3.5 - 5.1 mmol/L   Chloride 102 101 - 111 mmol/L   CO2 28 22 - 32 mmol/L   Glucose, Bld 132 (H) 65 - 99 mg/dL   BUN 9 6 - 20 mg/dL   Creatinine, Ser 0.87 0.44 - 1.00 mg/dL   Calcium 9.3 8.9 - 10.3 mg/dL   Total Protein 7.5 6.5 - 8.1 g/dL   Albumin 3.5 3.5 - 5.0 g/dL   AST 14 (L) 15 - 41 U/L   ALT 14 14 - 54 U/L   Alkaline Phosphatase 51 38 - 126 U/L   Total Bilirubin 0.3 0.3 - 1.2 mg/dL   GFR calc non Af Amer >60 >60 mL/min   GFR calc Af Amer >60 >60 mL/min   Anion gap 7 5 - 15  Lipase, blood     Status: Abnormal   Collection Time: 12/07/14  4:31 AM  Result Value Ref Range   Lipase 15 (L) 22 - 51 U/L  Urinalysis, Routine w reflex microscopic (not at [facility])     Status: Abnormal   Collection Time: 12/07/14  5:47 AM  Result Value Ref Range   Color, Urine YELLOW YELLOW   APPearance CLOUDY (A) CLEAR   Specific Gravity, Urine 1.018 1.005 - 1.030   pH 6.5 5.0 - 8.0   Glucose, UA NEGATIVE NEGATIVE mg/dL   Hgb urine dipstick NEGATIVE NEGATIVE   Bilirubin Urine NEGATIVE NEGATIVE   Ketones, ur NEGATIVE NEGATIVE mg/dL   Protein, ur NEGATIVE NEGATIVE mg/dL   Urobilinogen, UA 0.2 0.0 - 1.0 mg/dL   Nitrite NEGATIVE NEGATIVE   Leukocytes, UA MODERATE (A) NEGATIVE  Urine microscopic-add on     Status: Abnormal   Collection Time: 12/07/14  5:47 AM  Result Value Ref Range   Squamous Epithelial / LPF RARE RARE   WBC, UA 11-20 <3 WBC/hpf   Bacteria, UA FEW (  A) RARE    Labs consistent with poorly treated UTI. Location of pain in the flank suggest pyelonephritis. Will switch her to ciprofloxacin and send her urine for culture.      Shanon Rosser, MD 12/07/14 [phone]

## 2014-12-09 LAB — URINE CULTURE

## 2015-02-21 DIAGNOSIS — M79673 Pain in unspecified foot: Secondary | ICD-10-CM | POA: Insufficient documentation

## 2015-02-21 DIAGNOSIS — E1165 Type 2 diabetes mellitus with hyperglycemia: Secondary | ICD-10-CM | POA: Insufficient documentation

## 2015-02-21 DIAGNOSIS — I10 Essential (primary) hypertension: Secondary | ICD-10-CM | POA: Insufficient documentation

## 2015-02-22 ENCOUNTER — Encounter (HOSPITAL_COMMUNITY): Payer: Self-pay | Admitting: Emergency Medicine

## 2015-02-22 ENCOUNTER — Emergency Department (HOSPITAL_COMMUNITY)
Admission: EM | Admit: 2015-02-22 | Discharge: 2015-02-22 | Discharge status: Against Medical Advice | Payer: Self-pay | Attending: Emergency Medicine | Admitting: Emergency Medicine

## 2015-02-22 LAB — BASIC METABOLIC PANEL
Anion gap: 5 (ref 5–15)
BUN: 9 mg/dL (ref 6–20)
CO2: 27 mmol/L (ref 22–32)
CREATININE: 0.83 mg/dL (ref 0.44–1.00)
Calcium: 9.2 mg/dL (ref 8.9–10.3)
Chloride: 103 mmol/L (ref 101–111)
GFR calc Af Amer: >60 mL/min (ref >60)
GFR calc non Af Amer: >60 mL/min (ref >60)
Glucose, Bld: 175 mg/dL — ABNORMAL HIGH (ref 65–99)
Potassium: 3.5 mmol/L (ref 3.5–5.1)
SODIUM: 135 mmol/L (ref 135–145)

## 2015-02-22 LAB — CBC
HEMATOCRIT: 35.3 % — AB (ref 36.0–46.0)
Hemoglobin: 11.3 g/dL — ABNORMAL LOW (ref 12.0–15.0)
MCH: 26.7 pg (ref 26.0–34.0)
MCHC: 32 g/dL (ref 30.0–36.0)
MCV: 83.5 fL (ref 78.0–100.0)
PLATELETS: 410 10*3/uL — AB (ref 150–400)
RBC: 4.23 MIL/uL (ref 3.87–5.11)
RDW: 15.6 % — AB (ref 11.5–15.5)
WBC: 12.5 10*3/uL — ABNORMAL HIGH (ref 4.0–10.5)

## 2015-02-22 LAB — CBG MONITORING, ED: Glucose-Capillary: 187 mg/dL — ABNORMAL HIGH (ref 65–99)

## 2015-02-22 NOTE — ED Notes (Addendum)
Pt. Wanted to leave AMA, with a friend Mr. Carolee Rota at bedside. to notify MD.Charge Nurse made aware.

## 2015-02-22 NOTE — ED Notes (Addendum)
Pt from home c/o hyperglycemia, pins and needles in her feet, and left sided numbness since yesterday. Pt alert and oriented. Pt appears nervous about checking blood sugar. She reports tasking her medications as prescribed. She reports CBG has been in upper 300's

## 2015-02-22 NOTE — ED Notes (Signed)
CBG 187 in triage

## 2015-05-18 ENCOUNTER — Ambulatory Visit: Payer: Self-pay

## 2015-05-22 ENCOUNTER — Inpatient Hospital Stay (HOSPITAL_COMMUNITY): Payer: No Typology Code available for payment source

## 2015-05-22 ENCOUNTER — Encounter (HOSPITAL_COMMUNITY): Payer: Self-pay | Admitting: Student

## 2015-05-22 ENCOUNTER — Emergency Department (INDEPENDENT_AMBULATORY_CARE_PROVIDER_SITE_OTHER)
Admission: EM | Admit: 2015-05-22 | Discharge: 2015-05-22 | Disposition: A | Discharge status: Home / Self Care | Payer: No Typology Code available for payment source | Source: Home / Self Care | Attending: Family Medicine | Admitting: Family Medicine

## 2015-05-22 ENCOUNTER — Encounter (HOSPITAL_COMMUNITY): Payer: Self-pay | Admitting: Emergency Medicine

## 2015-05-22 ENCOUNTER — Inpatient Hospital Stay (HOSPITAL_COMMUNITY)
Admission: AD | Admit: 2015-05-22 | Discharge: 2015-05-22 | Disposition: A | Discharge status: Home / Self Care | Payer: Self-pay | Source: Ambulatory Visit | Attending: Obstetrics & Gynecology | Admitting: Obstetrics & Gynecology

## 2015-05-22 DIAGNOSIS — A499 Bacterial infection, unspecified: Secondary | ICD-10-CM

## 2015-05-22 DIAGNOSIS — F419 Anxiety disorder, unspecified: Secondary | ICD-10-CM | POA: Insufficient documentation

## 2015-05-22 DIAGNOSIS — N760 Acute vaginitis: Secondary | ICD-10-CM | POA: Insufficient documentation

## 2015-05-22 DIAGNOSIS — Z79899 Other long term (current) drug therapy: Secondary | ICD-10-CM | POA: Insufficient documentation

## 2015-05-22 DIAGNOSIS — B9689 Other specified bacterial agents as the cause of diseases classified elsewhere: Secondary | ICD-10-CM

## 2015-05-22 DIAGNOSIS — I10 Essential (primary) hypertension: Secondary | ICD-10-CM | POA: Insufficient documentation

## 2015-05-22 DIAGNOSIS — Z880 Allergy status to penicillin: Secondary | ICD-10-CM | POA: Insufficient documentation

## 2015-05-22 DIAGNOSIS — F319 Bipolar disorder, unspecified: Secondary | ICD-10-CM | POA: Insufficient documentation

## 2015-05-22 DIAGNOSIS — Z85850 Personal history of malignant neoplasm of thyroid: Secondary | ICD-10-CM | POA: Insufficient documentation

## 2015-05-22 DIAGNOSIS — R102 Pelvic and perineal pain: Secondary | ICD-10-CM

## 2015-05-22 DIAGNOSIS — E119 Type 2 diabetes mellitus without complications: Secondary | ICD-10-CM | POA: Insufficient documentation

## 2015-05-22 DIAGNOSIS — Z886 Allergy status to analgesic agent status: Secondary | ICD-10-CM | POA: Insufficient documentation

## 2015-05-22 DIAGNOSIS — F4310 Post-traumatic stress disorder, unspecified: Secondary | ICD-10-CM | POA: Insufficient documentation

## 2015-05-22 DIAGNOSIS — E039 Hypothyroidism, unspecified: Secondary | ICD-10-CM | POA: Insufficient documentation

## 2015-05-22 LAB — POCT URINALYSIS DIP (DEVICE)
Bilirubin Urine: NEGATIVE
GLUCOSE, UA: NEGATIVE mg/dL
Ketones, ur: NEGATIVE mg/dL
Nitrite: NEGATIVE
PROTEIN: NEGATIVE mg/dL
SPECIFIC GRAVITY, URINE: 1.02 (ref 1.005–1.030)
UROBILINOGEN UA: 0.2 mg/dL (ref 0.0–1.0)
pH: 7 (ref 5.0–8.0)

## 2015-05-22 LAB — URINALYSIS, ROUTINE W REFLEX MICROSCOPIC
BILIRUBIN URINE: NEGATIVE
Glucose, UA: NEGATIVE mg/dL
Hgb urine dipstick: NEGATIVE
KETONES UR: NEGATIVE mg/dL
Leukocytes, UA: NEGATIVE
NITRITE: NEGATIVE
PH: 6.5 (ref 5.0–8.0)
PROTEIN: NEGATIVE mg/dL
Specific Gravity, Urine: 1.015 (ref 1.005–1.030)

## 2015-05-22 LAB — WET PREP, GENITAL
Sperm: NONE SEEN
Trich, Wet Prep: NONE SEEN
Yeast Wet Prep HPF POC: NONE SEEN

## 2015-05-22 MED ORDER — METRONIDAZOLE 500 MG PO TABS: 500.0000 mg | ORAL_TABLET | Freq: Two times a day (BID) | ORAL | Status: DC

## 2015-05-22 MED ORDER — ACETAMINOPHEN 500 MG PO TABS
1000.0000 mg | ORAL_TABLET | ORAL | Status: AC
  Administered 2015-05-22: 1000 mg via ORAL
  Filled 2015-05-22: qty 2

## 2015-05-22 NOTE — Discharge Instructions (Signed)
[facility] problem[facility]

## 2015-05-22 NOTE — ED Provider Notes (Signed)
CSN: [id number]     Arrival date & time 05/22/15  1303 History   First MD Initiated Contact with Patient 05/22/15 1341     Chief Complaint  Patient presents with  . Abdominal Pain   (Consider location/radiation/quality/duration/timing/severity/associated sxs/prior Treatment) Patient is a 44 y.o. female presenting with abdominal pain. The history is provided by the patient and the spouse.  Abdominal Pain Pain location:  Suprapubic Pain quality: cramping, sharp and shooting   Pain radiates to:  Does not radiate Pain severity:  Mild Onset quality:  Gradual Duration:  1 week Progression:  Unchanged Chronicity:  New Context: previous surgery   Context comment:  S/p hyst for fibroids Relieved by:  None tried Worsened by:  Nothing tried Ineffective treatments:  None tried Associated symptoms: no diarrhea, no dysuria, no fever, no nausea and no vomiting   Risk factors: multiple surgeries and obesity     Past Medical History  Diagnosis Date  . Depression   . Hypertension   . Cancer ([place]) 2005    THYROID  . Fibroid   . PMS (premenstrual syndrome)   . Hormone disorder   . PCOS (polycystic ovarian syndrome)   . Grave's disease   . 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 Anemia   . Seasonal allergies   . Hypothyroidism   . Headache(784.0)     history - otc med prn  . Anxiety   . Vertigo   . Bipolar disorder ([place])   . Diabetes mellitus without complication ([place])   . PTSD (post-traumatic stress disorder)    Past Surgical History  Procedure Laterality Date  . Thyroidectomy    . Myomectomy  2010    LAPAROSCOPY  . Umbilical hernia repair  1982    as child  . Diagnostic laparoscopy    . Abdominal hysterectomy  10/11/2011    Procedure: HYSTERECTOMY ABDOMINAL;  Surgeon: Terrance Mass, MD;  Location: [facility];  Service: Gynecology;  Laterality: N/A;  With Repair of serosa.  . Cholecystectomy N/A 09/06/2013    Procedure: LAPAROSCOPIC CHOLECYSTECTOMY  WITH INTRAOPERATIVE CHOLANGIOGRAM;  Surgeon: Merrie Roof, MD;  Location: [facility];  Service: General;  Laterality: N/A;   Family History  Problem Relation Age of Onset  . Thyroid disease Mother   . Diabetes Father   . Hypertension Father   . Heart disease Father   . Cancer Maternal Aunt     BRAIN  . Cancer Maternal Grandmother     LYMPHOMA  . Hypertension Maternal Grandmother   . Cancer Paternal Grandmother     PANCREATIC  . Diabetes Paternal Grandmother   . Hypertension Paternal Grandmother   . Hypertension Maternal Grandfather   . Hypertension Paternal Grandfather    Social History  Substance Use Topics  . Smoking status: Never Smoker   . Smokeless tobacco: Never Used  . Alcohol Use: No   OB History    Gravida Para Term Preterm AB TAB SAB Ectopic Multiple Living   2 0   2  2   0     Review of Systems  Constitutional: Negative for fever.  Cardiovascular: Negative.   Gastrointestinal: Positive for abdominal pain. Negative for nausea, vomiting and diarrhea.  Genitourinary: Positive for pelvic pain. Negative for dysuria and urgency.  All other systems reviewed and are negative.   Allergies  Aspirin; Codeine; Ibuprofen; Metformin and related; Morphine; and Penicillins  Home Medications   Prior to Admission medications  Medication Sig Start Date End Date Taking? Authorizing Provider  cholecalciferol (VITAMIN D) 1000 UNITS tablet Take 2,000 Units by mouth daily.  03/14/12   Darrol Jump, MD  ciprofloxacin (CIPRO) 500 MG tablet Take 1 tablet (500 mg total) by mouth 2 (two) times daily. One po bid x 7 days Patient not taking: Reported on 05/22/2015 12/07/14   Shanon Rosser, MD  Dextromethorphan-Guaifenesin (CORICIDIN HBP CONGESTION/COUGH) 10-200 MG CAPS Take 1 capsule by mouth daily as needed (for cold symptoms).     Historical Provider, MD  docusate sodium (COLACE) 100 MG capsule Take 2 capsules (200 mg total) by mouth every morning. For stool softner 03/14/12   Darrol Jump, MD  ferrous sulfate 325 (65 FE) MG tablet Take 1 tablet (325 mg total) by mouth daily with breakfast. For low iron 03/14/12   Darrol Jump, MD  Fiber CHEW Chew 2 tablets by mouth daily.     Historical Provider, MD  glipiZIDE (GLUCOTROL) 10 MG tablet Take 10 mg by mouth 2 (two) times daily before a meal.    Historical Provider, MD  hydrochlorothiazide (MICROZIDE) 12.5 MG capsule Take 12.5 mg by mouth daily.    Historical Provider, MD  levothyroxine (SYNTHROID, LEVOTHROID) 150 MCG tablet Take 150 mcg by mouth daily before breakfast.    Historical Provider, MD  lithium carbonate 300 MG capsule Take 600 mg by mouth 2 (two) times daily.     Historical Provider, MD  lurasidone (LATUDA) 80 MG TABS tablet Take 80 mg by mouth at bedtime.    Historical Provider, MD  meclizine (ANTIVERT) 25 MG tablet Take 25 mg by mouth 2 (two) times daily as needed for dizziness.     Historical Provider, MD  prazosin (MINIPRESS) 1 MG capsule Take 1 capsule (1 mg total) by mouth at bedtime and may repeat dose one time if needed. For sleep/nightmares 03/14/12   Darrol Jump, MD  propranolol (INDERAL) 20 MG tablet Take 20 mg by mouth every evening. Take 20 mg every evening.    Historical Provider, MD  propranolol (INDERAL) 40 MG tablet Take 40 mg by mouth 3 (three) times daily.    Historical Provider, MD  sitaGLIPtin (JANUVIA) 100 MG tablet Take 100 mg by mouth daily.    Historical Provider, MD  tolterodine (DETROL) 2 MG tablet Take 4 mg by mouth daily.    Historical Provider, MD   Meds Ordered and Administered this Visit  Medications - No data to display  BP 135/97 mmHg  Pulse 67  Temp(Src) 98.2 F (36.8 C) (Oral)  Resp 16  SpO2 98%  LMP 09/20/2011 No data found.   Physical Exam  Constitutional: She is oriented to person, place, and time. She appears well-developed and well-nourished. No distress.  Cardiovascular: Normal heart sounds.   Pulmonary/Chest: Effort normal and breath sounds normal.    Abdominal: Soft. Bowel sounds are normal. She exhibits no distension and no mass. There is tenderness in the suprapubic area. There is no rigidity, no rebound, no guarding, no CVA tenderness, no tenderness at McBurney's point and negative Murphy's sign.    Neurological: She is alert and oriented to person, place, and time.  Skin: Skin is warm and dry.  Nursing note and vitals reviewed.   ED Course  Procedures (including critical care time)  Labs Review Labs Reviewed  POCT URINALYSIS DIP (DEVICE) - Abnormal; Notable for the following:    Hgb urine dipstick TRACE (*)    Leukocytes, UA SMALL (*)  All other components within normal limits  URINE CULTURE    Imaging Review No results found.   Visual Acuity Review  Right Eye Distance:   Left Eye Distance:   Bilateral Distance:    Right Eye Near:   Left Eye Near:    Bilateral Near:         MDM   1. Pelvic pain in female        Billy Fischer, MD 05/22/15 1420

## 2015-05-22 NOTE — Progress Notes (Signed)
Patient was seen at [facility] and sent here to be evaluated.

## 2015-05-22 NOTE — MAU Note (Signed)
Having pain where her ovaries are for about 1 week, bilateral lower abdominal pain radiates to back, denies NVD, has history of constipation, frequent urination which is normal for her, denies dysuria.

## 2015-05-22 NOTE — Discharge Instructions (Signed)

## 2015-05-22 NOTE — MAU Provider Note (Signed)
History     CSN: [id number]  Arrival date and time: 05/22/15 1535   First Provider Initiated Contact with Patient 05/22/15 1608      Chief Complaint  Patient presents with  . Pelvic Pain   HPI Lynn Martinez 44 y.o. [id number] nonpregnant female presents for eval of one week of pelvic pain.  She was seen by urgent care earlier today and sent here for further eval.  She reports pain initially on the right and now bilateral.  It radiates to the back.  It is presently 8/10 and occasionally improves to 5/10.  She uses 650mg  acetaminophen for pain relief.  No difficulty with eating/nausea/vomiting.  Denies fever, dysuria, constipation, diarrhea, weakness.   OB History    Gravida Para Term Preterm AB TAB SAB Ectopic Multiple Living   2 0   2  2   0      Past Medical History  Diagnosis Date  . Depression   . Hypertension   . Cancer ([place]) 2005    THYROID  . Fibroid     s/p myomectomy 2010, hysterectomy 2013  . PMS (premenstrual syndrome)   . Hormone disorder   . PCOS (polycystic ovarian syndrome)   . Grave's disease   . 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 Anemia   . Seasonal allergies   . Hypothyroidism   . Headache(784.0)     history - otc med prn  . Anxiety   . Vertigo   . Bipolar disorder ([place])   . Diabetes mellitus without complication ([place])   . PTSD (post-traumatic stress disorder)     Past Surgical History  Procedure Laterality Date  . Thyroidectomy    . Myomectomy  2010    LAPAROSCOPY  . Umbilical hernia repair  1982    as child  . Diagnostic laparoscopy    . Abdominal hysterectomy  10/11/2011    Procedure: HYSTERECTOMY ABDOMINAL;  Surgeon: Terrance Mass, MD;  Location: [facility];  Service: Gynecology;  Laterality: N/A;  With Repair of serosa.  . Cholecystectomy N/A 09/06/2013    Procedure: LAPAROSCOPIC CHOLECYSTECTOMY WITH INTRAOPERATIVE CHOLANGIOGRAM;  Surgeon: Merrie Roof, MD;  Location: [facility];  Service: General;   Laterality: N/A;    Family History  Problem Relation Age of Onset  . Thyroid disease Mother   . Diabetes Father   . Hypertension Father   . Heart disease Father   . Cancer Maternal Aunt     BRAIN  . Cancer Maternal Grandmother     LYMPHOMA  . Hypertension Maternal Grandmother   . Cancer Paternal Grandmother     PANCREATIC  . Diabetes Paternal Grandmother   . Hypertension Paternal Grandmother   . Hypertension Maternal Grandfather   . Hypertension Paternal Grandfather     Social History  Substance Use Topics  . Smoking status: Never Smoker   . Smokeless tobacco: Never Used  . Alcohol Use: No    Allergies:  Allergies  Allergen Reactions  . Aspirin Hives  . Codeine Itching    Tolerates Hydrocodone OK.  . Ibuprofen Other (See Comments)    Happened in childhood  . Metformin And Related Other (See Comments)    Muscle weakness  . Morphine Other (See Comments)    Throat swelling  . Penicillins Other (See Comments)    Childhood reaction    Prescriptions prior to admission  Medication Sig Dispense Refill Last Dose  . cholecalciferol (VITAMIN D) 1000  UNITS tablet Take 2,000 Units by mouth daily.    12/06/2014 at Unknown time  . ciprofloxacin (CIPRO) 500 MG tablet Take 1 tablet (500 mg total) by mouth 2 (two) times daily. One po bid x 7 days (Patient not taking: Reported on 05/22/2015) 14 tablet 0   . Dextromethorphan-Guaifenesin (CORICIDIN HBP CONGESTION/COUGH) 10-200 MG CAPS Take 1 capsule by mouth daily as needed (for cold symptoms).    Unknown at Unknown time  . docusate sodium (COLACE) 100 MG capsule Take 2 capsules (200 mg total) by mouth every morning. For stool softner 10 capsule  Unknown at Unknown time  . ferrous sulfate 325 (65 FE) MG tablet Take 1 tablet (325 mg total) by mouth daily with breakfast. For low iron   Unknown at Unknown time  . Fiber CHEW Chew 2 tablets by mouth daily.    Unknown at Unknown time  . glipiZIDE (GLUCOTROL) 10 MG tablet Take 10 mg by mouth 2  (two) times daily before a meal.   Unknown at Unknown time  . hydrochlorothiazide (MICROZIDE) 12.5 MG capsule Take 12.5 mg by mouth daily.   Unknown at Unknown time  . levothyroxine (SYNTHROID, LEVOTHROID) 150 MCG tablet Take 150 mcg by mouth daily before breakfast.   Unknown at Unknown time  . lithium carbonate 300 MG capsule Take 600 mg by mouth 2 (two) times daily.    Unknown at Unknown time  . lurasidone (LATUDA) 80 MG TABS tablet Take 80 mg by mouth at bedtime.   Unknown at Unknown time  . meclizine (ANTIVERT) 25 MG tablet Take 25 mg by mouth 2 (two) times daily as needed for dizziness.    Unknown at Unknown time  . prazosin (MINIPRESS) 1 MG capsule Take 1 capsule (1 mg total) by mouth at bedtime and may repeat dose one time if needed. For sleep/nightmares 60 capsule 0 Unknown at Unknown time  . propranolol (INDERAL) 20 MG tablet Take 20 mg by mouth every evening. Take 20 mg every evening.   Unknown at Unknown time  . propranolol (INDERAL) 40 MG tablet Take 40 mg by mouth 3 (three) times daily.   Unknown at Unknown time  . sitaGLIPtin (JANUVIA) 100 MG tablet Take 100 mg by mouth daily.   Unknown at Unknown time  . tolterodine (DETROL) 2 MG tablet Take 4 mg by mouth daily.   Unknown at Unknown time    ROS Pertinent ROS in HPI.  All other systems are negative.   Physical Exam   Blood pressure 140/69, pulse 69, temperature 97.9 F (36.6 C), temperature source Oral, resp. rate 18, height 5' 5.45" (1.662 m), weight 284 lb 9.6 oz (129.094 kg), last menstrual period 09/20/2011, SpO2 98 %.  Physical Exam  Constitutional: She is oriented to person, place, and time. She appears well-developed and well-nourished. No distress.  HENT:  Head: Normocephalic and atraumatic.  Eyes: Conjunctivae and EOM are normal.  Neck: Normal range of motion.  Cardiovascular: Normal rate and normal heart sounds.   Respiratory: Effort normal. No respiratory distress.  GI: Soft. She exhibits no distension. There is  no tenderness.  Genitourinary:  Scant white discharge Cervix absent Difficult exam due to habitus  Musculoskeletal: Normal range of motion. She exhibits no edema.  Neurological: She is alert and oriented to person, place, and time.  Skin: Skin is warm and dry.  Psychiatric: She has a normal mood and affect. Her behavior is normal.    MAU Course  Procedures  MDM Tylenol for discomfort BV on  wet prep Negative u/s - no cyst on ovaries  Assessment and Plan  A:  1. Bacterial vaginosis   2. Pain, pelvic, female    P: discharge to home Flagyl x 1 week No etoh/IC x 10 days F/u in clinic Patient may return to MAU as needed or if her condition were to change or worsen   Paticia Stack 05/22/2015, 4:15 PM

## 2015-05-24 LAB — URINE CULTURE: Special Requests: NORMAL

## 2015-05-25 LAB — GC/CHLAMYDIA PROBE AMP (~~LOC~~) NOT AT ARMC
Chlamydia: NEGATIVE
NEISSERIA GONORRHEA: NEGATIVE

## 2015-05-25 NOTE — ED Notes (Signed)
Final report of urine C&S shows multiple species present

## 2015-06-15 ENCOUNTER — Other Ambulatory Visit: Payer: Self-pay | Admitting: Physician Assistant

## 2015-06-15 ENCOUNTER — Emergency Department (INDEPENDENT_AMBULATORY_CARE_PROVIDER_SITE_OTHER)
Admission: EM | Admit: 2015-06-15 | Discharge: 2015-06-15 | Disposition: A | Discharge status: Home / Self Care | Payer: No Typology Code available for payment source | Source: Home / Self Care | Attending: Family Medicine | Admitting: Family Medicine

## 2015-06-15 ENCOUNTER — Encounter (HOSPITAL_COMMUNITY): Payer: Self-pay | Admitting: *Deleted

## 2015-06-15 DIAGNOSIS — N390 Urinary tract infection, site not specified: Secondary | ICD-10-CM

## 2015-06-15 LAB — POCT URINALYSIS DIP (DEVICE)
Bilirubin Urine: NEGATIVE
GLUCOSE, UA: 250 mg/dL — AB
KETONES UR: NEGATIVE mg/dL
Nitrite: NEGATIVE
Protein, ur: NEGATIVE mg/dL
Specific Gravity, Urine: 1.02 (ref 1.005–1.030)
Urobilinogen, UA: 0.2 mg/dL (ref 0.0–1.0)
pH: 6.5 (ref 5.0–8.0)

## 2015-06-15 MED ORDER — CIPROFLOXACIN HCL 500 MG PO TABS: 500.0000 mg | ORAL_TABLET | Freq: Two times a day (BID) | ORAL | Status: DC

## 2015-06-15 NOTE — ED Notes (Signed)
Pt  Reports   Symptoms  Of   Urinary      Frequency       Pressure  And  Burning  When  She  Urinates   She  Reports the  Symptoms  For  About 1  Week

## 2015-06-15 NOTE — ED Provider Notes (Signed)
CSN: [id number]     Arrival date & time 06/15/15  1532 History   First MD Initiated Contact with Patient 06/15/15 1700     Chief Complaint  Patient presents with  . Urinary Tract Infection   (Consider location/radiation/quality/duration/timing/severity/associated sxs/prior Treatment) Patient is a 45 y.o. female presenting with frequency. The history is provided by the patient.  Urinary Frequency This is a new problem. The current episode started more than 2 days ago. The problem occurs constantly. The problem has been gradually worsening. Pertinent negatives include no chest pain and no abdominal pain.    Past Medical History  Diagnosis Date  . Depression   . Hypertension   . Cancer ([place]) 2005    THYROID  . Fibroid     s/p myomectomy 2010, hysterectomy 2013  . PMS (premenstrual syndrome)   . Hormone disorder   . PCOS (polycystic ovarian syndrome)   . Grave's disease   . 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 Anemia   . Seasonal allergies   . Hypothyroidism   . Headache(784.0)     history - otc med prn  . Anxiety   . Vertigo   . Bipolar disorder ([place])   . Diabetes mellitus without complication ([place])   . PTSD (post-traumatic stress disorder)    Past Surgical History  Procedure Laterality Date  . Thyroidectomy    . Myomectomy  2010    LAPAROSCOPY  . Umbilical hernia repair  1982    as child  . Diagnostic laparoscopy    . Abdominal hysterectomy  10/11/2011    Procedure: HYSTERECTOMY ABDOMINAL;  Surgeon: Terrance Mass, MD;  Location: [facility];  Service: Gynecology;  Laterality: N/A;  With Repair of serosa.  . Cholecystectomy N/A 09/06/2013    Procedure: LAPAROSCOPIC CHOLECYSTECTOMY WITH INTRAOPERATIVE CHOLANGIOGRAM;  Surgeon: Merrie Roof, MD;  Location: [facility];  Service: General;  Laterality: N/A;   Family History  Problem Relation Age of Onset  . Thyroid disease Mother   . Diabetes Father   . Hypertension Father   . Heart disease  Father   . Cancer Maternal Aunt     BRAIN  . Cancer Maternal Grandmother     LYMPHOMA  . Hypertension Maternal Grandmother   . Cancer Paternal Grandmother     PANCREATIC  . Diabetes Paternal Grandmother   . Hypertension Paternal Grandmother   . Hypertension Maternal Grandfather   . Hypertension Paternal Grandfather    Social History  Substance Use Topics  . Smoking status: Never Smoker   . Smokeless tobacco: Never Used  . Alcohol Use: No   OB History    Gravida Para Term Preterm AB TAB SAB Ectopic Multiple Living   2 0   2  2   0     Review of Systems  Constitutional: Negative.   Cardiovascular: Negative for chest pain.  Gastrointestinal: Negative.  Negative for abdominal pain.  Genitourinary: Positive for dysuria, urgency and frequency. Negative for flank pain, decreased urine volume, vaginal bleeding, vaginal discharge, menstrual problem and pelvic pain.  Musculoskeletal: Negative.   All other systems reviewed and are negative.   Allergies  Aspirin; Codeine; Ibuprofen; Metformin and related; Morphine; and Penicillins  Home Medications   Prior to Admission medications   Medication Sig Start Date End Date Taking? Authorizing Provider  cholecalciferol (VITAMIN D) 1000 UNITS tablet Take 2,000 Units by mouth daily.  03/14/12   Darrol Jump, MD  ciprofloxacin (CIPRO)  500 MG tablet Take 1 tablet (500 mg total) by mouth 2 (two) times daily. 06/15/15   Billy Fischer, MD  Dextromethorphan-Guaifenesin (CORICIDIN HBP CONGESTION/COUGH) 10-200 MG CAPS Take 1 capsule by mouth daily as needed (for cold symptoms).     Historical Provider, MD  docusate sodium (COLACE) 100 MG capsule Take 2 capsules (200 mg total) by mouth every morning. For stool softner 03/14/12   Darrol Jump, MD  ferrous sulfate 325 (65 FE) MG tablet Take 1 tablet (325 mg total) by mouth daily with breakfast. For low iron 03/14/12   Darrol Jump, MD  Fiber CHEW Chew 2 tablets by mouth daily.     Historical Provider,  MD  glipiZIDE (GLUCOTROL) 10 MG tablet Take 10 mg by mouth 2 (two) times daily before a meal.    Historical Provider, MD  hydrochlorothiazide (MICROZIDE) 12.5 MG capsule Take 12.5 mg by mouth daily.    Historical Provider, MD  levothyroxine (SYNTHROID, LEVOTHROID) 150 MCG tablet Take 150 mcg by mouth daily before breakfast.    Historical Provider, MD  lithium carbonate 300 MG capsule Take 600 mg by mouth 2 (two) times daily.     Historical Provider, MD  lurasidone (LATUDA) 80 MG TABS tablet Take 80 mg by mouth at bedtime.    Historical Provider, MD  meclizine (ANTIVERT) 25 MG tablet Take 25 mg by mouth 2 (two) times daily as needed for dizziness.     Historical Provider, MD  metroNIDAZOLE (FLAGYL) 500 MG tablet Take 1 tablet (500 mg total) by mouth 2 (two) times daily. 05/22/15   Paticia Stack, PA-C  prazosin (MINIPRESS) 1 MG capsule Take 1 capsule (1 mg total) by mouth at bedtime and may repeat dose one time if needed. For sleep/nightmares 03/14/12   Darrol Jump, MD  propranolol (INDERAL) 20 MG tablet Take 20 mg by mouth every evening. Take 20 mg every evening.    Historical Provider, MD  propranolol (INDERAL) 40 MG tablet Take 40 mg by mouth 3 (three) times daily.    Historical Provider, MD  sitaGLIPtin (JANUVIA) 100 MG tablet Take 100 mg by mouth daily.    Historical Provider, MD  tolterodine (DETROL) 2 MG tablet Take 4 mg by mouth daily.    Historical Provider, MD   Meds Ordered and Administered this Visit  Medications - No data to display  BP 131/84 mmHg  Pulse 68  Temp(Src) 98.4 F (36.9 C) (Oral)  Resp 18  SpO2 98%  LMP 09/20/2011 No data found.   Physical Exam  Constitutional: She is oriented to person, place, and time. She appears well-developed and well-nourished.  Abdominal: Soft. Bowel sounds are normal. She exhibits no distension and no mass. There is tenderness in the suprapubic area. There is no rigidity, no rebound, no guarding and no CVA tenderness.   Neurological: She is alert and oriented to person, place, and time.  Skin: Skin is warm and dry.  Nursing note and vitals reviewed.   ED Course  Procedures (including critical care time)  Labs Review Labs Reviewed  POCT URINALYSIS DIP (DEVICE) - Abnormal; Notable for the following:    Glucose, UA 250 (*)    Hgb urine dipstick SMALL (*)    Leukocytes, UA LARGE (*)    All other components within normal limits  URINE CULTURE   U/a abnl  Imaging Review No results found.   Visual Acuity Review  Right Eye Distance:   Left Eye Distance:   Bilateral Distance:  Right Eye Near:   Left Eye Near:    Bilateral Near:         MDM   1. UTI (lower urinary tract infection)        Billy Fischer, MD 06/15/15 [phone]

## 2015-06-15 NOTE — Discharge Instructions (Signed)
Take all of medicine as directed, drink lots of fluids, see your doctor if further problems. °

## 2015-08-12 ENCOUNTER — Encounter (HOSPITAL_COMMUNITY): Payer: Self-pay | Admitting: *Deleted

## 2015-08-12 ENCOUNTER — Emergency Department (HOSPITAL_COMMUNITY)
Admission: EM | Admit: 2015-08-12 | Discharge: 2015-08-12 | Disposition: A | Discharge status: Home / Self Care | Payer: No Typology Code available for payment source | Attending: Emergency Medicine | Admitting: Emergency Medicine

## 2015-08-12 DIAGNOSIS — F319 Bipolar disorder, unspecified: Secondary | ICD-10-CM | POA: Insufficient documentation

## 2015-08-12 DIAGNOSIS — L989 Disorder of the skin and subcutaneous tissue, unspecified: Secondary | ICD-10-CM | POA: Insufficient documentation

## 2015-08-12 DIAGNOSIS — Z794 Long term (current) use of insulin: Secondary | ICD-10-CM | POA: Insufficient documentation

## 2015-08-12 DIAGNOSIS — Z880 Allergy status to penicillin: Secondary | ICD-10-CM | POA: Insufficient documentation

## 2015-08-12 DIAGNOSIS — F419 Anxiety disorder, unspecified: Secondary | ICD-10-CM | POA: Insufficient documentation

## 2015-08-12 DIAGNOSIS — Z85850 Personal history of malignant neoplasm of thyroid: Secondary | ICD-10-CM | POA: Insufficient documentation

## 2015-08-12 DIAGNOSIS — Z8742 Personal history of other diseases of the female genital tract: Secondary | ICD-10-CM | POA: Insufficient documentation

## 2015-08-12 DIAGNOSIS — E039 Hypothyroidism, unspecified: Secondary | ICD-10-CM | POA: Insufficient documentation

## 2015-08-12 DIAGNOSIS — R51 Headache: Secondary | ICD-10-CM | POA: Insufficient documentation

## 2015-08-12 DIAGNOSIS — R519 Headache, unspecified: Secondary | ICD-10-CM

## 2015-08-12 DIAGNOSIS — F4310 Post-traumatic stress disorder, unspecified: Secondary | ICD-10-CM | POA: Insufficient documentation

## 2015-08-12 DIAGNOSIS — I10 Essential (primary) hypertension: Secondary | ICD-10-CM | POA: Insufficient documentation

## 2015-08-12 DIAGNOSIS — E119 Type 2 diabetes mellitus without complications: Secondary | ICD-10-CM | POA: Insufficient documentation

## 2015-08-12 DIAGNOSIS — Z86018 Personal history of other benign neoplasm: Secondary | ICD-10-CM | POA: Insufficient documentation

## 2015-08-12 DIAGNOSIS — D649 Anemia, unspecified: Secondary | ICD-10-CM | POA: Insufficient documentation

## 2015-08-12 NOTE — ED Provider Notes (Signed)
CSN: [id number]     Arrival date & time 08/12/15  1643 History   First MD Initiated Contact with Patient 08/12/15 1810     Chief Complaint  Patient presents with  . Headache   HPI   44 year old female presents today with complaints of headache. Patient reports that she was sleeping today woke up to get ready for work she went into the bathroom and noticed that her lips were blue. Patient denies any significant shortness of breath or chest pain at that time. She reports a slow onset of headache thereafter, described as throbbing, frontal behind the eyes. She denies any radiation of symptoms or focal neurological deficits. She reports history of headaches similar to this they resolve on their own without intervention. Patient reports she's never seen her lips blue before, was concerned. For the time my evaluation she reports that her lip color has returned to baseline. Patient denied any focal neurological deficits.    Past Medical History  Diagnosis Date  . Depression   . Hypertension   . Cancer ([place]) 2005    THYROID  . Fibroid     s/p myomectomy 2010, hysterectomy 2013  . PMS (premenstrual syndrome)   . Hormone disorder   . PCOS (polycystic ovarian syndrome)   . Grave's disease   . 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 Anemia   . Seasonal allergies   . Hypothyroidism   . Headache(784.0)     history - otc med prn  . Anxiety   . Vertigo   . Bipolar disorder ([place])   . Diabetes mellitus without complication ([place])   . PTSD (post-traumatic stress disorder)    Past Surgical History  Procedure Laterality Date  . Thyroidectomy    . Myomectomy  2010    LAPAROSCOPY  . Umbilical hernia repair  1982    as child  . Diagnostic laparoscopy    . Abdominal hysterectomy  10/11/2011    Procedure: HYSTERECTOMY ABDOMINAL;  Surgeon: Terrance Mass, MD;  Location: [facility];  Service: Gynecology;  Laterality: N/A;  With Repair of serosa.  . Cholecystectomy N/A  09/06/2013    Procedure: LAPAROSCOPIC CHOLECYSTECTOMY WITH INTRAOPERATIVE CHOLANGIOGRAM;  Surgeon: Merrie Roof, MD;  Location: [facility];  Service: General;  Laterality: N/A;   Family History  Problem Relation Age of Onset  . Thyroid disease Mother   . Diabetes Father   . Hypertension Father   . Heart disease Father   . Cancer Maternal Aunt     BRAIN  . Cancer Maternal Grandmother     LYMPHOMA  . Hypertension Maternal Grandmother   . Cancer Paternal Grandmother     PANCREATIC  . Diabetes Paternal Grandmother   . Hypertension Paternal Grandmother   . Hypertension Maternal Grandfather   . Hypertension Paternal Grandfather    Social History  Substance Use Topics  . Smoking status: Never Smoker   . Smokeless tobacco: Never Used  . Alcohol Use: No   OB History    Gravida Para Term Preterm AB TAB SAB Ectopic Multiple Living   2 0   2  2   0     Review of Systems  All other systems reviewed and are negative.  Allergies  Aspirin; Codeine; Ibuprofen; Metformin and related; Morphine; and Penicillins  Home Medications   Prior to Admission medications   Medication Sig Start Date End Date Taking? Authorizing Provider  cholecalciferol (VITAMIN D) 1000 UNITS tablet Take  2,000 Units by mouth daily.  03/14/12  Yes Darrol Jump, MD  docusate sodium (COLACE) 100 MG capsule Take 2 capsules (200 mg total) by mouth every morning. For stool softner Patient taking differently: Take 100 mg by mouth every morning. For stool softner 03/14/12  Yes Darrol Jump, MD  ferrous sulfate 325 (65 FE) MG tablet Take 1 tablet (325 mg total) by mouth daily with breakfast. For low iron Patient taking differently: Take 325 mg by mouth 2 (two) times daily with a meal. For low iron 03/14/12  Yes Darrol Jump, MD  Fiber CHEW Chew 2 tablets by mouth daily.    Yes Historical Provider, MD  glipiZIDE (GLUCOTROL) 10 MG tablet Take 10 mg by mouth 3 (three) times daily.    Yes Historical Provider, MD   hydrochlorothiazide (MICROZIDE) 12.5 MG capsule Take 12.5 mg by mouth daily.   Yes Historical Provider, MD  insulin glargine (LANTUS) 100 UNIT/ML injection Inject 20 Units into the skin at bedtime.   Yes Historical Provider, MD  levothyroxine (SYNTHROID, LEVOTHROID) 100 MCG tablet Take 100 mcg by mouth daily before breakfast.   Yes Historical Provider, MD  lithium carbonate 300 MG capsule Take 600 mg by mouth 2 (two) times daily.    Yes Historical Provider, MD  lurasidone (LATUDA) 80 MG TABS tablet Take 80 mg by mouth at bedtime.   Yes Historical Provider, MD  meclizine (ANTIVERT) 25 MG tablet Take 25 mg by mouth 2 (two) times daily as needed for dizziness.    Yes Historical Provider, MD  prazosin (MINIPRESS) 1 MG capsule Take 1 capsule (1 mg total) by mouth at bedtime and may repeat dose one time if needed. For sleep/nightmares 03/14/12  Yes Darrol Jump, MD  propranolol (INDERAL) 40 MG tablet Take 40 mg by mouth 3 (three) times daily.   Yes Historical Provider, MD  sitaGLIPtin (JANUVIA) 100 MG tablet Take 100 mg by mouth daily.   Yes Historical Provider, MD  tolterodine (DETROL) 2 MG tablet Take 2 mg by mouth 2 (two) times daily.    Yes Historical Provider, MD  ciprofloxacin (CIPRO) 500 MG tablet Take 1 tablet (500 mg total) by mouth 2 (two) times daily. Patient not taking: Reported on 08/12/2015 06/15/15   Billy Fischer, MD  metroNIDAZOLE (FLAGYL) 500 MG tablet Take 1 tablet (500 mg total) by mouth 2 (two) times daily. Patient not taking: Reported on 08/12/2015 05/22/15   Collene Leyden Teague Clark, PA-C   BP 97/51 mmHg  Pulse 86  Temp(Src) 98.1 F (36.7 C) (Oral)  Resp 18  SpO2 98%  LMP 09/20/2011   Physical Exam  Constitutional: She is oriented to person, place, and time. She appears well-developed and well-nourished.  HENT:  Head: Normocephalic and atraumatic.  Eyes: Conjunctivae are normal. Pupils are equal, round, and reactive to light. Right eye exhibits no discharge. Left eye exhibits no  discharge. No scleral icterus.  Neck: Normal range of motion. No JVD present. No tracheal deviation present.  Cardiovascular: Normal rate, regular rhythm, normal heart sounds and intact distal pulses.  Exam reveals no gallop and no friction rub.   No murmur heard. Pulmonary/Chest: Effort normal and breath sounds normal. No stridor. No respiratory distress. She has no wheezes. She has no rales. She exhibits no tenderness.  Musculoskeletal: Normal range of motion. She exhibits no edema or tenderness.  Neurological: She is alert and oriented to person, place, and time. She has normal strength. No cranial nerve deficit or sensory deficit.  Coordination and gait normal. GCS eye subscore is 4. GCS verbal subscore is 5. GCS motor subscore is 6.  Reflex Scores:      Patellar reflexes are 2+ on the right side and 2+ on the left side. Psychiatric: She has a normal mood and affect. Her behavior is normal. Judgment and thought content normal.  Nursing note and vitals reviewed.   ED Course  Procedures (including critical care time)   Labs Review Labs Reviewed - No data to display  Imaging Review No results found. I have personally reviewed and evaluated these images and lab results as part of my medical decision-making.   EKG Interpretation None      MDM   Final diagnoses:  Nonintractable headache, unspecified chronicity pattern, unspecified headache type    Labs:   Imaging:  Consults:  Therapeutics:  Discharge Meds:   Assessment/Plan: 45 year old female presents today with complaints of a headache.Patient is laughing in joyful as I walk into the room, joking around with her significant other in no acute distress. Patient's headache has no red flags, similar to previous. Patient was offered medication here in the ED and she refused.Patient has no neurological deficits, or headache red flags. She reports that this will likely resolve without intervention. She had reports of her lips  being blue, this was not evident at the time my evaluation, she had no associated chest pain or shortness of breath, this is unlikely to be pulmonary cardiac related.Vision is perk negative. There is some question of obstructive sleep apnea in this patient as she appears to be at risk for this. Patient will be instructed to follow up with her primary care provider inform him of today's visit and all relevant data. She is instructed to return emergently to the ED if any new or worsening signs or symptoms present. Both the patient and her significant other in the room verbalizes understanding and agreement to today's plan and had no further questions or concerns at the time of discharge.         Okey Regal, PA-C 08/12/15 2029  Orlie Dakin, MD 08/13/15 [phone]

## 2015-08-12 NOTE — ED Notes (Signed)
Pt states she started shaking and had a migraine starting at 1330. States when she looked in the mirror her lips were blue. pts lips do not appear blue in triage. Pt has not taken anything for the migraine.

## 2015-08-12 NOTE — Discharge Instructions (Signed)
Please read attached information. If you experience any new or worsening signs or symptoms please return to the emergency room for evaluation. Please follow-up with your primary care provider or specialist as discussed. Please use medication prescribed only as directed and discontinue taking if you have any concerning signs or symptoms.   °

## 2015-09-14 ENCOUNTER — Ambulatory Visit: Payer: No Typology Code available for payment source | Attending: Podiatry

## 2015-09-14 ENCOUNTER — Other Ambulatory Visit: Payer: Self-pay | Admitting: Sports Medicine

## 2015-09-14 DIAGNOSIS — B353 Tinea pedis: Secondary | ICD-10-CM

## 2015-09-14 MED ORDER — CLOTRIMAZOLE 1 % EX SOLN: 1.0000 "application " | Freq: Two times a day (BID) | CUTANEOUS | Status: DC

## 2015-09-14 MED ORDER — CLOTRIMAZOLE-BETAMETHASONE 1-0.05 % EX CREA: 1.0000 "application " | TOPICAL_CREAM | Freq: Two times a day (BID) | CUTANEOUS | Status: DC

## 2015-09-30 ENCOUNTER — Emergency Department (HOSPITAL_COMMUNITY): Payer: Self-pay

## 2015-09-30 ENCOUNTER — Observation Stay (HOSPITAL_COMMUNITY)
Admission: EM | Admit: 2015-09-30 | Discharge: 2015-10-02 | Disposition: A | Discharge status: Home / Self Care | Payer: Self-pay | Attending: Internal Medicine | Admitting: Internal Medicine

## 2015-09-30 ENCOUNTER — Encounter (HOSPITAL_COMMUNITY): Payer: Self-pay | Admitting: *Deleted

## 2015-09-30 DIAGNOSIS — F429 Obsessive-compulsive disorder, unspecified: Secondary | ICD-10-CM | POA: Diagnosis present

## 2015-09-30 DIAGNOSIS — E349 Endocrine disorder, unspecified: Secondary | ICD-10-CM | POA: Insufficient documentation

## 2015-09-30 DIAGNOSIS — Z79899 Other long term (current) drug therapy: Secondary | ICD-10-CM | POA: Insufficient documentation

## 2015-09-30 DIAGNOSIS — E282 Polycystic ovarian syndrome: Secondary | ICD-10-CM | POA: Insufficient documentation

## 2015-09-30 DIAGNOSIS — F329 Major depressive disorder, single episode, unspecified: Secondary | ICD-10-CM | POA: Insufficient documentation

## 2015-09-30 DIAGNOSIS — E0500 Thyrotoxicosis with diffuse goiter without thyrotoxic crisis or storm: Secondary | ICD-10-CM | POA: Insufficient documentation

## 2015-09-30 DIAGNOSIS — I10 Essential (primary) hypertension: Secondary | ICD-10-CM | POA: Insufficient documentation

## 2015-09-30 DIAGNOSIS — Z794 Long term (current) use of insulin: Secondary | ICD-10-CM | POA: Insufficient documentation

## 2015-09-30 DIAGNOSIS — Z880 Allergy status to penicillin: Secondary | ICD-10-CM | POA: Insufficient documentation

## 2015-09-30 DIAGNOSIS — R51 Headache: Secondary | ICD-10-CM | POA: Insufficient documentation

## 2015-09-30 DIAGNOSIS — E1165 Type 2 diabetes mellitus with hyperglycemia: Secondary | ICD-10-CM

## 2015-09-30 DIAGNOSIS — D500 Iron deficiency anemia secondary to blood loss (chronic): Secondary | ICD-10-CM | POA: Insufficient documentation

## 2015-09-30 DIAGNOSIS — M1990 Unspecified osteoarthritis, unspecified site: Secondary | ICD-10-CM | POA: Insufficient documentation

## 2015-09-30 DIAGNOSIS — E039 Hypothyroidism, unspecified: Secondary | ICD-10-CM | POA: Insufficient documentation

## 2015-09-30 DIAGNOSIS — R079 Chest pain, unspecified: Principal | ICD-10-CM | POA: Insufficient documentation

## 2015-09-30 DIAGNOSIS — R072 Precordial pain: Secondary | ICD-10-CM

## 2015-09-30 DIAGNOSIS — D649 Anemia, unspecified: Secondary | ICD-10-CM | POA: Insufficient documentation

## 2015-09-30 DIAGNOSIS — F339 Major depressive disorder, recurrent, unspecified: Secondary | ICD-10-CM | POA: Diagnosis present

## 2015-09-30 DIAGNOSIS — F419 Anxiety disorder, unspecified: Secondary | ICD-10-CM | POA: Insufficient documentation

## 2015-09-30 DIAGNOSIS — N943 Premenstrual tension syndrome: Secondary | ICD-10-CM | POA: Insufficient documentation

## 2015-09-30 DIAGNOSIS — R011 Cardiac murmur, unspecified: Secondary | ICD-10-CM | POA: Insufficient documentation

## 2015-09-30 DIAGNOSIS — R0602 Shortness of breath: Secondary | ICD-10-CM | POA: Insufficient documentation

## 2015-09-30 DIAGNOSIS — E119 Type 2 diabetes mellitus without complications: Secondary | ICD-10-CM | POA: Insufficient documentation

## 2015-09-30 DIAGNOSIS — Z7984 Long term (current) use of oral hypoglycemic drugs: Secondary | ICD-10-CM | POA: Insufficient documentation

## 2015-09-30 HISTORY — DX: Unspecified osteoarthritis, unspecified site: M19.90

## 2015-09-30 HISTORY — DX: Malignant neoplasm of thyroid gland: C73

## 2015-09-30 HISTORY — DX: Cardiac murmur, unspecified: R01.1

## 2015-09-30 HISTORY — DX: Type 2 diabetes mellitus without complications: E11.9

## 2015-09-30 HISTORY — DX: Migraine, unspecified, not intractable, without status migrainosus: G43.909

## 2015-09-30 LAB — BASIC METABOLIC PANEL
ANION GAP: 10 (ref 5–15)
BUN: 8 mg/dL (ref 6–20)
CHLORIDE: 102 mmol/L (ref 101–111)
CO2: 24 mmol/L (ref 22–32)
Calcium: 9.1 mg/dL (ref 8.9–10.3)
Creatinine, Ser: 0.99 mg/dL (ref 0.44–1.00)
Glucose, Bld: 207 mg/dL — ABNORMAL HIGH (ref 65–99)
POTASSIUM: 3.6 mmol/L (ref 3.5–5.1)
Sodium: 136 mmol/L (ref 135–145)

## 2015-09-30 LAB — D-DIMER, QUANTITATIVE (NOT AT ARMC): D DIMER QUANT: 0.33 ug{FEU}/mL (ref 0.00–0.50)

## 2015-09-30 LAB — CBC
HEMATOCRIT: 34 % — AB (ref 36.0–46.0)
HEMOGLOBIN: 10.5 g/dL — AB (ref 12.0–15.0)
MCH: 26.4 pg (ref 26.0–34.0)
MCHC: 30.9 g/dL (ref 30.0–36.0)
MCV: 85.4 fL (ref 78.0–100.0)
Platelets: 402 10*3/uL — ABNORMAL HIGH (ref 150–400)
RBC: 3.98 MIL/uL (ref 3.87–5.11)
RDW: 15.3 % (ref 11.5–15.5)
WBC: 12.3 10*3/uL — AB (ref 4.0–10.5)

## 2015-09-30 LAB — TROPONIN I
Troponin I: <0.03 ng/mL (ref <0.031)
Troponin I: <0.03 ng/mL (ref <0.031)

## 2015-09-30 LAB — CBG MONITORING, ED
GLUCOSE-CAPILLARY: 130 mg/dL — AB (ref 65–99)
GLUCOSE-CAPILLARY: 161 mg/dL — AB (ref 65–99)
Glucose-Capillary: 119 mg/dL — ABNORMAL HIGH (ref 65–99)

## 2015-09-30 LAB — TSH: TSH: 15.201 u[IU]/mL — ABNORMAL HIGH (ref 0.350–4.500)

## 2015-09-30 LAB — LIPID PANEL
CHOL/HDL RATIO: 3.4 ratio
Cholesterol: 132 mg/dL (ref 0–200)
HDL: 39 mg/dL — AB (ref >40)
LDL CALC: 71 mg/dL (ref 0–99)
TRIGLYCERIDES: 111 mg/dL (ref <150)
VLDL: 22 mg/dL (ref 0–40)

## 2015-09-30 LAB — T4, FREE: Free T4: 0.82 ng/dL (ref 0.61–1.12)

## 2015-09-30 LAB — GLUCOSE, CAPILLARY: Glucose-Capillary: 154 mg/dL — ABNORMAL HIGH (ref 65–99)

## 2015-09-30 LAB — I-STAT TROPONIN, ED: Troponin i, poc: 0.01 ng/mL (ref 0.00–0.08)

## 2015-09-30 MED ORDER — VITAMIN D 1000 UNITS PO TABS
2000.0000 [IU] | ORAL_TABLET | Freq: Every day | ORAL | Status: DC
  Administered 2015-09-30 – 2015-10-02 (×3): 2000 [IU] via ORAL
  Filled 2015-09-30 (×3): qty 2

## 2015-09-30 MED ORDER — LURASIDONE HCL 80 MG PO TABS
80.0000 mg | ORAL_TABLET | Freq: Every day | ORAL | Status: DC
  Administered 2015-09-30 – 2015-10-01 (×2): 80 mg via ORAL
  Filled 2015-09-30 (×3): qty 1

## 2015-09-30 MED ORDER — INSULIN ASPART 100 UNIT/ML ~~LOC~~ SOLN
0.0000 [IU] | Freq: Three times a day (TID) | SUBCUTANEOUS | Status: DC
  Administered 2015-09-30: 3 [IU] via SUBCUTANEOUS
  Administered 2015-09-30: 2 [IU] via SUBCUTANEOUS
  Administered 2015-10-01 – 2015-10-02 (×4): 3 [IU] via SUBCUTANEOUS
  Filled 2015-09-30 (×2): qty 1

## 2015-09-30 MED ORDER — HYDROCHLOROTHIAZIDE 12.5 MG PO CAPS
12.5000 mg | ORAL_CAPSULE | Freq: Every day | ORAL | Status: DC
  Administered 2015-09-30 – 2015-10-02 (×3): 12.5 mg via ORAL
  Filled 2015-09-30 (×3): qty 1

## 2015-09-30 MED ORDER — LEVOTHYROXINE SODIUM 100 MCG PO TABS
100.0000 ug | ORAL_TABLET | Freq: Every day | ORAL | Status: DC
  Administered 2015-09-30 – 2015-10-02 (×3): 100 ug via ORAL
  Filled 2015-09-30 (×4): qty 1

## 2015-09-30 MED ORDER — ENOXAPARIN SODIUM 40 MG/0.4ML ~~LOC~~ SOLN
40.0000 mg | SUBCUTANEOUS | Status: DC
  Administered 2015-09-30 – 2015-10-01 (×2): 40 mg via SUBCUTANEOUS
  Filled 2015-09-30 (×3): qty 0.4

## 2015-09-30 MED ORDER — CLOTRIMAZOLE 1 % EX CREA
TOPICAL_CREAM | Freq: Two times a day (BID) | CUTANEOUS | Status: DC
  Administered 2015-09-30 (×2): via TOPICAL
  Administered 2015-10-01: 1 via TOPICAL
  Administered 2015-10-01 – 2015-10-02 (×2): via TOPICAL
  Filled 2015-09-30: qty 15

## 2015-09-30 MED ORDER — FERROUS SULFATE 325 (65 FE) MG PO TABS
325.0000 mg | ORAL_TABLET | Freq: Two times a day (BID) | ORAL | Status: DC
  Administered 2015-09-30 – 2015-10-01 (×4): 325 mg via ORAL
  Filled 2015-09-30 (×5): qty 1

## 2015-09-30 MED ORDER — INSULIN GLARGINE 100 UNIT/ML ~~LOC~~ SOLN
20.0000 [IU] | Freq: Every day | SUBCUTANEOUS | Status: DC
Start: 2015-09-30 — End: 2015-10-02
  Administered 2015-09-30 – 2015-10-01 (×2): 20 [IU] via SUBCUTANEOUS
  Filled 2015-09-30 (×3): qty 0.2

## 2015-09-30 MED ORDER — ONDANSETRON HCL 4 MG/2ML IJ SOLN: 4.0000 mg | Freq: Four times a day (QID) | INTRAMUSCULAR | Status: DC | PRN

## 2015-09-30 MED ORDER — MECLIZINE HCL 25 MG PO TABS: 25.0000 mg | ORAL_TABLET | Freq: Two times a day (BID) | ORAL | Status: DC | PRN

## 2015-09-30 MED ORDER — DOCUSATE SODIUM 100 MG PO CAPS
100.0000 mg | ORAL_CAPSULE | Freq: Every day | ORAL | Status: DC
  Administered 2015-09-30 – 2015-10-01 (×2): 100 mg via ORAL
  Filled 2015-09-30 (×2): qty 1

## 2015-09-30 MED ORDER — ACETAMINOPHEN 325 MG PO TABS: 650.0000 mg | ORAL_TABLET | ORAL | Status: DC | PRN

## 2015-09-30 MED ORDER — NITROGLYCERIN 0.4 MG SL SUBL
0.4000 mg | SUBLINGUAL_TABLET | SUBLINGUAL | Status: DC | PRN
  Administered 2015-09-30 (×2): 0.4 mg via SUBLINGUAL
  Filled 2015-09-30 (×2): qty 1

## 2015-09-30 MED ORDER — LITHIUM CARBONATE 300 MG PO CAPS
600.0000 mg | ORAL_CAPSULE | Freq: Two times a day (BID) | ORAL | Status: DC
  Administered 2015-09-30 – 2015-10-02 (×5): 600 mg via ORAL
  Filled 2015-09-30 (×6): qty 2

## 2015-09-30 MED ORDER — ATORVASTATIN CALCIUM 40 MG PO TABS
40.0000 mg | ORAL_TABLET | Freq: Every day | ORAL | Status: DC
  Administered 2015-09-30 – 2015-10-01 (×2): 40 mg via ORAL
  Filled 2015-09-30 (×2): qty 1

## 2015-09-30 NOTE — ED Notes (Signed)
Pt ambulated to restroom. 

## 2015-09-30 NOTE — ED Notes (Signed)
Lunch tray at bedside. ?

## 2015-09-30 NOTE — ED Notes (Signed)
Attempted report x1. 

## 2015-09-30 NOTE — Consult Note (Signed)
CARDIOLOGY CONSULT NOTE   Patient ID: Lynn Martinez MRN: [id number], DOB/AGE: July 12, 1970   Admit date: 09/30/2015 Date of Consult: 09/30/2015   Primary Physician: Elbert Ewings, [place] Primary Cardiologist: new Consulting physician: Dr. Myna Hidalgo  Pt. Profile  [name] is a morbidly obese 45 year old African-American female with past medical history of insulin-dependent DM, hypertension, hypothyroidism, bipolar, depression, PCOS, and Graves' disease presented with prolonged chest pain at rest  Problem List  Past Medical History  Diagnosis Date  . Depression   . Hypertension   . Cancer ([place]) 2005    THYROID  . Fibroid     s/p myomectomy 2010, hysterectomy 2013  . PMS (premenstrual syndrome)   . Hormone disorder   . PCOS (polycystic ovarian syndrome)   . Grave's disease   . 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 Anemia   . Seasonal allergies   . Hypothyroidism   . Headache(784.0)     history - otc med prn  . Anxiety   . Vertigo   . Bipolar disorder ([place])   . Diabetes mellitus without complication ([place])   . PTSD (post-traumatic stress disorder)     Past Surgical History  Procedure Laterality Date  . Thyroidectomy    . Myomectomy  2010    LAPAROSCOPY  . Umbilical hernia repair  1982    as child  . Diagnostic laparoscopy    . Abdominal hysterectomy  10/11/2011    Procedure: HYSTERECTOMY ABDOMINAL;  Surgeon: Terrance Mass, MD;  Location: [facility];  Service: Gynecology;  Laterality: N/A;  With Repair of serosa.  . Cholecystectomy N/A 09/06/2013    Procedure: LAPAROSCOPIC CHOLECYSTECTOMY WITH INTRAOPERATIVE CHOLANGIOGRAM;  Surgeon: Merrie Roof, MD;  Location: [facility];  Service: General;  Laterality: N/A;     Allergies  Allergies  Allergen Reactions  . Aspirin Hives  . Codeine Itching    Tolerates Hydrocodone OK.  . Ibuprofen Other (See Comments)    Happened in childhood  . Metformin And Related Other (See Comments)    Muscle  weakness  . Morphine Other (See Comments)    Throat swelling  . Penicillins Other (See Comments)    Childhood reaction  Has patient had a PCN reaction causing immediate rash, facial/tongue/throat swelling, SOB or lightheadedness with hypotension: unknown Has patient had a PCN reaction causing severe rash involving mucus membranes or skin necrosis: unknown Has patient had a PCN reaction that required hospitalization unknown Has patient had a PCN reaction occurring within the last 10 years: unknown If all of the above answers are "NO", then may proceed with Cephalosporin use.    HPI   [name] is a morbidly obese 45 year old African-American female with past medical history of insulin-dependent DM, hypertension, hypothyroidism, bipolar, depression, PCOS, and Graves' disease. She has no prior cardiac history. However she does have significant family history of early CAD with her father having MI in his 72s and eventually led to triple bypass. Although her mother did not have history of MI, she does have heart failure. She denies any smoking history or illicit drug use. She recently started a new job and has been having issues with management and under a lot of stress. 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 She has never noticed any previous exertional symptom.  She was in her usual state of health until she started having sharp stabbing chest pain radiating to the back around midnight  in the morning of 09/30/2015. She says the symptoms improve with walking around. Chest pain is worse with deep inspiration and palpation. She initially tried to lay down, however symptom got worse, therefore she decided to seek medical attention at [facility]. The chest discomfort waxed and waned however and never completely went away. She had some shortness of breath and dizziness at the start of her chest pain but no diaphoresis, no recent signs of  infection including fever, chill, or cough. She denies any recent lower extremity edema, orthopnea or paroxysmal nocturnal dyspnea. Troponin was negative 2. EKG showed no acute ST-T wave changes. She was admitted by internal medicine service. Cardiology was consulted for chest pain. By the time cardiology has seen the patient, she has been having persistent chest pain for the past 16 hours. She says the nitroglycerin did alleviate the discomfort to some degree however never completely taken away.   Inpatient Medications  . atorvastatin  40 mg Oral q1800  . cholecalciferol  2,000 Units Oral Daily  . clotrimazole   Topical BID  . docusate sodium  100 mg Oral QHS  . enoxaparin (LOVENOX) injection  40 mg Subcutaneous Q24H  . ferrous sulfate  325 mg Oral BID WC  . hydrochlorothiazide  12.5 mg Oral Daily  . insulin aspart  0-15 Units Subcutaneous TID WC  . insulin glargine  20 Units Subcutaneous QHS  . levothyroxine  100 mcg Oral QAC breakfast  . lithium carbonate  600 mg Oral BID  . lurasidone  80 mg Oral QHS    Family History Family History  Problem Relation Age of Onset  . Thyroid disease Mother   . Diabetes Father   . Hypertension Father   . Heart disease Father   . Cancer Maternal Aunt     BRAIN  . Cancer Maternal Grandmother     LYMPHOMA  . Hypertension Maternal Grandmother   . Cancer Paternal Grandmother     PANCREATIC  . Diabetes Paternal Grandmother   . Hypertension Paternal Grandmother   . Hypertension Maternal Grandfather   . Hypertension Paternal Grandfather      Social History Social History   Social History  . Marital Status: Divorced    Spouse Name: N/A  . Number of Children: N/A  . Years of Education: N/A   Occupational History  . Not on file.   Social History Main Topics  . Smoking status: Never Smoker   . Smokeless tobacco: Never Used  . Alcohol Use: No  . Drug Use: No  . Sexual Activity: Yes    Birth Control/ Protection: Condom, Coitus  interruptus, Surgical   Other Topics Concern  . Not on file   Social History Narrative     Review of Systems  General:  No chills, fever, night sweats or weight changes.  Cardiovascular:  No dyspnea on exertion, edema, orthopnea, palpitations, paroxysmal nocturnal dyspnea. +chest pain Dermatological: No rash, lesions/masses Respiratory: No cough +dyspnea Urologic: No hematuria, dysuria Abdominal:   No nausea, vomiting, diarrhea, bright red blood per rectum, melena, or hematemesis Neurologic:  No visual changes, wkns, changes in mental status. All other systems reviewed and are otherwise negative except as noted above.  Physical Exam  Blood pressure 129/65, pulse 72, resp. rate 16, last menstrual period 09/20/2011, SpO2 100 %.  General: Pleasant, NAD Psych: Normal affect. Neuro: Alert and oriented X 3. Moves all extremities spontaneously. HEENT: Normal  Neck: Supple without bruits or JVD. Lungs:  Resp regular and unlabored, CTA. Heart: RRR  no s3, s4, or murmurs. Abdomen: Soft, non-tender, non-distended, BS + x 4.  Extremities: No clubbing, cyanosis or edema. DP/PT/Radials 2+ and equal bilaterally.  Labs   Recent Labs  09/30/15 0611 09/30/15 1200  TROPONINI <0.03 <0.03   Lab Results  Component Value Date   WBC 12.3* 09/30/2015   HGB 10.5* 09/30/2015   HCT 34.0* 09/30/2015   MCV 85.4 09/30/2015   PLT 402* 09/30/2015     Recent Labs Lab 09/30/15 0105  NA 136  K 3.6  CL 102  CO2 24  BUN 8  CREATININE 0.99  CALCIUM 9.1  GLUCOSE 207*   Lab Results  Component Value Date   CHOL 132 09/30/2015   HDL 39* 09/30/2015   LDLCALC 71 09/30/2015   TRIG 111 09/30/2015   Lab Results  Component Value Date   DDIMER 0.33 09/30/2015    Radiology/Studies  Dg Chest 2 View  09/30/2015  CLINICAL DATA:  Pain and tightness in the chest tonight with shortness of breath. EXAM: CHEST  2 VIEW COMPARISON:  09/05/2013 FINDINGS: The heart size and mediastinal contours are  within normal limits. Both lungs are clear. The visualized skeletal structures are unremarkable. IMPRESSION: No active cardiopulmonary disease. Electronically Signed   By: Lucienne Capers M.D.   On: 09/30/2015 01:30    ECG  Normal sinus rhythm without significant ST-T wave changes.  ASSESSMENT AND PLAN  1. Prolonged atypical chest pain with negative troponin and EKG changes  - Although with her sedentary lifestyle, she is at risk for PE, however she is fairly bradycardic in the ED, she is not short of breath sitting down, doubt PE. With sharp stabbing pain radiating to the back, initially worried about aortic dissection, however she has equal radial pulses bilaterally, mildly diminished DP pulse in the left lower extremity, normal pulse in the right lower extremity. Her blood pressure has been normal in the ED which make aortic dissection less likely. However may still need to consider a CTA of chest  - Will discuss with M.D., her body weight make perfusion image less ideal, maybe she can tolerate exercise tolerance test.  2. insulin-dependent DM  3. Hypertension  4. hypothyroidism, TSH high, but free T4 normal.   Signed, Almyra Deforest, PA-C 09/30/2015, 4:21 PM  Patient seen, examined. Available data reviewed. Agree with findings, assessment, and plan as outlined by Almyra Deforest, PA-C. The patient is independently interviewed and examined. She is a very pleasant, young, morbidly obese woman in no distress. Lungs are clear. JVP is normal. Heart is regular rate and rhythm without murmur or gallop. There is no peripheral edema. EKG is essentially within normal limits and cardiac troponins are negative. The patient's cardiac risk factors include obesity, insulin-dependent diabetes, hypertension, and family history of premature CAD. Her body habitus makes noninvasive cardiac assessment challenging. I think it is reasonable to perform an non-imaging exercise treadmill study which will be arranged for  tomorrow morning. If this study is negative, she can be treated for noncardiac chest pain. Other etiologies of chest pain such as pulmonary embolus or aortic dissection are unlikely in this young patient with well-controlled hypertension and no evidence of significant distress on evaluation.   Sherren Mocha, M.D. 09/30/2015 5:07 PM

## 2015-09-30 NOTE — ED Notes (Signed)
2 RNs attempted IV without success. IV team consulted.

## 2015-09-30 NOTE — ED Notes (Signed)
Pt CBG, 161. Nurse was notified.

## 2015-09-30 NOTE — H&P (Signed)
History and Physical    Lynn Martinez [id number] DOB: 19-Apr-1971 DOA: 09/30/2015  Referring Provider: EDP PCP: Elbert Ewings, [place]  Outpatient Specialists: Dr. Toney Rakes (Gynecology)   Patient coming from: home   Chief Complaint: Chest pain  HPI: Lynn Martinez is a 45 y.o. female with medical history significant for insulin-dependent diabetes mellitus, hypertension, hypothyroidism, OCD, and major depressive disorder who presents the emergency department with a "squeezing" pain in the central chest with radiation through to the back. Patient was at rest and seated shortly after midnight on 09/30/2015 when she experienced acute onset of pain in the central chest with radiation through to the back. There was associated headache and dyspnea, but no nausea, diaphoresis, or involvement of the neck, jaw, shoulder, or arm. Patient denies experiencing similar symptoms previously. Symptoms were unrelenting and she came into the ED for evaluation. She did not try any specific interventions prior to her presentation. She had otherwise been in her usual state with no recent illness. Her fianc at the bedside and notes that she is just started a new job and has been under a lot of stress lately surrounding that.   ED Course: Upon arrival to the ED, patient is found to be afebrile, saturating well on room air, with borderline bradycardia and soft blood pressures. Chest x-ray is negative for acute cardiopulmonary disease and EKG features a sinus rhythm with PR interval of 205 ms. Troponin is 0.01, BMP is notable only for serum glucose of 207, and CBC features a leukocytosis to 12,300, hemoglobin of 10.5, and platelet count of 402,000. Patient was given a sublingual nitroglycerin with no appreciable change in her symptoms. She remained hemodynamically stable in the emergency department with no ischemic changes observed on telemetry. Given her persistent chest pain, the patient will be admitted to the  stepdown unit for ongoing evaluation and management of chest pain atypical of, but concerning for, ACS.  Review of Systems:  All other systems reviewed and apart from HPI, are negative.  Past Medical History  Diagnosis Date  . Depression   . Hypertension   . Cancer ([place]) 2005    THYROID  . Fibroid     s/p myomectomy 2010, hysterectomy 2013  . PMS (premenstrual syndrome)   . Hormone disorder   . PCOS (polycystic ovarian syndrome)   . Grave's disease   . 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 Anemia   . Seasonal allergies   . Hypothyroidism   . Headache(784.0)     history - otc med prn  . Anxiety   . Vertigo   . Bipolar disorder ([place])   . Diabetes mellitus without complication ([place])   . PTSD (post-traumatic stress disorder)     Past Surgical History  Procedure Laterality Date  . Thyroidectomy    . Myomectomy  2010    LAPAROSCOPY  . Umbilical hernia repair  1982    as child  . Diagnostic laparoscopy    . Abdominal hysterectomy  10/11/2011    Procedure: HYSTERECTOMY ABDOMINAL;  Surgeon: Terrance Mass, MD;  Location: [facility];  Service: Gynecology;  Laterality: N/A;  With Repair of serosa.  . Cholecystectomy N/A 09/06/2013    Procedure: LAPAROSCOPIC CHOLECYSTECTOMY WITH INTRAOPERATIVE CHOLANGIOGRAM;  Surgeon: Merrie Roof, MD;  Location: [facility];  Service: General;  Laterality: N/A;     reports that she has never smoked. She has never used smokeless tobacco. She reports that she does not drink  alcohol or use illicit drugs.  Allergies  Allergen Reactions  . Aspirin Hives  . Codeine Itching    Tolerates Hydrocodone OK.  . Ibuprofen Other (See Comments)    Happened in childhood  . Metformin And Related Other (See Comments)    Muscle weakness  . Morphine Other (See Comments)    Throat swelling  . Penicillins Other (See Comments)    Childhood reaction  Has patient had a PCN reaction causing immediate rash, facial/tongue/throat swelling,  SOB or lightheadedness with hypotension: unknown Has patient had a PCN reaction causing severe rash involving mucus membranes or skin necrosis: unknown Has patient had a PCN reaction that required hospitalization unknown Has patient had a PCN reaction occurring within the last 10 years: unknown If all of the above answers are "NO", then may proceed with Cephalosporin use.    Family History  Problem Relation Age of Onset  . Thyroid disease Mother   . Diabetes Father   . Hypertension Father   . Heart disease Father   . Cancer Maternal Aunt     BRAIN  . Cancer Maternal Grandmother     LYMPHOMA  . Hypertension Maternal Grandmother   . Cancer Paternal Grandmother     PANCREATIC  . Diabetes Paternal Grandmother   . Hypertension Paternal Grandmother   . Hypertension Maternal Grandfather   . Hypertension Paternal Grandfather      Prior to Admission medications   Medication Sig Start Date End Date Taking? Authorizing Provider  cholecalciferol (VITAMIN D) 1000 UNITS tablet Take 2,000 Units by mouth daily.  03/14/12  Yes Darrol Jump, MD  clotrimazole-betamethasone (LOTRISONE) cream Apply 1 application topically 2 (two) times daily. To bottoms of both feet 09/14/15  Yes Titorya Stover, DPM  docusate sodium (COLACE) 100 MG capsule Take 2 capsules (200 mg total) by mouth every morning. For stool softner Patient taking differently: Take 100 mg by mouth at bedtime. For stool softner 03/14/12  Yes Darrol Jump, MD  ferrous sulfate 325 (65 FE) MG tablet Take 1 tablet (325 mg total) by mouth daily with breakfast. For low iron Patient taking differently: Take 325 mg by mouth 2 (two) times daily with a meal. For low iron 03/14/12  Yes Darrol Jump, MD  Fiber CHEW Chew 2 tablets by mouth daily.    Yes Historical Provider, MD  glipiZIDE (GLUCOTROL) 10 MG tablet Take 10 mg by mouth 3 (three) times daily.    Yes Historical Provider, MD  hydrochlorothiazide (MICROZIDE) 12.5 MG capsule Take 12.5 mg by  mouth daily.   Yes Historical Provider, MD  insulin glargine (LANTUS) 100 UNIT/ML injection Inject 34 Units into the skin at bedtime.    Yes Historical Provider, MD  levothyroxine (SYNTHROID, LEVOTHROID) 100 MCG tablet Take 100 mcg by mouth daily before breakfast.   Yes Historical Provider, MD  lithium carbonate 300 MG capsule Take 600 mg by mouth 2 (two) times daily.    Yes Historical Provider, MD  lurasidone (LATUDA) 80 MG TABS tablet Take 80 mg by mouth at bedtime.   Yes Historical Provider, MD  meclizine (ANTIVERT) 25 MG tablet Take 25 mg by mouth 2 (two) times daily as needed for dizziness.    Yes Historical Provider, MD  prazosin (MINIPRESS) 1 MG capsule Take 1 capsule (1 mg total) by mouth at bedtime and may repeat dose one time if needed. For sleep/nightmares 03/14/12  Yes Darrol Jump, MD  propranolol (INDERAL) 40 MG tablet Take 40 mg by mouth 2 (two)  times daily.    Yes Historical Provider, MD  sitaGLIPtin (JANUVIA) 100 MG tablet Take 100 mg by mouth daily.   Yes Historical Provider, MD  tolterodine (DETROL) 2 MG tablet Take 2 mg by mouth 2 (two) times daily.    Yes Historical Provider, MD  clotrimazole (LOTRIMIN) 1 % external solution Apply 1 application topically 2 (two) times daily. In between toes Patient not taking: Reported on 09/30/2015 09/14/15   Landis Martins, DPM    Physical Exam: Filed Vitals:   09/30/15 0415 09/30/15 0445 09/30/15 0519 09/30/15 0530  BP: 112/79 104/52 115/69 110/69  Pulse: 57 51 65 60  Resp: 18 18 20 20   SpO2: 99% 100% 99% 97%      Constitutional: NAD, calm, comfortable Eyes: PERTLA, lids and conjunctivae normal ENMT: Mucous membranes are moist. Posterior pharynx clear of any exudate or lesions. Normal dentition.  Neck: normal, supple, no masses, no thyromegaly Respiratory: clear to auscultation bilaterally, no wheezing, no crackles. Normal respiratory effort. No accessory muscle use.  Cardiovascular: S1 & S2 heard, regular rate and rhythm, no  murmurs / rubs / gallops. No extremity edema. 2+ pedal pulses. No carotid bruits.  Abdomen: No distension, no tenderness, no masses palpated. No hepatosplenomegaly. Bowel sounds normal.  Musculoskeletal: no clubbing / cyanosis. No joint deformity upper and lower extremities. Good ROM, no contractures. Normal muscle tone.  Skin: no rashes, lesions, ulcers. No induration Neurologic: CN 2-12 grossly intact. Sensation intact, DTR normal. Strength 5/5 in all 4 limbs.  Psychiatric: Normal judgment and insight. Alert and oriented x 3. Normal mood.     Labs on Admission: I have personally reviewed following labs and imaging studies  CBC:  Recent Labs Lab 09/30/15 0105  WBC 12.3*  HGB 10.5*  HCT 34.0*  MCV 85.4  PLT [id number]   Basic Metabolic Panel:  Recent Labs Lab 09/30/15 0105  NA 136  K 3.6  CL 102  CO2 24  GLUCOSE 207*  BUN 8  CREATININE 0.99  CALCIUM 9.1   GFR: CrCl cannot be calculated (Unknown ideal weight.). Liver Function Tests: No results for input(s): AST, ALT, ALKPHOS, BILITOT, PROT, ALBUMIN in the last 168 hours. No results for input(s): LIPASE, AMYLASE in the last 168 hours. No results for input(s): AMMONIA in the last 168 hours. Coagulation Profile: No results for input(s): INR, PROTIME in the last 168 hours. Cardiac Enzymes: No results for input(s): CKTOTAL, CKMB, CKMBINDEX, TROPONINI in the last 168 hours. BNP (last 3 results) No results for input(s): PROBNP in the last 8760 hours. HbA1C: No results for input(s): HGBA1C in the last 72 hours. CBG: No results for input(s): GLUCAP in the last 168 hours. Lipid Profile: No results for input(s): CHOL, HDL, LDLCALC, TRIG, CHOLHDL, LDLDIRECT in the last 72 hours. Thyroid Function Tests: No results for input(s): TSH, T4TOTAL, FREET4, T3FREE, THYROIDAB in the last 72 hours. Anemia Panel: No results for input(s): VITAMINB12, FOLATE, FERRITIN, TIBC, IRON, RETICCTPCT in the last 72 hours. Urine analysis:      Component Value Date/Time   COLORURINE YELLOW 05/22/2015 [place] 05/22/2015 1545   LABSPEC 1.020 06/15/2015 1708   PHURINE 6.5 06/15/2015 1708   GLUCOSEU 250* 06/15/2015 1708   HGBUR SMALL* 06/15/2015 1708   BILIRUBINUR NEGATIVE 06/15/2015 1708   KETONESUR NEGATIVE 06/15/2015 1708   PROTEINUR NEGATIVE 06/15/2015 1708   UROBILINOGEN 0.2 06/15/2015 1708   NITRITE NEGATIVE 06/15/2015 1708   LEUKOCYTESUR LARGE* 06/15/2015 1708   Sepsis Labs: @LABRCNTIP (procalcitonin:4,lacticidven:4) )No results found for this  or any previous visit (from the past 240 hour(s)).   Radiological Exams on Admission: Dg Chest 2 View  09/30/2015  CLINICAL DATA:  Pain and tightness in the chest tonight with shortness of breath. EXAM: CHEST  2 VIEW COMPARISON:  09/05/2013 FINDINGS: The heart size and mediastinal contours are within normal limits. Both lungs are clear. The visualized skeletal structures are unremarkable. IMPRESSION: No active cardiopulmonary disease. Electronically Signed   By: Lucienne Capers M.D.   On: 09/30/2015 01:30    EKG: Independently reviewed. Sinus rhythm, 1st degree AV block   Assessment/Plan  1. Chest pain - Started at rest and unchanged with exertion - Initial troponin 0.01 and EKG without acute ischemic features  - SL NTG given x1 in ED without appreciable change in CP  - Monitor on telemetry for ischemic changes  - Obtain serial troponin measurements  - Repeat EKG in the am  - ASA held d/t reported allergy  - Risk-stratify with lipid panel and A1c, pending   2. Type II DM  - Managed with Lantus 34 units qHS, glipizide 10 mg TID, and Januvia 100 mg qD at home  - Hold Januvia and glipizide while admitted  - Continue treatment with reduced-dose Lantus and moderate-intensity SSI correctional, adjust prn  - Check CBG with meals and qHS  - A1c pending  3. Hypertension   - BP running on low side currently  - Managed with HCTZ at home; also taking  propranolol and prazosin  - Continue HCTZ  - Hold propranolol and prazosin for now given low BP and bradycardia    4. OCD, major depression  - Appears to be stable on admission  - Continue Lithium and Latuda   5. Hypothyroidism  - Post-surgical  - Continue current-dose Synthroid 100 mcg qD for now - Check TSH and free T4 given atypical CP    6. Anemia, leukocytosis, thrombocytosis  - WBC 12,300; Hgb 10.5; plt 402,000 on admission  - All indices stable relative to prior measurements  - Continue iron supplementation  - No sign of active infection or blood loss    DVT prophylaxis: sq Lovenox  Code Status: Full  Family Communication: Fiance at bedside  Disposition Plan: Admit to stepdown  Consults called: None  Admission status: Observation    Vianne Bulls MD [facility] Pager [phone]  If 7PM-7AM, please contact night-coverage [email or web address] Password [email or web address]  09/30/2015, 5:50 AM

## 2015-09-30 NOTE — ED Notes (Signed)
Regular dinner tray ordered @ 1654pm.

## 2015-09-30 NOTE — ED Provider Notes (Signed)
CSN: [id number]     Arrival date & time 09/30/15  0043 History   By signing my name below, I, Forrestine Him, attest that this documentation has been prepared under the direction and in the presence of Delora Fuel, MD.  Electronically Signed: Forrestine Him, ED Scribe. 09/30/2015. 3:21 AM.   Chief Complaint  Patient presents with  . Chest Pain  . Back Pain   The history is provided by the patient. No language interpreter was used.    HPI Comments: Lynn Martinez is a 45 y.o. female with a PMHx of HTN, thyroid cancer, Grave's disease, hypothyroidism, DM, and Bipolar 1 disorder who presents to the Emergency Department complaining of constant, ongoing, worsening chest pain located between her shoulder blades onset 3 hours while resting. Currently she rates pain 9/10 and described as stabbing. Discomfort is exacerbated when supine and mildly improved with exertion. She also reports ongoing shortness of breath and HA. No OTC medications or home remedies attempted prior to arrival. No recent fever, chills, nausea, vomiting, diarrhea, dizziness, or lightheadedness. Husband states she has recently been under a lot of stress as she started a new job in the last several days. She is not a smoker. No recent long distance travel. Past family history includes CHF; multiple family members. Father had an MI in his 48's. PSHx includes cholecystectomy 09/05/2103.  PCP: Elbert Ewings, FNP    Past Medical History  Diagnosis Date  . Depression   . Hypertension   . Cancer ([place]) 2005    THYROID  . Fibroid     s/p myomectomy 2010, hysterectomy 2013  . PMS (premenstrual syndrome)   . Hormone disorder   . PCOS (polycystic ovarian syndrome)   . Grave's disease   . 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 Anemia   . Seasonal allergies   . Hypothyroidism   . Headache(784.0)     history - otc med prn  . Anxiety   . Vertigo   . Bipolar disorder ([place])   . Diabetes mellitus without  complication ([place])   . PTSD (post-traumatic stress disorder)    Past Surgical History  Procedure Laterality Date  . Thyroidectomy    . Myomectomy  2010    LAPAROSCOPY  . Umbilical hernia repair  1982    as child  . Diagnostic laparoscopy    . Abdominal hysterectomy  10/11/2011    Procedure: HYSTERECTOMY ABDOMINAL;  Surgeon: Terrance Mass, MD;  Location: [facility];  Service: Gynecology;  Laterality: N/A;  With Repair of serosa.  . Cholecystectomy N/A 09/06/2013    Procedure: LAPAROSCOPIC CHOLECYSTECTOMY WITH INTRAOPERATIVE CHOLANGIOGRAM;  Surgeon: Merrie Roof, MD;  Location: [facility];  Service: General;  Laterality: N/A;   Family History  Problem Relation Age of Onset  . Thyroid disease Mother   . Diabetes Father   . Hypertension Father   . Heart disease Father   . Cancer Maternal Aunt     BRAIN  . Cancer Maternal Grandmother     LYMPHOMA  . Hypertension Maternal Grandmother   . Cancer Paternal Grandmother     PANCREATIC  . Diabetes Paternal Grandmother   . Hypertension Paternal Grandmother   . Hypertension Maternal Grandfather   . Hypertension Paternal Grandfather    Social History  Substance Use Topics  . Smoking status: Never Smoker   . Smokeless tobacco: Never Used  . Alcohol Use: No   OB History    Gravida Para  Term Preterm AB TAB SAB Ectopic Multiple Living   2 0   2  2   0     Review of Systems  Constitutional: Negative for fever and chills.  Respiratory: Positive for shortness of breath.   Cardiovascular: Positive for chest pain.  Gastrointestinal: Negative for nausea, vomiting and abdominal pain.  Neurological: Positive for headaches.  Psychiatric/Behavioral: Negative for confusion.  All other systems reviewed and are negative.     Allergies  Aspirin; Codeine; Ibuprofen; Metformin and related; Morphine; and Penicillins  Home Medications   Prior to Admission medications   Medication Sig Start Date End Date Taking? Authorizing Provider   cholecalciferol (VITAMIN D) 1000 UNITS tablet Take 2,000 Units by mouth daily.  03/14/12   Darrol Jump, MD  ciprofloxacin (CIPRO) 500 MG tablet Take 1 tablet (500 mg total) by mouth 2 (two) times daily. Patient not taking: Reported on 08/12/2015 06/15/15   Billy Fischer, MD  clotrimazole (LOTRIMIN) 1 % external solution Apply 1 application topically 2 (two) times daily. In between toes 09/14/15   Landis Martins, DPM  clotrimazole-betamethasone (LOTRISONE) cream Apply 1 application topically 2 (two) times daily. To bottoms of both feet 09/14/15   Landis Martins, DPM  docusate sodium (COLACE) 100 MG capsule Take 2 capsules (200 mg total) by mouth every morning. For stool softner Patient taking differently: Take 100 mg by mouth every morning. For stool softner 03/14/12   Darrol Jump, MD  ferrous sulfate 325 (65 FE) MG tablet Take 1 tablet (325 mg total) by mouth daily with breakfast. For low iron Patient taking differently: Take 325 mg by mouth 2 (two) times daily with a meal. For low iron 03/14/12   Darrol Jump, MD  Fiber CHEW Chew 2 tablets by mouth daily.     Historical Provider, MD  glipiZIDE (GLUCOTROL) 10 MG tablet Take 10 mg by mouth 3 (three) times daily.     Historical Provider, MD  hydrochlorothiazide (MICROZIDE) 12.5 MG capsule Take 12.5 mg by mouth daily.    Historical Provider, MD  insulin glargine (LANTUS) 100 UNIT/ML injection Inject 20 Units into the skin at bedtime.    Historical Provider, MD  levothyroxine (SYNTHROID, LEVOTHROID) 100 MCG tablet Take 100 mcg by mouth daily before breakfast.    Historical Provider, MD  lithium carbonate 300 MG capsule Take 600 mg by mouth 2 (two) times daily.     Historical Provider, MD  lurasidone (LATUDA) 80 MG TABS tablet Take 80 mg by mouth at bedtime.    Historical Provider, MD  meclizine (ANTIVERT) 25 MG tablet Take 25 mg by mouth 2 (two) times daily as needed for dizziness.     Historical Provider, MD  metroNIDAZOLE (FLAGYL) 500 MG tablet Take 1  tablet (500 mg total) by mouth 2 (two) times daily. Patient not taking: Reported on 08/12/2015 05/22/15   Paticia Stack, PA-C  prazosin (MINIPRESS) 1 MG capsule Take 1 capsule (1 mg total) by mouth at bedtime and may repeat dose one time if needed. For sleep/nightmares 03/14/12   Darrol Jump, MD  propranolol (INDERAL) 40 MG tablet Take 40 mg by mouth 3 (three) times daily.    Historical Provider, MD  sitaGLIPtin (JANUVIA) 100 MG tablet Take 100 mg by mouth daily.    Historical Provider, MD  tolterodine (DETROL) 2 MG tablet Take 2 mg by mouth 2 (two) times daily.     Historical Provider, MD   Triage Vitals: BP 127/75 mmHg  Pulse 55  Resp 18  SpO2 100%  LMP 09/20/2011   Physical Exam  Constitutional: She is oriented to person, place, and time. She appears well-developed and well-nourished. No distress.  HENT:  Head: Normocephalic and atraumatic.  Eyes: EOM are normal. Pupils are equal, round, and reactive to light.  Neck: Normal range of motion. Neck supple. No JVD present.  Cardiovascular: Normal rate, regular rhythm and normal heart sounds.   No murmur heard. Pulmonary/Chest: Effort normal and breath sounds normal. She has no wheezes. She has no rales. She exhibits no tenderness.  Abdominal: Soft. Bowel sounds are normal. She exhibits no distension and no mass. There is no tenderness.  Musculoskeletal: Normal range of motion. She exhibits no edema.  Lymphadenopathy:    She has no cervical adenopathy.  Neurological: She is alert and oriented to person, place, and time. No cranial nerve deficit. She exhibits normal muscle tone. Coordination normal.  Skin: Skin is warm and dry. No rash noted.  Psychiatric: She has a normal mood and affect. Her behavior is normal. Judgment and thought content normal.  Nursing note and vitals reviewed.   ED Course  Procedures (including critical care time)  DIAGNOSTIC STUDIES: Oxygen Saturation is 100% on RA, Normal by my interpretation.     COORDINATION OF CARE: 3:06 AM- Will order blood work, EKG, and CXR. Discussed treatment plan with pt at bedside and pt agreed to plan.     Labs Review Results for orders placed or performed during the hospital encounter of [id number]  Basic metabolic panel  Result Value Ref Range   Sodium 136 135 - 145 mmol/L   Potassium 3.6 3.5 - 5.1 mmol/L   Chloride 102 101 - 111 mmol/L   CO2 24 22 - 32 mmol/L   Glucose, Bld 207 (H) 65 - 99 mg/dL   BUN 8 6 - 20 mg/dL   Creatinine, Ser 0.99 0.44 - 1.00 mg/dL   Calcium 9.1 8.9 - 10.3 mg/dL   GFR calc non Af Amer >60 >60 mL/min   GFR calc Af Amer >60 >60 mL/min   Anion gap 10 5 - 15  CBC  Result Value Ref Range   WBC 12.3 (H) 4.0 - 10.5 K/uL   RBC 3.98 3.87 - 5.11 MIL/uL   Hemoglobin 10.5 (L) 12.0 - 15.0 g/dL   HCT 34.0 (L) 36.0 - 46.0 %   MCV 85.4 78.0 - 100.0 fL   MCH 26.4 26.0 - 34.0 pg   MCHC 30.9 30.0 - 36.0 g/dL   RDW 15.3 11.5 - 15.5 %   Platelets 402 (H) 150 - 400 K/uL  D-dimer, quantitative  Result Value Ref Range   D-Dimer, Quant 0.33 0.00 - 0.50 ug/mL-FEU  TSH  Result Value Ref Range   TSH 15.201 (H) 0.350 - 4.500 uIU/mL  Troponin I  Result Value Ref Range   Troponin I <0.03 <0.031 ng/mL  Lipid panel  Result Value Ref Range   Cholesterol 132 0 - 200 mg/dL   Triglycerides 111 <150 mg/dL   HDL 39 (L) >40 mg/dL   Total CHOL/HDL Ratio 3.4 RATIO   VLDL 22 0 - 40 mg/dL   LDL Cholesterol 71 0 - 99 mg/dL  I-stat troponin, ED  Result Value Ref Range   Troponin i, poc 0.01 0.00 - 0.08 ng/mL   Comment 3          CBG monitoring, ED  Result Value Ref Range   Glucose-Capillary 130 (H) 65 - 99 mg/dL   Imaging Review Dg Chest  2 View  09/30/2015  CLINICAL DATA:  Pain and tightness in the chest tonight with shortness of breath. EXAM: CHEST  2 VIEW COMPARISON:  09/05/2013 FINDINGS: The heart size and mediastinal contours are within normal limits. Both lungs are clear. The visualized skeletal structures are unremarkable. IMPRESSION:  No active cardiopulmonary disease. Electronically Signed   By: Lucienne Capers M.D.   On: 09/30/2015 01:30   I have personally reviewed and evaluated these images and lab results as part of my medical decision-making.   EKG Interpretation Rhythm: Sinus Rate: 63  Axis: Normal Intervals: Borderline prolonged PR interval ST-T wave: Borderline T-wave abnormality Compared with ECG of 09/05/2013, no significant changes are seen.   MDM   Final diagnoses:  Chest pain, unspecified chest pain type  Normochromic normocytic anemia    Interscapular pain of uncertain cause. She does have significant cardiac risk factors of diabetes, hyperlipidemia, tobacco use as well as positive family history of premature coronary artery disease. 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 Chest x-ray is unremarkable and d-dimer is come back normal. Troponin is negative and ECG is unremarkable. Case is discussed with Dr. Myna Hidalgo [facility] who agrees to admit the patient.  I personally performed the services described in this documentation, which was scribed in my presence. The recorded information has been reviewed and is accurate.      Delora Fuel, MD [id number]

## 2015-09-30 NOTE — ED Notes (Signed)
Pt states around 0015 this morning pt reported having a squeeze in the center of her chest radiating to her center back when lying in bed. Pt states chest pain was associated with shortness of breath and headache. Pt denies dizziness, lightheadedness, n/v.

## 2015-10-01 ENCOUNTER — Observation Stay (HOSPITAL_COMMUNITY): Payer: Self-pay

## 2015-10-01 ENCOUNTER — Observation Stay (HOSPITAL_BASED_OUTPATIENT_CLINIC_OR_DEPARTMENT_OTHER): Payer: No Typology Code available for payment source

## 2015-10-01 DIAGNOSIS — I10 Essential (primary) hypertension: Secondary | ICD-10-CM

## 2015-10-01 DIAGNOSIS — D649 Anemia, unspecified: Secondary | ICD-10-CM

## 2015-10-01 DIAGNOSIS — R079 Chest pain, unspecified: Secondary | ICD-10-CM

## 2015-10-01 DIAGNOSIS — E890 Postprocedural hypothyroidism: Secondary | ICD-10-CM

## 2015-10-01 DIAGNOSIS — Z794 Long term (current) use of insulin: Secondary | ICD-10-CM

## 2015-10-01 DIAGNOSIS — E119 Type 2 diabetes mellitus without complications: Secondary | ICD-10-CM

## 2015-10-01 LAB — GLUCOSE, CAPILLARY
GLUCOSE-CAPILLARY: 200 mg/dL — AB (ref 65–99)
Glucose-Capillary: 160 mg/dL — ABNORMAL HIGH (ref 65–99)
Glucose-Capillary: 192 mg/dL — ABNORMAL HIGH (ref 65–99)
Glucose-Capillary: 151 mg/dL — ABNORMAL HIGH (ref 65–99)

## 2015-10-01 LAB — HEMOGLOBIN A1C
HEMOGLOBIN A1C: 9.1 % — AB (ref 4.8–5.6)
Mean Plasma Glucose: 214 mg/dL

## 2015-10-01 LAB — MRSA PCR SCREENING: MRSA by PCR: NEGATIVE

## 2015-10-01 MED ORDER — REGADENOSON 0.4 MG/5ML IV SOLN
INTRAVENOUS | Status: AC
  Filled 2015-10-01: qty 5

## 2015-10-01 MED ORDER — REGADENOSON 0.4 MG/5ML IV SOLN
0.4000 mg | Freq: Once | INTRAVENOUS | Status: AC
  Administered 2015-10-01: 0.4 mg via INTRAVENOUS

## 2015-10-01 NOTE — Progress Notes (Signed)
Pt could barely walk on treadmill and we could not get good reading despite adjusting electrodes, test stopped and will proceed with lexiscan myoview.

## 2015-10-01 NOTE — Plan of Care (Signed)
Problem: Education: Goal: Knowledge of [facility] Education information/materials will improve Outcome: Progressing Patient aware of plan of care.  RN discussed medications with patient prior to administration on all medications administered thus far this shift. Patient has denied pain thus far this shift.  RN educated patient if she started to experience any pain to notify staff immediately.  Patient stated understanding.

## 2015-10-01 NOTE — Progress Notes (Signed)
lexiscan portion of TWO day study completed.  nuc results to follow tomorrow.

## 2015-10-01 NOTE — Progress Notes (Signed)
PROGRESS NOTE    Lynn Martinez  [id number] DOB: February 22, 1971 DOA: 09/30/2015 PCP: Elbert Ewings, FNP (Confirm with patient/family/NH records and if not entered, this HAS to be entered at [facility] point of entry. "No PCP" if truly none.) Outpatient Specialists: Theme park manager speciality and name if known)    Brief Narrative:  45 year old female with history of diabetes mellitus, hypertension, morbid obesity, sedentary lifestyle, hypothyroidism comes in with chest pain at rest. ardiac enzymes 3 are negative Cardiology has been consultation Patient was unable to walk on the treadmill. Patient underwent Lexiscan. 2 dayimaging is pending   Assessment & Plan:   Principal Problem:   Pain in the chest Active Problems:   Hypothyroidism   ANEMIA-NOS   Major depression, recurrent (HCC)   OCD (obsessive compulsive disorder)   Hypertension   Insulin dependent diabetes mellitus ([place])   Chest pain  1. Chest pain - Started at rest and unchanged with exertion -  troponin x 3 neg  - EKG without acute ischemic features  - SL NTG given x1 in ED without appreciable change in CP  -consult cardiology considering multiple risk factors, underwent Lexiscan - ASA held d/t reported allergy  - 2. Type II DM  - Managed with Lantus 34 units qHS, glipizide 10 mg TID, and Januvia 100 mg qD at home  - Hold Januvia and glipizide while admitted  - Continue treatment with reduced-dose Lantus and moderate-intensity SSI correctional, adjust prn  - Check CBG with meals and qHS  - A1c is 9.1. -Concern about noncompliance with the diet  3. Hypertension  - Managed with HCTZ at home; also taking propranolol and prazosin  - Continue HCTZ  - Hold propranolol and prazosin for now given low BP and bradycardia   4. OCD, major depression  - Appears to be stable on admission  - Continue Lithium and Latuda   5. Hypothyroidism  - Post-surgical  - Continue current-dose Synthroid 100 mcg qD for now -  Check TSH and free T4 given atypical CP   6. Anemia, leukocytosis, thrombocytosis  - WBC 12,300; Hgb 10.5; plt 402,000 on admission  - All indices stable relative to prior measurements  - Continue iron supplementation  - No sign of active infection or blood loss    DVT prophylaxis: sq Lovenox  Code Status: Full    DVT prophylaxis: (Lovenox/Heparin/SCD's/anticoagulated/None (if comfort care) Code Status: (Full/Partial - specify details) Family Communication: (Specify name, relationship & date discussed. NO "discussed with patient") Disposition Plan: (specify when and where you expect patient to be discharged)   Consultants:  Cardiology     Subjective: Denies any chest pain or shortness of breath  Objective: Filed Vitals:   10/01/15 1036 10/01/15 1037 10/01/15 1039 10/01/15 1040  BP: 117/61  130/61 122/63  Pulse: 97 91 89 87  Temp:      TempSrc:      Resp:      Height:      Weight:      SpO2:        Intake/Output Summary (Last 24 hours) at 10/01/15 1514 Last data filed at 10/01/15 1330  Gross per 24 hour  Intake    840 ml  Output    700 ml  Net    140 ml   Filed Weights   09/30/15 2050 10/01/15 0444  Weight: 127.597 kg (281 lb 4.8 oz) 127.234 kg (280 lb 8 oz)    Examination:  General exam: Appears calm and comfortable  Respiratory system: Clear to auscultation. Respiratory effort  normal. Cardiovascular system: S1 & S2 heard, RRR. No JVD, murmurs, rubs, gallops or clicks. No pedal edema. Gastrointestinal system: Abdomen is nondistended, soft and nontender. No organomegaly or masses felt. Normal bowel sounds heard. Central nervous system: Alert and oriented. No focal neurological deficits. Extremities: Symmetric 5 x 5 power. Skin: No rashes, lesions or ulcers Psychiatry: Judgement and insight appear normal. Mood & affect appropriate.     Data Reviewed: I have personally reviewed following labs and imaging studies  CBC:  Recent Labs Lab  09/30/15 0105  WBC 12.3*  HGB 10.5*  HCT 34.0*  MCV 85.4  PLT [id number]*   Basic Metabolic Panel:  Recent Labs Lab 09/30/15 0105  NA 136  K 3.6  CL 102  CO2 24  GLUCOSE 207*  BUN 8  CREATININE 0.99  CALCIUM 9.1   GFR: Estimated Creatinine Clearance: 102.6 mL/min (by C-G formula based on Cr of 0.99). Liver Function Tests: No results for input(s): AST, ALT, ALKPHOS, BILITOT, PROT, ALBUMIN in the last 168 hours. No results for input(s): LIPASE, AMYLASE in the last 168 hours. No results for input(s): AMMONIA in the last 168 hours. Coagulation Profile: No results for input(s): INR, PROTIME in the last 168 hours. Cardiac Enzymes:  Recent Labs Lab 09/30/15 0611 09/30/15 1200 09/30/15 1836  TROPONINI <0.03 <0.03 <0.03   BNP (last 3 results) No results for input(s): PROBNP in the last 8760 hours. HbA1C:  Recent Labs  09/30/15 0611  HGBA1C 9.1*   CBG:  Recent Labs Lab 09/30/15 1207 09/30/15 1705 09/30/15 2051 10/01/15 0728 10/01/15 1233  GLUCAP 161* 119* 154* 160* 200*   Lipid Profile:  Recent Labs  09/30/15 0611  CHOL 132  HDL 39*  LDLCALC 71  TRIG 111  CHOLHDL 3.4   Thyroid Function Tests:  Recent Labs  09/30/15 0611  TSH 15.201*  FREET4 0.82   Anemia Panel: No results for input(s): VITAMINB12, FOLATE, FERRITIN, TIBC, IRON, RETICCTPCT in the last 72 hours. Urine analysis:    Component Value Date/Time   COLORURINE YELLOW 05/22/2015 [place] 05/22/2015 1545   LABSPEC 1.020 06/15/2015 1708   PHURINE 6.5 06/15/2015 1708   GLUCOSEU 250* 06/15/2015 1708   HGBUR SMALL* 06/15/2015 1708   BILIRUBINUR NEGATIVE 06/15/2015 1708   KETONESUR NEGATIVE 06/15/2015 1708   PROTEINUR NEGATIVE 06/15/2015 1708   UROBILINOGEN 0.2 06/15/2015 1708   NITRITE NEGATIVE 06/15/2015 1708   LEUKOCYTESUR LARGE* 06/15/2015 1708   Sepsis Labs: @LABRCNTIP (procalcitonin:4,lacticidven:4)  ) Recent Results (from the past 240 hour(s))  MRSA PCR  Screening     Status: None   Collection Time: 09/30/15  8:38 PM  Result Value Ref Range Status   MRSA by PCR NEGATIVE NEGATIVE Final    Comment:        The GeneXpert MRSA Assay (FDA approved for NASAL specimens only), is one component of a comprehensive MRSA colonization surveillance program. It is not intended to diagnose MRSA infection nor to guide or monitor treatment for MRSA infections.          Radiology Studies: Dg Chest 2 View  09/30/2015  CLINICAL DATA:  Pain and tightness in the chest tonight with shortness of breath. EXAM: CHEST  2 VIEW COMPARISON:  09/05/2013 FINDINGS: The heart size and mediastinal contours are within normal limits. Both lungs are clear. The visualized skeletal structures are unremarkable. IMPRESSION: No active cardiopulmonary disease. Electronically Signed   By: Lucienne Capers M.D.   On: 09/30/2015 01:30        Scheduled  Meds: . atorvastatin  40 mg Oral q1800  . cholecalciferol  2,000 Units Oral Daily  . clotrimazole   Topical BID  . docusate sodium  100 mg Oral QHS  . enoxaparin (LOVENOX) injection  40 mg Subcutaneous Q24H  . ferrous sulfate  325 mg Oral BID WC  . hydrochlorothiazide  12.5 mg Oral Daily  . insulin aspart  0-15 Units Subcutaneous TID WC  . insulin glargine  20 Units Subcutaneous QHS  . levothyroxine  100 mcg Oral QAC breakfast  . lithium carbonate  600 mg Oral BID  . lurasidone  80 mg Oral QHS  . regadenoson       Continuous Infusions:       Time spent: 35 minutes   Brandice Busser, MD [facility] Pager [phone]  If 7PM-7AM, please contact night-coverage [email or web address] Password [facility] 10/01/2015, 3:14 PM

## 2015-10-01 NOTE — Progress Notes (Signed)
Pt in nuc med - was unable to walk on treadmill. Will follow-up tomorrow after stress test completed.  Sherren Mocha 10/01/2015 11:40 AM

## 2015-10-02 DIAGNOSIS — F429 Obsessive-compulsive disorder, unspecified: Secondary | ICD-10-CM

## 2015-10-02 DIAGNOSIS — F339 Major depressive disorder, recurrent, unspecified: Secondary | ICD-10-CM

## 2015-10-02 LAB — NM MYOCAR MULTI W/SPECT W/WALL MOTION / EF
CHL CUP MPHR: 178 {beats}/min
CHL CUP NUCLEAR SDS: 7
CHL CUP NUCLEAR SRS: 4
CHL CUP NUCLEAR SSS: 11
CSEPHR: 55 %
Estimated workload: 1 METS
Exercise duration (min): 0 min
Exercise duration (sec): 0 s
LV sys vol: 36 mL
LVDIAVOL: 87 mL (ref 46–106)
NUC STRESS TID: 1.07
Peak HR: 99 {beats}/min
RATE: 0.21
RPE: 0
Rest HR: 71 {beats}/min

## 2015-10-02 LAB — GLUCOSE, CAPILLARY: GLUCOSE-CAPILLARY: 154 mg/dL — AB (ref 65–99)

## 2015-10-02 MED ORDER — TECHNETIUM TC 99M SESTAMIBI GENERIC - CARDIOLITE: 30.0000 | Freq: Once | INTRAVENOUS | Status: DC | PRN

## 2015-10-02 NOTE — Discharge Summary (Signed)
Physician Discharge Summary  Lynn Martinez [id number] DOB: August 26, 1970 DOA: 09/30/2015  PCP: Elbert Ewings, FNP  Admit date: 09/30/2015 Discharge date: 10/02/2015  Time spent: 30 minutes  Recommendations for Outpatient Follow-up:  1. F/U with PCP in 1 month.    Discharge Diagnoses:  Principal Problem:   Pain in the chest Active Problems:   Hypothyroidism   ANEMIA-NOS   Major depression, recurrent (HCC)   OCD (obsessive compulsive disorder)   Hypertension   Insulin dependent diabetes mellitus ([place])   Chest pain   Discharge Condition: stable  Diet recommendation: diabetic, heart healthy  Filed Weights   09/30/15 2050 10/01/15 0444 10/02/15 0417  Weight: 127.597 kg (281 lb 4.8 oz) 127.234 kg (280 lb 8 oz) 125.4 kg (276 lb 7.3 oz)    History of present illness:  Lynn Martinez is a 45 y.o. female with medical history significant for insulin-dependent diabetes mellitus, hypertension, hypothyroidism, OCD, and major depressive disorder who presents the emergency department with a "squeezing" pain in the central chest with radiation through to the back. Patient was at rest and seated shortly after midnight on 09/30/2015 when she experienced acute onset of pain in the central chest with radiation through to the back. There was associated headache and dyspnea, but no nausea, diaphoresis, or involvement of the neck, jaw, shoulder, or arm. Patient denies experiencing similar symptoms previously. Symptoms were unrelenting and she came into the ED for evaluation. She did not try any specific interventions prior to her presentation. She had otherwise been in her usual state with no recent illness. Her fianc at the bedside and notes that she is just started a new job and has been under a lot of stress lately surrounding that.   ED Course: Upon arrival to the ED, patient is found to be afebrile, saturating well on room air, with borderline bradycardia and soft blood pressures. Chest  x-ray is negative for acute cardiopulmonary disease and EKG features a sinus rhythm with PR interval of 205 ms. Troponin is 0.01, BMP is notable only for serum glucose of 207, and CBC features a leukocytosis to 12,300, hemoglobin of 10.5, and platelet count of 402,000. Patient was given a sublingual nitroglycerin with no appreciable change in her symptoms. She remained hemodynamically stable in the emergency department with no ischemic changes observed on telemetry. Given her persistent chest pain, the patient will be admitted to the stepdown unit for ongoing evaluation and management of chest pain atypical of, but concerning for, ACS.  Hospital Course:  1. Chest pain - Troponin X3 neg and EKG without acute ischemic features  - SL NTG given x1 in ED without appreciable change in CP  - - ASA held d/t reported allergy  - Risk-stratify with lipid panel and A1c, pending  -Cardiology was consulted considering pt's multiple risk factors. -Underwent lexiscan showed no ischemia  2. Type II DM  - Managed with Lantus 34 units qHS, glipizide 10 mg TID, and Januvia 100 mg qD at home  - Held Januvia and glipizide while admitted  - Continued treatment with reduced-dose Lantus and moderate-intensity SSI correctional, adjust prn  - A1c 9.1. Poorly controlled -Emphasized with the pt regarding the importance of keeping diabetes under control.  3. Hypertension  -- BP was well controlled with HCTZ alone. - - Hold propranolol and prazosin for now given low BP and bradycardia   4. OCD, major depression  - Appears to be stable on admission  - Continue Lithium and Latuda   5. Hypothyroidism  -  Post-surgical  - Continue current-dose Synthroid 100 mcg qD for now - Check TSH and free T4 given atypical CP   6. Anemia, leukocytosis, thrombocytosis  - WBC 12,300; Hgb 10.5; plt 402,000 on admission  - All indices stable relative to prior measurements  - Continue iron supplementation  - No sign  of active infection or blood loss   Procedures:  lexiscan - neg for ischemia  Consultations: cardiology Discharge Exam: Filed Vitals:   10/02/15 0417 10/02/15 0846  BP: 98/59 109/56  Pulse: 94 77  Temp: 98.3 F (36.8 C) 98.7 F (37.1 C)  Resp: 18 18    General: not in distress Cardiovascular:S1,S2 reg Respiratory: B/L clear to auscultation.  Discharge Instructions    Current Discharge Medication List    CONTINUE these medications which have NOT CHANGED   Details  cholecalciferol (VITAMIN D) 1000 UNITS tablet Take 2,000 Units by mouth daily.     clotrimazole-betamethasone (LOTRISONE) cream Apply 1 application topically 2 (two) times daily. To bottoms of both feet Qty: 45 g, Refills: 0   Associated Diagnoses: Tinea pedis of both feet    docusate sodium (COLACE) 100 MG capsule Take 2 capsules (200 mg total) by mouth every morning. For stool softner Qty: 10 capsule    ferrous sulfate 325 (65 FE) MG tablet Take 1 tablet (325 mg total) by mouth daily with breakfast. For low iron    Fiber CHEW Chew 2 tablets by mouth daily.     glipiZIDE (GLUCOTROL) 10 MG tablet Take 10 mg by mouth 3 (three) times daily.     hydrochlorothiazide (MICROZIDE) 12.5 MG capsule Take 12.5 mg by mouth daily.    insulin glargine (LANTUS) 100 UNIT/ML injection Inject 34 Units into the skin at bedtime.     levothyroxine (SYNTHROID, LEVOTHROID) 100 MCG tablet Take 100 mcg by mouth daily before breakfast.    lithium carbonate 300 MG capsule Take 600 mg by mouth 2 (two) times daily.     lurasidone (LATUDA) 80 MG TABS tablet Take 80 mg by mouth at bedtime.    meclizine (ANTIVERT) 25 MG tablet Take 25 mg by mouth 2 (two) times daily as needed for dizziness.     prazosin (MINIPRESS) 1 MG capsule Take 1 capsule (1 mg total) by mouth at bedtime and may repeat dose one time if needed. For sleep/nightmares Qty: 60 capsule, Refills: 0    propranolol (INDERAL) 40 MG tablet Take 40 mg by mouth 2 (two)  times daily.     sitaGLIPtin (JANUVIA) 100 MG tablet Take 100 mg by mouth daily.    tolterodine (DETROL) 2 MG tablet Take 2 mg by mouth 2 (two) times daily.     clotrimazole (LOTRIMIN) 1 % external solution Apply 1 application topically 2 (two) times daily. In between toes Qty: 30 mL, Refills: 0   Associated Diagnoses: Tinea pedis of both feet       Allergies  Allergen Reactions  . Aspirin Hives  . Codeine Itching    Tolerates Hydrocodone OK.  . Ibuprofen Other (See Comments)    Happened in childhood  . Metformin And Related Other (See Comments)    Muscle weakness  . Morphine Other (See Comments)    Throat swelling  . Penicillins Other (See Comments)    Childhood reaction  Has patient had a PCN reaction causing immediate rash, facial/tongue/throat swelling, SOB or lightheadedness with hypotension: unknown Has patient had a PCN reaction causing severe rash involving mucus membranes or skin necrosis: unknown Has patient had a  PCN reaction that required hospitalization unknown Has patient had a PCN reaction occurring within the last 10 years: unknown If all of the above answers are "NO", then may proceed with Cephalosporin use.      The results of significant diagnostics from this hospitalization (including imaging, microbiology, ancillary and laboratory) are listed below for reference.    Significant Diagnostic Studies: Dg Chest 2 View  09/30/2015  CLINICAL DATA:  Pain and tightness in the chest tonight with shortness of breath. EXAM: CHEST  2 VIEW COMPARISON:  09/05/2013 FINDINGS: The heart size and mediastinal contours are within normal limits. Both lungs are clear. The visualized skeletal structures are unremarkable. IMPRESSION: No active cardiopulmonary disease. Electronically Signed   By: Lucienne Capers M.D.   On: 09/30/2015 01:30   Nm Myocar Multi W/spect W/wall Motion / Ef  10/02/2015   There was no ST segment deviation noted during stress.  No T wave inversion was  noted during stress.  This is a low risk study. No ischemia. Normal EF 58%  SKAINS, MARK, MD    Microbiology: Recent Results (from the past 240 hour(s))  MRSA PCR Screening     Status: None   Collection Time: 09/30/15  8:38 PM  Result Value Ref Range Status   MRSA by PCR NEGATIVE NEGATIVE Final    Comment:        The GeneXpert MRSA Assay (FDA approved for NASAL specimens only), is one component of a comprehensive MRSA colonization surveillance program. It is not intended to diagnose MRSA infection nor to guide or monitor treatment for MRSA infections.      Labs: Basic Metabolic Panel:  Recent Labs Lab 09/30/15 0105  NA 136  K 3.6  CL 102  CO2 24  GLUCOSE 207*  BUN 8  CREATININE 0.99  CALCIUM 9.1   Liver Function Tests: No results for input(s): AST, ALT, ALKPHOS, BILITOT, PROT, ALBUMIN in the last 168 hours. No results for input(s): LIPASE, AMYLASE in the last 168 hours. No results for input(s): AMMONIA in the last 168 hours. CBC:  Recent Labs Lab 09/30/15 0105  WBC 12.3*  HGB 10.5*  HCT 34.0*  MCV 85.4  PLT 402*   Cardiac Enzymes:  Recent Labs Lab 09/30/15 0611 09/30/15 1200 09/30/15 1836  TROPONINI <0.03 <0.03 <0.03   BNP: BNP (last 3 results) No results for input(s): BNP in the last 8760 hours.  ProBNP (last 3 results) No results for input(s): PROBNP in the last 8760 hours.  CBG:  Recent Labs Lab 10/01/15 0728 10/01/15 1233 10/01/15 1629 10/01/15 2149 10/02/15 0721  GLUCAP 160* 200* 192* 151* 154*       Signed:  Sheletha Bow MD.  [facility] 10/02/2015, 3:59 PM

## 2015-10-02 NOTE — Progress Notes (Signed)
Patient Name: Lynn Martinez Date of Encounter: 10/02/2015     Principal Problem:   Pain in the chest Active Problems:   Hypothyroidism   ANEMIA-NOS   Major depression, recurrent (HCC)   OCD (obsessive compulsive disorder)   Hypertension   Insulin dependent diabetes mellitus (HCC)   Chest pain    SUBJECTIVE  No further chest pain. Hungry. Waiting for them to take her down to nuc med.   CURRENT MEDS . atorvastatin  40 mg Oral q1800  . cholecalciferol  2,000 Units Oral Daily  . clotrimazole   Topical BID  . docusate sodium  100 mg Oral QHS  . enoxaparin (LOVENOX) injection  40 mg Subcutaneous Q24H  . ferrous sulfate  325 mg Oral BID WC  . hydrochlorothiazide  12.5 mg Oral Daily  . insulin aspart  0-15 Units Subcutaneous TID WC  . insulin glargine  20 Units Subcutaneous QHS  . levothyroxine  100 mcg Oral QAC breakfast  . lithium carbonate  600 mg Oral BID  . lurasidone  80 mg Oral QHS    OBJECTIVE  Filed Vitals:   10/01/15 1530 10/01/15 2007 10/02/15 0417 10/02/15 0846  BP: 118/72 126/85 98/59 109/56  Pulse: 93 93 94 77  Temp: 98.4 F (36.9 C) 99.1 F (37.3 C) 98.3 F (36.8 C) 98.7 F (37.1 C)  TempSrc: Oral Oral Oral Oral  Resp: 18 18 18 18   Height:      Weight:   276 lb 7.3 oz (125.4 kg)   SpO2: 100% 99% 99% 99%    Intake/Output Summary (Last 24 hours) at 10/02/15 1046 Last data filed at 10/02/15 0800  Gross per 24 hour  Intake    960 ml  Output   1000 ml  Net    -40 ml   Filed Weights   09/30/15 2050 10/01/15 0444 10/02/15 0417  Weight: 281 lb 4.8 oz (127.597 kg) 280 lb 8 oz (127.234 kg) 276 lb 7.3 oz (125.4 kg)    PHYSICAL EXAM  General: Pleasant, NAD. obese Neuro: Alert and oriented X 3. Moves all extremities spontaneously. Psych: Normal affect. HEENT:  Normal  Neck: Supple without bruits or JVD. Lungs:  Resp regular and unlabored, CTA. Heart: RRR no s3, s4, or murmurs. Abdomen: Soft, non-tender, non-distended, BS + x 4.    Extremities: No clubbing, cyanosis or edema. DP/PT/Radials 2+ and equal bilaterally.  Accessory Clinical Findings  CBC  Recent Labs  09/30/15 0105  WBC 12.3*  HGB 10.5*  HCT 34.0*  MCV 85.4  PLT [id number]*   Basic Metabolic Panel  Recent Labs  09/30/15 0105  NA 136  K 3.6  CL 102  CO2 24  GLUCOSE 207*  BUN 8  CREATININE 0.99  CALCIUM 9.1   Cardiac Enzymes  Recent Labs  09/30/15 0611 09/30/15 1200 09/30/15 1836  TROPONINI <0.03 <0.03 <0.03   BNP Invalid input(s): POCBNP D-Dimer  Recent Labs  09/30/15 0105  DDIMER 0.33   Hemoglobin A1C  Recent Labs  09/30/15 0611  HGBA1C 9.1*   Fasting Lipid Panel  Recent Labs  09/30/15 0611  CHOL 132  HDL 39*  LDLCALC 71  TRIG 111  CHOLHDL 3.4   Thyroid Function Tests  Recent Labs  09/30/15 0611  TSH 15.201*    TELE  NSR with some sinus tachy  Radiology/Studies  Dg Chest 2 View  09/30/2015  CLINICAL DATA:  Pain and tightness in the chest tonight with shortness of breath. EXAM: CHEST  2 VIEW COMPARISON:  09/05/2013 FINDINGS: The heart size and mediastinal contours are within normal limits. Both lungs are clear. The visualized skeletal structures are unremarkable. IMPRESSION: No active cardiopulmonary disease. Electronically Signed   By: Lucienne Capers M.D.   On: 09/30/2015 01:30    ASSESSMENT AND PLAN Lynn Martinez is a morbidly obese 45 year old African-American female with past medical history of insulin-dependent DM, hypertension, hypothyroidism, bipolar, depression, PCOS, and Graves' disease who presented with prolonged chest pain at rest  1. Prolonged atypical chest pain: with negative troponin and EKG changes - ruled out for MI. Underwent 2 day nuclear stress test due to body habitus. Going down for rest images today. Await result of nuclear stress test.   2. insulin-dependent DM: HgA1c 9.1  3. Hypertension: BP has been well controlled  4. Hypothyroidism: TSH high, but free T4  normal.  Signed, Eileen Stanford PA-C  Pager [phone]  Agree as outlined. Pt had negative stress test. I was not able to see her today prior to discharge.   Sherren Mocha 10/02/2015 6:54 PM

## 2015-10-02 NOTE — Progress Notes (Signed)
Patient discharged home, discharge instructions given no further questions

## 2015-10-02 NOTE — Care Management Note (Signed)
Case Management Note  Patient Details  Name: Lynn Martinez MRN: [id number] Date of Birth: [birth year]  Subjective/Objective: Pt admitted for  Chest Pain. CM did speak with pt in regards to PCP: Elbert Ewings. [facility]                    Action/Plan: Pt will not need any assistance with medications at d/c. No further needs from CM at this time.   Expected Discharge Date:                  Expected Discharge Plan:  Home/Self Care  In-House Referral:  NA  Discharge planning Services  CM Consult  Post Acute Care Choice:  NA Choice offered to:  NA  DME Arranged:  N/A DME Agency:  NA  HH Arranged:  NA HH Agency:  NA  Status of Service:  Completed, signed off  Medicare Important Message Given:    Date Medicare IM Given:    Medicare IM give by:    Date Additional Medicare IM Given:    Additional Medicare Important Message give by:     If discussed at [place] of Stay Meetings, dates discussed:    Additional Comments:  Bethena Roys, RN 10/02/2015, 3:27 PM

## 2016-03-22 ENCOUNTER — Other Ambulatory Visit: Payer: Self-pay | Admitting: Obstetrics and Gynecology

## 2016-03-22 DIAGNOSIS — Z1231 Encounter for screening mammogram for malignant neoplasm of breast: Secondary | ICD-10-CM

## 2016-04-07 ENCOUNTER — Ambulatory Visit
Admission: RE | Admit: 2016-04-07 | Discharge: 2016-04-07 | Disposition: A | Discharge status: Home / Self Care | Payer: No Typology Code available for payment source | Source: Ambulatory Visit | Attending: Obstetrics and Gynecology | Admitting: Obstetrics and Gynecology

## 2016-04-07 ENCOUNTER — Ambulatory Visit (HOSPITAL_COMMUNITY)
Admission: RE | Admit: 2016-04-07 | Discharge: 2016-04-07 | Disposition: A | Discharge status: Home / Self Care | Payer: Self-pay | Source: Ambulatory Visit | Attending: Obstetrics and Gynecology | Admitting: Obstetrics and Gynecology

## 2016-04-07 ENCOUNTER — Other Ambulatory Visit (HOSPITAL_COMMUNITY): Payer: Self-pay | Admitting: *Deleted

## 2016-04-07 ENCOUNTER — Encounter (HOSPITAL_COMMUNITY): Payer: Self-pay

## 2016-04-07 VITALS — BP 114/78 | Ht 67.0 in | Wt 285.8 lb

## 2016-04-07 DIAGNOSIS — N644 Mastodynia: Secondary | ICD-10-CM

## 2016-04-07 DIAGNOSIS — Z1231 Encounter for screening mammogram for malignant neoplasm of breast: Secondary | ICD-10-CM

## 2016-04-07 DIAGNOSIS — Z1239 Encounter for other screening for malignant neoplasm of breast: Secondary | ICD-10-CM

## 2016-04-07 NOTE — Patient Instructions (Signed)
Explained breast self awareness to [employer]. Patient did not need a Pap smear today due to her history of a hysterectomy for benign reasons. Let patient know that she doesn't need any further Pap smears due to her history of a hysterectomy for benign reasons. Referred patient to the [facility] for diagnostic mammogram and left breast ultrasound. Appointment scheduled for Thursday, April 07, 2016 at 1415. Robb Matar verbalized understanding.  Laketra Bowdish, Arvil Chaco, RN 2:32 PM

## 2016-04-07 NOTE — Progress Notes (Signed)
Complaints of left breast lump and discolored area x 1 month that is painful. Patient states pain comes and goes and is more painful when touched. Patient referred to [facility] by [facility] and was given an antibiotic Batrim with no improvement.    Pap Smear: Pap smear not completed today. Last Pap smear was in 2012 and normal per patient. Per patient has no history of an abnormal Pap smear. Patient has a history of a hysterectomy 10/11/2011 due to fibroids. Patient no longer needs Pap smears due to her history of a hysterectomy for benign reasons per BCCCP and ACOG guidelines. No Pap smear results are in EPIC.  Physical exam: Breasts Breasts symmetrical. No skin abnormalities right breast. Observed a discolored area left breast at 3 o'clock 6 cm from the nipple. No nipple retraction bilateral breasts. No nipple discharge bilateral breasts. No lymphadenopathy. No lumps palpated right breast. Palpated a superficial thickened area under discolored area on left breast. Complaints of pain when palpated discolored area. Referred patient to the [facility] for diagnostic mammogram and left breast ultrasound. Appointment scheduled for Thursday, April 07, 2016 at 1415.        Pelvic/Bimanual No Pap smear completed today since patient has a history of a hysterectomy for benign reasons. Pap smear not indicated per BCCCP guidelines.   Smoking History: Patient has never smoked.  Patient Navigation: Patient education provided. Access to services provided for patient through [facility] program.

## 2016-04-11 ENCOUNTER — Encounter (HOSPITAL_COMMUNITY): Payer: Self-pay | Admitting: *Deleted

## 2016-07-31 ENCOUNTER — Emergency Department (HOSPITAL_COMMUNITY)
Admission: EM | Admit: 2016-07-31 | Discharge: 2016-07-31 | Disposition: A | Discharge status: Home / Self Care | Payer: No Typology Code available for payment source | Attending: Emergency Medicine | Admitting: Emergency Medicine

## 2016-07-31 ENCOUNTER — Emergency Department (HOSPITAL_COMMUNITY): Payer: No Typology Code available for payment source

## 2016-07-31 ENCOUNTER — Encounter (HOSPITAL_COMMUNITY): Payer: Self-pay

## 2016-07-31 DIAGNOSIS — Z794 Long term (current) use of insulin: Secondary | ICD-10-CM | POA: Insufficient documentation

## 2016-07-31 DIAGNOSIS — I10 Essential (primary) hypertension: Secondary | ICD-10-CM | POA: Insufficient documentation

## 2016-07-31 DIAGNOSIS — M722 Plantar fascial fibromatosis: Secondary | ICD-10-CM | POA: Insufficient documentation

## 2016-07-31 DIAGNOSIS — E039 Hypothyroidism, unspecified: Secondary | ICD-10-CM | POA: Insufficient documentation

## 2016-07-31 DIAGNOSIS — Z85850 Personal history of malignant neoplasm of thyroid: Secondary | ICD-10-CM | POA: Insufficient documentation

## 2016-07-31 DIAGNOSIS — E119 Type 2 diabetes mellitus without complications: Secondary | ICD-10-CM | POA: Insufficient documentation

## 2016-07-31 DIAGNOSIS — Z79899 Other long term (current) drug therapy: Secondary | ICD-10-CM | POA: Insufficient documentation

## 2016-07-31 NOTE — Discharge Instructions (Signed)
Take tylenol for pain. Get a good arch support in your shoe and follow up with your doctor. Return here as needed.

## 2016-07-31 NOTE — ED Notes (Signed)
Hope, NP at bedside.  

## 2016-07-31 NOTE — ED Triage Notes (Signed)
Patient complains of right foot pain x 2 days, states that she thinks related to all the standing at work, denies any other trauma.

## 2016-07-31 NOTE — ED Provider Notes (Signed)
[facility] DEPT Provider Note    By signing my name below, I, Bea Graff, attest that this documentation has been prepared under the direction and in the presence of [facility], [place]. Electronically Signed: Bea Graff, ED Scribe. 07/31/16. 5:28 PM.  4:57 PM- Attempted to see pt but was in X-Ray at this time.   History   Chief Complaint No chief complaint on file.  The history is provided by the patient and medical records. No language interpreter was used.    Lynn Martinez is an obese 46 y.o. female with PMHx of HTN, T2DM, hypothyroidism, bipolar disorder and depression who presents to the Emergency Department complaining of moderate right foot pain to the medial plantar aspect that began two days ago. She reports associated radiating pain that goes back to her right heel. Pt states she stands and walks for long periods of time at her job which she just recently started. She has not taken anything for pain but has soaked it in epsom salt and rubbed Aspercreme on it with minimal relief. She denies modifying factors. She denies numbness, tingling or weakness of the right foot or RLE, bruising, wounds, trauma, injury or fall. Her PCP is Dr. Elbert Ewings. She states she started taking insulin for her DM in the past two months. She takes Lantus 70u daily. She is also on oral medications for DM.   Past Medical History:  Diagnosis Date  . Anemia   . Anxiety   . Arthritis    "knees" (09/30/2015)  . Bipolar disorder ([place])   . Depression   . Fibroid    s/p myomectomy 2010, hysterectomy 2013  . Grave's disease   . Heart murmur   . Hormone disorder   . Hypertension   . Hypothyroidism   . 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 Migraine     otc med prn  . PCOS (polycystic ovarian syndrome)   . PMS (premenstrual syndrome)   . PTSD (post-traumatic stress disorder)   . Seasonal allergies   . Thyroid cancer ([place]) 2005  . Type II diabetes  mellitus ([place])   . Vertigo     Patient Active Problem List   Diagnosis Date Noted  . Pain in the chest 09/30/2015  . Hypertension 09/30/2015  . Insulin dependent diabetes mellitus ([place]) 09/30/2015  . Chest pain 09/30/2015  . Cholelithiasis with acute cholecystitis 09/05/2013  . Borderline behavior 03/14/2012    Class: Chronic  . OCD (obsessive compulsive disorder) 03/14/2012    Class: Chronic  . PTSD (post-traumatic stress disorder) 03/14/2012    Class: Chronic  . 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 Lumbago without sciatica 03/12/2012  . Major depression, recurrent ([place]) 03/08/2012    Class: Chronic  . Snoring 12/02/2011  . PCOS (polycystic ovarian syndrome) 09/05/2011  . Hypothyroidism 08/13/2010  . VITAMIN D DEFICIENCY 08/13/2010  . ANEMIA-NOS 08/13/2010    Past Surgical History:  Procedure Laterality Date  . ABDOMINAL HYSTERECTOMY  10/11/2011   Procedure: HYSTERECTOMY ABDOMINAL;  Surgeon: Terrance Mass, MD;  Location: [facility];  Service: Gynecology;  Laterality: N/A;  With Repair of serosa.  . CHOLECYSTECTOMY N/A 09/06/2013   Procedure: LAPAROSCOPIC CHOLECYSTECTOMY WITH INTRAOPERATIVE CHOLANGIOGRAM;  Surgeon: Merrie Roof, MD;  Location: [facility];  Service: General;  Laterality: N/A;  . DIAGNOSTIC LAPAROSCOPY    . DILATION AND CURETTAGE OF UTERUS    . HERNIA REPAIR    .  MYOMECTOMY  2010   LAPAROSCOPY  . TOTAL THYROIDECTOMY  2006  . UMBILICAL HERNIA REPAIR  1982    OB History    Gravida Para Term Preterm AB Living   2 0     2 0   SAB TAB Ectopic Multiple Live Births   2               Home Medications    Prior to Admission medications   Medication Sig Start Date End Date Taking? Authorizing Provider  cholecalciferol (VITAMIN D) 1000 UNITS tablet Take 2,000 Units by mouth daily.  03/14/12   Darrol Jump, MD  clotrimazole (LOTRIMIN) 1 % external solution Apply 1 application topically 2 (two) times daily. In between toes  09/14/15   Landis Martins, DPM  clotrimazole-betamethasone (LOTRISONE) cream Apply 1 application topically 2 (two) times daily. To bottoms of both feet Patient not taking: Reported on 04/07/2016 09/14/15   Landis Martins, DPM  docusate sodium (COLACE) 100 MG capsule Take 2 capsules (200 mg total) by mouth every morning. For stool softner Patient taking differently: Take 100 mg by mouth at bedtime. For stool softner 03/14/12   Darrol Jump, MD  ferrous sulfate 325 (65 FE) MG tablet Take 1 tablet (325 mg total) by mouth daily with breakfast. For low iron Patient taking differently: Take 325 mg by mouth 2 (two) times daily with a meal. For low iron 03/14/12   Darrol Jump, MD  Fiber CHEW Chew 2 tablets by mouth daily.     Historical Provider, MD  glipiZIDE (GLUCOTROL) 10 MG tablet Take 10 mg by mouth 3 (three) times daily.     Historical Provider, MD  hydrochlorothiazide (MICROZIDE) 12.5 MG capsule Take 12.5 mg by mouth daily.    Historical Provider, MD  insulin glargine (LANTUS) 100 UNIT/ML injection Inject 60 Units into the skin at bedtime.     Historical Provider, MD  levothyroxine (SYNTHROID, LEVOTHROID) 100 MCG tablet Take 112 mcg by mouth daily before breakfast.     Historical Provider, MD  lithium carbonate 300 MG capsule Take 600 mg by mouth 2 (two) times daily.     Historical Provider, MD  lurasidone (LATUDA) 80 MG TABS tablet Take 80 mg by mouth at bedtime.    Historical Provider, MD  meclizine (ANTIVERT) 25 MG tablet Take 25 mg by mouth 2 (two) times daily as needed for dizziness.     Historical Provider, MD  prazosin (MINIPRESS) 1 MG capsule Take 1 capsule (1 mg total) by mouth at bedtime and may repeat dose one time if needed. For sleep/nightmares 03/14/12   Darrol Jump, MD  propranolol (INDERAL) 40 MG tablet Take 40 mg by mouth 2 (two) times daily.     Historical Provider, MD  sitaGLIPtin (JANUVIA) 100 MG tablet Take 100 mg by mouth daily.    Historical Provider, MD  tolterodine (DETROL)  2 MG tablet Take 2 mg by mouth 2 (two) times daily.     Historical Provider, MD    Family History Family History  Problem Relation Age of Onset  . Thyroid disease Mother   . Diabetes Father   . Hypertension Father   . Heart disease Father   . Heart attack Father   . Cancer Maternal Aunt     BRAIN  . Cancer Maternal Grandmother     LYMPHOMA  . Hypertension Maternal Grandmother   . Cancer Paternal Grandmother     PANCREATIC  . Diabetes Paternal Grandmother   . Hypertension  Paternal Grandmother   . Hypertension Maternal Grandfather   . Hypertension Paternal Grandfather     Social History Social History  Substance Use Topics  . Smoking status: Never Smoker  . Smokeless tobacco: Never Used  . Alcohol use No     Allergies   Aspirin; Codeine; Ibuprofen; Metformin and related; Morphine; and Penicillins   Review of Systems Review of Systems  Musculoskeletal: Positive for arthralgias.       Right foot pain  Skin: Negative for wound.     Physical Exam Updated Vital Signs BP 122/69   Pulse 68   Temp 99 F (37.2 C) (Oral)   Resp 18   LMP 09/20/2011   SpO2 99%   Physical Exam  Constitutional: She appears well-developed and well-nourished. No distress.  HENT:  Head: Normocephalic.  Eyes: EOM are normal.  Neck: Neck supple.  Cardiovascular: Normal rate.   Dorsalis Pedis pulses 2+ bilaterally. Adequate circulation.  Pulmonary/Chest: Effort normal.  Abdominal: Soft. There is no tenderness.  Musculoskeletal: Normal range of motion. She exhibits no deformity.  Tender with palpation to the right heel and plantar aspect of the right foot. No open wounds.   Neurological: She is alert.  Equal strength in BLE.  Skin: Skin is warm and dry.  No open wounds to right foot.  Psychiatric: She has a normal mood and affect.  Nursing note and vitals reviewed.    ED Treatments / Results  DIAGNOSTIC STUDIES: Oxygen Saturation is 99% on RA, normal by my interpretation.    COORDINATION OF CARE: 5:26 PM- Will provide ace wrap. Encouraged pt to wear shoes with good arch support. Pt verbalizes understanding and agrees to plan.  Medications - No data to display  Labs (all labs ordered are listed, but only abnormal results are displayed) Labs Reviewed - No data to display  Procedures Procedures (including critical care time)  Medications Ordered in ED Medications - No data to display   Initial Impression / Assessment and Plan / ED Course  I have reviewed the triage vital signs and the nursing notes.  *Patient here for right foot pain on the medial plantar aspect that began two days ago. X-Ray negative for obvious fracture or dislocation. Pt advised to get better arch support. Patient given ace wrap while in ED, conservative therapy recommended and discussed. Patient will be discharged home & is agreeable with above plan. Returns precautions discussed. Pt appears safe for discharge. I personally performed the services described in this documentation, which was scribed in my presence. The recorded information has been reviewed and is accurate.   Final Clinical Impressions(s) / ED Diagnoses   Final diagnoses:  Plantar fasciitis    New Prescriptions Discharge Medication List as of 07/31/2016  5:29 PM       Ashley Murrain, NP 08/03/16 [id number]    Pattricia Boss, MD 08/08/16 1444

## 2016-09-12 ENCOUNTER — Encounter (HOSPITAL_COMMUNITY): Payer: Self-pay | Admitting: Emergency Medicine

## 2016-09-12 DIAGNOSIS — Z79899 Other long term (current) drug therapy: Secondary | ICD-10-CM | POA: Insufficient documentation

## 2016-09-12 DIAGNOSIS — Z85850 Personal history of malignant neoplasm of thyroid: Secondary | ICD-10-CM | POA: Insufficient documentation

## 2016-09-12 DIAGNOSIS — E039 Hypothyroidism, unspecified: Secondary | ICD-10-CM | POA: Insufficient documentation

## 2016-09-12 DIAGNOSIS — J029 Acute pharyngitis, unspecified: Secondary | ICD-10-CM | POA: Insufficient documentation

## 2016-09-12 DIAGNOSIS — E119 Type 2 diabetes mellitus without complications: Secondary | ICD-10-CM | POA: Insufficient documentation

## 2016-09-12 DIAGNOSIS — Z794 Long term (current) use of insulin: Secondary | ICD-10-CM | POA: Insufficient documentation

## 2016-09-12 DIAGNOSIS — B349 Viral infection, unspecified: Secondary | ICD-10-CM | POA: Insufficient documentation

## 2016-09-12 DIAGNOSIS — I10 Essential (primary) hypertension: Secondary | ICD-10-CM | POA: Insufficient documentation

## 2016-09-12 LAB — CBC
HCT: 35.4 % — ABNORMAL LOW (ref 36.0–46.0)
Hemoglobin: 11.2 g/dL — ABNORMAL LOW (ref 12.0–15.0)
MCH: 26 pg (ref 26.0–34.0)
MCHC: 31.6 g/dL (ref 30.0–36.0)
MCV: 82.1 fL (ref 78.0–100.0)
PLATELETS: 462 10*3/uL — AB (ref 150–400)
RBC: 4.31 MIL/uL (ref 3.87–5.11)
RDW: 15.6 % — ABNORMAL HIGH (ref 11.5–15.5)
WBC: 11.6 10*3/uL — ABNORMAL HIGH (ref 4.0–10.5)

## 2016-09-12 LAB — COMPREHENSIVE METABOLIC PANEL
ALK PHOS: 57 U/L (ref 38–126)
ALT: 17 U/L (ref 14–54)
AST: 14 U/L — ABNORMAL LOW (ref 15–41)
Albumin: 3.9 g/dL (ref 3.5–5.0)
Anion gap: 6 (ref 5–15)
BILIRUBIN TOTAL: 0.3 mg/dL (ref 0.3–1.2)
BUN: 10 mg/dL (ref 6–20)
CO2: 29 mmol/L (ref 22–32)
CREATININE: 0.99 mg/dL (ref 0.44–1.00)
Calcium: 9.5 mg/dL (ref 8.9–10.3)
Chloride: 105 mmol/L (ref 101–111)
Glucose, Bld: 97 mg/dL (ref 65–99)
Potassium: 3.5 mmol/L (ref 3.5–5.1)
SODIUM: 140 mmol/L (ref 135–145)
TOTAL PROTEIN: 8 g/dL (ref 6.5–8.1)

## 2016-09-12 LAB — LIPASE, BLOOD: Lipase: 17 U/L (ref 11–51)

## 2016-09-12 NOTE — ED Triage Notes (Signed)
Pt is c/o headache, throat swelling, nausea, slight fever,  Sxs started yesterday and have progressively gotten worse  Pt states today she started throwing up blood  Pt states she has had two episodes of that today

## 2016-09-13 ENCOUNTER — Emergency Department (HOSPITAL_COMMUNITY): Payer: Self-pay

## 2016-09-13 ENCOUNTER — Emergency Department (HOSPITAL_COMMUNITY)
Admission: EM | Admit: 2016-09-13 | Discharge: 2016-09-13 | Disposition: A | Discharge status: Home / Self Care | Payer: Self-pay | Attending: Emergency Medicine | Admitting: Emergency Medicine

## 2016-09-13 DIAGNOSIS — B349 Viral infection, unspecified: Secondary | ICD-10-CM

## 2016-09-13 DIAGNOSIS — J029 Acute pharyngitis, unspecified: Secondary | ICD-10-CM

## 2016-09-13 LAB — URINALYSIS, ROUTINE W REFLEX MICROSCOPIC
BILIRUBIN URINE: NEGATIVE
Glucose, UA: NEGATIVE mg/dL
HGB URINE DIPSTICK: NEGATIVE
KETONES UR: NEGATIVE mg/dL
Leukocytes, UA: NEGATIVE
Nitrite: NEGATIVE
Protein, ur: NEGATIVE mg/dL
SPECIFIC GRAVITY, URINE: 1.019 (ref 1.005–1.030)
pH: 6 (ref 5.0–8.0)

## 2016-09-13 LAB — RAPID STREP SCREEN (MED CTR MEBANE ONLY): STREPTOCOCCUS, GROUP A SCREEN (DIRECT): NEGATIVE

## 2016-09-13 LAB — PREGNANCY, URINE: Preg Test, Ur: NEGATIVE

## 2016-09-13 MED ORDER — DIPHENHYDRAMINE HCL 50 MG/ML IJ SOLN
25.0000 mg | Freq: Once | INTRAMUSCULAR | Status: AC
  Administered 2016-09-13: 25 mg via INTRAMUSCULAR
  Filled 2016-09-13: qty 1

## 2016-09-13 MED ORDER — ONDANSETRON 4 MG PO TBDP
4.0000 mg | ORAL_TABLET | Freq: Once | ORAL | Status: AC | PRN
  Administered 2016-09-13: 4 mg via ORAL
  Filled 2016-09-13: qty 1

## 2016-09-13 MED ORDER — METOCLOPRAMIDE HCL 5 MG/ML IJ SOLN
10.0000 mg | Freq: Once | INTRAMUSCULAR | Status: AC
  Administered 2016-09-13: 10 mg via INTRAMUSCULAR
  Filled 2016-09-13: qty 2

## 2016-09-13 NOTE — ED Provider Notes (Signed)
[facility] DEPT Provider Note   CSN: [id number] Arrival date & time: 09/12/16  2200   By signing my name below, I, Lynn Martinez, attest that this documentation has been prepared under the direction and in the presence of Lynn Essex, MD. Electronically signed, Lynn Martinez, ED Scribe. 09/13/16. 1:11 AM.   History   Chief Complaint Chief Complaint  Patient presents with  . Hematemesis   HPI  HPI Comments: Lynn Martinez is a 46 y.o. female with Hx of migraines who presents to the Emergency Department complaining of gradually worsening central headache x > 2 days. Pt notes associated gradually worsening post tussive vomiting x 2 today with food, medicine and some specs of red; 8/10 "fog, vice grip" headache; rhinorrhea; 8/10 abdominal pain; throat pain and swelling; productive cough with clear sputum; nausea; chills and subjective low grade fever. She states she has taken zicam, acetaminophen 500 mg and robitussin with no relief to her symptoms. Pt given reportedly Zofran prior to evaluation with minimal relief to nausea. Pt on synthroid. Thyroid, uterus and gallbladder reportedly removed. She states she has been vaccinated for flu. Pt denies body aches, visual changes, photophobia, phonophobia, sinus pain, diarrhea, constipation, bloody stool, dysuria, dizziness, lightheadedness, SOB, chest pain, hematuria, birth control use and Hx of alcohol use, GERD or abdominal disorders. No sick contacts noted.  Past Medical History:  Diagnosis Date  . Anemia   . Anxiety   . Arthritis    "knees" (09/30/2015)  . Bipolar disorder ([place])   . Depression   . Fibroid    s/p myomectomy 2010, hysterectomy 2013  . Grave's disease   . Heart murmur   . Hormone disorder   . Hypertension   . Hypothyroidism   . 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 Migraine     otc med prn  . PCOS (polycystic ovarian syndrome)   . PMS (premenstrual syndrome)   . PTSD  (post-traumatic stress disorder)   . Seasonal allergies   . Thyroid cancer ([place]) 2005  . Type II diabetes mellitus ([place])   . Vertigo     Patient Active Problem List   Diagnosis Date Noted  . Pain in the chest 09/30/2015  . Hypertension 09/30/2015  . Insulin dependent diabetes mellitus ([place]) 09/30/2015  . Chest pain 09/30/2015  . Cholelithiasis with acute cholecystitis 09/05/2013  . Borderline behavior 03/14/2012    Class: Chronic  . OCD (obsessive compulsive disorder) 03/14/2012    Class: Chronic  . PTSD (post-traumatic stress disorder) 03/14/2012    Class: Chronic  . 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 Lumbago without sciatica 03/12/2012  . Major depression, recurrent ([place]) 03/08/2012    Class: Chronic  . Snoring 12/02/2011  . PCOS (polycystic ovarian syndrome) 09/05/2011  . Hypothyroidism 08/13/2010  . VITAMIN D DEFICIENCY 08/13/2010  . ANEMIA-NOS 08/13/2010    Past Surgical History:  Procedure Laterality Date  . ABDOMINAL HYSTERECTOMY  10/11/2011   Procedure: HYSTERECTOMY ABDOMINAL;  Surgeon: Terrance Mass, MD;  Location: [facility];  Service: Gynecology;  Laterality: N/A;  With Repair of serosa.  . CHOLECYSTECTOMY N/A 09/06/2013   Procedure: LAPAROSCOPIC CHOLECYSTECTOMY WITH INTRAOPERATIVE CHOLANGIOGRAM;  Surgeon: Merrie Roof, MD;  Location: [facility];  Service: General;  Laterality: N/A;  . DIAGNOSTIC LAPAROSCOPY    . DILATION AND CURETTAGE OF UTERUS    . HERNIA REPAIR    . MYOMECTOMY  2010   LAPAROSCOPY  .  TOTAL THYROIDECTOMY  2006  . UMBILICAL HERNIA REPAIR  1982    OB History    Gravida Para Term Preterm AB Living   2 0     2 0   SAB TAB Ectopic Multiple Live Births   2               Home Medications    Prior to Admission medications   Medication Sig Start Date End Date Taking? Authorizing Provider  cholecalciferol (VITAMIN D) 1000 UNITS tablet Take 2,000 Units by mouth daily.  03/14/12   Darrol Jump, MD    clotrimazole (LOTRIMIN) 1 % external solution Apply 1 application topically 2 (two) times daily. In between toes 09/14/15   Landis Martins, DPM  clotrimazole-betamethasone (LOTRISONE) cream Apply 1 application topically 2 (two) times daily. To bottoms of both feet Patient not taking: Reported on 04/07/2016 09/14/15   Landis Martins, DPM  docusate sodium (COLACE) 100 MG capsule Take 2 capsules (200 mg total) by mouth every morning. For stool softner Patient taking differently: Take 100 mg by mouth at bedtime. For stool softner 03/14/12   Darrol Jump, MD  ferrous sulfate 325 (65 FE) MG tablet Take 1 tablet (325 mg total) by mouth daily with breakfast. For low iron Patient taking differently: Take 325 mg by mouth 2 (two) times daily with a meal. For low iron 03/14/12   Darrol Jump, MD  Fiber CHEW Chew 2 tablets by mouth daily.     Historical Provider, MD  glipiZIDE (GLUCOTROL) 10 MG tablet Take 10 mg by mouth 3 (three) times daily.     Historical Provider, MD  hydrochlorothiazide (MICROZIDE) 12.5 MG capsule Take 12.5 mg by mouth daily.    Historical Provider, MD  insulin glargine (LANTUS) 100 UNIT/ML injection Inject 60 Units into the skin at bedtime.     Historical Provider, MD  levothyroxine (SYNTHROID, LEVOTHROID) 100 MCG tablet Take 112 mcg by mouth daily before breakfast.     Historical Provider, MD  lithium carbonate 300 MG capsule Take 600 mg by mouth 2 (two) times daily.     Historical Provider, MD  lurasidone (LATUDA) 80 MG TABS tablet Take 80 mg by mouth at bedtime.    Historical Provider, MD  meclizine (ANTIVERT) 25 MG tablet Take 25 mg by mouth 2 (two) times daily as needed for dizziness.     Historical Provider, MD  prazosin (MINIPRESS) 1 MG capsule Take 1 capsule (1 mg total) by mouth at bedtime and may repeat dose one time if needed. For sleep/nightmares 03/14/12   Darrol Jump, MD  propranolol (INDERAL) 40 MG tablet Take 40 mg by mouth 2 (two) times daily.     Historical Provider, MD   sitaGLIPtin (JANUVIA) 100 MG tablet Take 100 mg by mouth daily.    Historical Provider, MD  tolterodine (DETROL) 2 MG tablet Take 2 mg by mouth 2 (two) times daily.     Historical Provider, MD    Family History Family History  Problem Relation Age of Onset  . Thyroid disease Mother   . Diabetes Father   . Hypertension Father   . Heart disease Father   . Heart attack Father   . Cancer Maternal Aunt     BRAIN  . Cancer Maternal Grandmother     LYMPHOMA  . Hypertension Maternal Grandmother   . Cancer Paternal Grandmother     PANCREATIC  . Diabetes Paternal Grandmother   . Hypertension Paternal Grandmother   . Hypertension Maternal  Grandfather   . Hypertension Paternal Grandfather     Social History Social History  Substance Use Topics  . Smoking status: Never Smoker  . Smokeless tobacco: Never Used  . Alcohol use No     Allergies   Aspirin; Codeine; Ibuprofen; Metformin and related; Morphine; and Penicillins   Review of Systems Review of Systems  Constitutional: Positive for chills and fever.  HENT: Positive for rhinorrhea and sore throat. Negative for sinus pain.   Eyes: Negative for photophobia and visual disturbance.  Respiratory: Positive for cough. Negative for shortness of breath.   Cardiovascular: Negative for chest pain.  Gastrointestinal: Positive for abdominal pain, nausea and vomiting (with some specs of blood). Negative for blood in stool, constipation and diarrhea.  Genitourinary: Negative for dysuria and hematuria.  Musculoskeletal: Negative for arthralgias, myalgias and neck pain.  Neurological: Positive for headaches. Negative for dizziness and light-headedness.  All other systems reviewed and are negative.  Physical Exam Updated Vital Signs BP 139/67 (BP Location: Left Arm)   Pulse 77   Temp 98.5 F (36.9 C) (Oral)   Resp 18   LMP 09/20/2011   SpO2 97%   Physical Exam  Constitutional: She is oriented to person, place, and time. She appears  well-developed and well-nourished. No distress.  HENT:  Head: Normocephalic and atraumatic.  Mouth/Throat: Oropharynx is clear and moist. No oropharyngeal exudate, posterior oropharyngeal edema or posterior oropharyngeal erythema. No tonsillar exudate.  Eyes: Conjunctivae and EOM are normal. Pupils are equal, round, and reactive to light.  Neck: Normal range of motion. Neck supple.  No meningismus.  Cardiovascular: Normal rate, regular rhythm, normal heart sounds and intact distal pulses.   No murmur heard. Pulmonary/Chest: Effort normal and breath sounds normal. No respiratory distress.  Abdominal: Soft. There is no tenderness. There is no rebound and no guarding.  Musculoskeletal: Normal range of motion. She exhibits no edema or tenderness.  Neurological: She is alert and oriented to person, place, and time. No cranial nerve deficit. She exhibits normal muscle tone. Coordination normal.   5/5 strength throughout. CN 2-12 intact.Equal grip strength.   Skin: Skin is warm.  Psychiatric: She has a normal mood and affect. Her behavior is normal.  Nursing note and vitals reviewed.   ED Treatments / Results  DIAGNOSTIC STUDIES: Oxygen Saturation is 97% on RA, normal by my interpretation.    COORDINATION OF CARE: 12:41 AM Discussed treatment plan with pt at bedside and pt agreed to plan. Will order medication and labs  Labs (all labs ordered are listed, but only abnormal results are displayed) Labs Reviewed  COMPREHENSIVE METABOLIC PANEL - Abnormal; Notable for the following:       Result Value   AST 14 (*)    All other components within normal limits  CBC - Abnormal; Notable for the following:    WBC 11.6 (*)    Hemoglobin 11.2 (*)    HCT 35.4 (*)    RDW 15.6 (*)    Platelets 462 (*)    All other components within normal limits  URINALYSIS, ROUTINE W REFLEX MICROSCOPIC - Abnormal; Notable for the following:    APPearance HAZY (*)    All other components within normal limits    RAPID STREP SCREEN (NOT AT [facility])  LIPASE, BLOOD  PREGNANCY, URINE    EKG  EKG Interpretation None       Radiology Dg Chest 2 View  Result Date: 09/13/2016 CLINICAL DATA:  Cough and fever for 72 hours. EXAM: CHEST  2  VIEW COMPARISON:  09/30/2015 FINDINGS: Shallow inspiration. No airspace consolidation. No effusion. Normal pulmonary vasculature. IMPRESSION: No consolidation or effusion. Electronically Signed   By: Andreas Newport M.D.   On: 09/13/2016 01:18    Procedures Procedures (including critical care time)  Medications Ordered in ED Medications  ondansetron (ZOFRAN-ODT) disintegrating tablet 4 mg (4 mg Oral Given 09/13/16 0031)  metoCLOPramide (REGLAN) injection 10 mg (10 mg Intramuscular Given 09/13/16 0107)  diphenhydrAMINE (BENADRYL) injection 25 mg (25 mg Intramuscular Given 09/13/16 0108)     Initial Impression / Assessment and Plan / ED Course  I have reviewed the triage vital signs and the nursing notes.  Pertinent labs & imaging results that were available during my care of the patient were reviewed by me and considered in my medical decision making (see chart for details).     2 day history of gradual onset headache, sore throat, nausea, subjective fever. 2 episodes of posttussive emesis with some specs of blood. Denies diarrhea.  Oropharynx appears normal. No distress. Chest x-rays negative.  Labs are reassuring. Rapid strep is negative. Chest x-rays negative. Patient is tolerating by mouth without difficulty. Her headache is improved after Reglan and Benadryl. Suspect viral syndrome. Doubt active GI bleed and specs of blood she saw her emesis. Vitals are stable. Her hemoglobin is stable.  Follow up with PCP. Return precautions discussed.  Final Clinical Impressions(s) / ED Diagnoses   Final diagnoses:  Pharyngitis, unspecified etiology  Viral syndrome    New Prescriptions New Prescriptions   No medications on file   I personally performed the  services described in this documentation, which was scribed in my presence. The recorded information has been reviewed and is accurate.    Lynn Essex, MD 09/13/16 1901

## 2016-09-13 NOTE — Discharge Instructions (Signed)
Keep yourself hydrated.  Follow-up with your doctor.  Return to the ED if you develop new or worsening symptoms. °

## 2016-09-13 NOTE — ED Notes (Signed)
Pt tolerating fluids well w/o difficulty. Pt denies nausea. No episode of emesis.

## 2016-09-15 LAB — CULTURE, GROUP A STREP (THRC)

## 2016-12-15 ENCOUNTER — Ambulatory Visit (HOSPITAL_COMMUNITY)
Admission: EM | Admit: 2016-12-15 | Discharge: 2016-12-15 | Disposition: A | Discharge status: Home / Self Care | Payer: No Typology Code available for payment source | Attending: Family Medicine | Admitting: Family Medicine

## 2016-12-15 ENCOUNTER — Encounter (HOSPITAL_COMMUNITY): Payer: Self-pay | Admitting: Family Medicine

## 2016-12-15 DIAGNOSIS — R1031 Right lower quadrant pain: Secondary | ICD-10-CM

## 2016-12-15 LAB — POCT URINALYSIS DIP (DEVICE)
BILIRUBIN URINE: NEGATIVE
Glucose, UA: NEGATIVE mg/dL
HGB URINE DIPSTICK: NEGATIVE
KETONES UR: NEGATIVE mg/dL
LEUKOCYTES UA: NEGATIVE
NITRITE: NEGATIVE
PH: 6.5 (ref 5.0–8.0)
Protein, ur: NEGATIVE mg/dL
Specific Gravity, Urine: 1.015 (ref 1.005–1.030)
Urobilinogen, UA: 0.2 mg/dL (ref 0.0–1.0)

## 2016-12-15 NOTE — ED Triage Notes (Signed)
Pt here with sharp, stabbing RLQ pain worsening over 3 days. sts that she has been having urinary burning and retention.

## 2016-12-15 NOTE — ED Provider Notes (Signed)
[facility]   [id number] 12/15/16 Arrival Time: 12  ASSESSMENT & PLAN:  Today you were diagnosed with the following: 1. Abdominal discomfort in right lower quadrant    You have not been prescribed prescription medications this visit.  At the time of your visit I do not have reason to suspect your abdominal discomfort is anything dangerous. As discussed, please call your primary care physician to arrange follow up and further evaluation. Please ensure adequate fluid intake. You may take Tylenol occasionally if needed.  Your urinalysis was completely normal today.  If you are not improving over the next few days or feel you are worsening please follow up here or the Emergency Department if you are unable to see your regular doctor.  Reviewed expectations re: course of current medical issues. Questions answered. Outlined signs and symptoms indicating need for more acute intervention. Patient verbalized understanding. After Visit Summary given.   SUBJECTIVE:  Lynn Martinez is a 46 y.o. female who presents with complaint of abdominal discomfort for 2-3 days. On/off. Describes as "stabbing". Mainly RLQ. No fever. Questions urinary frequency and mild dysuria. No hematuria. Normal PO intake. No flank pain. No n/v/d. No recent travel. No self treatment.  H/O hysterectomy for fibroids. Still has ovaries.  ROS: As per HPI.   OBJECTIVE:  Vitals:   12/15/16 1059  BP: 134/85  Pulse: 65  Resp: 18  Temp: 98.3 F (36.8 C)  TempSrc: Oral  SpO2: 98%     General appearance: alert; no distress Head: normocephalic; atraumatic Throat: lips, mucosa, and tongue normal; teeth and gums normal Neck: supple; symmetrical; trachea midline Lungs: clear to auscultation bilaterally Heart: regular rate and rhythm Abdomen: soft, mild tenderness over lower abdomen that is poorly localized; bowel sounds normal; no masses or organomegaly; no guarding or rebound tenderness Back:  ROM  normal; no CVA tenderness Extremities: extremities normal, atraumatic, no cyanosis or edema MSK: symmetrical with no gross deformities Skin: warm and dry; no rashes or lesions   Results for orders placed or performed during the hospital encounter of 12/15/16  POCT urinalysis dip (device)  Result Value Ref Range   Glucose, UA NEGATIVE NEGATIVE mg/dL   Bilirubin Urine NEGATIVE NEGATIVE   Ketones, ur NEGATIVE NEGATIVE mg/dL   Specific Gravity, Urine 1.015 1.005 - 1.030   Hgb urine dipstick NEGATIVE NEGATIVE   pH 6.5 5.0 - 8.0   Protein, ur NEGATIVE NEGATIVE mg/dL   Urobilinogen, UA 0.2 0.0 - 1.0 mg/dL   Nitrite NEGATIVE NEGATIVE   Leukocytes, UA NEGATIVE NEGATIVE    Labs Reviewed  POCT URINALYSIS DIP (DEVICE)     Allergies  Allergen Reactions  . Aspirin Hives  . Codeine Itching    Tolerates Hydrocodone OK.  . Ibuprofen Other (See Comments)    Happened in childhood  . Metformin And Related Other (See Comments)    Muscle weakness  . Morphine Other (See Comments)    Throat swelling  . Penicillins Other (See Comments)    Childhood reaction  Has patient had a PCN reaction causing immediate rash, facial/tongue/throat swelling, SOB or lightheadedness with hypotension: unknown Has patient had a PCN reaction causing severe rash involving mucus membranes or skin necrosis: unknown Has patient had a PCN reaction that required hospitalization unknown Has patient had a PCN reaction occurring within the last 10 years: unknown If all of the above answers are "NO", then may proceed with Cephalosporin use.    PMHx, SurgHx, SocialHx, Medications, and Allergies were reviewed in the Visit Navigator  and updated as appropriate.       Vanessa Kick, MD 12/16/16 [phone]

## 2016-12-15 NOTE — Discharge Instructions (Signed)
Today you were diagnosed with the following: 1. Abdominal discomfort in right lower quadrant    You have not been prescribed prescription medications this visit.  At the time of your visit I do not have reason to suspect your abdominal discomfort is anything dangerous. As discussed, please call your primary care physician to arrange follow up and further evaluation. Please ensure adequate fluid intake. You may take Tylenol occasionally if needed.  Your urinalysis was completely normal today.  If you are not improving over the next few days or feel you are worsening please follow up here or the Emergency Department if you are unable to see your regular doctor.

## 2017-06-08 ENCOUNTER — Other Ambulatory Visit: Payer: Self-pay

## 2017-06-08 ENCOUNTER — Encounter (HOSPITAL_COMMUNITY): Payer: Self-pay

## 2017-06-08 ENCOUNTER — Emergency Department (HOSPITAL_COMMUNITY)
Admission: EM | Admit: 2017-06-08 | Discharge: 2017-06-08 | Disposition: A | Discharge status: Home / Self Care | Payer: Self-pay | Attending: Emergency Medicine | Admitting: Emergency Medicine

## 2017-06-08 DIAGNOSIS — Z79899 Other long term (current) drug therapy: Secondary | ICD-10-CM | POA: Insufficient documentation

## 2017-06-08 DIAGNOSIS — E039 Hypothyroidism, unspecified: Secondary | ICD-10-CM | POA: Insufficient documentation

## 2017-06-08 DIAGNOSIS — I10 Essential (primary) hypertension: Secondary | ICD-10-CM | POA: Insufficient documentation

## 2017-06-08 DIAGNOSIS — E119 Type 2 diabetes mellitus without complications: Secondary | ICD-10-CM | POA: Insufficient documentation

## 2017-06-08 DIAGNOSIS — M436 Torticollis: Secondary | ICD-10-CM | POA: Insufficient documentation

## 2017-06-08 DIAGNOSIS — Z794 Long term (current) use of insulin: Secondary | ICD-10-CM | POA: Insufficient documentation

## 2017-06-08 MED ORDER — DIAZEPAM 5 MG PO TABS
5.0000 mg | ORAL_TABLET | Freq: Once | ORAL | Status: AC
  Administered 2017-06-08: 5 mg via ORAL
  Filled 2017-06-08: qty 1

## 2017-06-08 MED ORDER — DIAZEPAM 2 MG PO TABS
2.0000 mg | ORAL_TABLET | Freq: Two times a day (BID) | ORAL | 0 refills | Status: DC
Start: 2017-06-08 — End: 2018-02-21

## 2017-06-08 NOTE — ED Triage Notes (Signed)
Patient c/o left neck pain x 3 days. Patient states she has used ice, heat, Tylenol 500 mg and creams with no relief. Patient has increased pain when she turns her head.

## 2017-06-08 NOTE — ED Provider Notes (Signed)
[facility] DEPT Provider Note   CSN: [id number] Arrival date & time: 06/08/17  1300     History   Chief Complaint Chief Complaint  Patient presents with  . Neck Pain    HPI Lynn Martinez is a 46 y.o. female.  HPI 47 year old African-American female past medical history significant for PCOs, hypothyroid, htn, diabetes that presents to the emergency department today with complaints of left neck and left shoulder blade pain.  The patient states that her symptoms started approximately 2-3 days ago.  States that she is able to turn her head to the right but when she tries to turn her head to the left it is very difficult.  States that it feels like her muscles are pulling.  Reports pain with movement and palpation to the left.  Denies any recent injury.  Denies any history of same.  Patient denies any associated fevers, chills, lightheadedness, dizziness, headache, vision changes, difficulty breathing, difficulty swallowing, chest pain, shortness of breath.  The patient has been trying Tylenol and over-the-counter creams with no relief.  She also used heat which seemed to help but did not take it completely away.  States that ice made it worse. Past Medical History:  Diagnosis Date  . Anemia   . Anxiety   . Arthritis    "knees" (09/30/2015)  . Bipolar disorder ([place])   . Depression   . Fibroid    s/p myomectomy 2010, hysterectomy 2013  . Grave's disease   . Heart murmur   . Hormone disorder   . Hypertension   . Hypothyroidism   . 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 Migraine     otc med prn  . PCOS (polycystic ovarian syndrome)   . PMS (premenstrual syndrome)   . PTSD (post-traumatic stress disorder)   . Seasonal allergies   . Thyroid cancer ([place]) 2005  . Type II diabetes mellitus ([place])   . Vertigo     Patient Active Problem List   Diagnosis Date Noted  . Pain in the chest 09/30/2015  . Hypertension  09/30/2015  . Insulin dependent diabetes mellitus ([place]) 09/30/2015  . Chest pain 09/30/2015  . Cholelithiasis with acute cholecystitis 09/05/2013  . Borderline behavior 03/14/2012    Class: Chronic  . OCD (obsessive compulsive disorder) 03/14/2012    Class: Chronic  . PTSD (post-traumatic stress disorder) 03/14/2012    Class: Chronic  . 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 Lumbago without sciatica 03/12/2012  . Major depression, recurrent ([place]) 03/08/2012    Class: Chronic  . Snoring 12/02/2011  . PCOS (polycystic ovarian syndrome) 09/05/2011  . Hypothyroidism 08/13/2010  . VITAMIN D DEFICIENCY 08/13/2010  . ANEMIA-NOS 08/13/2010    Past Surgical History:  Procedure Laterality Date  . ABDOMINAL HYSTERECTOMY  10/11/2011   Procedure: HYSTERECTOMY ABDOMINAL;  Surgeon: Terrance Mass, MD;  Location: [facility];  Service: Gynecology;  Laterality: N/A;  With Repair of serosa.  . CHOLECYSTECTOMY N/A 09/06/2013   Procedure: LAPAROSCOPIC CHOLECYSTECTOMY WITH INTRAOPERATIVE CHOLANGIOGRAM;  Surgeon: Merrie Roof, MD;  Location: [facility];  Service: General;  Laterality: N/A;  . DIAGNOSTIC LAPAROSCOPY    . DILATION AND CURETTAGE OF UTERUS    . HERNIA REPAIR    . MYOMECTOMY  2010   LAPAROSCOPY  . TOTAL THYROIDECTOMY  2006  . [place]    OB History    Gravida Para  Term Preterm AB Living   2 0     2 0   SAB TAB Ectopic Multiple Live Births   2               Home Medications    Prior to Admission medications   Medication Sig Start Date End Date Taking? Authorizing Provider  cholecalciferol (VITAMIN D) 1000 UNITS tablet Take 2,000 Units by mouth daily.  03/14/12   Darrol Jump, MD  clotrimazole (LOTRIMIN) 1 % external solution Apply 1 application topically 2 (two) times daily. In between toes 09/14/15   Landis Martins, DPM  clotrimazole-betamethasone (LOTRISONE) cream Apply 1 application topically 2 (two) times daily. To bottoms  of both feet Patient not taking: Reported on 04/07/2016 09/14/15   Landis Martins, DPM  diazepam (VALIUM) 2 MG tablet Take 1 tablet (2 mg total) by mouth 2 (two) times daily. 06/08/17   Doristine Devoid, PA-C  docusate sodium (COLACE) 100 MG capsule Take 2 capsules (200 mg total) by mouth every morning. For stool softner Patient taking differently: Take 100 mg by mouth at bedtime. For stool softner 03/14/12   Darrol Jump, MD  ferrous sulfate 325 (65 FE) MG tablet Take 1 tablet (325 mg total) by mouth daily with breakfast. For low iron Patient taking differently: Take 325 mg by mouth 2 (two) times daily with a meal. For low iron 03/14/12   Darrol Jump, MD  Fiber CHEW Chew 2 tablets by mouth daily.     [provider]  glipiZIDE (GLUCOTROL) 10 MG tablet Take 10 mg by mouth 3 (three) times daily.     [provider]  hydrochlorothiazide (MICROZIDE) 12.5 MG capsule Take 12.5 mg by mouth daily.    [provider]  insulin glargine (LANTUS) 100 UNIT/ML injection Inject 60 Units into the skin at bedtime.     [provider]  levothyroxine (SYNTHROID, LEVOTHROID) 100 MCG tablet Take 112 mcg by mouth daily before breakfast.     [provider]  lithium carbonate 300 MG capsule Take 600 mg by mouth 2 (two) times daily.     [provider]  lurasidone (LATUDA) 80 MG TABS tablet Take 80 mg by mouth at bedtime.    [provider]  meclizine (ANTIVERT) 25 MG tablet Take 25 mg by mouth 2 (two) times daily as needed for dizziness.     [provider]  prazosin (MINIPRESS) 1 MG capsule Take 1 capsule (1 mg total) by mouth at bedtime and may repeat dose one time if needed. For sleep/nightmares 03/14/12   Darrol Jump, MD  propranolol (INDERAL) 40 MG tablet Take 40 mg by mouth 2 (two) times daily.     [provider]  sitaGLIPtin (JANUVIA) 100 MG tablet Take 100 mg by mouth daily.    [provider]  tolterodine  (DETROL) 2 MG tablet Take 2 mg by mouth 2 (two) times daily.     [provider]    Family History Family History  Problem Relation Age of Onset  . Thyroid disease Mother   . Diabetes Father   . Hypertension Father   . Heart disease Father   . Heart attack Father   . Cancer Maternal Aunt        BRAIN  . Cancer Maternal Grandmother        LYMPHOMA  . Hypertension Maternal Grandmother   . Cancer Paternal Grandmother        PANCREATIC  . Diabetes Paternal  Grandmother   . Hypertension Paternal Grandmother   . Hypertension Maternal Grandfather   . Hypertension Paternal Grandfather     Social History Social History   Tobacco Use  . Smoking status: Never Smoker  . Smokeless tobacco: Never Used  Substance Use Topics  . Alcohol use: No  . Drug use: No     Allergies   Aspirin; Codeine; Ibuprofen; Metformin and related; Morphine; and Penicillins   Review of Systems Review of Systems  Constitutional: Negative for chills and fever.  HENT: Negative for congestion and trouble swallowing.   Eyes: Negative for visual disturbance.  Respiratory: Negative for shortness of breath.   Gastrointestinal: Negative for vomiting.  Musculoskeletal: Positive for myalgias and neck stiffness.  Neurological: Negative for dizziness, light-headedness and headaches.     Physical Exam Updated Vital Signs BP 140/76 (BP Location: Left Arm)   Pulse 70   Temp 98.8 F (37.1 C) (Oral)   Resp 16   Ht 5\' 7"  (1.702 m)   Wt 133.4 kg (294 lb 2 oz)   LMP 09/20/2011   SpO2 98%   BMI 46.07 kg/m   Physical Exam  Constitutional: She appears well-developed and well-nourished. No distress.  HENT:  Head: Normocephalic and atraumatic.  Eyes: Right eye exhibits no discharge. Left eye exhibits no discharge. No scleral icterus.  Neck: Trachea normal, normal range of motion and phonation normal. Neck supple. No thyroid mass and no thyromegaly present.    Patient will full range of motion with  rotation to the right.  Has pain with range of motion to the left and not able to go past 30 degrees.  Patient has full flexion extension.  This does not cause pain.  Patient has no midline C-spine tenderness.  Does have significant with exquisite left paraspinal muscular tenderness that radiates to the left trapezius into the left sternocleidomastoid.  Pulmonary/Chest: No respiratory distress.  Musculoskeletal: Normal range of motion.  Neurological: She is alert.  Skin: Skin is warm and dry. Capillary refill takes less than 2 seconds. No pallor.  Psychiatric: Her behavior is normal. Judgment and thought content normal.  Nursing note and vitals reviewed.    ED Treatments / Results  Labs (all labs ordered are listed, but only abnormal results are displayed) Labs Reviewed - No data to display  EKG  EKG Interpretation None       Radiology No results found.  Procedures Procedures (including critical care time)  Medications Ordered in ED Medications  diazepam (VALIUM) tablet 5 mg (not administered)     Initial Impression / Assessment and Plan / ED Course  I have reviewed the triage vital signs and the nursing notes.  Pertinent labs & imaging results that were available during my care of the patient were reviewed by me and considered in my medical decision making (see chart for details).     Patient presents to the ED for evaluation of left-sided neck pain that is worse with movement and palpation.  Relieved by heat and worsened by ice.  Patient denies any associated fevers, difficulty breathing, difficulty swallowing.  The patient is exquisitely tender to the left trapezius and left sternocleidomastoid muscle to palpation.  Pain with range of motion with rotation to the left.  Patient has no midline tenderness.  She has normal flexion extension of the neck.  No associated fevers.  Low suspicion for meningitis.  Patient symptoms seem consistent with torticollis.  Given that  symptoms are relieved by heat and pain is reproduced with palpation.  I will prescribe patient Valium to help with muscle spasms.  Encourage symptomatic treatment at home.  Patient does have history of hypothyroidism.  I do not feel any significant goiter.  There appears to be no tracheal deviation or difficulty breathing or swallowing.  I had instructed patient that if symptoms not improve the next 2-3 days she will need recheck by ED or primary care to rule out any further etiology of symptoms.  Patient is overall well-appearing and nontoxic.  Vital signs are very reassuring.  She is afebrile, no tachycardia, no hypotension noted.  Pt is hemodynamically stable, in NAD, & able to ambulate in the ED. Evaluation does not show pathology that would require ongoing emergent intervention or inpatient treatment. I explained the diagnosis to the patient. Pain has been managed & has no complaints prior to dc. Pt is comfortable with above plan and is stable for discharge at this time. All questions were answered prior to disposition. Strict return precautions for f/u to the ED were discussed. Encouraged follow up with PCP.   Final Clinical Impressions(s) / ED Diagnoses   Final diagnoses:  Torticollis, acute    ED Discharge Orders        Ordered    diazepam (VALIUM) 2 MG tablet  2 times daily     06/08/17 1722       Aaron Edelman 06/08/17 1731    Tegeler, Gwenyth Allegra, MD 06/08/17 [phone]

## 2017-06-08 NOTE — Discharge Instructions (Signed)
This is likely something called torticollis which we discussed.  I would recommend you keeping heat to the affected area.  Continue using your NSAID cream at home.  Take the Valium to help with muscle spasms.  This medication will make you drowsy so do not drive with it.  Warm soaks in Epsom salt or hot showers.  If you are not feeling improved in the next 2-3 days return to the ED or follow-up with your primary care doctor for further evaluation and management.  If you develop any worsening symptoms including fever, worsening pain, difficulty swallowing return to the ED immediately.

## 2017-06-09 ENCOUNTER — Emergency Department (HOSPITAL_COMMUNITY)
Admission: EM | Admit: 2017-06-09 | Discharge: 2017-06-09 | Disposition: A | Discharge status: Home / Self Care | Payer: Self-pay | Attending: Emergency Medicine | Admitting: Emergency Medicine

## 2017-06-09 ENCOUNTER — Encounter (HOSPITAL_COMMUNITY): Payer: Self-pay | Admitting: Nurse Practitioner

## 2017-06-09 DIAGNOSIS — I10 Essential (primary) hypertension: Secondary | ICD-10-CM | POA: Insufficient documentation

## 2017-06-09 DIAGNOSIS — Y9384 Activity, sleeping: Secondary | ICD-10-CM | POA: Insufficient documentation

## 2017-06-09 DIAGNOSIS — E039 Hypothyroidism, unspecified: Secondary | ICD-10-CM | POA: Insufficient documentation

## 2017-06-09 DIAGNOSIS — Y929 Unspecified place or not applicable: Secondary | ICD-10-CM | POA: Insufficient documentation

## 2017-06-09 DIAGNOSIS — X509XXA Other and unspecified overexertion or strenuous movements or postures, initial encounter: Secondary | ICD-10-CM | POA: Insufficient documentation

## 2017-06-09 DIAGNOSIS — Z794 Long term (current) use of insulin: Secondary | ICD-10-CM | POA: Insufficient documentation

## 2017-06-09 DIAGNOSIS — Y999 Unspecified external cause status: Secondary | ICD-10-CM | POA: Insufficient documentation

## 2017-06-09 DIAGNOSIS — S161XXA Strain of muscle, fascia and tendon at neck level, initial encounter: Secondary | ICD-10-CM | POA: Insufficient documentation

## 2017-06-09 DIAGNOSIS — Z79899 Other long term (current) drug therapy: Secondary | ICD-10-CM | POA: Insufficient documentation

## 2017-06-09 DIAGNOSIS — E119 Type 2 diabetes mellitus without complications: Secondary | ICD-10-CM | POA: Insufficient documentation

## 2017-06-09 MED ORDER — HYDROCODONE-ACETAMINOPHEN 5-325 MG PO TABS: 1.0000 | ORAL_TABLET | Freq: Four times a day (QID) | ORAL | 0 refills | Status: DC | PRN

## 2017-06-09 NOTE — ED Triage Notes (Signed)
Pt is requesting to re-evaluated for left sided neck that she was evaluated for 24 hours ago. States the valium medication isn't helping, the pain has been worse to an extent of not being able to sleep.

## 2017-06-09 NOTE — ED Provider Notes (Signed)
[facility] DEPT Provider Note   CSN: [id number] Arrival date & time: 06/09/17  1417     History   Chief Complaint Chief Complaint  Patient presents with  . Neck Pain    HPI Lynn Martinez is a 46 y.o. female.  Patient is a 46 year old female with a history of hypertension, diabetes, bipolar disorder and Graves' disease who presents with neck pain.  She states she woke up about 3 days ago with pain to the left side of her neck radiating to her left upper back.  It is worse with movement of the neck.  There is no radiation down the arm.  No numbness or weakness to her extremities.  No noticeable swelling or rash.  She denies any traumatic injury to her neck.  She has had some prior neck problems but not for several years.  No history of herniated disks.  She was seen here yesterday for similar symptoms and was given Valium and diclofenac cream which she has been using with no improvement in symptoms.  She has not yet followed up with her PCP.      Past Medical History:  Diagnosis Date  . Anemia   . Anxiety   . Arthritis    "knees" (09/30/2015)  . Bipolar disorder ([place])   . Depression   . Fibroid    s/p myomectomy 2010, hysterectomy 2013  . Grave's disease   . Heart murmur   . Hormone disorder   . Hypertension   . Hypothyroidism   . 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 Migraine     otc med prn  . PCOS (polycystic ovarian syndrome)   . PMS (premenstrual syndrome)   . PTSD (post-traumatic stress disorder)   . Seasonal allergies   . Thyroid cancer ([place]) 2005  . Type II diabetes mellitus ([place])   . Vertigo     Patient Active Problem List   Diagnosis Date Noted  . Pain in the chest 09/30/2015  . Hypertension 09/30/2015  . Insulin dependent diabetes mellitus ([place]) 09/30/2015  . Chest pain 09/30/2015  . Cholelithiasis with acute cholecystitis 09/05/2013  . Borderline behavior 03/14/2012    Class: Chronic    . OCD (obsessive compulsive disorder) 03/14/2012    Class: Chronic  . PTSD (post-traumatic stress disorder) 03/14/2012    Class: Chronic  . 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 Lumbago without sciatica 03/12/2012  . Major depression, recurrent ([place]) 03/08/2012    Class: Chronic  . Snoring 12/02/2011  . PCOS (polycystic ovarian syndrome) 09/05/2011  . Hypothyroidism 08/13/2010  . VITAMIN D DEFICIENCY 08/13/2010  . ANEMIA-NOS 08/13/2010    Past Surgical History:  Procedure Laterality Date  . ABDOMINAL HYSTERECTOMY  10/11/2011   Procedure: HYSTERECTOMY ABDOMINAL;  Surgeon: Terrance Mass, MD;  Location: [facility];  Service: Gynecology;  Laterality: N/A;  With Repair of serosa.  . CHOLECYSTECTOMY N/A 09/06/2013   Procedure: LAPAROSCOPIC CHOLECYSTECTOMY WITH INTRAOPERATIVE CHOLANGIOGRAM;  Surgeon: Merrie Roof, MD;  Location: [facility];  Service: General;  Laterality: N/A;  . DIAGNOSTIC LAPAROSCOPY    . DILATION AND CURETTAGE OF UTERUS    . HERNIA REPAIR    . MYOMECTOMY  2010   LAPAROSCOPY  . TOTAL THYROIDECTOMY  2006  . UMBILICAL HERNIA REPAIR  1982    OB History    Gravida Para Term Preterm AB Living   2 0  2 0   SAB TAB Ectopic Multiple Live Births   2               Home Medications    Prior to Admission medications   Medication Sig Start Date End Date Taking? Authorizing Provider  cholecalciferol (VITAMIN D) 1000 UNITS tablet Take 2,000 Units by mouth daily.  03/14/12   Darrol Jump, MD  clotrimazole (LOTRIMIN) 1 % external solution Apply 1 application topically 2 (two) times daily. In between toes 09/14/15   Landis Martins, DPM  clotrimazole-betamethasone (LOTRISONE) cream Apply 1 application topically 2 (two) times daily. To bottoms of both feet Patient not taking: Reported on 04/07/2016 09/14/15   Landis Martins, DPM  diazepam (VALIUM) 2 MG tablet Take 1 tablet (2 mg total) by mouth 2 (two) times daily. 06/08/17   Doristine Devoid, PA-C  docusate sodium (COLACE) 100 MG capsule Take 2 capsules (200 mg total) by mouth every morning. For stool softner Patient taking differently: Take 100 mg by mouth at bedtime. For stool softner 03/14/12   Darrol Jump, MD  ferrous sulfate 325 (65 FE) MG tablet Take 1 tablet (325 mg total) by mouth daily with breakfast. For low iron Patient taking differently: Take 325 mg by mouth 2 (two) times daily with a meal. For low iron 03/14/12   Darrol Jump, MD  Fiber CHEW Chew 2 tablets by mouth daily.     [provider]  glipiZIDE (GLUCOTROL) 10 MG tablet Take 10 mg by mouth 3 (three) times daily.     [provider]  hydrochlorothiazide (MICROZIDE) 12.5 MG capsule Take 12.5 mg by mouth daily.    [provider]  HYDROcodone-acetaminophen (NORCO/VICODIN) 5-325 MG tablet Take 1-2 tablets by mouth every 6 (six) hours as needed. 06/09/17   Malvin Johns, MD  insulin glargine (LANTUS) 100 UNIT/ML injection Inject 60 Units into the skin at bedtime.     [provider]  levothyroxine (SYNTHROID, LEVOTHROID) 100 MCG tablet Take 112 mcg by mouth daily before breakfast.     [provider]  lithium carbonate 300 MG capsule Take 600 mg by mouth 2 (two) times daily.     [provider]  lurasidone (LATUDA) 80 MG TABS tablet Take 80 mg by mouth at bedtime.    [provider]  meclizine (ANTIVERT) 25 MG tablet Take 25 mg by mouth 2 (two) times daily as needed for dizziness.     [provider]  prazosin (MINIPRESS) 1 MG capsule Take 1 capsule (1 mg total) by mouth at bedtime and may repeat dose one time if needed. For sleep/nightmares 03/14/12   Darrol Jump, MD  propranolol (INDERAL) 40 MG tablet Take 40 mg by mouth 2 (two) times daily.     [provider]  sitaGLIPtin (JANUVIA) 100 MG tablet Take 100 mg by mouth daily.    [provider]  tolterodine (DETROL) 2 MG tablet Take 2 mg by mouth 2 (two) times  daily.     [provider]    Family History Family History  Problem Relation Age of Onset  . Thyroid disease Mother   . Diabetes Father   . Hypertension Father   . Heart disease Father   . Heart attack Father   . Cancer Maternal Aunt        BRAIN  . Cancer Maternal Grandmother        LYMPHOMA  . Hypertension Maternal Grandmother   . Cancer Paternal Grandmother  PANCREATIC  . Diabetes Paternal Grandmother   . Hypertension Paternal Grandmother   . Hypertension Maternal Grandfather   . Hypertension Paternal Grandfather     Social History Social History   Tobacco Use  . Smoking status: Never Smoker  . Smokeless tobacco: Never Used  Substance Use Topics  . Alcohol use: No  . Drug use: No     Allergies   Aspirin; Codeine; Ibuprofen; Metformin and related; Morphine; and Penicillins   Review of Systems Review of Systems  Constitutional: Negative for fever.  Gastrointestinal: Negative for nausea and vomiting.  Musculoskeletal: Positive for neck pain. Negative for arthralgias, back pain and joint swelling.  Skin: Negative for wound.  Neurological: Negative for weakness, numbness and headaches.     Physical Exam Updated Vital Signs BP 134/82 (BP Location: Left Arm)   Pulse 65   Temp 98.5 F (36.9 C) (Oral)   Resp 20   LMP 09/20/2011   SpO2 99%   Physical Exam  Constitutional: She is oriented to person, place, and time. She appears well-developed and well-nourished.  HENT:  Head: Normocephalic and atraumatic.  Neck: Normal range of motion. Neck supple.  Cardiovascular: Normal rate.  Pulmonary/Chest: Effort normal.  Musculoskeletal: She exhibits tenderness. She exhibits no edema.  Patient has tenderness to the left paraspinal muscles and along the left trapezius muscle.  There is no bony tenderness.  No midline cervical tenderness.  No visible swelling or rash noted. Motor 5 out of 5 all extremities, she has normal sensation to light touch touch  in the upper extremities.  Radial pulses are intact.  Neurological: She is alert and oriented to person, place, and time.  Skin: Skin is warm and dry.  Psychiatric: She has a normal mood and affect.     ED Treatments / Results  Labs (all labs ordered are listed, but only abnormal results are displayed) Labs Reviewed - No data to display  EKG  EKG Interpretation None       Radiology No results found.  Procedures Procedures (including critical care time)  Medications Ordered in ED Medications - No data to display   Initial Impression / Assessment and Plan / ED Course  I have reviewed the triage vital signs and the nursing notes.  Pertinent labs & imaging results that were available during my care of the patient were reviewed by me and considered in my medical decision making (see chart for details).     Patient has muscular tenderness to the left neck.  There is no bony tenderness.  No indication for emergent imaging.  She has no neurologic deficit.  She was given a prescription for Vicodin to use in addition to her current regimen although she was cautioned that she should not take the Valium and Vicodin together as they can both cause drowsiness.  She was advised to follow-up with her PCP if the symptoms continue.  Return precautions were given.  Final Clinical Impressions(s) / ED Diagnoses   Final diagnoses:  Strain of neck muscle, initial encounter    ED Discharge Orders        Ordered    HYDROcodone-acetaminophen (NORCO/VICODIN) 5-325 MG tablet  Every 6 hours PRN     06/09/17 1613       Malvin Johns, MD 06/09/17 1623

## 2017-09-08 ENCOUNTER — Encounter (HOSPITAL_COMMUNITY): Payer: Self-pay | Admitting: *Deleted

## 2017-09-08 ENCOUNTER — Emergency Department (HOSPITAL_COMMUNITY)
Admission: EM | Admit: 2017-09-08 | Discharge: 2017-09-08 | Disposition: A | Discharge status: Home / Self Care | Payer: Self-pay | Attending: Emergency Medicine | Admitting: Emergency Medicine

## 2017-09-08 DIAGNOSIS — I10 Essential (primary) hypertension: Secondary | ICD-10-CM | POA: Insufficient documentation

## 2017-09-08 DIAGNOSIS — M25562 Pain in left knee: Secondary | ICD-10-CM | POA: Insufficient documentation

## 2017-09-08 DIAGNOSIS — Z79899 Other long term (current) drug therapy: Secondary | ICD-10-CM | POA: Insufficient documentation

## 2017-09-08 DIAGNOSIS — E119 Type 2 diabetes mellitus without complications: Secondary | ICD-10-CM | POA: Insufficient documentation

## 2017-09-08 DIAGNOSIS — E039 Hypothyroidism, unspecified: Secondary | ICD-10-CM | POA: Insufficient documentation

## 2017-09-08 DIAGNOSIS — Z794 Long term (current) use of insulin: Secondary | ICD-10-CM | POA: Insufficient documentation

## 2017-09-08 MED ORDER — LIDOCAINE 5 % EX PTCH
1.0000 | MEDICATED_PATCH | Freq: Once | CUTANEOUS | Status: DC
  Administered 2017-09-08: 1 via TRANSDERMAL
  Filled 2017-09-08: qty 1

## 2017-09-08 MED ORDER — LIDOCAINE 4 % EX CREA: 1.0000 "application " | TOPICAL_CREAM | Freq: Three times a day (TID) | CUTANEOUS | 0 refills | Status: DC | PRN

## 2017-09-08 NOTE — ED Provider Notes (Signed)
[facility] DEPT Provider Note   CSN: [id number] Arrival date & time: 09/08/17  1710     History   Chief Complaint Chief Complaint  Patient presents with  . Headache  . Knee Pain    HPI  Blood pressure 135/81, pulse 61, temperature 98 F (36.7 C), temperature source Oral, resp. rate 18, last menstrual period 09/20/2011, SpO2 98 %.  Lynn Martinez is a 47 y.o. female complaining of severe point tenderness to posterior left knee onset 3 days ago after waking.  There was no trauma.  She states is causing her to limp.  She is taking 500 mg acetaminophen 2 tabs once a day with little relief.  There is no anterior knee pain.  No overlying skin changes.  Past Medical History:  Diagnosis Date  . Anemia   . Anxiety   . Arthritis    "knees" (09/30/2015)  . Bipolar disorder ([place])   . Depression   . Fibroid    s/p myomectomy 2010, hysterectomy 2013  . Grave's disease   . Heart murmur   . Hormone disorder   . Hypertension   . Hypothyroidism   . 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 Migraine     otc med prn  . PCOS (polycystic ovarian syndrome)   . PMS (premenstrual syndrome)   . PTSD (post-traumatic stress disorder)   . Seasonal allergies   . Thyroid cancer ([place]) 2005  . Type II diabetes mellitus ([place])   . Vertigo     Patient Active Problem List   Diagnosis Date Noted  . Pain in the chest 09/30/2015  . Hypertension 09/30/2015  . Insulin dependent diabetes mellitus ([place]) 09/30/2015  . Chest pain 09/30/2015  . Cholelithiasis with acute cholecystitis 09/05/2013  . Borderline behavior 03/14/2012    Class: Chronic  . OCD (obsessive compulsive disorder) 03/14/2012    Class: Chronic  . PTSD (post-traumatic stress disorder) 03/14/2012    Class: Chronic  . 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 Lumbago without sciatica 03/12/2012  . Major depression, recurrent ([place]) 03/08/2012    Class: Chronic  . Snoring 12/02/2011  . PCOS (polycystic ovarian syndrome) 09/05/2011  . Hypothyroidism 08/13/2010  . VITAMIN D DEFICIENCY 08/13/2010  . ANEMIA-NOS 08/13/2010    Past Surgical History:  Procedure Laterality Date  . ABDOMINAL HYSTERECTOMY  10/11/2011   Procedure: HYSTERECTOMY ABDOMINAL;  Surgeon: Terrance Mass, MD;  Location: [facility];  Service: Gynecology;  Laterality: N/A;  With Repair of serosa.  . CHOLECYSTECTOMY N/A 09/06/2013   Procedure: LAPAROSCOPIC CHOLECYSTECTOMY WITH INTRAOPERATIVE CHOLANGIOGRAM;  Surgeon: Merrie Roof, MD;  Location: [facility];  Service: General;  Laterality: N/A;  . DIAGNOSTIC LAPAROSCOPY    . DILATION AND CURETTAGE OF UTERUS    . HERNIA REPAIR    . MYOMECTOMY  2010   LAPAROSCOPY  . TOTAL THYROIDECTOMY  2006  . UMBILICAL HERNIA REPAIR  1982     OB History    Gravida  2   Para  0   Term      Preterm      AB  2   Living  0     SAB  2   TAB      Ectopic      Multiple      Live Births               Home Medications  Prior to Admission medications   Medication Sig Start Date End Date Taking? Authorizing Provider  cholecalciferol (VITAMIN D) 1000 UNITS tablet Take 2,000 Units by mouth daily.  03/14/12   Darrol Jump, MD  clotrimazole (LOTRIMIN) 1 % external solution Apply 1 application topically 2 (two) times daily. In between toes 09/14/15   Landis Martins, DPM  clotrimazole-betamethasone (LOTRISONE) cream Apply 1 application topically 2 (two) times daily. To bottoms of both feet Patient not taking: Reported on 04/07/2016 09/14/15   Landis Martins, DPM  diazepam (VALIUM) 2 MG tablet Take 1 tablet (2 mg total) by mouth 2 (two) times daily. 06/08/17   Doristine Devoid, PA-C  docusate sodium (COLACE) 100 MG capsule Take 2 capsules (200 mg total) by mouth every morning. For stool softner Patient taking differently: Take 100 mg by mouth at bedtime. For stool softner 03/14/12   Darrol Jump, MD  ferrous sulfate  325 (65 FE) MG tablet Take 1 tablet (325 mg total) by mouth daily with breakfast. For low iron Patient taking differently: Take 325 mg by mouth 2 (two) times daily with a meal. For low iron 03/14/12   Darrol Jump, MD  Fiber CHEW Chew 2 tablets by mouth daily.     [provider]  glipiZIDE (GLUCOTROL) 10 MG tablet Take 10 mg by mouth 3 (three) times daily.     [provider]  hydrochlorothiazide (MICROZIDE) 12.5 MG capsule Take 12.5 mg by mouth daily.    [provider]  HYDROcodone-acetaminophen (NORCO/VICODIN) 5-325 MG tablet Take 1-2 tablets by mouth every 6 (six) hours as needed. 06/09/17   Malvin Johns, MD  insulin glargine (LANTUS) 100 UNIT/ML injection Inject 60 Units into the skin at bedtime.     [provider]  levothyroxine (SYNTHROID, LEVOTHROID) 100 MCG tablet Take 112 mcg by mouth daily before breakfast.     [provider]  lidocaine (LMX) 4 % cream Apply 1 application topically 3 (three) times daily as needed. 09/08/17   Militza Devery, Elmyra Ricks, PA-C  lithium carbonate 300 MG capsule Take 600 mg by mouth 2 (two) times daily.     [provider]  lurasidone (LATUDA) 80 MG TABS tablet Take 80 mg by mouth at bedtime.    [provider]  meclizine (ANTIVERT) 25 MG tablet Take 25 mg by mouth 2 (two) times daily as needed for dizziness.     [provider]  prazosin (MINIPRESS) 1 MG capsule Take 1 capsule (1 mg total) by mouth at bedtime and may repeat dose one time if needed. For sleep/nightmares 03/14/12   Darrol Jump, MD  propranolol (INDERAL) 40 MG tablet Take 40 mg by mouth 2 (two) times daily.     [provider]  sitaGLIPtin (JANUVIA) 100 MG tablet Take 100 mg by mouth daily.    [provider]  tolterodine (DETROL) 2 MG tablet Take 2 mg by mouth 2 (two) times daily.     [provider]    Family History Family History  Problem Relation Age of Onset  . Thyroid disease Mother   .  Diabetes Father   . Hypertension Father   . Heart disease Father   . Heart attack Father   . Cancer Maternal Aunt        BRAIN  . Cancer Maternal Grandmother        LYMPHOMA  . Hypertension Maternal Grandmother   . Cancer Paternal Grandmother        PANCREATIC  . Diabetes  Paternal Grandmother   . Hypertension Paternal Grandmother   . Hypertension Maternal Grandfather   . Hypertension Paternal Grandfather     Social History Social History   Tobacco Use  . Smoking status: Never Smoker  . Smokeless tobacco: Never Used  Substance Use Topics  . Alcohol use: No  . Drug use: No     Allergies   Aspirin; Codeine; Ibuprofen; Metformin and related; Morphine; and Penicillins   Review of Systems Review of Systems   Physical Exam Updated Vital Signs BP 135/81 (BP Location: Left Arm)   Pulse 61   Temp 98 F (36.7 C) (Oral)   Resp 18   LMP 09/20/2011   SpO2 98%   Physical Exam  Constitutional: She is oriented to person, place, and time. She appears well-developed and well-nourished. No distress.  HENT:  Head: Normocephalic and atraumatic.  Mouth/Throat: Oropharynx is clear and moist.  Eyes: Pupils are equal, round, and reactive to light. Conjunctivae and EOM are normal.  Neck: Normal range of motion.  Cardiovascular: Normal rate, regular rhythm and intact distal pulses.  Pulmonary/Chest: Effort normal and breath sounds normal.  Abdominal: Soft. There is no tenderness.  Musculoskeletal: Normal range of motion. She exhibits tenderness.       Legs: Neurological: She is alert and oriented to person, place, and time.  Skin: She is not diaphoretic.  Psychiatric: She has a normal mood and affect.  Nursing note and vitals reviewed.    ED Treatments / Results  Labs (all labs ordered are listed, but only abnormal results are displayed) Labs Reviewed - No data to display  EKG None  Radiology No results found.  Procedures Procedures (including critical care  time)  Medications Ordered in ED Medications  lidocaine (LIDODERM) 5 % 1 patch (1 patch Transdermal Patch Applied 09/08/17 1835)     Initial Impression / Assessment and Plan / ED Course  I have reviewed the triage vital signs and the nursing notes.  Pertinent labs & imaging results that were available during my care of the patient were reviewed by me and considered in my medical decision making (see chart for details).     Vitals:   09/08/17 1722 09/08/17 1852  BP: (!) 159/86 135/81  Pulse: 66 61  Resp: 18 18  Temp: 98 F (36.7 C)   TempSrc: Oral   SpO2: 96% 98%    Medications  lidocaine (LIDODERM) 5 % 1 patch (1 patch Transdermal Patch Applied 09/08/17 1835)    Lynn Martinez is 47 y.o. female presenting with acute severe point tenderness as diagrammed, likely muscle strain.  Offered lidocaine injection but patient declined.  Patient given lidocaine patches, she is allergic to NSAIDs.  Crutches provided and counseled her on use of Tylenol.  Will follow closely with primary care.  Evaluation does not show pathology that would require ongoing emergent intervention or inpatient treatment. Pt is hemodynamically stable and mentating appropriately. Discussed findings and plan with patient/guardian, who agrees with care plan. All questions answered. Return precautions discussed and outpatient follow up given.      Final Clinical Impressions(s) / ED Diagnoses   Final diagnoses:  Acute pain of left knee    ED Discharge Orders        Ordered    lidocaine (LMX) 4 % cream  3 times daily PRN     09/08/17 1853       Barbarajean Kinzler, Charna Elizabeth 09/08/17 2018    Dorie Rank, MD 09/08/17 [phone]

## 2017-09-08 NOTE — Discharge Instructions (Addendum)
Take acetaminophen (Tylenol) up to 1000 mg (this is normally 2 extra strength pills) up to 3 times a day. Do not drink alcohol. Make sure your other medications do not contain acetaminophen (Read the labels!)

## 2017-09-08 NOTE — ED Triage Notes (Signed)
Pt complains of left knee pain, shoulder pain, and headache for the past 3 days.

## 2017-09-26 ENCOUNTER — Emergency Department (HOSPITAL_COMMUNITY)
Admission: EM | Admit: 2017-09-26 | Discharge: 2017-09-26 | Disposition: A | Discharge status: Home / Self Care | Payer: Self-pay | Attending: Emergency Medicine | Admitting: Emergency Medicine

## 2017-09-26 ENCOUNTER — Encounter (HOSPITAL_COMMUNITY): Payer: Self-pay | Admitting: Emergency Medicine

## 2017-09-26 DIAGNOSIS — E039 Hypothyroidism, unspecified: Secondary | ICD-10-CM | POA: Insufficient documentation

## 2017-09-26 DIAGNOSIS — Z794 Long term (current) use of insulin: Secondary | ICD-10-CM | POA: Insufficient documentation

## 2017-09-26 DIAGNOSIS — J302 Other seasonal allergic rhinitis: Secondary | ICD-10-CM | POA: Insufficient documentation

## 2017-09-26 DIAGNOSIS — J069 Acute upper respiratory infection, unspecified: Secondary | ICD-10-CM | POA: Insufficient documentation

## 2017-09-26 DIAGNOSIS — E119 Type 2 diabetes mellitus without complications: Secondary | ICD-10-CM | POA: Insufficient documentation

## 2017-09-26 DIAGNOSIS — Z79899 Other long term (current) drug therapy: Secondary | ICD-10-CM | POA: Insufficient documentation

## 2017-09-26 DIAGNOSIS — B9789 Other viral agents as the cause of diseases classified elsewhere: Secondary | ICD-10-CM

## 2017-09-26 DIAGNOSIS — I10 Essential (primary) hypertension: Secondary | ICD-10-CM | POA: Insufficient documentation

## 2017-09-26 NOTE — ED Provider Notes (Signed)
[facility] DEPT Provider Note   CSN: [id number] Arrival date & time: 09/26/17  1053     History   Chief Complaint Chief Complaint  Patient presents with  . Cough  . Nasal Congestion    HPI Lynn Martinez is a 47 y.o. female.  The history is provided by the patient. No language interpreter was used.  Cough  This is a new problem. The current episode started more than 2 days ago. The problem occurs constantly. The problem has been gradually worsening. The cough is non-productive. The maximum temperature recorded prior to her arrival was 100 to 100.9 F. Associated symptoms include myalgias. She has tried nothing for the symptoms. Her past medical history does not include bronchitis.   Pt complains of a cough congestion, vomiting and diarrhea.   Pt feels like she has a virus Past Medical History:  Diagnosis Date  . Anemia   . Anxiety   . Arthritis    "knees" (09/30/2015)  . Bipolar disorder ([place])   . Depression   . Fibroid    s/p myomectomy 2010, hysterectomy 2013  . Grave's disease   . Heart murmur   . Hormone disorder   . Hypertension   . Hypothyroidism   . 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 Migraine     otc med prn  . PCOS (polycystic ovarian syndrome)   . PMS (premenstrual syndrome)   . PTSD (post-traumatic stress disorder)   . Seasonal allergies   . Thyroid cancer ([place]) 2005  . Type II diabetes mellitus ([place])   . Vertigo     Patient Active Problem List   Diagnosis Date Noted  . Pain in the chest 09/30/2015  . Hypertension 09/30/2015  . Insulin dependent diabetes mellitus ([place]) 09/30/2015  . Chest pain 09/30/2015  . Cholelithiasis with acute cholecystitis 09/05/2013  . Borderline behavior 03/14/2012    Class: Chronic  . OCD (obsessive compulsive disorder) 03/14/2012    Class: Chronic  . PTSD (post-traumatic stress disorder) 03/14/2012    Class: Chronic  . 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 Lumbago without sciatica 03/12/2012  . Major depression, recurrent ([place]) 03/08/2012    Class: Chronic  . Snoring 12/02/2011  . PCOS (polycystic ovarian syndrome) 09/05/2011  . Hypothyroidism 08/13/2010  . VITAMIN D DEFICIENCY 08/13/2010  . ANEMIA-NOS 08/13/2010    Past Surgical History:  Procedure Laterality Date  . ABDOMINAL HYSTERECTOMY  10/11/2011   Procedure: HYSTERECTOMY ABDOMINAL;  Surgeon: Terrance Mass, MD;  Location: [facility];  Service: Gynecology;  Laterality: N/A;  With Repair of serosa.  . CHOLECYSTECTOMY N/A 09/06/2013   Procedure: LAPAROSCOPIC CHOLECYSTECTOMY WITH INTRAOPERATIVE CHOLANGIOGRAM;  Surgeon: Merrie Roof, MD;  Location: [facility];  Service: General;  Laterality: N/A;  . DIAGNOSTIC LAPAROSCOPY    . DILATION AND CURETTAGE OF UTERUS    . HERNIA REPAIR    . MYOMECTOMY  2010   LAPAROSCOPY  . TOTAL THYROIDECTOMY  2006  . UMBILICAL HERNIA REPAIR  1982     OB History    Gravida  2   Para  0   Term      Preterm      AB  2   Living  0     SAB  2   TAB      Ectopic      Multiple      Live Births  Home Medications    Prior to Admission medications   Medication Sig Start Date End Date Taking? Authorizing Provider  cholecalciferol (VITAMIN D) 1000 UNITS tablet Take 2,000 Units by mouth daily.  03/14/12   Darrol Jump, MD  clotrimazole (LOTRIMIN) 1 % external solution Apply 1 application topically 2 (two) times daily. In between toes 09/14/15   Landis Martins, DPM  clotrimazole-betamethasone (LOTRISONE) cream Apply 1 application topically 2 (two) times daily. To bottoms of both feet Patient not taking: Reported on 04/07/2016 09/14/15   Landis Martins, DPM  diazepam (VALIUM) 2 MG tablet Take 1 tablet (2 mg total) by mouth 2 (two) times daily. 06/08/17   Doristine Devoid, PA-C  docusate sodium (COLACE) 100 MG capsule Take 2 capsules (200 mg total) by mouth every morning. For  stool softner Patient taking differently: Take 100 mg by mouth at bedtime. For stool softner 03/14/12   Darrol Jump, MD  ferrous sulfate 325 (65 FE) MG tablet Take 1 tablet (325 mg total) by mouth daily with breakfast. For low iron Patient taking differently: Take 325 mg by mouth 2 (two) times daily with a meal. For low iron 03/14/12   Darrol Jump, MD  Fiber CHEW Chew 2 tablets by mouth daily.     [provider]  glipiZIDE (GLUCOTROL) 10 MG tablet Take 10 mg by mouth 3 (three) times daily.     [provider]  hydrochlorothiazide (MICROZIDE) 12.5 MG capsule Take 12.5 mg by mouth daily.    [provider]  HYDROcodone-acetaminophen (NORCO/VICODIN) 5-325 MG tablet Take 1-2 tablets by mouth every 6 (six) hours as needed. 06/09/17   Malvin Johns, MD  insulin glargine (LANTUS) 100 UNIT/ML injection Inject 60 Units into the skin at bedtime.     [provider]  levothyroxine (SYNTHROID, LEVOTHROID) 100 MCG tablet Take 112 mcg by mouth daily before breakfast.     [provider]  lidocaine (LMX) 4 % cream Apply 1 application topically 3 (three) times daily as needed. 09/08/17   Pisciotta, Elmyra Ricks, PA-C  lithium carbonate 300 MG capsule Take 600 mg by mouth 2 (two) times daily.     [provider]  lurasidone (LATUDA) 80 MG TABS tablet Take 80 mg by mouth at bedtime.    [provider]  meclizine (ANTIVERT) 25 MG tablet Take 25 mg by mouth 2 (two) times daily as needed for dizziness.     [provider]  prazosin (MINIPRESS) 1 MG capsule Take 1 capsule (1 mg total) by mouth at bedtime and may repeat dose one time if needed. For sleep/nightmares 03/14/12   Darrol Jump, MD  propranolol (INDERAL) 40 MG tablet Take 40 mg by mouth 2 (two) times daily.     [provider]  sitaGLIPtin (JANUVIA) 100 MG tablet Take 100 mg by mouth daily.    [provider]  tolterodine (DETROL) 2 MG tablet Take 2 mg by mouth 2 (two)  times daily.     [provider]    Family History Family History  Problem Relation Age of Onset  . Thyroid disease Mother   . Diabetes Father   . Hypertension Father   . Heart disease Father   . Heart attack Father   . Cancer Maternal Aunt        BRAIN  . Cancer Maternal Grandmother        LYMPHOMA  . Hypertension Maternal Grandmother   . Cancer Paternal Grandmother  PANCREATIC  . Diabetes Paternal Grandmother   . Hypertension Paternal Grandmother   . Hypertension Maternal Grandfather   . Hypertension Paternal Grandfather     Social History Social History   Tobacco Use  . Smoking status: Never Smoker  . Smokeless tobacco: Never Used  Substance Use Topics  . Alcohol use: No  . Drug use: No     Allergies   Aspirin; Codeine; Ibuprofen; Metformin and related; Morphine; and Penicillins   Review of Systems Review of Systems  Respiratory: Positive for cough.   Musculoskeletal: Positive for myalgias.  All other systems reviewed and are negative.    Physical Exam Updated Vital Signs BP 137/85 (BP Location: Left Arm)   Pulse 70   Temp 98.3 F (36.8 C) (Oral)   Resp 18   Ht 5\' 7"  (1.702 m)   Wt 136.1 kg (300 lb)   LMP 09/20/2011   SpO2 99%   BMI 46.99 kg/m   Physical Exam  Constitutional: She is oriented to person, place, and time. She appears well-developed and well-nourished.  HENT:  Head: Normocephalic.  Right Ear: External ear normal.  Left Ear: External ear normal.  Nose: Nose normal.  Mouth/Throat: Oropharynx is clear and moist.  Eyes: Pupils are equal, round, and reactive to light. EOM are normal.  Neck: Normal range of motion.  Cardiovascular: Normal rate and regular rhythm.  Pulmonary/Chest: Effort normal.  Abdominal: She exhibits no distension.  Musculoskeletal: Normal range of motion.  Neurological: She is alert and oriented to person, place, and time.  Psychiatric: She has a normal mood and affect.  Nursing note and vitals  reviewed.    ED Treatments / Results  Labs (all labs ordered are listed, but only abnormal results are displayed) Labs Reviewed - No data to display  EKG None  Radiology No results found.  Procedures Procedures (including critical care time)  Medications Ordered in ED Medications - No data to display   Initial Impression / Assessment and Plan / ED Course  I have reviewed the triage vital signs and the nursing notes.  Pertinent labs & imaging results that were available during my care of the patient were reviewed by me and considered in my medical decision making (see chart for details).     MDM  Pt counseled on viral illness.   Pt advised to follow up with primary MD if symptoms persist past one week   Final Clinical Impressions(s) / ED Diagnoses   Final diagnoses:  Acute upper respiratory infection  Viral URI with cough    ED Discharge Orders    None    An After Visit Summary was printed and given to the patient.    Fransico Meadow, [place] 09/26/17 1541    Fatima Blank, MD 09/28/17 [phone]

## 2017-09-26 NOTE — ED Triage Notes (Addendum)
Patient c/o productive cough, congestion, sore throat since Saturday. Reports one episode of emesis and one episode of diarrhea yesterday. Denies abdominal pain. Reports significant other has similar symptoms. Speaking full sentences without difficulty.

## 2017-09-26 NOTE — Discharge Instructions (Addendum)
Return if any problems.

## 2017-09-28 ENCOUNTER — Encounter (HOSPITAL_COMMUNITY): Payer: Self-pay | Admitting: Emergency Medicine

## 2017-09-28 ENCOUNTER — Emergency Department (HOSPITAL_COMMUNITY)
Admission: EM | Admit: 2017-09-28 | Discharge: 2017-09-28 | Disposition: A | Discharge status: Home / Self Care | Payer: Self-pay | Attending: Emergency Medicine | Admitting: Emergency Medicine

## 2017-09-28 DIAGNOSIS — R0981 Nasal congestion: Secondary | ICD-10-CM | POA: Insufficient documentation

## 2017-09-28 DIAGNOSIS — Z794 Long term (current) use of insulin: Secondary | ICD-10-CM | POA: Insufficient documentation

## 2017-09-28 DIAGNOSIS — Z85850 Personal history of malignant neoplasm of thyroid: Secondary | ICD-10-CM | POA: Insufficient documentation

## 2017-09-28 DIAGNOSIS — J069 Acute upper respiratory infection, unspecified: Secondary | ICD-10-CM | POA: Insufficient documentation

## 2017-09-28 DIAGNOSIS — E039 Hypothyroidism, unspecified: Secondary | ICD-10-CM | POA: Insufficient documentation

## 2017-09-28 DIAGNOSIS — Z79899 Other long term (current) drug therapy: Secondary | ICD-10-CM | POA: Insufficient documentation

## 2017-09-28 DIAGNOSIS — M7918 Myalgia, other site: Secondary | ICD-10-CM | POA: Insufficient documentation

## 2017-09-28 DIAGNOSIS — E119 Type 2 diabetes mellitus without complications: Secondary | ICD-10-CM | POA: Insufficient documentation

## 2017-09-28 DIAGNOSIS — I10 Essential (primary) hypertension: Secondary | ICD-10-CM | POA: Insufficient documentation

## 2017-09-28 NOTE — Discharge Instructions (Addendum)
Take Robitussin for cough and use a cool mist vaporizer. Follow up with your health care provider. Return here as needed.

## 2017-09-28 NOTE — ED Triage Notes (Signed)
Patient c/o cough and sore throat since Saturday. Was diagnosed with URI on 4/16. Pt requesting an extended work note due to symptoms still persisting.

## 2017-09-28 NOTE — ED Provider Notes (Signed)
[facility] DEPT Provider Note   CSN: [id number] Arrival date & time: 09/28/17  4132     History   Chief Complaint Chief Complaint  Patient presents with  . Work note    HPI Lynn Martinez is a 47 y.o. female who presents to the ED with URI symptoms. Patient was evaluated for same 09/26/17. Patient reports that she is feeling better but continues to have congestion and cough and does not feel she can return to work yet. Patient had gotten work note to return today.   HPI  Past Medical History:  Diagnosis Date  . Anemia   . Anxiety   . Arthritis    "knees" (09/30/2015)  . Bipolar disorder ([place])   . Depression   . Fibroid    s/p myomectomy 2010, hysterectomy 2013  . Grave's disease   . Heart murmur   . Hormone disorder   . Hypertension   . Hypothyroidism   . 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 Migraine     otc med prn  . PCOS (polycystic ovarian syndrome)   . PMS (premenstrual syndrome)   . PTSD (post-traumatic stress disorder)   . Seasonal allergies   . Thyroid cancer ([place]) 2005  . Type II diabetes mellitus ([place])   . Vertigo     Patient Active Problem List   Diagnosis Date Noted  . Pain in the chest 09/30/2015  . Hypertension 09/30/2015  . Insulin dependent diabetes mellitus ([place]) 09/30/2015  . Chest pain 09/30/2015  . Cholelithiasis with acute cholecystitis 09/05/2013  . Borderline behavior 03/14/2012    Class: Chronic  . OCD (obsessive compulsive disorder) 03/14/2012    Class: Chronic  . PTSD (post-traumatic stress disorder) 03/14/2012    Class: Chronic  . 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 Lumbago without sciatica 03/12/2012  . Major depression, recurrent ([place]) 03/08/2012    Class: Chronic  . Snoring 12/02/2011  . PCOS (polycystic ovarian syndrome) 09/05/2011  . Hypothyroidism 08/13/2010  . VITAMIN D DEFICIENCY 08/13/2010  . ANEMIA-NOS 08/13/2010      Past Surgical History:  Procedure Laterality Date  . ABDOMINAL HYSTERECTOMY  10/11/2011   Procedure: HYSTERECTOMY ABDOMINAL;  Surgeon: Terrance Mass, MD;  Location: [facility];  Service: Gynecology;  Laterality: N/A;  With Repair of serosa.  . CHOLECYSTECTOMY N/A 09/06/2013   Procedure: LAPAROSCOPIC CHOLECYSTECTOMY WITH INTRAOPERATIVE CHOLANGIOGRAM;  Surgeon: Merrie Roof, MD;  Location: [facility];  Service: General;  Laterality: N/A;  . DIAGNOSTIC LAPAROSCOPY    . DILATION AND CURETTAGE OF UTERUS    . HERNIA REPAIR    . MYOMECTOMY  2010   LAPAROSCOPY  . TOTAL THYROIDECTOMY  2006  . UMBILICAL HERNIA REPAIR  1982     OB History    Gravida  2   Para  0   Term      Preterm      AB  2   Living  0     SAB  2   TAB      Ectopic      Multiple      Live Births               Home Medications    Prior to Admission medications   Medication Sig Start Date End Date Taking? Authorizing Provider  cholecalciferol (VITAMIN D) 1000 UNITS tablet Take 2,000 Units by mouth daily.  03/14/12  Yes Darrol Jump, MD  cloNIDine (CATAPRES) 0.1 MG tablet Take 0.1 mg by mouth 2 (two) times daily. 08/24/17  Yes [provider]  clotrimazole (LOTRIMIN) 1 % external solution Apply 1 application topically 2 (two) times daily. In between toes 09/14/15  Yes Stover, Titorya, DPM  docusate sodium (COLACE) 100 MG capsule Take 2 capsules (200 mg total) by mouth every morning. For stool softner Patient taking differently: Take 100 mg by mouth at bedtime. For stool softner 03/14/12  Yes Darrol Jump, MD  ferrous sulfate 325 (65 FE) MG tablet Take 1 tablet (325 mg total) by mouth daily with breakfast. For low iron Patient taking differently: Take 325 mg by mouth 2 (two) times daily with a meal. For low iron 03/14/12  Yes Darrol Jump, MD  glipiZIDE (GLUCOTROL) 10 MG tablet Take 10 mg by mouth 3 (three) times daily.    Yes [provider]  insulin glargine (LANTUS) 100 UNIT/ML  injection Inject 80 Units into the skin at bedtime.    Yes [provider]  levothyroxine (SYNTHROID, LEVOTHROID) 125 MCG tablet Take 112 mcg by mouth daily before breakfast.    Yes [provider]  lithium carbonate 300 MG capsule Take 600 mg by mouth at bedtime.    Yes [provider]  lurasidone (LATUDA) 80 MG TABS tablet Take 80 mg by mouth at bedtime.   Yes [provider]  prazosin (MINIPRESS) 1 MG capsule Take 1 capsule (1 mg total) by mouth at bedtime and may repeat dose one time if needed. For sleep/nightmares 03/14/12  Yes Darrol Jump, MD  propranolol (INDERAL) 40 MG tablet Take 40 mg by mouth 2 (two) times daily.    Yes [provider]  sitaGLIPtin (JANUVIA) 100 MG tablet Take 100 mg by mouth daily.   Yes [provider]  tolterodine (DETROL) 2 MG tablet Take 2 mg by mouth 2 (two) times daily.    Yes [provider]  diazepam (VALIUM) 2 MG tablet Take 1 tablet (2 mg total) by mouth 2 (two) times daily. Patient not taking: Reported on 09/28/2017 06/08/17   Ocie Cornfield T, PA-C  HYDROcodone-acetaminophen (NORCO/VICODIN) 5-325 MG tablet Take 1-2 tablets by mouth every 6 (six) hours as needed. Patient not taking: Reported on 09/28/2017 06/09/17   Malvin Johns, MD  lidocaine (LMX) 4 % cream Apply 1 application topically 3 (three) times daily as needed. Patient not taking: Reported on 09/28/2017 09/08/17   Pisciotta, Elmyra Ricks, PA-C    Family History Family History  Problem Relation Age of Onset  . Thyroid disease Mother   . Diabetes Father   . Hypertension Father   . Heart disease Father   . Heart attack Father   . Cancer Maternal Aunt        BRAIN  . Cancer Maternal Grandmother        LYMPHOMA  . Hypertension Maternal Grandmother   . Cancer Paternal Grandmother        PANCREATIC  . Diabetes Paternal Grandmother   . Hypertension Paternal Grandmother   . Hypertension Maternal Grandfather   . Hypertension Paternal  Grandfather     Social History Social History   Tobacco Use  . Smoking status: Never Smoker  . Smokeless tobacco: Never Used  Substance Use Topics  . Alcohol use: No  . Drug use: No     Allergies   Aspirin; Bee venom; Codeine; Ibuprofen; Metformin and related; Morphine; and Penicillins   Review of Systems Review of  Systems  Constitutional: Negative for chills and fever.  HENT: Positive for congestion.   Respiratory: Positive for cough.   Gastrointestinal: Negative for abdominal pain, nausea and vomiting.  Musculoskeletal: Positive for myalgias.  Skin: Negative for rash.  Neurological: Negative for headaches.  Hematological: Negative for adenopathy.  Psychiatric/Behavioral: Negative for confusion.     Physical Exam Updated Vital Signs BP 129/89 (BP Location: Left Arm)   Pulse 66   Temp 98 F (36.7 C) (Oral)   Resp 18   Ht 5\' 7"  (1.702 m)   Wt 136.1 kg (300 lb)   LMP 09/20/2011   SpO2 98%   BMI 46.99 kg/m   Physical Exam  Constitutional: She appears well-developed and well-nourished. No distress.  HENT:  Head: Normocephalic and atraumatic.  Mouth/Throat: Uvula is midline, oropharynx is clear and moist and mucous membranes are normal.  Eyes: EOM are normal.  Neck: Normal range of motion. Neck supple.  Cardiovascular: Normal rate and regular rhythm.  Pulmonary/Chest: Effort normal. No respiratory distress. She has no wheezes. She has no rales.  Musculoskeletal: Normal range of motion.  Neurological: She is alert.  Skin: Skin is warm and dry.  Psychiatric: She has a normal mood and affect. Her behavior is normal.  Nursing note and vitals reviewed.    ED Treatments / Results  Labs (all labs ordered are listed, but only abnormal results are displayed) Labs Reviewed - No data to display  Radiology No results found.  Procedures Procedures (including critical care time)  Medications Ordered in ED Medications - No data to display   Initial  Impression / Assessment and Plan / ED Course  I have reviewed the triage vital signs and the nursing notes. Patients symptoms are consistent with URI, likely viral etiology. Discussed that antibiotics are not indicated for viral infections. Pt will be discharged with symptomatic treatment.  Verbalizes understanding and is agreeable with plan. Pt is hemodynamically stable & in NAD prior to dc. O2 SAT 98% on R/A. Robitussin, cool mist vaporizer, tylenol, f/u with PCP.   Final Clinical Impressions(s) / ED Diagnoses   Final diagnoses:  URI with cough and congestion    ED Discharge Orders    None       Debroah Baller [place], [place] 09/28/17 [place], Julie, MD 10/06/17 [phone]

## 2017-10-01 ENCOUNTER — Encounter (HOSPITAL_COMMUNITY): Payer: Self-pay

## 2017-10-01 ENCOUNTER — Other Ambulatory Visit: Payer: Self-pay

## 2017-10-01 ENCOUNTER — Emergency Department (HOSPITAL_COMMUNITY)
Admission: EM | Admit: 2017-10-01 | Discharge: 2017-10-01 | Disposition: A | Discharge status: Home / Self Care | Payer: Self-pay | Attending: Emergency Medicine | Admitting: Emergency Medicine

## 2017-10-01 DIAGNOSIS — E039 Hypothyroidism, unspecified: Secondary | ICD-10-CM | POA: Insufficient documentation

## 2017-10-01 DIAGNOSIS — Z794 Long term (current) use of insulin: Secondary | ICD-10-CM | POA: Insufficient documentation

## 2017-10-01 DIAGNOSIS — E119 Type 2 diabetes mellitus without complications: Secondary | ICD-10-CM | POA: Insufficient documentation

## 2017-10-01 DIAGNOSIS — R059 Cough, unspecified: Secondary | ICD-10-CM

## 2017-10-01 DIAGNOSIS — R05 Cough: Secondary | ICD-10-CM

## 2017-10-01 DIAGNOSIS — I10 Essential (primary) hypertension: Secondary | ICD-10-CM | POA: Insufficient documentation

## 2017-10-01 DIAGNOSIS — Z79899 Other long term (current) drug therapy: Secondary | ICD-10-CM | POA: Insufficient documentation

## 2017-10-01 DIAGNOSIS — J309 Allergic rhinitis, unspecified: Secondary | ICD-10-CM | POA: Insufficient documentation

## 2017-10-01 MED ORDER — BENZONATATE 100 MG PO CAPS: 100.0000 mg | ORAL_CAPSULE | Freq: Three times a day (TID) | ORAL | 0 refills | Status: DC

## 2017-10-01 MED ORDER — FLUTICASONE PROPIONATE 50 MCG/ACT NA SUSP
2.0000 | Freq: Every day | NASAL | 0 refills | Status: DC
Start: 2017-10-01 — End: 2018-04-13

## 2017-10-01 MED ORDER — LORATADINE 10 MG PO TABS: 10.0000 mg | ORAL_TABLET | Freq: Every day | ORAL | 0 refills | Status: DC

## 2017-10-01 NOTE — ED Notes (Signed)
Pt declined vitals recheck. She stated, "No, honey" when offered.

## 2017-10-01 NOTE — ED Notes (Signed)
Bed: WTR6 Expected date:  Expected time:  Means of arrival:  Comments: 

## 2017-10-01 NOTE — Discharge Instructions (Addendum)
You are given a perception for Flonase for nasal congestion.  You were given benzonatate for your cough.  You are also given Claritin for your allergy symptoms.  I have prescribed a new medication for you today. It is important that when you pick the prescription up you discuss the potential interactions of this medication with other medications you are taking, including over the counter medications, with the pharmacists.   This new medication has potential side effects. Be sure to contact your primary care provider or return to the emergency department if you are experiencing new symptoms that you are unable to tolerate after starting the medication. You need to receive medical evaluation immediately if you start to experience blistering of the skin, rash, swelling, or difficulty breathing as these signs could indicate a more serious medication side effect.

## 2017-10-01 NOTE — ED Triage Notes (Signed)
Pt states sinus are congested. Pt states that she has been taking robutussin without relief. Pt states that she doesn't feel better since being since a few days ago

## 2017-10-01 NOTE — ED Provider Notes (Signed)
[name] DEPT Provider Note   CSN: [id number] Arrival date & time: 10/01/17  1035     History   Chief Complaint Chief Complaint  Patient presents with  . URI  . Cough    HPI Lynn Martinez is a 47 y.o. female.  HPI   Pt is a 47 y/o female who presents with productive cough with yellow/green sputum that has been present for about 1 week. She denies any fevers at home. She reports sneezing, watery eyes, sore throat, rhinorrhea, nasal congestion, sinus pressure, sinus pain, bilat fullness. She denies ear pain.  No chest pain or shortness of breath.  No abdominal pain, nausea, vomiting, diarrhea.  No difficulty swallowing liquids or solids at home.  had tried taking robitussin with temporary relief.   Past Medical History:  Diagnosis Date  . Anemia   . Anxiety   . Arthritis    "knees" (09/30/2015)  . Bipolar disorder ([place])   . Depression   . Fibroid    s/p myomectomy 2010, hysterectomy 2013  . Grave's disease   . Heart murmur   . Hormone disorder   . Hypertension   . Hypothyroidism   . 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 Migraine     otc med prn  . PCOS (polycystic ovarian syndrome)   . PMS (premenstrual syndrome)   . PTSD (post-traumatic stress disorder)   . Seasonal allergies   . Thyroid cancer ([place]) 2005  . Type II diabetes mellitus ([place])   . Vertigo     Patient Active Problem List   Diagnosis Date Noted  . Pain in the chest 09/30/2015  . Hypertension 09/30/2015  . Insulin dependent diabetes mellitus ([place]) 09/30/2015  . Chest pain 09/30/2015  . Cholelithiasis with acute cholecystitis 09/05/2013  . Borderline behavior 03/14/2012    Class: Chronic  . OCD (obsessive compulsive disorder) 03/14/2012    Class: Chronic  . PTSD (post-traumatic stress disorder) 03/14/2012    Class: Chronic  . 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 Lumbago without sciatica  03/12/2012  . Major depression, recurrent ([place]) 03/08/2012    Class: Chronic  . Snoring 12/02/2011  . PCOS (polycystic ovarian syndrome) 09/05/2011  . Hypothyroidism 08/13/2010  . VITAMIN D DEFICIENCY 08/13/2010  . ANEMIA-NOS 08/13/2010    Past Surgical History:  Procedure Laterality Date  . ABDOMINAL HYSTERECTOMY  10/11/2011   Procedure: HYSTERECTOMY ABDOMINAL;  Surgeon: Terrance Mass, MD;  Location: [facility];  Service: Gynecology;  Laterality: N/A;  With Repair of serosa.  . CHOLECYSTECTOMY N/A 09/06/2013   Procedure: LAPAROSCOPIC CHOLECYSTECTOMY WITH INTRAOPERATIVE CHOLANGIOGRAM;  Surgeon: Merrie Roof, MD;  Location: [facility];  Service: General;  Laterality: N/A;  . DIAGNOSTIC LAPAROSCOPY    . DILATION AND CURETTAGE OF UTERUS    . HERNIA REPAIR    . MYOMECTOMY  2010   LAPAROSCOPY  . TOTAL THYROIDECTOMY  2006  . UMBILICAL HERNIA REPAIR  1982     OB History    Gravida  2   Para  0   Term      Preterm      AB  2   Living  0     SAB  2   TAB      Ectopic      Multiple      Live Births  Home Medications    Prior to Admission medications   Medication Sig Start Date End Date Taking? Authorizing Provider  benzonatate (TESSALON) 100 MG capsule Take 1 capsule (100 mg total) by mouth every 8 (eight) hours. 10/01/17   Kyley Solow S, PA-C  cholecalciferol (VITAMIN D) 1000 UNITS tablet Take 2,000 Units by mouth daily.  03/14/12   Darrol Jump, MD  cloNIDine (CATAPRES) 0.1 MG tablet Take 0.1 mg by mouth 2 (two) times daily. 08/24/17   [provider]  clotrimazole (LOTRIMIN) 1 % external solution Apply 1 application topically 2 (two) times daily. In between toes 09/14/15   Stover, Lady Saucier, DPM  diazepam (VALIUM) 2 MG tablet Take 1 tablet (2 mg total) by mouth 2 (two) times daily. Patient not taking: Reported on 09/28/2017 06/08/17   Ocie Cornfield T, PA-C  docusate sodium (COLACE) 100 MG capsule Take 2 capsules (200 mg total) by mouth  every morning. For stool softner Patient taking differently: Take 100 mg by mouth at bedtime. For stool softner 03/14/12   Darrol Jump, MD  ferrous sulfate 325 (65 FE) MG tablet Take 1 tablet (325 mg total) by mouth daily with breakfast. For low iron Patient taking differently: Take 325 mg by mouth 2 (two) times daily with a meal. For low iron 03/14/12   Darrol Jump, MD  fluticasone (FLONASE) 50 MCG/ACT nasal spray Place 2 sprays into both nostrils daily. 10/01/17   Shawnta Schlegel S, PA-C  glipiZIDE (GLUCOTROL) 10 MG tablet Take 10 mg by mouth 3 (three) times daily.     [provider]  HYDROcodone-acetaminophen (NORCO/VICODIN) 5-325 MG tablet Take 1-2 tablets by mouth every 6 (six) hours as needed. Patient not taking: Reported on 09/28/2017 06/09/17   Malvin Johns, MD  insulin glargine (LANTUS) 100 UNIT/ML injection Inject 80 Units into the skin at bedtime.     [provider]  levothyroxine (SYNTHROID, LEVOTHROID) 125 MCG tablet Take 112 mcg by mouth daily before breakfast.     [provider]  lidocaine (LMX) 4 % cream Apply 1 application topically 3 (three) times daily as needed. Patient not taking: Reported on 09/28/2017 09/08/17   Pisciotta, Elmyra Ricks, PA-C  lithium carbonate 300 MG capsule Take 600 mg by mouth at bedtime.     [provider]  loratadine (CLARITIN) 10 MG tablet Take 1 tablet (10 mg total) by mouth daily. 10/01/17 10/31/17  Riyanshi Wahab S, PA-C  lurasidone (LATUDA) 80 MG TABS tablet Take 80 mg by mouth at bedtime.    [provider]  prazosin (MINIPRESS) 1 MG capsule Take 1 capsule (1 mg total) by mouth at bedtime and may repeat dose one time if needed. For sleep/nightmares 03/14/12   Darrol Jump, MD  propranolol (INDERAL) 40 MG tablet Take 40 mg by mouth 2 (two) times daily.     [provider]  sitaGLIPtin (JANUVIA) 100 MG tablet Take 100 mg by mouth daily.    [provider]  tolterodine (DETROL) 2 MG  tablet Take 2 mg by mouth 2 (two) times daily.     [provider]    Family History Family History  Problem Relation Age of Onset  . Thyroid disease Mother   . Diabetes Father   . Hypertension Father   . Heart disease Father   . Heart attack Father   . Cancer Maternal Aunt        BRAIN  . Cancer Maternal Grandmother        LYMPHOMA  .  Hypertension Maternal Grandmother   . Cancer Paternal Grandmother        PANCREATIC  . Diabetes Paternal Grandmother   . Hypertension Paternal Grandmother   . Hypertension Maternal Grandfather   . Hypertension Paternal Grandfather     Social History Social History   Tobacco Use  . Smoking status: Never Smoker  . Smokeless tobacco: Never Used  Substance Use Topics  . Alcohol use: No  . Drug use: No     Allergies   Aspirin; Bee venom; Codeine; Ibuprofen; Metformin and related; Morphine; and Penicillins   Review of Systems Review of Systems  Constitutional: Negative for fever.  HENT: Positive for congestion, rhinorrhea, sinus pressure, sinus pain, sneezing and sore throat. Negative for ear pain and trouble swallowing.   Eyes:       Watery eyes  Respiratory: Positive for cough. Negative for shortness of breath.   Cardiovascular: Negative for chest pain.  Gastrointestinal: Positive for abdominal pain. Negative for constipation, diarrhea, nausea and vomiting.  Genitourinary: Negative for flank pain and hematuria.  Musculoskeletal: Negative for neck pain.  Skin: Negative for wound.  Neurological: Negative for dizziness, light-headedness and headaches.     Physical Exam Updated Vital Signs BP 135/77 (BP Location: Right Arm)   Pulse 66   Temp 98.1 F (36.7 C) (Oral)   Resp 17   Ht 5\' 7"  (1.702 m)   Wt 136.1 kg (300 lb)   LMP 09/20/2011   SpO2 98%   BMI 46.99 kg/m   Physical Exam  Constitutional: She appears well-developed and well-nourished. No distress.  HENT:  Head: Normocephalic and atraumatic.  Right Ear:  External ear normal.  Left Ear: External ear normal.  Bilateral TMs without erythema.  There are bulging with clear effusions bilaterally.  No pharyngeal erythema.  No tonsillar swelling or exudates.  Uvula midline, no evidence of PTA or retropharyngeal abscess.  Airway patent tolerating secretions.  Nasal turbinates enlarged bilaterally.  No frontal or maxillary sinus tenderness.  Eyes: Pupils are equal, round, and reactive to light. Conjunctivae and EOM are normal.  Neck: Normal range of motion. Neck supple.  Cardiovascular: Normal rate, regular rhythm and normal heart sounds.  No murmur heard. Pulmonary/Chest: Effort normal and breath sounds normal. No respiratory distress. She has no wheezes.  Abdominal: Soft. Bowel sounds are normal. She exhibits no distension. There is no tenderness.  Musculoskeletal: Normal range of motion.  Lymphadenopathy:    She has no cervical adenopathy.  Neurological: She is alert.  Skin: Skin is warm and dry. Capillary refill takes less than 2 seconds.  Psychiatric: She has a normal mood and affect.  Nursing note and vitals reviewed.    ED Treatments / Results  Labs (all labs ordered are listed, but only abnormal results are displayed) Labs Reviewed - No data to display  EKG None  Radiology No results found.  Procedures Procedures (including critical care time)  Medications Ordered in ED Medications - No data to display   Initial Impression / Assessment and Plan / ED Course  I have reviewed the triage vital signs and the nursing notes.  Pertinent labs & imaging results that were available during my care of the patient were reviewed by me and considered in my medical decision making (see chart for details).    Final Clinical Impressions(s) / ED Diagnoses   Final diagnoses:  Allergic rhinitis, unspecified seasonality, unspecified trigger  Cough   Patient presenting with continued URI symptoms versus seasonal allergies.  Chest x-ray not  obtained  as patient's lung sounds are clear and she has been afebrile.  Do not suspect bacterial sinusitis as symptoms only present for 1 week according to patient and she has been afebrile.  She also has no maxillary or frontal sinus tenderness.  Discussed antibiotics are not indicated at this time will give symptomatic treatment with Flonase, Claritin, cough medication.  Patient is requesting a day off work.  Advised to follow-up with her PCP and gave strict return precautions for new or worsening symptoms.  Verbalizes understanding and is agreeable with plan. Pt is hemodynamically stable & in NAD prior to dc.   ED Discharge Orders        Ordered    loratadine (CLARITIN) 10 MG tablet  Daily     10/01/17 1425    fluticasone (FLONASE) 50 MCG/ACT nasal spray  Daily     10/01/17 1425    benzonatate (TESSALON) 100 MG capsule  Every 8 hours     10/01/17 1425       Jemuel Laursen S, PA-C 10/01/17 1430    Nat Christen, MD 10/04/17 1327

## 2017-12-13 DIAGNOSIS — F3132 Bipolar disorder, current episode depressed, moderate: Secondary | ICD-10-CM | POA: Diagnosis not present

## 2018-01-15 ENCOUNTER — Other Ambulatory Visit: Payer: Self-pay

## 2018-01-15 ENCOUNTER — Emergency Department (HOSPITAL_COMMUNITY)
Admission: EM | Admit: 2018-01-15 | Discharge: 2018-01-15 | Disposition: A | Discharge status: Home / Self Care | Payer: BLUE CROSS/BLUE SHIELD | Attending: Emergency Medicine | Admitting: Emergency Medicine

## 2018-01-15 ENCOUNTER — Encounter (HOSPITAL_COMMUNITY): Payer: Self-pay

## 2018-01-15 DIAGNOSIS — Z794 Long term (current) use of insulin: Secondary | ICD-10-CM | POA: Diagnosis not present

## 2018-01-15 DIAGNOSIS — Z79899 Other long term (current) drug therapy: Secondary | ICD-10-CM | POA: Insufficient documentation

## 2018-01-15 DIAGNOSIS — E119 Type 2 diabetes mellitus without complications: Secondary | ICD-10-CM | POA: Insufficient documentation

## 2018-01-15 DIAGNOSIS — M546 Pain in thoracic spine: Secondary | ICD-10-CM | POA: Diagnosis not present

## 2018-01-15 DIAGNOSIS — E039 Hypothyroidism, unspecified: Secondary | ICD-10-CM | POA: Diagnosis not present

## 2018-01-15 DIAGNOSIS — M5489 Other dorsalgia: Secondary | ICD-10-CM | POA: Insufficient documentation

## 2018-01-15 DIAGNOSIS — R1084 Generalized abdominal pain: Secondary | ICD-10-CM | POA: Insufficient documentation

## 2018-01-15 DIAGNOSIS — I10 Essential (primary) hypertension: Secondary | ICD-10-CM | POA: Insufficient documentation

## 2018-01-15 LAB — URINALYSIS, ROUTINE W REFLEX MICROSCOPIC
Bilirubin Urine: NEGATIVE
Glucose, UA: NEGATIVE mg/dL
Hgb urine dipstick: NEGATIVE
Ketones, ur: NEGATIVE mg/dL
Leukocytes, UA: NEGATIVE
Nitrite: NEGATIVE
Protein, ur: NEGATIVE mg/dL
Specific Gravity, Urine: 1.015 (ref 1.005–1.030)
pH: 6 (ref 5.0–8.0)

## 2018-01-15 MED ORDER — MELOXICAM 7.5 MG PO TABS: 7.5000 mg | ORAL_TABLET | Freq: Every day | ORAL | 0 refills | Status: DC | PRN

## 2018-01-15 MED ORDER — TRAMADOL HCL 50 MG PO TABS: 50.0000 mg | ORAL_TABLET | Freq: Three times a day (TID) | ORAL | 0 refills | Status: DC | PRN

## 2018-01-15 NOTE — ED Provider Notes (Signed)
[facility] DEPT Provider Note   CSN: [id number] Arrival date & time: 01/15/18  1103     History   Chief Complaint Chief Complaint  Patient presents with  . Flank Pain    HPI Lynn Martinez is a 47 y.o. female.  HPI   47 year old female with right mid back to right flank pain.  Chronic in nature but worse over the past few days.  She takes Robaxin for which provides her some temporary relief.  Pain is worse with movements.  No urinary complaints.  No respiratory complaints.  No rash.  No fevers or chills.  Past Medical History:  Diagnosis Date  . Anemia   . Anxiety   . Arthritis    "knees" (09/30/2015)  . Bipolar disorder ([place])   . Depression   . Fibroid    s/p myomectomy 2010, hysterectomy 2013  . Grave's disease   . Heart murmur   . Hormone disorder   . Hypertension   . Hypothyroidism   . 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 Migraine     otc med prn  . PCOS (polycystic ovarian syndrome)   . PMS (premenstrual syndrome)   . PTSD (post-traumatic stress disorder)   . Seasonal allergies   . Thyroid cancer ([place]) 2005  . Type II diabetes mellitus ([place])   . Vertigo     Patient Active Problem List   Diagnosis Date Noted  . Pain in the chest 09/30/2015  . Hypertension 09/30/2015  . Insulin dependent diabetes mellitus ([place]) 09/30/2015  . Chest pain 09/30/2015  . Cholelithiasis with acute cholecystitis 09/05/2013  . Borderline behavior 03/14/2012    Class: Chronic  . OCD (obsessive compulsive disorder) 03/14/2012    Class: Chronic  . PTSD (post-traumatic stress disorder) 03/14/2012    Class: Chronic  . 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 Lumbago without sciatica 03/12/2012  . Major depression, recurrent ([place]) 03/08/2012    Class: Chronic  . Snoring 12/02/2011  . PCOS (polycystic ovarian syndrome) 09/05/2011  . Hypothyroidism 08/13/2010  . VITAMIN D DEFICIENCY  08/13/2010  . ANEMIA-NOS 08/13/2010    Past Surgical History:  Procedure Laterality Date  . ABDOMINAL HYSTERECTOMY  10/11/2011   Procedure: HYSTERECTOMY ABDOMINAL;  Surgeon: Terrance Mass, MD;  Location: [facility];  Service: Gynecology;  Laterality: N/A;  With Repair of serosa.  . CHOLECYSTECTOMY N/A 09/06/2013   Procedure: LAPAROSCOPIC CHOLECYSTECTOMY WITH INTRAOPERATIVE CHOLANGIOGRAM;  Surgeon: Merrie Roof, MD;  Location: [facility];  Service: General;  Laterality: N/A;  . DIAGNOSTIC LAPAROSCOPY    . DILATION AND CURETTAGE OF UTERUS    . HERNIA REPAIR    . MYOMECTOMY  2010   LAPAROSCOPY  . TOTAL THYROIDECTOMY  2006  . UMBILICAL HERNIA REPAIR  1982     OB History    Gravida  2   Para  0   Term      Preterm      AB  2   Living  0     SAB  2   TAB      Ectopic      Multiple      Live Births               Home Medications    Prior to Admission medications   Medication Sig Start Date End Date Taking? Authorizing Provider  benzonatate (TESSALON) 100 MG capsule Take 1  capsule (100 mg total) by mouth every 8 (eight) hours. 10/01/17   Couture, Cortni S, PA-C  cholecalciferol (VITAMIN D) 1000 UNITS tablet Take 2,000 Units by mouth daily.  03/14/12   Darrol Jump, MD  cloNIDine (CATAPRES) 0.1 MG tablet Take 0.1 mg by mouth 2 (two) times daily. 08/24/17   [provider]  clotrimazole (LOTRIMIN) 1 % external solution Apply 1 application topically 2 (two) times daily. In between toes 09/14/15   Stover, Lady Saucier, DPM  diazepam (VALIUM) 2 MG tablet Take 1 tablet (2 mg total) by mouth 2 (two) times daily. Patient not taking: Reported on 09/28/2017 06/08/17   Ocie Cornfield T, PA-C  docusate sodium (COLACE) 100 MG capsule Take 2 capsules (200 mg total) by mouth every morning. For stool softner Patient taking differently: Take 100 mg by mouth at bedtime. For stool softner 03/14/12   Darrol Jump, MD  ferrous sulfate 325 (65 FE) MG tablet Take 1 tablet (325 mg  total) by mouth daily with breakfast. For low iron Patient taking differently: Take 325 mg by mouth 2 (two) times daily with a meal. For low iron 03/14/12   Darrol Jump, MD  fluticasone (FLONASE) 50 MCG/ACT nasal spray Place 2 sprays into both nostrils daily. 10/01/17   Couture, Cortni S, PA-C  glipiZIDE (GLUCOTROL) 10 MG tablet Take 10 mg by mouth 3 (three) times daily.     [provider]  HYDROcodone-acetaminophen (NORCO/VICODIN) 5-325 MG tablet Take 1-2 tablets by mouth every 6 (six) hours as needed. Patient not taking: Reported on 09/28/2017 06/09/17   Malvin Johns, MD  insulin glargine (LANTUS) 100 UNIT/ML injection Inject 80 Units into the skin at bedtime.     [provider]  levothyroxine (SYNTHROID, LEVOTHROID) 125 MCG tablet Take 112 mcg by mouth daily before breakfast.     [provider]  lidocaine (LMX) 4 % cream Apply 1 application topically 3 (three) times daily as needed. Patient not taking: Reported on 09/28/2017 09/08/17   Pisciotta, Elmyra Ricks, PA-C  lithium carbonate 300 MG capsule Take 600 mg by mouth at bedtime.     [provider]  loratadine (CLARITIN) 10 MG tablet Take 1 tablet (10 mg total) by mouth daily. 10/01/17 10/31/17  Couture, Cortni S, PA-C  lurasidone (LATUDA) 80 MG TABS tablet Take 80 mg by mouth at bedtime.    [provider]  meloxicam (MOBIC) 7.5 MG tablet Take 1 tablet (7.5 mg total) by mouth daily as needed for pain. 01/15/18   Virgel Manifold, MD  prazosin (MINIPRESS) 1 MG capsule Take 1 capsule (1 mg total) by mouth at bedtime and may repeat dose one time if needed. For sleep/nightmares 03/14/12   Darrol Jump, MD  propranolol (INDERAL) 40 MG tablet Take 40 mg by mouth 2 (two) times daily.     [provider]  sitaGLIPtin (JANUVIA) 100 MG tablet Take 100 mg by mouth daily.    [provider]  tolterodine (DETROL) 2 MG tablet Take 2 mg by mouth 2 (two) times daily.     [provider]    traMADol (ULTRAM) 50 MG tablet Take 1 tablet (50 mg total) by mouth every 8 (eight) hours as needed. 01/15/18   Virgel Manifold, MD    Family History Family History  Problem Relation Age of Onset  . Thyroid disease Mother   . Diabetes Father   . Hypertension Father   . Heart disease Father   . Heart attack Father   . Cancer  Maternal Aunt        BRAIN  . Cancer Maternal Grandmother        LYMPHOMA  . Hypertension Maternal Grandmother   . Cancer Paternal Grandmother        PANCREATIC  . Diabetes Paternal Grandmother   . Hypertension Paternal Grandmother   . Hypertension Maternal Grandfather   . Hypertension Paternal Grandfather     Social History Social History   Tobacco Use  . Smoking status: Never Smoker  . Smokeless tobacco: Never Used  Substance Use Topics  . Alcohol use: No  . Drug use: No     Allergies   Aspirin; Bee venom; Codeine; Ibuprofen; Metformin and related; Morphine; and Penicillins   Review of Systems Review of Systems  All systems reviewed and negative, other than as noted in HPI.  Physical Exam Updated Vital Signs BP 130/75 (BP Location: Left Arm)   Pulse (!) 56   Temp 98.2 F (36.8 C) (Oral)   Resp 12   Ht 5\' 10"  (1.778 m)   Wt 136.1 kg (300 lb)   LMP 09/20/2011   SpO2 99%   BMI 43.05 kg/m   Physical Exam  Constitutional: She appears well-developed and well-nourished. No distress.  HENT:  Head: Normocephalic and atraumatic.  Eyes: Conjunctivae are normal. Right eye exhibits no discharge. Left eye exhibits no discharge.  Neck: Neck supple.  Cardiovascular: Normal rate, regular rhythm and normal heart sounds. Exam reveals no gallop and no friction rub.  No murmur heard. Pulmonary/Chest: Effort normal and breath sounds normal. No respiratory distress.  Abdominal: Soft. She exhibits no distension. There is no tenderness.  Musculoskeletal: She exhibits no edema or tenderness.  Tenderness to palpation in the right mid to lower thoracic  back.  No midline spinal tenderness.  No overlying skin lesions.  Strength is normal bilateral lower extremities.  Sensation intact light touch.  Lungs are clear bilaterally with good air movement.  Neurological: She is alert.  Skin: Skin is warm and dry.  Psychiatric: She has a normal mood and affect. Her behavior is normal. Thought content normal.  Nursing note and vitals reviewed.    ED Treatments / Results  Labs (all labs ordered are listed, but only abnormal results are displayed) Labs Reviewed  URINALYSIS, ROUTINE W REFLEX MICROSCOPIC    EKG None  Radiology No results found.  Procedures Procedures (including critical care time)  Medications Ordered in ED Medications - No data to display   Initial Impression / Assessment and Plan / ED Course  I have reviewed the triage vital signs and the nursing notes.  Pertinent labs & imaging results that were available during my care of the patient were reviewed by me and considered in my medical decision making (see chart for details).     Likely MSK back pain. Worse with palpation and bending/twisting. No urinary complaints. No neurological complaints. No trauma. Abdominal exam benign.   Final Clinical Impressions(s) / ED Diagnoses   Final diagnoses:  Right-sided thoracic back pain, unspecified chronicity    ED Discharge Orders        Ordered    meloxicam (MOBIC) 7.5 MG tablet  Daily PRN     01/15/18 1252    traMADol (ULTRAM) 50 MG tablet  Every 8 hours PRN     01/15/18 1252       Virgel Manifold, MD 01/18/18 1646

## 2018-01-15 NOTE — ED Triage Notes (Signed)
Pt states she I shaving right sided flank pain. Pt states she tried to take robaxin, but she only gets 2 hours of relief with that.

## 2018-01-24 ENCOUNTER — Emergency Department (HOSPITAL_COMMUNITY)
Admission: EM | Admit: 2018-01-24 | Discharge: 2018-01-24 | Disposition: A | Discharge status: Home / Self Care | Payer: Self-pay | Attending: Emergency Medicine | Admitting: Emergency Medicine

## 2018-01-24 ENCOUNTER — Encounter (HOSPITAL_COMMUNITY): Payer: Self-pay | Admitting: Emergency Medicine

## 2018-01-24 ENCOUNTER — Other Ambulatory Visit: Payer: Self-pay

## 2018-01-24 DIAGNOSIS — M545 Low back pain: Secondary | ICD-10-CM | POA: Diagnosis not present

## 2018-01-24 DIAGNOSIS — G8929 Other chronic pain: Secondary | ICD-10-CM | POA: Insufficient documentation

## 2018-01-24 DIAGNOSIS — I10 Essential (primary) hypertension: Secondary | ICD-10-CM | POA: Insufficient documentation

## 2018-01-24 DIAGNOSIS — E119 Type 2 diabetes mellitus without complications: Secondary | ICD-10-CM | POA: Insufficient documentation

## 2018-01-24 HISTORY — DX: Disorder of thyroid, unspecified: E07.9

## 2018-01-24 LAB — URINALYSIS, ROUTINE W REFLEX MICROSCOPIC
Bilirubin Urine: NEGATIVE
Glucose, UA: 50 mg/dL — AB
Hgb urine dipstick: NEGATIVE
Ketones, ur: NEGATIVE mg/dL
LEUKOCYTES UA: NEGATIVE
Nitrite: NEGATIVE
PROTEIN: NEGATIVE mg/dL
Specific Gravity, Urine: 1.014 (ref 1.005–1.030)
pH: 7 (ref 5.0–8.0)

## 2018-01-24 MED ORDER — LIDOCAINE 5 % EX PTCH: 1.0000 | MEDICATED_PATCH | CUTANEOUS | 0 refills | Status: DC

## 2018-01-24 MED ORDER — ACETAMINOPHEN 500 MG PO TABS
1000.0000 mg | ORAL_TABLET | Freq: Once | ORAL | Status: AC
  Administered 2018-01-24: 1000 mg via ORAL
  Filled 2018-01-24: qty 2

## 2018-01-24 MED ORDER — METHOCARBAMOL 750 MG PO TABS: 750.0000 mg | ORAL_TABLET | Freq: Four times a day (QID) | ORAL | 0 refills | Status: AC | PRN

## 2018-01-24 NOTE — ED Notes (Signed)
EDP aware of pts elevated BP. Pt advised to continue her BP medication and to follow up with PCP.

## 2018-01-24 NOTE — ED Provider Notes (Signed)
[facility] DEPT Provider Note   CSN: [id number] Arrival date & time: 01/24/18  1036     History   Chief Complaint Chief Complaint  Patient presents with  . Back Pain    HPI Lynn Martinez is a 47 y.o. female.  HPI  47 year old female with hypertension and diabetes as well as chronic back pain presents with right-sided low back pain.  Pain is been on and off for years but seems to be more persistent currently.  Typically goes away with treatment or time.  However this time is been present for over 1 week.  She was seen here on 8/5 for the same.  She was given tramadol and meloxicam and in addition to her Robaxin she is been taking all of these.  Pain seems to be persistent despite this.  No fevers, weakness in the extremities, numbness in extremities, or new incontinence.  She always has chronic urinary incontinence but this is not new.  No injuries.  Sometimes it radiates around to the right lower abdomen but this is also not a new problem.  She has not noticed any blood in her urine or difficulty urinating.  No vomiting.  She is concerned it might be a kidney problem due to its persistence.  Past Medical History:  Diagnosis Date  . Diabetes mellitus without complication ([place])   . Hypertension   . Thyroid disease     There are no active problems to display for this patient.   Past Surgical History:  Procedure Laterality Date  . ABDOMINAL HYSTERECTOMY    . thyroid removed       OB History   None      Home Medications    Prior to Admission medications   Not on File    Family History No family history on file.  Social History Social History   Tobacco Use  . Smoking status: Never Smoker  . Smokeless tobacco: Never Used  Substance Use Topics  . Alcohol use: Never    Frequency: Never  . Drug use: Never     Allergies   Ibuprofen; Morphine and related; Penicillins; and Asa [aspirin]   Review of Systems Review of  Systems  Constitutional: Negative for fever.  Gastrointestinal: Negative for nausea and vomiting.  Genitourinary: Negative for dysuria and hematuria.  Musculoskeletal: Positive for back pain.  Neurological: Negative for weakness and numbness.  All other systems reviewed and are negative.    Physical Exam Updated Vital Signs BP (!) 147/86 (BP Location: Left Arm)   Pulse 76   Temp 98.6 F (37 C) (Oral)   Resp 18   Ht 5\' 10"  (1.778 m)   Wt 136.1 kg   SpO2 99%   BMI 43.05 kg/m   Physical Exam  Constitutional: She is oriented to person, place, and time. She appears well-developed and well-nourished.  obese  HENT:  Head: Normocephalic and atraumatic.  Right Ear: External ear normal.  Left Ear: External ear normal.  Nose: Nose normal.  Eyes: Right eye exhibits no discharge. Left eye exhibits no discharge.  Cardiovascular: Normal rate, regular rhythm and normal heart sounds.  Pulmonary/Chest: Effort normal and breath sounds normal.  Abdominal: Soft. She exhibits no distension. There is no tenderness.  Musculoskeletal:       Thoracic back: She exhibits no tenderness.       Lumbar back: She exhibits tenderness. She exhibits no bony tenderness.       Back:  Diffuse right lumbar muscular tenderness. No  cva tenderness Mild left sided tenderness No midline, bony tenderness  Neurological: She is alert and oriented to person, place, and time.  5/5 strength in BLE.  Skin: Skin is warm and dry.  Nursing note and vitals reviewed.    ED Treatments / Results  Labs (all labs ordered are listed, but only abnormal results are displayed) Labs Reviewed  URINALYSIS, ROUTINE W REFLEX MICROSCOPIC - Abnormal; Notable for the following components:      Result Value   Glucose, UA 50 (*)    All other components within normal limits    EKG None  Radiology No results found.  Procedures Procedures (including critical care time)  Medications Ordered in ED Medications  acetaminophen  (TYLENOL) tablet 1,000 mg (has no administration in time range)     Initial Impression / Assessment and Plan / ED Course  I have reviewed the triage vital signs and the nursing notes.  Pertinent labs & imaging results that were available during my care of the patient were reviewed by me and considered in my medical decision making (see chart for details).     Patient's pain is in the same area that she has her chronic right-sided back pain.  She states she even typically has pain radiating around to the abdomen.  Abdominal exam is benign.  No weakness or numbness in extremities or new incontinence.  At this point, this appears to be a flare of her chronic back pain.  I do not think she has an emergent retroperitoneal or abdominal emergency.  While she is hypertensive, she has not taken her afternoon medicines.  Pain seems a little bit better.  I do not think labs would be helpful given no vomiting or other acute change.  Her urinalysis shows small amount of glucose but no hematuria or protein.  Thus, I recommended she follow-up with a PCP and continue outpatient treatment of her chronic back pain.  Return precautions.  Final Clinical Impressions(s) / ED Diagnoses   Final diagnoses:  Chronic right-sided low back pain without sciatica    ED Discharge Orders    None       Sherwood Gambler, MD 01/24/18 1507

## 2018-01-24 NOTE — ED Notes (Signed)
Urine specimen along with a culture sample sent to the lab at this time.

## 2018-01-24 NOTE — Discharge Instructions (Signed)
If your back pain worsens, you develop fever, abdominal pain, weakness or numbness in the arms or legs, incontinence of your urine or bowels, or any other new/concerning symptoms or return to the ER for evaluation.

## 2018-01-24 NOTE — ED Triage Notes (Signed)
Patient c/o back pain x2-3 weeks. States she thinks it may be her kidneys. She has been taking muscle relaxer's  and tramadol. The tramadol has helped with pain but pain continues to come back. Pt denies urinary symptoms.

## 2018-02-09 ENCOUNTER — Encounter (HOSPITAL_COMMUNITY): Payer: Self-pay

## 2018-02-21 ENCOUNTER — Emergency Department (HOSPITAL_COMMUNITY): Payer: BLUE CROSS/BLUE SHIELD

## 2018-02-21 ENCOUNTER — Emergency Department (HOSPITAL_COMMUNITY)
Admission: EM | Admit: 2018-02-21 | Discharge: 2018-02-21 | Disposition: A | Discharge status: Home / Self Care | Payer: BLUE CROSS/BLUE SHIELD | Attending: Emergency Medicine | Admitting: Emergency Medicine

## 2018-02-21 ENCOUNTER — Other Ambulatory Visit: Payer: Self-pay

## 2018-02-21 ENCOUNTER — Encounter (HOSPITAL_COMMUNITY): Payer: Self-pay | Admitting: *Deleted

## 2018-02-21 DIAGNOSIS — M546 Pain in thoracic spine: Secondary | ICD-10-CM | POA: Insufficient documentation

## 2018-02-21 DIAGNOSIS — E039 Hypothyroidism, unspecified: Secondary | ICD-10-CM | POA: Insufficient documentation

## 2018-02-21 DIAGNOSIS — E119 Type 2 diabetes mellitus without complications: Secondary | ICD-10-CM | POA: Insufficient documentation

## 2018-02-21 DIAGNOSIS — I10 Essential (primary) hypertension: Secondary | ICD-10-CM | POA: Insufficient documentation

## 2018-02-21 DIAGNOSIS — Z85850 Personal history of malignant neoplasm of thyroid: Secondary | ICD-10-CM | POA: Insufficient documentation

## 2018-02-21 DIAGNOSIS — Z79899 Other long term (current) drug therapy: Secondary | ICD-10-CM | POA: Diagnosis not present

## 2018-02-21 DIAGNOSIS — R109 Unspecified abdominal pain: Secondary | ICD-10-CM | POA: Diagnosis not present

## 2018-02-21 DIAGNOSIS — Z794 Long term (current) use of insulin: Secondary | ICD-10-CM | POA: Insufficient documentation

## 2018-02-21 LAB — URINALYSIS, ROUTINE W REFLEX MICROSCOPIC
Bilirubin Urine: NEGATIVE
Glucose, UA: NEGATIVE mg/dL
Hgb urine dipstick: NEGATIVE
KETONES UR: NEGATIVE mg/dL
Nitrite: POSITIVE — AB
PROTEIN: NEGATIVE mg/dL
Specific Gravity, Urine: 1.016 (ref 1.005–1.030)
pH: 6 (ref 5.0–8.0)

## 2018-02-21 LAB — CBC WITH DIFFERENTIAL/PLATELET
BASOS ABS: 0 10*3/uL (ref 0.0–0.1)
BASOS PCT: 0 %
Eosinophils Absolute: 0.3 10*3/uL (ref 0.0–0.7)
Eosinophils Relative: 3 %
HEMATOCRIT: 35.5 % — AB (ref 36.0–46.0)
HEMOGLOBIN: 11.4 g/dL — AB (ref 12.0–15.0)
Lymphocytes Relative: 33 %
Lymphs Abs: 2.9 10*3/uL (ref 0.7–4.0)
MCH: 26.2 pg (ref 26.0–34.0)
MCHC: 32.1 g/dL (ref 30.0–36.0)
MCV: 81.6 fL (ref 78.0–100.0)
MONO ABS: 0.6 10*3/uL (ref 0.1–1.0)
Monocytes Relative: 7 %
NEUTROS ABS: 4.9 10*3/uL (ref 1.7–7.7)
NEUTROS PCT: 57 %
Platelets: 386 10*3/uL (ref 150–400)
RBC: 4.35 MIL/uL (ref 3.87–5.11)
RDW: 15.3 % (ref 11.5–15.5)
WBC: 8.6 10*3/uL (ref 4.0–10.5)

## 2018-02-21 LAB — COMPREHENSIVE METABOLIC PANEL
ALBUMIN: 3.5 g/dL (ref 3.5–5.0)
ALT: 28 U/L (ref 0–44)
AST: 19 U/L (ref 15–41)
Alkaline Phosphatase: 72 U/L (ref 38–126)
Anion gap: 11 (ref 5–15)
BILIRUBIN TOTAL: 0.4 mg/dL (ref 0.3–1.2)
BUN: 11 mg/dL (ref 6–20)
CO2: 25 mmol/L (ref 22–32)
Calcium: 9.8 mg/dL (ref 8.9–10.3)
Chloride: 104 mmol/L (ref 98–111)
Creatinine, Ser: 0.9 mg/dL (ref 0.44–1.00)
GFR calc Af Amer: >60 mL/min (ref >60)
GFR calc non Af Amer: >60 mL/min (ref >60)
GLUCOSE: 250 mg/dL — AB (ref 70–99)
POTASSIUM: 4.1 mmol/L (ref 3.5–5.1)
Sodium: 140 mmol/L (ref 135–145)
TOTAL PROTEIN: 7.3 g/dL (ref 6.5–8.1)

## 2018-02-21 LAB — LIPASE, BLOOD: Lipase: 30 U/L (ref 11–51)

## 2018-02-21 MED ORDER — HYDROCODONE-ACETAMINOPHEN 5-325 MG PO TABS
2.0000 | ORAL_TABLET | Freq: Once | ORAL | Status: AC
  Administered 2018-02-21: 2 via ORAL
  Filled 2018-02-21: qty 2

## 2018-02-21 MED ORDER — ONDANSETRON 4 MG PO TBDP: 4.0000 mg | ORAL_TABLET | Freq: Four times a day (QID) | ORAL | 0 refills | Status: DC | PRN

## 2018-02-21 MED ORDER — ONDANSETRON 4 MG PO TBDP
4.0000 mg | ORAL_TABLET | Freq: Once | ORAL | Status: AC
  Administered 2018-02-21: 4 mg via ORAL
  Filled 2018-02-21: qty 1

## 2018-02-21 MED ORDER — HYDROCODONE-ACETAMINOPHEN 5-325 MG PO TABS: 1.0000 | ORAL_TABLET | ORAL | 0 refills | Status: DC | PRN

## 2018-02-21 NOTE — Discharge Instructions (Addendum)
Your labs and CT scan today were normal.  Your urine showed possible signs of infection but given you are not having urinary tract infection symptoms, we will hold on antibiotics at this time.  A urine culture has been sent and if this grows bacteria suggestive of a UTI, you will be contacted and started on antibiotic therapy.   To find a primary care or specialty doctor please call [phone] or [phone] to access "[facility]."  You may also go on the [facility] website at [email or web address]  There are also multiple [facility], [facility], [facility] and [facility] practices throughout the [place] that are frequently accepting new patients. You may find a clinic that is close to your home and contact them.  [place] [id number] [place]  [place] [address] [place] [phone]

## 2018-02-21 NOTE — ED Triage Notes (Signed)
Pt c/o right flank pain radiating into groin.  Pt stated "I've been taking the Robaxin I was giving but it's not helping.  They told me to come back if it got worse."  Pt denies n/v.

## 2018-02-21 NOTE — ED Notes (Signed)
ED Provider at bedside. 

## 2018-02-21 NOTE — ED Provider Notes (Signed)
TIME SEEN: 4:41 AM  CHIEF COMPLAINT: Flank pain  HPI: Patient is a 47 year old female with history of hypertension, diabetes, hypothyroidism who presents to the emergency department with right-sided flank pain that radiates into the right part of her abdomen.  States she has had chronic back pain for several weeks.  States that this has progressively worsened and now radiates into the front of her abdomen which is new for her.  She denies any injury to the back.  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  Pain worse with movement and walking.  No numbness, tingling or focal weakness.  No bowel or bladder incontinence.  Denies nausea, vomiting, diarrhea, dysuria or hematuria.  No history of kidney stones.  Has tried Tylenol, lidocaine patches, muscle relaxers without relief.  Is allergic to NSAIDs.  ROS: See HPI Constitutional: no fever  Eyes: no drainage  ENT: no runny nose   Cardiovascular:  no chest pain  Resp: no SOB  GI: no vomiting GU: no dysuria Integumentary: no rash  Allergy: no hives  Musculoskeletal: no leg swelling  Neurological: no slurred speech ROS otherwise negative  PAST MEDICAL HISTORY/PAST SURGICAL HISTORY:  Past Medical History:  Diagnosis Date  . Anemia   . Anxiety   . Arthritis    "knees" (09/30/2015)  . Bipolar disorder ([place])   . Depression   . Diabetes mellitus without complication ([place])   . Fibroid    s/p myomectomy 2010, hysterectomy 2013  . Grave's disease   . Heart murmur   . Hormone disorder   . Hypertension   . Hypothyroidism   . 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 Migraine     otc med prn  . PCOS (polycystic ovarian syndrome)   . PMS (premenstrual syndrome)   . PTSD (post-traumatic stress disorder)   . Seasonal allergies   . Thyroid cancer ([place]) 2005  . Thyroid disease   . Type II diabetes mellitus ([place])   . Vertigo     MEDICATIONS:  Prior to Admission medications    Medication Sig Start Date End Date Taking? Authorizing Provider  cholecalciferol (VITAMIN D) 1000 UNITS tablet Take 2,000 Units by mouth daily.  03/14/12  Yes Darrol Jump, MD  cloNIDine (CATAPRES) 0.1 MG tablet Take 0.1 mg by mouth 2 (two) times daily. 08/24/17  Yes [provider]  docusate sodium (COLACE) 100 MG capsule Take 2 capsules (200 mg total) by mouth every morning. For stool softner Patient taking differently: Take 100 mg by mouth at bedtime. For stool softner 03/14/12  Yes Darrol Jump, MD  ferrous sulfate 325 (65 FE) MG tablet Take 1 tablet (325 mg total) by mouth daily with breakfast. For low iron Patient taking differently: Take 325 mg by mouth 2 (two) times daily with a meal. For low iron 03/14/12  Yes Darrol Jump, MD  fluticasone (FLONASE) 50 MCG/ACT nasal spray Place 2 sprays into both nostrils daily. 10/01/17  Yes Couture, Cortni S, PA-C  glipiZIDE (GLUCOTROL) 10 MG tablet Take 10 mg by mouth daily before breakfast.   Yes [provider]  hydrOXYzine (ATARAX/VISTARIL) 25 MG tablet Take 25 mg by mouth 2 (two) times daily.   Yes [provider]  insulin glargine (LANTUS) 100 UNIT/ML injection Inject 90 Units into the skin at bedtime.   Yes [provider]  levothyroxine (SYNTHROID, LEVOTHROID) 125 MCG tablet Take 112 mcg by mouth daily  before breakfast.    Yes [provider]  lithium carbonate 300 MG capsule Take 600 mg by mouth at bedtime.    Yes [provider]  lurasidone (LATUDA) 80 MG TABS tablet Take 80 mg by mouth at bedtime.   Yes [provider]  prazosin (MINIPRESS) 1 MG capsule Take 1 capsule (1 mg total) by mouth at bedtime and may repeat dose one time if needed. For sleep/nightmares 03/14/12  Yes Darrol Jump, MD  propranolol (INDERAL) 40 MG tablet Take 40 mg by mouth 2 (two) times daily.    Yes [provider]  sitaGLIPtin (JANUVIA) 100 MG tablet Take 100 mg by mouth daily.   Yes  [provider]  tolterodine (DETROL) 2 MG tablet Take 2 mg by mouth daily.    Yes [provider]  benzonatate (TESSALON) 100 MG capsule Take 1 capsule (100 mg total) by mouth every 8 (eight) hours. Patient not taking: Reported on 02/21/2018 10/01/17   Couture, Cortni S, PA-C  clotrimazole (LOTRIMIN) 1 % external solution Apply 1 application topically 2 (two) times daily. In between toes Patient not taking: Reported on 02/21/2018 09/14/15   Landis Martins, DPM  diazepam (VALIUM) 2 MG tablet Take 1 tablet (2 mg total) by mouth 2 (two) times daily. Patient not taking: Reported on 09/28/2017 06/08/17   Ocie Cornfield T, PA-C  HYDROcodone-acetaminophen (NORCO/VICODIN) 5-325 MG tablet Take 1-2 tablets by mouth every 6 (six) hours as needed. Patient not taking: Reported on 09/28/2017 06/09/17   Malvin Johns, MD  levothyroxine (SYNTHROID, LEVOTHROID) 150 MCG tablet Take 1 tablet (150 mcg total) by mouth daily. For thyroid Patient not taking: Reported on 02/21/2018 03/14/12 03/14/13  Darrol Jump, MD  lidocaine (LIDODERM) 5 % Place 1 patch onto the skin daily. Remove & Discard patch within 12 hours or as directed by MD Patient not taking: Reported on 02/21/2018 01/24/18   Sherwood Gambler, MD  lidocaine (LMX) 4 % cream Apply 1 application topically 3 (three) times daily as needed. Patient not taking: Reported on 09/28/2017 09/08/17   Pisciotta, Elmyra Ricks, PA-C  loratadine (CLARITIN) 10 MG tablet Take 1 tablet (10 mg total) by mouth daily. Patient not taking: Reported on 02/21/2018 10/01/17 10/31/17  Couture, Cortni S, PA-C  meloxicam (MOBIC) 7.5 MG tablet Take 1 tablet (7.5 mg total) by mouth daily as needed for pain. Patient not taking: Reported on 02/21/2018 01/15/18   Virgel Manifold, MD  traMADol (ULTRAM) 50 MG tablet Take 1 tablet (50 mg total) by mouth every 8 (eight) hours as needed. Patient not taking: Reported on 02/21/2018 01/15/18   Virgel Manifold, MD    ALLERGIES:  Allergies  Allergen  Reactions  . Aspirin Hives  . Bee Venom   . Codeine Itching    Tolerates Hydrocodone OK.  . Codeine Itching  . Ibuprofen Other (See Comments)    Happened in childhood  . Ibuprofen Hives and Swelling  . Metformin And Related Other (See Comments)    Muscle weakness  . Morphine Other (See Comments)    Throat swelling  . Morphine And Related Itching  . Penicillins Other (See Comments)    Childhood reaction  Has patient had a PCN reaction causing immediate rash, facial/tongue/throat swelling, SOB or lightheadedness with hypotension: unknown Has patient had a PCN reaction causing severe rash involving mucus membranes or skin necrosis: unknown Has patient had a PCN reaction that required hospitalization unknown Has patient had a PCN reaction occurring within the last 10 years: unknown If all  of the above answers are "NO", then may proceed with Cephalosporin use.  Marland Kitchen Penicillins     Childhood Allergy Has patient had a PCN reaction causing immediate rash, facial/tongue/throat swelling, SOB or lightheadedness with hypotension: Unknown Has patient had a PCN reaction causing severe rash involving mucus membranes or skin necrosis: Unknown Has patient had a PCN reaction that required hospitalization: Unknown Has patient had a PCN reaction occurring within the last 10 years: Unknown If all of the above answers are "NO", then may proceed with Cephalosporin use.   Diona Fanti [Aspirin] Swelling and Rash    SOCIAL HISTORY:  Social History   Tobacco Use  . Smoking status: Never Smoker  . Smokeless tobacco: Never Used  Substance Use Topics  . Alcohol use: Never    Frequency: Never    FAMILY HISTORY: Family History  Problem Relation Age of Onset  . Thyroid disease Mother   . Diabetes Father   . Hypertension Father   . Heart disease Father   . Heart attack Father   . Cancer Maternal Aunt        BRAIN  . Cancer Maternal Grandmother        LYMPHOMA  . Hypertension Maternal Grandmother   .  Cancer Paternal Grandmother        PANCREATIC  . Diabetes Paternal Grandmother   . Hypertension Paternal Grandmother   . Hypertension Maternal Grandfather   . Hypertension Paternal Grandfather     EXAM: BP 121/69   Pulse 67   Temp 97.7 F (36.5 C) (Oral)   Resp 18   Ht 5\' 10"  (1.778 m)   Wt 136.1 kg   LMP 09/20/2011   SpO2 100%   BMI 43.05 kg/m  CONSTITUTIONAL: Alert and oriented and responds appropriately to questions. Well-appearing; well-nourished, obese HEAD: Normocephalic EYES: Conjunctivae clear, pupils appear equal, EOMI ENT: normal nose; moist mucous membranes NECK: Supple, no meningismus, no nuchal rigidity, no LAD  CARD: RRR; S1 and S2 appreciated; no murmurs, no clicks, no rubs, no gallops RESP: Normal chest excursion without splinting or tachypnea; breath sounds clear and equal bilaterally; no wheezes, no rhonchi, no rales, no hypoxia or respiratory distress, speaking full sentences ABD/GI: Normal bowel sounds; non-distended; soft, non-tender, no rebound, no guarding, no peritoneal signs, no hepatosplenomegaly BACK:  The back appears normal and is mildly tender over the right lumbar paraspinal muscles but no midline spinal tenderness or step-off or deformity, there is no CVA tenderness EXT: Normal ROM in all joints; non-tender to palpation; no edema; normal capillary refill; no cyanosis, no calf tenderness or swelling    SKIN: Normal color for age and race; warm; no rash NEURO: Moves all extremities equally, normal sensation diffusely, no saddle anesthesia, normal speech PSYCH: The patient's mood and manner are appropriate. Grooming and personal hygiene are appropriate.  MEDICAL DECISION MAKING: Patient here with right-sided flank pain.  Suspect this is musculoskeletal in nature but she does have possible UTI and with pain that radiates into the front of her abdomen I am concerned for possible kidney stone, pyelonephritis.  Will obtain CT scan for further evaluation.   Will treat symptoms with Vicodin, Zofran.  Nothing at this time to suggest cauda equina, spinal stenosis, epidural abscess or hematoma, discitis or osteomyelitis.  Doubt fracture.  Urine culture is pending.  ED PROGRESS: Labs are reassuring.  She currently has no urinary symptoms and urine shows positive nitrites, trace leukocytes and only rare bacteria.  We will send urine culture and hold treatment at  this time which she is comfortable with.  CT scan shows no stone or other acute abnormality.  Suspect musculoskeletal pain.  Will discharge with prescription of Vicodin, Zofran and give her outpatient PCP follow-up.  At this time, I do not feel there is any life-threatening condition present. I have reviewed and discussed all results (EKG, imaging, lab, urine as appropriate) and exam findings with patient/family. I have reviewed nursing notes and appropriate previous records.  I feel the patient is safe to be discharged home without further emergent workup and can continue workup as an outpatient as needed. Discussed usual and customary return precautions. Patient/family verbalize understanding and are comfortable with this plan.  Outpatient follow-up has been provided if needed. All questions have been answered.      Magdelene Ruark, Delice Bison, DO 02/21/18 [phone]

## 2018-02-22 LAB — URINE CULTURE

## 2018-03-06 ENCOUNTER — Encounter (HOSPITAL_COMMUNITY): Payer: Self-pay

## 2018-03-06 ENCOUNTER — Emergency Department (HOSPITAL_COMMUNITY)
Admission: EM | Admit: 2018-03-06 | Discharge: 2018-03-06 | Disposition: A | Discharge status: Home / Self Care | Payer: BLUE CROSS/BLUE SHIELD | Attending: Emergency Medicine | Admitting: Emergency Medicine

## 2018-03-06 ENCOUNTER — Other Ambulatory Visit: Payer: Self-pay

## 2018-03-06 DIAGNOSIS — I10 Essential (primary) hypertension: Secondary | ICD-10-CM | POA: Insufficient documentation

## 2018-03-06 DIAGNOSIS — E119 Type 2 diabetes mellitus without complications: Secondary | ICD-10-CM | POA: Insufficient documentation

## 2018-03-06 DIAGNOSIS — E039 Hypothyroidism, unspecified: Secondary | ICD-10-CM | POA: Diagnosis not present

## 2018-03-06 DIAGNOSIS — R110 Nausea: Secondary | ICD-10-CM | POA: Insufficient documentation

## 2018-03-06 DIAGNOSIS — R42 Dizziness and giddiness: Secondary | ICD-10-CM | POA: Insufficient documentation

## 2018-03-06 DIAGNOSIS — Z794 Long term (current) use of insulin: Secondary | ICD-10-CM | POA: Insufficient documentation

## 2018-03-06 DIAGNOSIS — E1165 Type 2 diabetes mellitus with hyperglycemia: Secondary | ICD-10-CM | POA: Insufficient documentation

## 2018-03-06 DIAGNOSIS — Z79899 Other long term (current) drug therapy: Secondary | ICD-10-CM | POA: Diagnosis not present

## 2018-03-06 DIAGNOSIS — R739 Hyperglycemia, unspecified: Secondary | ICD-10-CM

## 2018-03-06 DIAGNOSIS — F319 Bipolar disorder, unspecified: Secondary | ICD-10-CM | POA: Insufficient documentation

## 2018-03-06 DIAGNOSIS — R51 Headache: Secondary | ICD-10-CM | POA: Diagnosis not present

## 2018-03-06 LAB — CBG MONITORING, ED
GLUCOSE-CAPILLARY: 329 mg/dL — AB (ref 70–99)
Glucose-Capillary: 345 mg/dL — ABNORMAL HIGH (ref 70–99)

## 2018-03-06 LAB — BASIC METABOLIC PANEL
Anion gap: 9 (ref 5–15)
BUN: 10 mg/dL (ref 6–20)
CHLORIDE: 101 mmol/L (ref 98–111)
CO2: 27 mmol/L (ref 22–32)
CREATININE: 0.93 mg/dL (ref 0.44–1.00)
Calcium: 9.7 mg/dL (ref 8.9–10.3)
GFR calc Af Amer: >60 mL/min (ref >60)
GFR calc non Af Amer: >60 mL/min (ref >60)
Glucose, Bld: 339 mg/dL — ABNORMAL HIGH (ref 70–99)
Potassium: 4.1 mmol/L (ref 3.5–5.1)
Sodium: 137 mmol/L (ref 135–145)

## 2018-03-06 LAB — CBC
HCT: 37.1 % (ref 36.0–46.0)
Hemoglobin: 11.9 g/dL — ABNORMAL LOW (ref 12.0–15.0)
MCH: 26.4 pg (ref 26.0–34.0)
MCHC: 32.1 g/dL (ref 30.0–36.0)
MCV: 82.3 fL (ref 78.0–100.0)
PLATELETS: 464 10*3/uL — AB (ref 150–400)
RBC: 4.51 MIL/uL (ref 3.87–5.11)
RDW: 15.2 % (ref 11.5–15.5)
WBC: 11.3 10*3/uL — ABNORMAL HIGH (ref 4.0–10.5)

## 2018-03-06 LAB — URINALYSIS, ROUTINE W REFLEX MICROSCOPIC
Bacteria, UA: NONE SEEN
Bilirubin Urine: NEGATIVE
Glucose, UA: >=500 mg/dL — AB
Hgb urine dipstick: NEGATIVE
KETONES UR: NEGATIVE mg/dL
Leukocytes, UA: NEGATIVE
Nitrite: NEGATIVE
PH: 5 (ref 5.0–8.0)
Protein, ur: NEGATIVE mg/dL
SPECIFIC GRAVITY, URINE: 1.022 (ref 1.005–1.030)

## 2018-03-06 MED ORDER — ACETAMINOPHEN 325 MG PO TABS
650.0000 mg | ORAL_TABLET | Freq: Once | ORAL | Status: AC
  Administered 2018-03-06: 650 mg via ORAL
  Filled 2018-03-06: qty 2

## 2018-03-06 MED ORDER — SODIUM CHLORIDE 0.9 % IV BOLUS
1000.0000 mL | Freq: Once | INTRAVENOUS | Status: AC
  Administered 2018-03-06: 1000 mL via INTRAVENOUS

## 2018-03-06 MED ORDER — METOCLOPRAMIDE HCL 5 MG/ML IJ SOLN
10.0000 mg | Freq: Once | INTRAMUSCULAR | Status: AC
  Administered 2018-03-06: 10 mg via INTRAVENOUS
  Filled 2018-03-06: qty 2

## 2018-03-06 NOTE — ED Notes (Signed)
Unable to give urine sample at this time.  

## 2018-03-06 NOTE — Discharge Instructions (Signed)
Be sure to take your medications, as prescribed. Stay well-hydrated by drinking plenty of water. Follow-up with a primary care provider for any further management of this issue.

## 2018-03-06 NOTE — ED Provider Notes (Signed)
[facility] DEPT Provider Note   CSN: [id number] Arrival date & time: 03/06/18  1535     History   Chief Complaint Chief Complaint  Patient presents with  . Hyperglycemia  . Dizziness  . Nausea  . Headache    HPI Lynn Martinez is a 47 y.o. female.  HPI   Lynn Martinez is a 47 y.o. female, with a history of DM, HTN, and hypothyroidism, presenting to the ED with hyperglycemia for the past few days.  Also endorses fatigue, headache, shakiness, nausea, lightheadedness, and intermittent blurry vision. Has had these issues before with high blood sugar.  She has largely been compliant with her medications. She states she has been "really busy lately" and has not been taking care of herself as well. Denies syncope, fever/chills, shortness of breath, cough, vision loss, neuro deficits, chest pain, abdominal pain, vomiting/diarrhea, or any other complaints.    Past Medical History:  Diagnosis Date  . Anemia   . Anxiety   . Arthritis    "knees" (09/30/2015)  . Bipolar disorder ([place])   . Depression   . Diabetes mellitus without complication ([place])   . Fibroid    s/p myomectomy 2010, hysterectomy 2013  . Grave's disease   . Heart murmur   . Hormone disorder   . Hypertension   . Hypothyroidism   . 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 Migraine     otc med prn  . PCOS (polycystic ovarian syndrome)   . PMS (premenstrual syndrome)   . PTSD (post-traumatic stress disorder)   . Seasonal allergies   . Thyroid cancer ([place]) 2005  . Thyroid disease   . Type II diabetes mellitus ([place])   . Vertigo     Patient Active Problem List   Diagnosis Date Noted  . Pain in the chest 09/30/2015  . Hypertension 09/30/2015  . Insulin dependent diabetes mellitus ([place]) 09/30/2015  . Chest pain 09/30/2015  . Cholelithiasis with acute cholecystitis 09/05/2013  . Borderline behavior 03/14/2012    Class: Chronic  .  OCD (obsessive compulsive disorder) 03/14/2012    Class: Chronic  . PTSD (post-traumatic stress disorder) 03/14/2012    Class: Chronic  . 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 Lumbago without sciatica 03/12/2012  . Major depression, recurrent ([place]) 03/08/2012    Class: Chronic  . Snoring 12/02/2011  . PCOS (polycystic ovarian syndrome) 09/05/2011  . Hypothyroidism 08/13/2010  . VITAMIN D DEFICIENCY 08/13/2010  . ANEMIA-NOS 08/13/2010    Past Surgical History:  Procedure Laterality Date  . ABDOMINAL HYSTERECTOMY  10/11/2011   Procedure: HYSTERECTOMY ABDOMINAL;  Surgeon: Terrance Mass, MD;  Location: [facility];  Service: Gynecology;  Laterality: N/A;  With Repair of serosa.  . ABDOMINAL HYSTERECTOMY    . CHOLECYSTECTOMY N/A 09/06/2013   Procedure: LAPAROSCOPIC CHOLECYSTECTOMY WITH INTRAOPERATIVE CHOLANGIOGRAM;  Surgeon: Merrie Roof, MD;  Location: [facility];  Service: General;  Laterality: N/A;  . DIAGNOSTIC LAPAROSCOPY    . DILATION AND CURETTAGE OF UTERUS    . HERNIA REPAIR    . MYOMECTOMY  2010   LAPAROSCOPY  . thyroid removed    . TOTAL THYROIDECTOMY  2006  . UMBILICAL HERNIA REPAIR  1982     OB History    Gravida  2   Para  0   Term  0   Preterm  0   AB  2  Living        SAB  2   TAB  0   Ectopic  0   Multiple      Live Births               Home Medications    Prior to Admission medications   Medication Sig Start Date End Date Taking? Authorizing Provider  cholecalciferol (VITAMIN D) 1000 UNITS tablet Take 2,000 Units by mouth daily.  03/14/12  Yes Darrol Jump, MD  cloNIDine (CATAPRES) 0.1 MG tablet Take 0.1 mg by mouth 2 (two) times daily. 08/24/17  Yes [provider]  diclofenac sodium (VOLTAREN) 1 % GEL Apply 2 g topically daily as needed.   Yes [provider]  docusate sodium (COLACE) 100 MG capsule Take 2 capsules (200 mg total) by mouth every morning. For stool softner Patient  taking differently: Take 100 mg by mouth at bedtime. For stool softner 03/14/12  Yes Darrol Jump, MD  ferrous sulfate 325 (65 FE) MG tablet Take 1 tablet (325 mg total) by mouth daily with breakfast. For low iron Patient taking differently: Take 325 mg by mouth 2 (two) times daily with a meal. For low iron 03/14/12  Yes Darrol Jump, MD  fluticasone (FLONASE) 50 MCG/ACT nasal spray Place 2 sprays into both nostrils daily. 10/01/17  Yes Couture, Cortni S, PA-C  glipiZIDE (GLUCOTROL) 10 MG tablet Take 10 mg by mouth daily before breakfast.   Yes [provider]  hydrOXYzine (ATARAX/VISTARIL) 25 MG tablet Take 25 mg by mouth 2 (two) times daily.   Yes [provider]  insulin glargine (LANTUS) 100 UNIT/ML injection Inject 90 Units into the skin at bedtime.   Yes [provider]  levothyroxine (SYNTHROID, LEVOTHROID) 125 MCG tablet Take 125 mcg by mouth daily before breakfast.    Yes [provider]  lithium carbonate 300 MG capsule Take 600 mg by mouth at bedtime.    Yes [provider]  lurasidone (LATUDA) 80 MG TABS tablet Take 80 mg by mouth at bedtime.   Yes [provider]  prazosin (MINIPRESS) 1 MG capsule Take 1 capsule (1 mg total) by mouth at bedtime and may repeat dose one time if needed. For sleep/nightmares 03/14/12  Yes Darrol Jump, MD  propranolol (INDERAL) 40 MG tablet Take 40 mg by mouth 2 (two) times daily.    Yes [provider]  sitaGLIPtin (JANUVIA) 100 MG tablet Take 100 mg by mouth daily.   Yes [provider]  tolterodine (DETROL) 2 MG tablet Take 2 mg by mouth daily.    Yes [provider]  HYDROcodone-acetaminophen (NORCO/VICODIN) 5-325 MG tablet Take 1 tablet by mouth every 4 (four) hours as needed. Patient not taking: Reported on 03/06/2018 02/21/18   Ward, Delice Bison, DO  ondansetron (ZOFRAN ODT) 4 MG disintegrating tablet Take 1 tablet (4 mg total) by mouth every 6 (six) hours as needed for  nausea or vomiting. 02/21/18   Ward, Delice Bison, DO    Family History Family History  Problem Relation Age of Onset  . Thyroid disease Mother   . Diabetes Father   . Hypertension Father   . Heart disease Father   . Heart attack Father   . Cancer Maternal Aunt        BRAIN  . Cancer Maternal Grandmother        LYMPHOMA  . Hypertension Maternal Grandmother   . Cancer Paternal Grandmother  PANCREATIC  . Diabetes Paternal Grandmother   . Hypertension Paternal Grandmother   . Hypertension Maternal Grandfather   . Hypertension Paternal Grandfather     Social History Social History   Tobacco Use  . Smoking status: Never Smoker  . Smokeless tobacco: Never Used  Substance Use Topics  . Alcohol use: Never    Frequency: Never  . Drug use: Never     Allergies   Aspirin; Bee venom; Codeine; Codeine; Ibuprofen; Ibuprofen; Metformin and related; Morphine; Morphine and related; Penicillins; Penicillins; and Asa [aspirin]   Review of Systems Review of Systems  Constitutional: Negative for chills, diaphoresis and fever.  Respiratory: Negative for cough and shortness of breath.   Cardiovascular: Negative for chest pain, palpitations and leg swelling.  Gastrointestinal: Positive for nausea. Negative for abdominal pain, diarrhea and vomiting.  Endocrine: Positive for polyuria.  Genitourinary: Negative for dysuria and hematuria.  Neurological: Positive for light-headedness and headaches. Negative for syncope, weakness and numbness.  All other systems reviewed and are negative.    Physical Exam Updated Vital Signs BP 131/83 (BP Location: Left Arm)   Pulse 74   Temp 98.6 F (37 C)   Resp 18   Ht 5\' 10"  (1.778 m)   Wt 136.1 kg   LMP 09/20/2011   SpO2 99%   BMI 43.05 kg/m   Physical Exam  Constitutional: She appears well-developed and well-nourished. No distress.  HENT:  Head: Normocephalic and atraumatic.  Mouth/Throat: Mucous membranes are dry.  Eyes: Conjunctivae  are normal.  Neck: Neck supple.  Cardiovascular: Normal rate, regular rhythm, normal heart sounds and intact distal pulses.  Pulmonary/Chest: Effort normal and breath sounds normal. No respiratory distress.  Abdominal: Soft. There is no tenderness. There is no guarding.  Musculoskeletal: She exhibits no edema.  Lymphadenopathy:    She has no cervical adenopathy.  Neurological: She is alert.  Sensation grossly intact to light touch in the extremities. Strength 5/5 in all extremities. No gait disturbance. Coordination intact. Cranial nerves III-XII grossly intact. No facial droop.   Skin: Skin is warm and dry. She is not diaphoretic.  Psychiatric: She has a normal mood and affect. Her behavior is normal.  Nursing note and vitals reviewed.    ED Treatments / Results  Labs (all labs ordered are listed, but only abnormal results are displayed) Labs Reviewed  BASIC METABOLIC PANEL - Abnormal; Notable for the following components:      Result Value   Glucose, Bld 339 (*)    All other components within normal limits  CBC - Abnormal; Notable for the following components:   WBC 11.3 (*)    Hemoglobin 11.9 (*)    Platelets 464 (*)    All other components within normal limits  URINALYSIS, ROUTINE W REFLEX MICROSCOPIC - Abnormal; Notable for the following components:   APPearance HAZY (*)    Glucose, UA >=500 (*)    All other components within normal limits  CBG MONITORING, ED - Abnormal; Notable for the following components:   Glucose-Capillary 329 (*)    All other components within normal limits  CBG MONITORING, ED - Abnormal; Notable for the following components:   Glucose-Capillary 345 (*)    All other components within normal limits  CBG MONITORING, ED    EKG None  Radiology No results found.  Procedures Procedures (including critical care time)  Medications Ordered in ED Medications  sodium chloride 0.9 % bolus 1,000 mL (0 mLs Intravenous Stopped 03/06/18 1857)    acetaminophen (TYLENOL) tablet  650 mg (650 mg Oral Given 03/06/18 1757)  metoCLOPramide (REGLAN) injection 10 mg (10 mg Intravenous Given 03/06/18 1754)     Initial Impression / Assessment and Plan / ED Course  I have reviewed the triage vital signs and the nursing notes.  Pertinent labs & imaging results that were available during my care of the patient were reviewed by me and considered in my medical decision making (see chart for details).  Clinical Course as of Mar 06 2314  Tue Mar 06, 2018  1917 Patient states she feels much better.   [SJ]  2241 Suspect this is due to patient being due for her lantus.  Glucose-Capillary(!): 345 [SJ]    Clinical Course User Index [SJ] Tiajah Oyster C, PA-C   Patient presents with hyperglycemia. Patient is nontoxic appearing, afebrile, not tachycardic, not tachypneic, not hypotensive, maintains excellent SPO2 on room air, and is in no apparent distress.  No focal neuro deficits.  No elevated anion gap. PCP follow up. The patient was given instructions for home care as well as return precautions. Patient voices understanding of these instructions, accepts the plan, and is comfortable with discharge.   Findings and plan of care discussed with Lennice Sites, DO. Dr. Ronnald Nian personally evaluated and examined this patient.  Vitals:   03/06/18 2056 03/06/18 2218 03/06/18 2222 03/06/18 2257  BP: 126/61 (!) 104/58 (!) 104/58   Pulse: 69 70 75   Resp:   19   Temp:    97.6 F (36.4 C)  TempSrc:    Oral  SpO2: 99% 99% 99%   Weight:      Height:         Final Clinical Impressions(s) / ED Diagnoses   Final diagnoses:  Hyperglycemia    ED Discharge Orders    None       Layla Maw 03/06/18 2317    Lennice Sites, DO 03/07/18 0017

## 2018-03-06 NOTE — ED Triage Notes (Signed)
Patient c/o hyperglycemia, headache, nausea, blurred vision since yesterday.

## 2018-03-14 NOTE — Progress Notes (Signed)
Patient ID: Lynn Martinez, female   DOB: Jan 01, 1971, 47 y.o.   MRN: [id number]      Lynn Martinez, is a 47 y.o. female  [id number]  [id number]  DOB - 12/11/1970  Subjective:  Chief Complaint and HPI: Lynn Martinez is a 47 y.o. female here today to establish care and for a follow up visit After being seen in the ED 03/06/2018 for uncontrolled DM.  She is feeling well today.  Doesn't check blood sugars bc doesn't like sticking herself.  She admits to poor overall compliance with meds-says she takes diabetes meds 70-80% of the time.    Last had thyroid studies done this summer and no changes were made to regimen.  Those results not in Epic.    From ED note: 47 y.o. female, with a history of DM, HTN, and hypothyroidism, presenting to the ED with hyperglycemia for the past few days.  Also endorses fatigue, headache, shakiness, nausea, lightheadedness, and intermittent blurry vision. Has had these issues before with high blood sugar.  She has largely been compliant with her medications. She states she has been "really busy lately" and has not been taking care of herself as well. Denies syncope, fever/chills, shortness of breath, cough, vision loss, neuro deficits, chest pain, abdominal pain, vomiting/diarrhea, or any other complaints.  A/P: Patient presents with hyperglycemia. Patient is nontoxic appearing, afebrile, not tachycardic, not tachypneic, not hypotensive, maintains excellent SPO2 on room air, and is in no apparent distress.  No focal neuro deficits.  No elevated anion gap. PCP follow up. The patient was given instructions for home care as well as return precautions. Patient voices understanding of these instructions, accepts the plan, and is comfortable with discharge.  ED/Hospital notes reviewed and summarized above.   FH:  +DM and HTN  ROS:   Constitutional:  No f/c, No night sweats, No unexplained weight loss. EENT:  No vision changes, No blurry vision, No  hearing changes. No mouth, throat, or ear problems.  Respiratory: No cough, No SOB Cardiac: No CP, no palpitations GI:  No abd pain, No N/V/D. GU: No Urinary s/sx Musculoskeletal: body pains Neuro: No headache, no dizziness, no motor weakness.  Skin: No rash Endocrine:  No polydipsia. No polyuria.  Psych: Denies SI/HI  No problems updated.  ALLERGIES: Allergies  Allergen Reactions  . Aspirin Hives  . Bee Venom   . Codeine Itching    Tolerates Hydrocodone OK.  . Codeine Itching  . Ibuprofen Other (See Comments)    Happened in childhood  . Ibuprofen Hives and Swelling  . Metformin And Related Other (See Comments)    Muscle weakness  . Morphine Other (See Comments)    Throat swelling  . Morphine And Related Itching  . Penicillins Other (See Comments)    Childhood reaction  Has patient had a PCN reaction causing immediate rash, facial/tongue/throat swelling, SOB or lightheadedness with hypotension: unknown Has patient had a PCN reaction causing severe rash involving mucus membranes or skin necrosis: unknown Has patient had a PCN reaction that required hospitalization unknown Has patient had a PCN reaction occurring within the last 10 years: unknown If all of the above answers are "NO", then may proceed with Cephalosporin use.  Marland Kitchen Penicillins     Childhood Allergy Has patient had a PCN reaction causing immediate rash, facial/tongue/throat swelling, SOB or lightheadedness with hypotension: Unknown Has patient had a PCN reaction causing severe rash involving mucus membranes or skin necrosis: Unknown Has patient had a PCN reaction that required hospitalization:  Unknown Has patient had a PCN reaction occurring within the last 10 years: Unknown If all of the above answers are "NO", then may proceed with Cephalosporin use.   Diona Fanti [Aspirin] Swelling and Rash    PAST MEDICAL HISTORY: Past Medical History:  Diagnosis Date  . Anemia   . Anxiety   . Arthritis    "knees"  (09/30/2015)  . Bipolar disorder ([place])   . Depression   . Diabetes mellitus without complication ([place])   . Fibroid    s/p myomectomy 2010, hysterectomy 2013  . Grave's disease   . Heart murmur   . Hormone disorder   . Hypertension   . Hypothyroidism   . 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 Migraine     otc med prn  . PCOS (polycystic ovarian syndrome)   . PMS (premenstrual syndrome)   . PTSD (post-traumatic stress disorder)   . Seasonal allergies   . Thyroid cancer ([place]) 2005  . Thyroid disease   . Type II diabetes mellitus ([place])   . Vertigo     MEDICATIONS AT HOME: Prior to Admission medications   Medication Sig Start Date End Date Taking? Authorizing Provider  cholecalciferol (VITAMIN D) 1000 UNITS tablet Take 2,000 Units by mouth daily.  03/14/12  Yes Darrol Jump, MD  cloNIDine (CATAPRES) 0.1 MG tablet Take 0.1 mg by mouth 2 (two) times daily. 08/24/17  Yes [provider]  diclofenac sodium (VOLTAREN) 1 % GEL Apply 2 g topically daily as needed.   Yes [provider]  docusate sodium (COLACE) 100 MG capsule Take 2 capsules (200 mg total) by mouth every morning. For stool softner Patient taking differently: Take 100 mg by mouth at bedtime. For stool softner 03/14/12  Yes Darrol Jump, MD  ferrous sulfate 325 (65 FE) MG tablet Take 1 tablet (325 mg total) by mouth daily with breakfast. For low iron Patient taking differently: Take 325 mg by mouth 2 (two) times daily with a meal. For low iron 03/14/12  Yes Darrol Jump, MD  glipiZIDE (GLUCOTROL) 10 MG tablet Take 1 tablet (10 mg total) by mouth daily before breakfast. 03/15/18  Yes Keano Guggenheim M, PA-C  hydrOXYzine (ATARAX/VISTARIL) 25 MG tablet Take 25 mg by mouth 2 (two) times daily.   Yes [provider]  insulin glargine (LANTUS) 100 UNIT/ML injection Inject 0.47 mLs (47 Units total) into the skin 2 (two) times daily. 03/15/18  Yes Brianda Beitler M, PA-C    levothyroxine (SYNTHROID, LEVOTHROID) 125 MCG tablet Take 125 mcg by mouth daily before breakfast.    Yes [provider]  lithium carbonate 300 MG capsule Take 600 mg by mouth at bedtime.    Yes [provider]  lurasidone (LATUDA) 80 MG TABS tablet Take 80 mg by mouth at bedtime.   Yes [provider]  prazosin (MINIPRESS) 1 MG capsule Take 1 capsule (1 mg total) by mouth at bedtime and may repeat dose one time if needed. For sleep/nightmares 03/14/12  Yes Darrol Jump, MD  propranolol (INDERAL) 40 MG tablet Take 40 mg by mouth 2 (two) times daily.    Yes [provider]  sitaGLIPtin (JANUVIA) 100 MG tablet Take 1 tablet (100 mg total) by mouth daily. 03/15/18  Yes Argentina Donovan, PA-C  tolterodine (DETROL) 2 MG tablet Take 2 mg by mouth daily.    Yes [provider]  fluticasone (FLONASE) 50 MCG/ACT nasal spray Place  2 sprays into both nostrils daily. Patient not taking: Reported on 03/15/2018 10/01/17   Couture, Cortni S, PA-C  HYDROcodone-acetaminophen (NORCO/VICODIN) 5-325 MG tablet Take 1 tablet by mouth every 4 (four) hours as needed. Patient not taking: Reported on 03/15/2018 02/21/18   Ward, Delice Bison, DO  ondansetron (ZOFRAN ODT) 4 MG disintegrating tablet Take 1 tablet (4 mg total) by mouth every 6 (six) hours as needed for nausea or vomiting. Patient not taking: Reported on 03/15/2018 02/21/18   Ward, Delice Bison, DO     Objective:  EXAM:   Vitals:   03/15/18 1133  BP: 123/85  Pulse: 63  Resp: 18  Temp: 98.4 F (36.9 C)  TempSrc: Oral  SpO2: 100%  Weight: 291 lb 12.8 oz (132.4 kg)  Height: 5\' 10"  (1.778 m)    General appearance : A&OX3. NAD. Non-toxic-appearing HEENT: Atraumatic and Normocephalic.  PERRLA. EOM intact.  Neck: supple, no JVD. No cervical lymphadenopathy. No thyromegaly Chest/Lungs:  Breathing-non-labored, Good air entry bilaterally, breath sounds normal without rales, rhonchi, or wheezing  CVS: S1 S2 regular,  no murmurs, gallops, rubs  Extremities: Bilateral Lower Ext shows no edema, both legs are warm to touch with = pulse throughout Neurology:  CN II-XII grossly intact, Non focal.   Psych:  TP linear. J/I WNL. Normal speech. Appropriate eye contact and affect.  Skin:  No Rash  Data Review Lab Results  Component Value Date   HGBA1C 11.1 (A) 03/15/2018   HGBA1C 11.1 03/15/2018   HGBA1C 11.1 (A) 03/15/2018   HGBA1C 11.1 (A) 03/15/2018     Assessment & Plan   1. Insulin dependent diabetes mellitus (HCC) Uncontrolled but also only 70-80% adhering to regimen.  Advised setting timers/alerts as reminders.  Will slightly increase insulin dose and divide it for better absorption. Otherwise continue januvia and glucotrol.   - Glucose (CBG) - HgB A1c - sitaGLIPtin (JANUVIA) 100 MG tablet; Take 1 tablet (100 mg total) by mouth daily.  Dispense: 30 tablet; Refill: 5 - insulin glargine (LANTUS) 100 UNIT/ML injection; Inject 0.47 mLs (47 Units total) into the skin 2 (two) times daily.  Dispense: 10 mL; Refill: 3 - glipiZIDE (GLUCOTROL) 10 MG tablet; Take 1 tablet (10 mg total) by mouth daily before breakfast.  Dispense: 60 tablet; Refill: 3  2. Essential hypertension Controlled-continue current regimen  3. Postoperative hypothyroidism Bring in most recent labs to next visit  4. Encounter for examination following treatment at hospital improving  5. Non-compliance Check blood sugars at least fasting daily and then a few times in the evening and record and bring to your next visit.    Get copies of your last thyroid tests.  Drink more water:  80-100 ounces daily.  Eliminate sugar and white carbohydrates/starchy foods from your diet.    Take all medications.  Set timers/alarms if needed to help you remember   Patient have been counseled extensively about nutrition and exercise  Return in about 3 weeks (around 04/05/2018) for assign PCP-recheck blood sugars.  The patient was given clear  instructions to go to ER or return to medical center if symptoms don't improve, worsen or new problems develop. The patient verbalized understanding. The patient was told to call to get lab results if they haven't heard anything in the next week.     Freeman Caldron, PA-C [facility] [place], [place]   03/15/2018, 11:53 AM

## 2018-03-15 ENCOUNTER — Ambulatory Visit: Payer: BLUE CROSS/BLUE SHIELD | Attending: Family Medicine | Admitting: Physician Assistant

## 2018-03-15 ENCOUNTER — Other Ambulatory Visit: Payer: Self-pay

## 2018-03-15 VITALS — BP 123/85 | HR 63 | Temp 98.4°F | Resp 18 | Ht 70.0 in | Wt 291.8 lb

## 2018-03-15 DIAGNOSIS — Z9119 Patient's noncompliance with other medical treatment and regimen: Secondary | ICD-10-CM | POA: Diagnosis not present

## 2018-03-15 DIAGNOSIS — Z885 Allergy status to narcotic agent status: Secondary | ICD-10-CM | POA: Insufficient documentation

## 2018-03-15 DIAGNOSIS — Z880 Allergy status to penicillin: Secondary | ICD-10-CM | POA: Insufficient documentation

## 2018-03-15 DIAGNOSIS — E1165 Type 2 diabetes mellitus with hyperglycemia: Secondary | ICD-10-CM | POA: Diagnosis not present

## 2018-03-15 DIAGNOSIS — Z23 Encounter for immunization: Secondary | ICD-10-CM

## 2018-03-15 DIAGNOSIS — IMO0001 Reserved for inherently not codable concepts without codable children: Secondary | ICD-10-CM

## 2018-03-15 DIAGNOSIS — E890 Postprocedural hypothyroidism: Secondary | ICD-10-CM

## 2018-03-15 DIAGNOSIS — Z8249 Family history of ischemic heart disease and other diseases of the circulatory system: Secondary | ICD-10-CM | POA: Diagnosis not present

## 2018-03-15 DIAGNOSIS — I10 Essential (primary) hypertension: Secondary | ICD-10-CM

## 2018-03-15 DIAGNOSIS — E0500 Thyrotoxicosis with diffuse goiter without thyrotoxic crisis or storm: Secondary | ICD-10-CM | POA: Insufficient documentation

## 2018-03-15 DIAGNOSIS — Z79890 Hormone replacement therapy (postmenopausal): Secondary | ICD-10-CM | POA: Insufficient documentation

## 2018-03-15 DIAGNOSIS — Z7689 Persons encountering health services in other specified circumstances: Secondary | ICD-10-CM | POA: Insufficient documentation

## 2018-03-15 DIAGNOSIS — E119 Type 2 diabetes mellitus without complications: Secondary | ICD-10-CM | POA: Diagnosis not present

## 2018-03-15 DIAGNOSIS — Z91199 Patient's noncompliance with other medical treatment and regimen due to unspecified reason: Secondary | ICD-10-CM

## 2018-03-15 DIAGNOSIS — Z79899 Other long term (current) drug therapy: Secondary | ICD-10-CM | POA: Diagnosis not present

## 2018-03-15 DIAGNOSIS — Z833 Family history of diabetes mellitus: Secondary | ICD-10-CM | POA: Insufficient documentation

## 2018-03-15 DIAGNOSIS — F319 Bipolar disorder, unspecified: Secondary | ICD-10-CM | POA: Insufficient documentation

## 2018-03-15 DIAGNOSIS — Z794 Long term (current) use of insulin: Secondary | ICD-10-CM | POA: Insufficient documentation

## 2018-03-15 DIAGNOSIS — Z09 Encounter for follow-up examination after completed treatment for conditions other than malignant neoplasm: Secondary | ICD-10-CM

## 2018-03-15 DIAGNOSIS — E282 Polycystic ovarian syndrome: Secondary | ICD-10-CM | POA: Insufficient documentation

## 2018-03-15 DIAGNOSIS — F419 Anxiety disorder, unspecified: Secondary | ICD-10-CM | POA: Insufficient documentation

## 2018-03-15 DIAGNOSIS — Z886 Allergy status to analgesic agent status: Secondary | ICD-10-CM | POA: Diagnosis not present

## 2018-03-15 LAB — POCT GLYCOSYLATED HEMOGLOBIN (HGB A1C)
HBA1C, POC (CONTROLLED DIABETIC RANGE): 11.1 % — AB (ref 0.0–7.0)
HBA1C, POC (PREDIABETIC RANGE): 11.1 % — AB (ref 5.7–6.4)
HEMOGLOBIN A1C: 11.1 % — AB (ref 4.0–5.6)
HbA1c POC (<> result, manual entry): 11.1 % (ref 4.0–5.6)

## 2018-03-15 LAB — GLUCOSE, POCT (MANUAL RESULT ENTRY): POC GLUCOSE: 298 mg/dL — AB (ref 70–99)

## 2018-03-15 MED ORDER — GLIPIZIDE 10 MG PO TABS: 10.0000 mg | ORAL_TABLET | Freq: Every day | ORAL | 3 refills | Status: DC

## 2018-03-15 MED ORDER — INSULIN GLARGINE 100 UNIT/ML ~~LOC~~ SOLN: 47.0000 [IU] | Freq: Two times a day (BID) | SUBCUTANEOUS | 3 refills | Status: DC

## 2018-03-15 MED ORDER — SITAGLIPTIN PHOSPHATE 100 MG PO TABS: 100.0000 mg | ORAL_TABLET | Freq: Every day | ORAL | 5 refills | Status: DC

## 2018-03-15 NOTE — Patient Instructions (Signed)
Check blood sugars at least fasting daily and then a few times in the evening and record and bring to your next visit.    Get copies of your last thyroid tests.  Drink more water:  80-100 ounces daily.  Eliminate sugar and white carbohydrates/starchy foods from your diet.    Take all medications.  Set timers/alarms if needed to help you remember

## 2018-03-19 ENCOUNTER — Other Ambulatory Visit: Payer: Self-pay | Admitting: General Practice

## 2018-03-19 DIAGNOSIS — I10 Essential (primary) hypertension: Secondary | ICD-10-CM

## 2018-03-19 MED ORDER — CLONIDINE HCL 0.1 MG PO TABS: 0.1000 mg | ORAL_TABLET | Freq: Two times a day (BID) | ORAL | 0 refills | Status: DC

## 2018-03-19 NOTE — Telephone Encounter (Signed)
1) Medication(s) Requested (by name): clonidine 2) Pharmacy of Choice: [facility] 3) Special Requests:   Approved medications will be sent to the pharmacy, we will reach out if there is an issue.  Requests made after 3pm may not be addressed until the following business day!  If a patient is unsure of the name of the medication(s) please note and ask patient to call back when they are able to provide all info, do not send to responsible party until all information is available!

## 2018-03-21 DIAGNOSIS — F3132 Bipolar disorder, current episode depressed, moderate: Secondary | ICD-10-CM | POA: Diagnosis not present

## 2018-03-25 ENCOUNTER — Other Ambulatory Visit: Payer: Self-pay

## 2018-03-25 ENCOUNTER — Encounter (HOSPITAL_COMMUNITY): Payer: Self-pay | Admitting: Emergency Medicine

## 2018-03-25 ENCOUNTER — Emergency Department (HOSPITAL_COMMUNITY)
Admission: EM | Admit: 2018-03-25 | Discharge: 2018-03-26 | Disposition: A | Discharge status: Home / Self Care | Payer: BLUE CROSS/BLUE SHIELD | Attending: Emergency Medicine | Admitting: Emergency Medicine

## 2018-03-25 DIAGNOSIS — F319 Bipolar disorder, unspecified: Secondary | ICD-10-CM | POA: Diagnosis not present

## 2018-03-25 DIAGNOSIS — Z85850 Personal history of malignant neoplasm of thyroid: Secondary | ICD-10-CM | POA: Diagnosis not present

## 2018-03-25 DIAGNOSIS — R51 Headache: Secondary | ICD-10-CM | POA: Diagnosis not present

## 2018-03-25 DIAGNOSIS — E039 Hypothyroidism, unspecified: Secondary | ICD-10-CM | POA: Insufficient documentation

## 2018-03-25 DIAGNOSIS — F419 Anxiety disorder, unspecified: Secondary | ICD-10-CM | POA: Insufficient documentation

## 2018-03-25 DIAGNOSIS — Z79899 Other long term (current) drug therapy: Secondary | ICD-10-CM | POA: Insufficient documentation

## 2018-03-25 DIAGNOSIS — I10 Essential (primary) hypertension: Secondary | ICD-10-CM | POA: Diagnosis not present

## 2018-03-25 DIAGNOSIS — Z794 Long term (current) use of insulin: Secondary | ICD-10-CM | POA: Diagnosis not present

## 2018-03-25 DIAGNOSIS — E119 Type 2 diabetes mellitus without complications: Secondary | ICD-10-CM | POA: Diagnosis not present

## 2018-03-25 DIAGNOSIS — Z9049 Acquired absence of other specified parts of digestive tract: Secondary | ICD-10-CM | POA: Diagnosis not present

## 2018-03-25 DIAGNOSIS — R519 Headache, unspecified: Secondary | ICD-10-CM

## 2018-03-25 MED ORDER — TRAMADOL HCL 50 MG PO TABS: 50.0000 mg | ORAL_TABLET | Freq: Four times a day (QID) | ORAL | 0 refills | Status: DC | PRN

## 2018-03-25 MED ORDER — PROCHLORPERAZINE EDISYLATE 10 MG/2ML IJ SOLN
10.0000 mg | Freq: Once | INTRAMUSCULAR | Status: AC
  Administered 2018-03-25: 10 mg via INTRAVENOUS
  Filled 2018-03-25: qty 2

## 2018-03-25 MED ORDER — DIPHENHYDRAMINE HCL 50 MG/ML IJ SOLN
25.0000 mg | Freq: Once | INTRAMUSCULAR | Status: AC
  Administered 2018-03-25: 25 mg via INTRAVENOUS
  Filled 2018-03-25: qty 1

## 2018-03-25 NOTE — ED Provider Notes (Signed)
[facility] DEPT Provider Note   CSN: [id number] Arrival date & time: 03/25/18  1929     History   Chief Complaint Chief Complaint  Patient presents with  . Headache    HPI Lynn Martinez is a 47 y.o. female.  Patient complains of a headache.  Patient has a history of headaches.  Patient took some Tylenol without relief  The history is provided by the patient. No language interpreter was used.  Headache   This is a new problem. The current episode started 12 to 24 hours ago. The problem occurs constantly. The problem has not changed since onset.The headache is associated with nothing. The pain is located in the bilateral region. The quality of the pain is described as dull. The pain is at a severity of 6/10. The pain is moderate. The pain does not radiate. Pertinent negatives include no anorexia. She has tried nothing for the symptoms. The treatment provided no relief.    Past Medical History:  Diagnosis Date  . Anemia   . Anxiety   . Arthritis    "knees" (09/30/2015)  . Bipolar disorder ([place])   . Depression   . Diabetes mellitus without complication ([place])   . Fibroid    s/p myomectomy 2010, hysterectomy 2013  . Grave's disease   . Heart murmur   . Hormone disorder   . Hypertension   . Hypothyroidism   . 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 Migraine     otc med prn  . PCOS (polycystic ovarian syndrome)   . PMS (premenstrual syndrome)   . PTSD (post-traumatic stress disorder)   . Seasonal allergies   . Thyroid cancer ([place]) 2005  . Thyroid disease   . Type II diabetes mellitus ([place])   . Vertigo     Patient Active Problem List   Diagnosis Date Noted  . Pain in the chest 09/30/2015  . Hypertension 09/30/2015  . Insulin dependent diabetes mellitus ([place]) 09/30/2015  . Chest pain 09/30/2015  . Cholelithiasis with acute cholecystitis 09/05/2013  . Borderline behavior 03/14/2012    Class:  Chronic  . OCD (obsessive compulsive disorder) 03/14/2012    Class: Chronic  . PTSD (post-traumatic stress disorder) 03/14/2012    Class: Chronic  . 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 Lumbago without sciatica 03/12/2012  . Major depression, recurrent ([place]) 03/08/2012    Class: Chronic  . Snoring 12/02/2011  . PCOS (polycystic ovarian syndrome) 09/05/2011  . Hypothyroidism 08/13/2010  . VITAMIN D DEFICIENCY 08/13/2010  . ANEMIA-NOS 08/13/2010    Past Surgical History:  Procedure Laterality Date  . ABDOMINAL HYSTERECTOMY  10/11/2011   Procedure: HYSTERECTOMY ABDOMINAL;  Surgeon: Terrance Mass, MD;  Location: [facility];  Service: Gynecology;  Laterality: N/A;  With Repair of serosa.  . ABDOMINAL HYSTERECTOMY    . CHOLECYSTECTOMY N/A 09/06/2013   Procedure: LAPAROSCOPIC CHOLECYSTECTOMY WITH INTRAOPERATIVE CHOLANGIOGRAM;  Surgeon: Merrie Roof, MD;  Location: [facility];  Service: General;  Laterality: N/A;  . DIAGNOSTIC LAPAROSCOPY    . DILATION AND CURETTAGE OF UTERUS    . HERNIA REPAIR    . MYOMECTOMY  2010   LAPAROSCOPY  . thyroid removed    . TOTAL THYROIDECTOMY  2006  . UMBILICAL HERNIA REPAIR  1982     OB History    Gravida  2   Para  0   Term  0  Preterm  0   AB  2   Living        SAB  2   TAB  0   Ectopic  0   Multiple      Live Births               Home Medications    Prior to Admission medications   Medication Sig Start Date End Date Taking? Authorizing Provider  cholecalciferol (VITAMIN D) 1000 UNITS tablet Take 2,000 Units by mouth daily.  03/14/12   Darrol Jump, MD  cloNIDine (CATAPRES) 0.1 MG tablet Take 1 tablet (0.1 mg total) by mouth 2 (two) times daily. Needs office visit prior to future refills 03/19/18   Fulp, Cammie, MD  diclofenac sodium (VOLTAREN) 1 % GEL Apply 2 g topically daily as needed.    [provider]  docusate sodium (COLACE) 100 MG capsule Take 2 capsules (200 mg total)  by mouth every morning. For stool softner Patient taking differently: Take 100 mg by mouth at bedtime. For stool softner 03/14/12   Darrol Jump, MD  ferrous sulfate 325 (65 FE) MG tablet Take 1 tablet (325 mg total) by mouth daily with breakfast. For low iron Patient taking differently: Take 325 mg by mouth 2 (two) times daily with a meal. For low iron 03/14/12   Darrol Jump, MD  fluticasone (FLONASE) 50 MCG/ACT nasal spray Place 2 sprays into both nostrils daily. Patient not taking: Reported on 03/15/2018 10/01/17   Couture, Cortni S, PA-C  glipiZIDE (GLUCOTROL) 10 MG tablet Take 1 tablet (10 mg total) by mouth daily before breakfast. 03/15/18   Argentina Donovan, PA-C  HYDROcodone-acetaminophen (NORCO/VICODIN) 5-325 MG tablet Take 1 tablet by mouth every 4 (four) hours as needed. Patient not taking: Reported on 03/15/2018 02/21/18   Ward, Delice Bison, DO  hydrOXYzine (ATARAX/VISTARIL) 25 MG tablet Take 25 mg by mouth 2 (two) times daily.    [provider]  insulin glargine (LANTUS) 100 UNIT/ML injection Inject 0.47 mLs (47 Units total) into the skin 2 (two) times daily. 03/15/18   Argentina Donovan, PA-C  levothyroxine (SYNTHROID, LEVOTHROID) 125 MCG tablet Take 125 mcg by mouth daily before breakfast.     [provider]  lithium carbonate 300 MG capsule Take 600 mg by mouth at bedtime.     [provider]  lurasidone (LATUDA) 80 MG TABS tablet Take 80 mg by mouth at bedtime.    [provider]  ondansetron (ZOFRAN ODT) 4 MG disintegrating tablet Take 1 tablet (4 mg total) by mouth every 6 (six) hours as needed for nausea or vomiting. Patient not taking: Reported on 03/15/2018 02/21/18   Ward, Delice Bison, DO  prazosin (MINIPRESS) 1 MG capsule Take 1 capsule (1 mg total) by mouth at bedtime and may repeat dose one time if needed. For sleep/nightmares 03/14/12   Darrol Jump, MD  propranolol (INDERAL) 40 MG tablet Take 40 mg by mouth 2 (two) times daily.      [provider]  sitaGLIPtin (JANUVIA) 100 MG tablet Take 1 tablet (100 mg total) by mouth daily. 03/15/18   Argentina Donovan, PA-C  tolterodine (DETROL) 2 MG tablet Take 2 mg by mouth daily.     [provider]  traMADol (ULTRAM) 50 MG tablet Take 1 tablet (50 mg total) by mouth every 6 (six) hours as needed. 03/25/18   Milton Ferguson, MD    Family History Family History  Problem Relation Age of  Onset  . Thyroid disease Mother   . Diabetes Father   . Hypertension Father   . Heart disease Father   . Heart attack Father   . Cancer Maternal Aunt        BRAIN  . Cancer Maternal Grandmother        LYMPHOMA  . Hypertension Maternal Grandmother   . Cancer Paternal Grandmother        PANCREATIC  . Diabetes Paternal Grandmother   . Hypertension Paternal Grandmother   . Hypertension Maternal Grandfather   . Hypertension Paternal Grandfather     Social History Social History   Tobacco Use  . Smoking status: Never Smoker  . Smokeless tobacco: Never Used  Substance Use Topics  . Alcohol use: Never    Frequency: Never  . Drug use: Never     Allergies   Aspirin; Bee venom; Codeine; Codeine; Ibuprofen; Ibuprofen; Metformin and related; Morphine; Morphine and related; Penicillins; Penicillins; and Asa [aspirin]   Review of Systems Review of Systems  Constitutional: Negative for appetite change and fatigue.  HENT: Negative for congestion, ear discharge and sinus pressure.   Eyes: Negative for discharge.  Respiratory: Negative for cough.   Cardiovascular: Negative for chest pain.  Gastrointestinal: Negative for abdominal pain, anorexia and diarrhea.  Genitourinary: Negative for frequency and hematuria.  Musculoskeletal: Negative for back pain.  Skin: Negative for rash.  Neurological: Positive for headaches. Negative for seizures.  Psychiatric/Behavioral: Negative for hallucinations.     Physical Exam Updated Vital Signs BP (!) 147/81 (BP Location: Right  Arm)   Pulse (!) 56   Temp 98.7 F (37.1 C) (Oral)   Resp 18   LMP 09/20/2011   SpO2 99%   Physical Exam  Constitutional: She is oriented to person, place, and time. She appears well-developed.  HENT:  Head: Normocephalic.  Eyes: Conjunctivae and EOM are normal. No scleral icterus.  Neck: Neck supple. No thyromegaly present.  Cardiovascular: Normal rate and regular rhythm. Exam reveals no gallop and no friction rub.  No murmur heard. Pulmonary/Chest: No stridor. She has no wheezes. She has no rales. She exhibits no tenderness.  Abdominal: She exhibits no distension. There is no tenderness. There is no rebound.  Musculoskeletal: Normal range of motion. She exhibits no edema.  Lymphadenopathy:    She has no cervical adenopathy.  Neurological: She is oriented to person, place, and time. She exhibits normal muscle tone. Coordination normal.  Skin: No rash noted. No erythema.  Psychiatric: She has a normal mood and affect. Her behavior is normal.     ED Treatments / Results  Labs (all labs ordered are listed, but only abnormal results are displayed) Labs Reviewed - No data to display  EKG None  Radiology No results found.  Procedures Procedures (including critical care time)  Medications Ordered in ED Medications  diphenhydrAMINE (BENADRYL) injection 25 mg (25 mg Intravenous Given 03/25/18 2234)  prochlorperazine (COMPAZINE) injection 10 mg (10 mg Intravenous Given 03/25/18 2234)     Initial Impression / Assessment and Plan / ED Course  I have reviewed the triage vital signs and the nursing notes.  Pertinent labs & imaging results that were available during my care of the patient were reviewed by me and considered in my medical decision making (see chart for details).     Patient with migraine headache that has improved with migraine cocktail she will follow-up as needed  Final Clinical Impressions(s) / ED Diagnoses   Final diagnoses:  Bad headache  ED  Discharge Orders         Ordered    traMADol (ULTRAM) 50 MG tablet  Every 6 hours PRN     03/25/18 2248           Milton Ferguson, MD 03/25/18 2251

## 2018-03-25 NOTE — ED Triage Notes (Signed)
Patient c/o headache today with nausea and one episode of vomiting. Hx migraines. Reports light sensitivity.

## 2018-03-25 NOTE — ED Notes (Signed)
Patient ambulatory to restroom with steady gait.

## 2018-03-25 NOTE — Discharge Instructions (Addendum)
Follow-up with your family doctor if any problems 

## 2018-04-11 ENCOUNTER — Ambulatory Visit: Payer: BLUE CROSS/BLUE SHIELD | Attending: Family Medicine | Admitting: Family Medicine

## 2018-04-11 ENCOUNTER — Other Ambulatory Visit: Payer: Self-pay

## 2018-04-11 VITALS — BP 122/83 | HR 73 | Temp 97.8°F | Resp 18 | Ht 70.0 in | Wt 291.8 lb

## 2018-04-11 DIAGNOSIS — F419 Anxiety disorder, unspecified: Secondary | ICD-10-CM | POA: Insufficient documentation

## 2018-04-11 DIAGNOSIS — Z880 Allergy status to penicillin: Secondary | ICD-10-CM | POA: Insufficient documentation

## 2018-04-11 DIAGNOSIS — Z8349 Family history of other endocrine, nutritional and metabolic diseases: Secondary | ICD-10-CM | POA: Insufficient documentation

## 2018-04-11 DIAGNOSIS — E890 Postprocedural hypothyroidism: Secondary | ICD-10-CM | POA: Diagnosis not present

## 2018-04-11 DIAGNOSIS — Z91148 Patient's other noncompliance with medication regimen for other reason: Secondary | ICD-10-CM

## 2018-04-11 DIAGNOSIS — Z885 Allergy status to narcotic agent status: Secondary | ICD-10-CM | POA: Insufficient documentation

## 2018-04-11 DIAGNOSIS — I10 Essential (primary) hypertension: Secondary | ICD-10-CM

## 2018-04-11 DIAGNOSIS — E0500 Thyrotoxicosis with diffuse goiter without thyrotoxic crisis or storm: Secondary | ICD-10-CM | POA: Diagnosis not present

## 2018-04-11 DIAGNOSIS — Z8249 Family history of ischemic heart disease and other diseases of the circulatory system: Secondary | ICD-10-CM | POA: Insufficient documentation

## 2018-04-11 DIAGNOSIS — F4310 Post-traumatic stress disorder, unspecified: Secondary | ICD-10-CM | POA: Insufficient documentation

## 2018-04-11 DIAGNOSIS — Z886 Allergy status to analgesic agent status: Secondary | ICD-10-CM | POA: Insufficient documentation

## 2018-04-11 DIAGNOSIS — Z794 Long term (current) use of insulin: Secondary | ICD-10-CM | POA: Diagnosis not present

## 2018-04-11 DIAGNOSIS — IMO0001 Reserved for inherently not codable concepts without codable children: Secondary | ICD-10-CM

## 2018-04-11 DIAGNOSIS — R200 Anesthesia of skin: Secondary | ICD-10-CM | POA: Insufficient documentation

## 2018-04-11 DIAGNOSIS — E1165 Type 2 diabetes mellitus with hyperglycemia: Secondary | ICD-10-CM | POA: Insufficient documentation

## 2018-04-11 DIAGNOSIS — R202 Paresthesia of skin: Secondary | ICD-10-CM | POA: Diagnosis not present

## 2018-04-11 DIAGNOSIS — G43909 Migraine, unspecified, not intractable, without status migrainosus: Secondary | ICD-10-CM | POA: Insufficient documentation

## 2018-04-11 DIAGNOSIS — Z9114 Patient's other noncompliance with medication regimen: Secondary | ICD-10-CM

## 2018-04-11 DIAGNOSIS — E282 Polycystic ovarian syndrome: Secondary | ICD-10-CM | POA: Insufficient documentation

## 2018-04-11 DIAGNOSIS — Z79899 Other long term (current) drug therapy: Secondary | ICD-10-CM | POA: Insufficient documentation

## 2018-04-11 DIAGNOSIS — F319 Bipolar disorder, unspecified: Secondary | ICD-10-CM | POA: Insufficient documentation

## 2018-04-11 DIAGNOSIS — D649 Anemia, unspecified: Secondary | ICD-10-CM | POA: Insufficient documentation

## 2018-04-11 DIAGNOSIS — N3946 Mixed incontinence: Secondary | ICD-10-CM | POA: Diagnosis not present

## 2018-04-11 DIAGNOSIS — R21 Rash and other nonspecific skin eruption: Secondary | ICD-10-CM

## 2018-04-11 DIAGNOSIS — Z6841 Body Mass Index (BMI) 40.0 and over, adult: Secondary | ICD-10-CM

## 2018-04-11 DIAGNOSIS — Z833 Family history of diabetes mellitus: Secondary | ICD-10-CM | POA: Insufficient documentation

## 2018-04-11 DIAGNOSIS — Z9049 Acquired absence of other specified parts of digestive tract: Secondary | ICD-10-CM | POA: Insufficient documentation

## 2018-04-11 DIAGNOSIS — Z90710 Acquired absence of both cervix and uterus: Secondary | ICD-10-CM | POA: Insufficient documentation

## 2018-04-11 DIAGNOSIS — E119 Type 2 diabetes mellitus without complications: Secondary | ICD-10-CM

## 2018-04-11 DIAGNOSIS — R011 Cardiac murmur, unspecified: Secondary | ICD-10-CM | POA: Insufficient documentation

## 2018-04-11 LAB — POCT URINALYSIS DIP (CLINITEK)
Bilirubin, UA: NEGATIVE
Blood, UA: NEGATIVE
Glucose, UA: NEGATIVE mg/dL
Ketones, POC UA: NEGATIVE mg/dL
Leukocytes, UA: NEGATIVE
Nitrite, UA: NEGATIVE
Spec Grav, UA: 1.02
Urobilinogen, UA: 0.2 U/dL
pH, UA: 6

## 2018-04-11 LAB — GLUCOSE, POCT (MANUAL RESULT ENTRY): POC Glucose: 244 mg/dL — AB (ref 70–99)

## 2018-04-11 MED ORDER — CLONIDINE HCL 0.1 MG PO TABS: 0.1000 mg | ORAL_TABLET | Freq: Two times a day (BID) | ORAL | 5 refills | Status: DC

## 2018-04-11 MED ORDER — SITAGLIPTIN PHOSPHATE 100 MG PO TABS: 100.0000 mg | ORAL_TABLET | Freq: Every day | ORAL | 3 refills | Status: DC

## 2018-04-11 MED ORDER — LEVOTHYROXINE SODIUM 125 MCG PO TABS: 125.0000 ug | ORAL_TABLET | Freq: Every day | ORAL | 4 refills | Status: DC

## 2018-04-11 MED ORDER — INSULIN GLARGINE 100 UNIT/ML ~~LOC~~ SOLN: SUBCUTANEOUS | 3 refills | Status: DC

## 2018-04-11 NOTE — Progress Notes (Signed)
Pain: knees both, slight headache, and both feet. Knees and feet, patient stated it is because of the rain, 5. Headache last couple of days, 3.   Patient requested for all meds except glipizide.

## 2018-04-11 NOTE — Progress Notes (Signed)
Subjective:    Patient ID: Lynn Martinez, female    DOB: 18-Oct-1970, 47 y.o.   MRN: [id number]  HPI       47 year old female who is status post recent visit on 03/15/2018 to establish care.  Prior to her visit to establish care, patient had been seen in the emergency department on 03/06/2018 with complaint of nausea and dizziness and was found to have elevated blood sugar of 339 as patient had not taken her insulin on the day of her visit.  Patient was also found to have mild anemia with hemoglobin 11.9 and mild increase in white blood cell count at 11.3.  Patient with a history of poorly controlled, insulin requiring diabetes.  On 03/15/2018, patient's hemoglobin A1c was 11.1.  Patient's glucose at that visit was 298.  At today's visit, patient's blood sugars 244 and CMA reports that patient reported taking Lantus this morning prior to her visit.  Patient is also status post recent emergency department visit on 03/25/2018 secondary to headache.  On review of ED notes, patient was given an injection of Compazine and Benadryl by IV (Migraine Cocktail) with an improvement in her symptoms.      Patient reports that she is here today for follow-up of her diabetes.  Patient has a sheet of paper with her that she has been recording her blood sugars on.  Patient states that she has been taking the Lantus 47 units twice daily as prescribed however some days she decided to take the Lantus 3 times daily and she states that those times, she was able to get her blood sugar less than 200.  Patient states that she usually does not check her blood sugar more than once daily as she does not like to stick herself.  Patient also states that sometimes she does not take her medications as directed if she does not feel that she needs to take them that way.  Patient denies any increased thirst, no urinary frequency and no blurred vision at this time.  Patient with complaint of some abdominal discomfort in the sites where  she gives herself injections of insulin.  Patient states that she does try to rotate her sites of insulin injection but only gives herself injections in her frontal thighs or in her abdomen.  Patient denies any hypoglycemic episodes.  Patient reports that she has had a diabetic eye exam earlier this year.  Patient denies numbness in her feet but states that she occasionally has numbness and tingling in her fingertips.  Patient does have occasional issues with recurrent headaches/migraines but not at today's visit.  Patient states that she needs refills of most of her medications and let the CMA know which medications she needs.  Patient did bring the list of medications but states that this list is likely out of date.  Patient did remember to bring copies of lab work that she had had done earlier this year.      Patient reports that she needs a refill of her thyroid medication.  Patient reports that she has had prior surgical removal of her thyroid gland.  Patient denies any peripheral edema but has fatigue.  Patient denies constipation but she thinks that this is because she takes a stool softener daily.  Patient has been prescribed iron pills but states that she is not currently taking them.  Patient states that she took her Lantus this morning as well as the glipizide and Januvia but patient states that she has not  eaten this morning because she was told at her last visit that she should be fasting when she returned to be seen today.  Patient states that currently she feels fine.  Patient reports a history of urinary incontinence for which she takes a pill, (Detrol) but she cannot remember the exact milligram of the pill.  Patient also needs refill of her blood pressure medications and patient reports that she is taking her blood pressure medications daily and denies any current headaches or dizziness related to her blood pressure. Past Medical History:  Diagnosis Date  . Anemia   . Anxiety   . Arthritis      "knees" (09/30/2015)  . Bipolar disorder ([place])   . Depression   . Diabetes mellitus without complication ([place])   . Fibroid    s/p myomectomy 2010, hysterectomy 2013  . Grave's disease   . Heart murmur   . Hormone disorder   . Hypertension   . Hypothyroidism   . 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 Migraine     otc med prn  . PCOS (polycystic ovarian syndrome)   . PMS (premenstrual syndrome)   . PTSD (post-traumatic stress disorder)   . Seasonal allergies   . Thyroid cancer ([place]) 2005  . Thyroid disease   . Type II diabetes mellitus ([place])   . Vertigo    Past Surgical History:  Procedure Laterality Date  . ABDOMINAL HYSTERECTOMY  10/11/2011   Procedure: HYSTERECTOMY ABDOMINAL;  Surgeon: Terrance Mass, MD;  Location: [facility];  Service: Gynecology;  Laterality: N/A;  With Repair of serosa.  . ABDOMINAL HYSTERECTOMY    . CHOLECYSTECTOMY N/A 09/06/2013   Procedure: LAPAROSCOPIC CHOLECYSTECTOMY WITH INTRAOPERATIVE CHOLANGIOGRAM;  Surgeon: Merrie Roof, MD;  Location: [facility];  Service: General;  Laterality: N/A;  . DIAGNOSTIC LAPAROSCOPY    . DILATION AND CURETTAGE OF UTERUS    . HERNIA REPAIR    . MYOMECTOMY  2010   LAPAROSCOPY  . thyroid removed    . TOTAL THYROIDECTOMY  2006  . UMBILICAL HERNIA REPAIR  1982   Family History  Problem Relation Age of Onset  . Thyroid disease Mother   . Diabetes Father   . Hypertension Father   . Heart disease Father   . Heart attack Father   . Cancer Maternal Aunt        BRAIN  . Cancer Maternal Grandmother        LYMPHOMA  . Hypertension Maternal Grandmother   . Cancer Paternal Grandmother        PANCREATIC  . Diabetes Paternal Grandmother   . Hypertension Paternal Grandmother   . Hypertension Maternal Grandfather   . Hypertension Paternal Grandfather    Social History   Tobacco Use  . Smoking status: Never Smoker  . Smokeless tobacco: Never Used  Substance Use Topics  . Alcohol use: Never     Frequency: Never  . Drug use: Never   Allergies  Allergen Reactions  . Aspirin Hives  . Bee Venom   . Codeine Itching    Tolerates Hydrocodone OK.  . Codeine Itching  . Ibuprofen Other (See Comments)    Happened in childhood  . Ibuprofen Hives and Swelling  . Metformin And Related Other (See Comments)    Muscle weakness  . Morphine Other (See Comments)    Throat swelling  . Morphine And Related Itching  . Penicillins Other (See Comments)    Childhood reaction  Has patient had a PCN reaction causing immediate rash, facial/tongue/throat swelling, SOB or lightheadedness with hypotension: unknown Has patient had a PCN reaction causing severe rash involving mucus membranes or skin necrosis: unknown Has patient had a PCN reaction that required hospitalization unknown Has patient had a PCN reaction occurring within the last 10 years: unknown If all of the above answers are "NO", then may proceed with Cephalosporin use.  Marland Kitchen Penicillins     Childhood Allergy Has patient had a PCN reaction causing immediate rash, facial/tongue/throat swelling, SOB or lightheadedness with hypotension: Unknown Has patient had a PCN reaction causing severe rash involving mucus membranes or skin necrosis: Unknown Has patient had a PCN reaction that required hospitalization: Unknown Has patient had a PCN reaction occurring within the last 10 years: Unknown If all of the above answers are "NO", then may proceed with Cephalosporin use.   Diona Fanti [Aspirin] Swelling and Rash      Review of Systems  Constitutional: Negative for chills, fatigue and fever.  HENT: Negative for sore throat and trouble swallowing.   Respiratory: Negative for cough and shortness of breath.   Cardiovascular: Negative for chest pain, palpitations and leg swelling.  Gastrointestinal: Negative for abdominal pain, constipation, diarrhea and nausea.  Endocrine: Negative for polydipsia, polyphagia and polyuria.  Genitourinary: Negative  for dysuria, flank pain and frequency.  Musculoskeletal: Negative for back pain and gait problem.  Neurological: Positive for numbness. Negative for dizziness and headaches.       Objective:   Physical Exam BP 122/83   Pulse 73   Temp 97.8 F (36.6 C) (Oral)   Resp 18   Ht 5\' 10"  (1.778 m)   Wt 291 lb 12.8 oz (132.4 kg)   LMP 09/20/2011   SpO2 98%   BMI 41.87 kg/m Nurse's notes and vital signs reviewed General- well-nourished, well-developed obese adult female in no acute distress.  Patient is accompanied by her fianc at today's visit. ENT- TMs gray bilaterally, nares with mild edema of the nasal turbinates, patient with narrowed posterior airway secondary to body habitus (could not get patient to stay off for other maneuvers to help elevate the posterior palate therefore posterior airway/posterior pharynx was not well visualized), patient with a large tongue base. Neck-supple, no lymphadenopathy, borderline thyromegaly, no carotid bruit Lungs-clear to auscultation bilaterally Cardiovascular-regular rate and rhythm Abdomen- truncal obesity, soft, patient with complaint of some discomfort with palpation in the mid to lower abdomen, no rebound or guarding (patient reports tenderness of secondary to giving herself insulin injections) Back-no CVA tenderness Extremities-no edema Diabetic foot exam- patient with dry scaly skin on the inner arches of the feet and patient with dry, thickened skin on the soles of the feet bilaterally as well as some hyperkeratotic areas at the bases of the toes bilaterally.  Patient with 1+ posterior tibial and dorsalis pedis pulses.  Patient does not have any thickening of the toenails or changes in color.  Patient with abnormal monofilament exam with absent sensation on the right heel and decreased sensation on the rest of the right foot for 10 out of 10 areas tested.  On the left foot, patient with normal monofilament exam other than decreased sensation on the  left ball of the foot and left heel.        Assessment & Plan:  1. Insulin dependent diabetes mellitus ([place]) Patient reports that she sometimes misses some of her doses of Lantus and at times has taken her Lantus three times per day instead of  the prescribed twice daily and I discussed with the patient that this could be dangerous and lead to her blood sugars becoming too low. Patient's blood sugar diary shows that her blood sugars if accurate are remaining in the mid 200's to 300's when she monitors them. I am increasing her Lantus to 50 units twice per day and patient has been asked to follow-up with the clinical pharmacist in about 2 weeks and to bring her glucometer and blood sugar monitor. Patient may benefit from a medication such as Victoza but patient does not seem to exhibit good judgement about how she is supposed to take her medications to help control her blood sugar. I am also not sure that she is receiving any benefit from the use of Januvia. Patient is also not on an ace inhibitor or ARB but I am not sure if she has been on these in the past. These are not listed on her allergy list but she has apparently not been able to tolerate metformin in the past. Patient is also not currently on a statin medication. Lipid panel will be done today and patient will be contacted with recommendations regarding statin medication. Diabetic foot care was discussed and patient will also be referred to podiatry.  - Glucose ([facility]) - Comprehensive metabolic panel - POCT URINALYSIS DIP (CLIN[facility]) - Microalbumin/Creatinine Ratio, Urine - Ambulatory referral to Podiatry - insulin glargine (LANTUS) 100 UNIT/ML injection; Inject 50 units into the skin twice per day  Dispense: 10 mL; Refill: 3 - sitaGLIPtin (JANUVIA) 100 MG tablet; Take 1 tablet (100 mg total) by mouth daily.  Dispense: 30 tablet; Refill: 3 - Lipid Panel  2. Postsurgical hypothyroidism Patient reports surgical removal of the thyroid and  patient will have TSH and free T4 to see if she is on the proper dose of levothyroxine.  Patient will be notified of the change of her thyroid medication will need to be changed based on her results from today's visit.  Patient is provided with refill of her thyroid medication. - TSH + free T4 - levothyroxine (SYNTHROID, LEVOTHROID) 125 MCG tablet; Take 1 tablet (125 mcg total) by mouth daily before breakfast.  Dispense: 30 tablet; Refill: 4  3. Essential hypertension Patient with hypertension her blood pressure appears to be controlled at today's visit.  Patient is on Catapres 0.1 mg twice daily.  I am not sure why she is not on an ACE inhibitor or an ARB given that she has poorly controlled diabetes.  Patient also appears to be on propranolol 40 mg twice daily but this may be more so for prevention of migraines rather than solely for control of blood pressure.  Patient will have a urine microalbumin done at today's visit. - cloNIDine (CATAPRES) 0.1 MG tablet; Take 1 tablet (0.1 mg total) by mouth 2 (two) times daily.  Dispense: 60 tablet; Refill: 5  4. Rash and nonspecific skin eruption Patient with dry skin on her feet and patient also with complaint of itchiness of her feet.  Patient also with some hyperkeratotic lesions on the soles of the feet.  Patient will be referred to podiatry for further evaluation and treatment - Ambulatory referral to Podiatry  5. Morbid obesity with BMI of 40.0-44.9, adult (HCC) Dietary changes and regular exercise to help of weight loss encouraged.  Patient will also have upcoming meeting with endocrinology to see if she might be a candidate for medication for control of diabetes that may also contribute to weight loss.  She will  6.  Urinary incontinence, mixed Patient with urinary incontinence and has been on Detrol however she is not sure if her most recent dose was 4 mg or 2 mg.  In review of recent ED notes, patient appears to have been on 2 mg but patient also  has a list of her medications that seem to indicate use of 4 mg.  As patient has self-reported many of her medication doses to the CMA at today's visit, CMA was asked to contact patient's pharmacy to ask for list of current medications and doses so that refills can be done with appropriate medications and doses. Addendum: A list of patient's most recently filled medications was obtained from [id number] as well as since 04/2017.  Detrol/oxybutynin was not on the list of medications that patient had revealed at this pharmacy over the past year.  Per pharmacy records, in the past 90 days, patient has filled Colace 100 mg once daily, glipizide 10 mg- three times daily, hydroxyzine 25 mg twice daily, propranolol 40 mg twice daily, prazosin 1 mg at bedtime, clonidine 0.1 mg twice daily, poly--iron 150 mg capsule twice daily with food, vitamin D3 at one thousand international units  twice daily and lithium ER 450 mg taking 2 at bedtime.  Patient also filled insulin syringes 3 times daily.  7.  Noncompliance with medications (through intermittent use) Patient admits that she does not always take her medications as prescribed as she sometimes forgets to take her medications or does not feel that she needs her medications.  Patient also with a history her past records of bipolar disorder, anxiety and depression, and PTSD and I am not sure how her mental health conditions may be playing a role in her inability to remain compliant with her medications or to understand the importance of compliance with her medications.  *Influenza immunization was offered to the patient at today's visit but she declined  An After Visit Summary was printed and given to the patient.  Return for DM-Luke in 2 weeks and 6 weeks with PCP.

## 2018-04-12 LAB — COMPREHENSIVE METABOLIC PANEL WITH GFR
ALT: 20 IU/L (ref 0–32)
AST: 13 IU/L (ref 0–40)
Albumin/Globulin Ratio: 1.2 (ref 1.2–2.2)
Albumin: 3.9 g/dL (ref 3.5–5.5)
Alkaline Phosphatase: 82 IU/L (ref 39–117)
BUN/Creatinine Ratio: 10 (ref 9–23)
BUN: 7 mg/dL (ref 6–24)
Bilirubin Total: <0.2 mg/dL (ref 0.0–1.2)
CO2: 22 mmol/L (ref 20–29)
Calcium: 9.8 mg/dL (ref 8.7–10.2)
Chloride: 98 mmol/L (ref 96–106)
Creatinine, Ser: 0.73 mg/dL (ref 0.57–1.00)
GFR calc Af Amer: 116 mL/min/1.73
GFR calc non Af Amer: 100 mL/min/1.73
Globulin, Total: 3.2 g/dL (ref 1.5–4.5)
Glucose: 239 mg/dL — ABNORMAL HIGH (ref 65–99)
Potassium: 4.3 mmol/L (ref 3.5–5.2)
Sodium: 136 mmol/L (ref 134–144)
Total Protein: 7.1 g/dL (ref 6.0–8.5)

## 2018-04-12 LAB — MICROALBUMIN / CREATININE URINE RATIO
Creatinine, Urine: 164.8 mg/dL
Microalb/Creat Ratio: 5.5 mg/g{creat} (ref 0.0–30.0)
Microalbumin, Urine: 9 ug/mL

## 2018-04-12 LAB — LIPID PANEL
Chol/HDL Ratio: 3.3 ratio (ref 0.0–4.4)
Cholesterol, Total: 130 mg/dL (ref 100–199)
HDL: 40 mg/dL
LDL Calculated: 66 mg/dL (ref 0–99)
Triglycerides: 120 mg/dL (ref 0–149)
VLDL Cholesterol Cal: 24 mg/dL (ref 5–40)

## 2018-04-12 LAB — TSH+FREE T4
Free T4: 1.32 ng/dL (ref 0.82–1.77)
TSH: 6.96 u[IU]/mL — ABNORMAL HIGH (ref 0.450–4.500)

## 2018-04-13 ENCOUNTER — Other Ambulatory Visit: Payer: Self-pay | Admitting: Family Medicine

## 2018-04-13 ENCOUNTER — Ambulatory Visit: Payer: BLUE CROSS/BLUE SHIELD | Admitting: Podiatry

## 2018-04-13 ENCOUNTER — Encounter: Payer: Self-pay | Admitting: Podiatry

## 2018-04-13 VITALS — BP 141/82 | HR 78

## 2018-04-13 DIAGNOSIS — E119 Type 2 diabetes mellitus without complications: Secondary | ICD-10-CM | POA: Diagnosis not present

## 2018-04-13 DIAGNOSIS — B353 Tinea pedis: Secondary | ICD-10-CM

## 2018-04-13 MED ORDER — TOLTERODINE TARTRATE ER 4 MG PO CP24: 4.0000 mg | ORAL_CAPSULE | Freq: Every day | ORAL | 2 refills | Status: DC

## 2018-04-13 MED ORDER — HYDROXYZINE HCL 25 MG PO TABS: 25.0000 mg | ORAL_TABLET | Freq: Two times a day (BID) | ORAL | 2 refills | Status: DC

## 2018-04-13 MED ORDER — PROPRANOLOL HCL 40 MG PO TABS: 40.0000 mg | ORAL_TABLET | Freq: Two times a day (BID) | ORAL | 2 refills | Status: DC

## 2018-04-13 NOTE — Telephone Encounter (Signed)
Dr Chapman Fitch: This patient was last seen on 04/11/18 and is requesting these meds, they are all listed under the historical provider, please refill if appropriate.  [place]: The vitamin B and stool softener are both OTC and available for purchase at the pharmacy here without a prescription, Dr. Chapman Fitch will have to review and decide if refills are appropriate since those prescriptions have not been filled by any of our providers yet.

## 2018-04-13 NOTE — Telephone Encounter (Signed)
1) Medication(s) Requested (by name):  2) Pharmacy of Choice: [facility]   3) Special Requests: TOLTERODINE TARTRATE PO propranolol (INDERAL) 40 MG tablet  hydrOXYzine (ATARAX/VISTARIL) 25 MG tablet Vit D3 Stool Softer    Approved medications will be sent to the pharmacy, we will reach out if there is an issue.  Requests made after 3pm may not be addressed until the following business day!  If a patient is unsure of the name of the medication(s) please note and ask patient to call back when they are able to provide all info, do not send to responsible party until all information is available!

## 2018-04-13 NOTE — Telephone Encounter (Signed)
There was a question at patient's recent visit about the Detrol.  Prescription list was obtained from patient's pharmacy, [id number] where patient stated that she had had her medications most recently filled.  Patient had not had Detrol filled there in the past 90 days or in the past 1 year.  Patient had one list of medications which listed 2 mg Detrol and one listed 4 mg Detrol.  I can refill the requested Detrol but I have no actual proof that she was ever on this medication as I could never find an initial prescriber and medication was only listed on her chart because she reported this medication she had taken.

## 2018-04-13 NOTE — Progress Notes (Signed)
This patient presents to the office with chief complaint of bump filled skin lesions and diabetic feet.  This patient  says there  Is pain in her skin lesions so she busts the blisters/bumps..  .    She says these bumps develop into callus after the blisters are cut open  .  She says she has been dealing with this bump/blister problem for years. This patient presents  to the office today for treatment of her skin lesions  and a foot evaluation due to history of  Diabetes.  She says her sugar is about 250.  General Appearance  Alert, conversant and in no acute stress.  Vascular  Dorsalis pedis and posterior tibial  pulses are weakly  palpable  bilaterally.  Capillary return is within normal limits  bilaterally. Temperature is within normal limits  Bilaterally. Hair present on feet/digits.  Neurologic  Senn-Weinstein monofilament wire test within normal limits  bilaterally. Muscle power within normal limits bilaterally.  Nails Thick disfigured discolored nails with subungual debris  from hallux to fifth toes bilaterally. No evidence of bacterial infection or drainage bilaterally.  Orthopedic  No limitations of motion of motion feet .  No crepitus or effusions noted.  No bony pathology or digital deformities noted.  Skin  normotropic skin with no porokeratosis noted bilaterally.  No signs of infections or ulcers noted.   There are multiple dry callused areas on the bottom of both feet.  There are dried areas with peeling skin noted.  No evidence of any fluid-filled blisters today.  Tinea mentagrophytes  Diabetes with no complications.  IE discussed her skin condition with this patient.  She sounds like she has been dealing with tinea mentagrophytes for years.  She was told to use powders in an effort to prevent moisture.  She is also admonished to use her Lamisil medication to her feet daily.  Patient is to return to the office if this problem worsens and a new prescription for tinea pedis will be  prescribed . A diabetic foot exam was performed and there is no evidence of any vascular or neurologic pathology.   RTC 3 months.   Gardiner Barefoot DPM

## 2018-04-16 ENCOUNTER — Emergency Department (HOSPITAL_COMMUNITY)
Admission: EM | Admit: 2018-04-16 | Discharge: 2018-04-16 | Disposition: A | Discharge status: Home / Self Care | Payer: BLUE CROSS/BLUE SHIELD | Attending: Emergency Medicine | Admitting: Emergency Medicine

## 2018-04-16 ENCOUNTER — Encounter (HOSPITAL_COMMUNITY): Payer: Self-pay | Admitting: Emergency Medicine

## 2018-04-16 ENCOUNTER — Ambulatory Visit (HOSPITAL_BASED_OUTPATIENT_CLINIC_OR_DEPARTMENT_OTHER): Payer: BLUE CROSS/BLUE SHIELD

## 2018-04-16 ENCOUNTER — Other Ambulatory Visit: Payer: Self-pay

## 2018-04-16 DIAGNOSIS — E039 Hypothyroidism, unspecified: Secondary | ICD-10-CM | POA: Insufficient documentation

## 2018-04-16 DIAGNOSIS — E119 Type 2 diabetes mellitus without complications: Secondary | ICD-10-CM | POA: Diagnosis not present

## 2018-04-16 DIAGNOSIS — Z794 Long term (current) use of insulin: Secondary | ICD-10-CM | POA: Insufficient documentation

## 2018-04-16 DIAGNOSIS — R52 Pain, unspecified: Secondary | ICD-10-CM

## 2018-04-16 DIAGNOSIS — M25562 Pain in left knee: Secondary | ICD-10-CM | POA: Insufficient documentation

## 2018-04-16 DIAGNOSIS — Z79899 Other long term (current) drug therapy: Secondary | ICD-10-CM | POA: Diagnosis not present

## 2018-04-16 DIAGNOSIS — M79605 Pain in left leg: Secondary | ICD-10-CM | POA: Diagnosis not present

## 2018-04-16 DIAGNOSIS — I10 Essential (primary) hypertension: Secondary | ICD-10-CM | POA: Insufficient documentation

## 2018-04-16 MED ORDER — ACETAMINOPHEN 325 MG PO TABS
650.0000 mg | ORAL_TABLET | Freq: Once | ORAL | Status: AC
  Administered 2018-04-16: 650 mg via ORAL
  Filled 2018-04-16: qty 2

## 2018-04-16 NOTE — ED Triage Notes (Signed)
Pt reports that she has left knee and ankle pain that started today. Reports pain so bad had to leave work. Denies any falls or injuries.

## 2018-04-16 NOTE — ED Notes (Signed)
Notified by Vascular tech, negative for DVT

## 2018-04-16 NOTE — Progress Notes (Signed)
*  Preliminary Results* Left lower extremity venous duplex completed. Left lower extremity is negative for deep vein thrombosis. There is no evidence of left Baker's cyst.  04/16/2018 4:20 PM  Maudry Mayhew, MHA, RVT, RDCS, RDMS

## 2018-04-16 NOTE — ED Notes (Signed)
Pt is alert and oriented x 4 and is verbally responsive. Pt reports that she has left knee pain that started x1 day. Pt does state that she has been increasing her walking. No abnormalities are noted. Pt reports 10/10 pain that radiates down to her calf.

## 2018-04-16 NOTE — ED Provider Notes (Signed)
[facility] DEPT Provider Note   CSN: [id number] Arrival date & time: 04/16/18  1234     History   Chief Complaint Chief Complaint  Patient presents with  . Knee Pain  . Ankle Pain    HPI Lynn Martinez is a 47 y.o. female.  HPI   Pt is a 47 y/o female with a history of anemia, anxiety, bipolar disorder, depression, diabetes, Graves' disease, heart murmur, hypertension, PCOS, thyroid cancer, who presents emergency department today for evaluation of left knee and ankle pain that started today.  Patient states she has pain to the posterior aspect of the left knee.  It radiates down her leg and she also has pain to the medial aspect of the left lower leg.  Denies any noticeable swelling or redness to the lower extremity.  No recent falls or trauma.  Pain is constant and "feels like I pulled something ".  Worse with ambulation and palpation.  Has not tried any interventions for her symptoms.  Past Medical History:  Diagnosis Date  . Anemia   . Anxiety   . Arthritis    "knees" (09/30/2015)  . Bipolar disorder ([place])   . Depression   . Diabetes mellitus without complication ([place])   . Fibroid    s/p myomectomy 2010, hysterectomy 2013  . Grave's disease   . Heart murmur   . Hormone disorder   . Hypertension   . Hypothyroidism   . 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 Migraine     otc med prn  . PCOS (polycystic ovarian syndrome)   . PMS (premenstrual syndrome)   . PTSD (post-traumatic stress disorder)   . Seasonal allergies   . Thyroid cancer ([place]) 2005  . Thyroid disease   . Type II diabetes mellitus ([place])   . Vertigo     Patient Active Problem List   Diagnosis Date Noted  . Pain in the chest 09/30/2015  . Hypertension 09/30/2015  . Insulin dependent diabetes mellitus ([place]) 09/30/2015  . Chest pain 09/30/2015  . Cholelithiasis with acute cholecystitis 09/05/2013  . Borderline behavior 03/14/2012   Class: Chronic  . OCD (obsessive compulsive disorder) 03/14/2012    Class: Chronic  . PTSD (post-traumatic stress disorder) 03/14/2012    Class: Chronic  . 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 Lumbago without sciatica 03/12/2012  . Major depression, recurrent ([place]) 03/08/2012    Class: Chronic  . Snoring 12/02/2011  . PCOS (polycystic ovarian syndrome) 09/05/2011  . Hypothyroidism 08/13/2010  . VITAMIN D DEFICIENCY 08/13/2010  . ANEMIA-NOS 08/13/2010    Past Surgical History:  Procedure Laterality Date  . ABDOMINAL HYSTERECTOMY  10/11/2011   Procedure: HYSTERECTOMY ABDOMINAL;  Surgeon: Terrance Mass, MD;  Location: [facility];  Service: Gynecology;  Laterality: N/A;  With Repair of serosa.  . ABDOMINAL HYSTERECTOMY    . CHOLECYSTECTOMY N/A 09/06/2013   Procedure: LAPAROSCOPIC CHOLECYSTECTOMY WITH INTRAOPERATIVE CHOLANGIOGRAM;  Surgeon: Merrie Roof, MD;  Location: [facility];  Service: General;  Laterality: N/A;  . DIAGNOSTIC LAPAROSCOPY    . DILATION AND CURETTAGE OF UTERUS    . HERNIA REPAIR    . MYOMECTOMY  2010   LAPAROSCOPY  . thyroid removed    . TOTAL THYROIDECTOMY  2006  . UMBILICAL HERNIA REPAIR  1982     OB History    Gravida  2   Para  0   Term  0   Preterm  0   AB  2   Living        SAB  2   TAB  0   Ectopic  0   Multiple      Live Births               Home Medications    Prior to Admission medications   Medication Sig Start Date End Date Taking? Authorizing Provider  cholecalciferol (VITAMIN D) 1000 UNITS tablet Take 2,000 Units by mouth daily.  03/14/12  Yes Darrol Jump, MD  cloNIDine (CATAPRES) 0.1 MG tablet Take 1 tablet (0.1 mg total) by mouth 2 (two) times daily. 04/11/18  Yes Fulp, Cammie, MD  docusate sodium (COLACE) 100 MG capsule Take 2 capsules (200 mg total) by mouth every morning. For stool softner Patient taking differently: Take 100 mg by mouth at bedtime. For stool softner 03/14/12  Yes  Darrol Jump, MD  ferrous sulfate 325 (65 FE) MG tablet Take 1 tablet (325 mg total) by mouth daily with breakfast. For low iron Patient taking differently: Take 325 mg by mouth 2 (two) times daily with a meal. For low iron 03/14/12  Yes Darrol Jump, MD  glipiZIDE (GLUCOTROL) 10 MG tablet Take 1 tablet (10 mg total) by mouth daily before breakfast. 03/15/18  Yes McClung, Angela M, PA-C  hydrOXYzine (ATARAX/VISTARIL) 25 MG tablet Take 1 tablet (25 mg total) by mouth 2 (two) times daily. 04/13/18  Yes Fulp, Cammie, MD  insulin glargine (LANTUS) 100 UNIT/ML injection Inject 50 units into the skin twice per day 04/11/18  Yes Fulp, Cammie, MD  levothyroxine (SYNTHROID, LEVOTHROID) 125 MCG tablet Take 1 tablet (125 mcg total) by mouth daily before breakfast. 04/11/18  Yes Fulp, Cammie, MD  lithium carbonate 300 MG capsule Take 600 mg by mouth at bedtime.    Yes [provider]  lurasidone (LATUDA) 80 MG TABS tablet Take 80 mg by mouth at bedtime.   Yes [provider]  prazosin (MINIPRESS) 1 MG capsule Take 1 capsule (1 mg total) by mouth at bedtime and may repeat dose one time if needed. For sleep/nightmares 03/14/12  Yes Darrol Jump, MD  propranolol (INDERAL) 40 MG tablet Take 1 tablet (40 mg total) by mouth 2 (two) times daily. 04/13/18  Yes Fulp, Cammie, MD  sitaGLIPtin (JANUVIA) 100 MG tablet Take 1 tablet (100 mg total) by mouth daily. 04/11/18  Yes Fulp, Cammie, MD  tolterodine (DETROL LA) 4 MG 24 hr capsule Take 1 capsule (4 mg total) by mouth daily. 04/13/18  Yes Fulp, Cammie, MD  diclofenac sodium (VOLTAREN) 1 % GEL Apply 2 g topically daily as needed.    [provider]  HYDROcodone-acetaminophen (NORCO/VICODIN) 5-325 MG tablet Take 1 tablet by mouth every 4 (four) hours as needed. Patient not taking: Reported on 04/16/2018 02/21/18   Ward, Delice Bison, DO  ondansetron (ZOFRAN ODT) 4 MG disintegrating tablet Take 1 tablet (4 mg total) by mouth every 6 (six) hours as  needed for nausea or vomiting. Patient not taking: Reported on 04/16/2018 02/21/18   Ward, Delice Bison, DO    Family History Family History  Problem Relation Age of Onset  . Thyroid disease Mother   . Diabetes Father   . Hypertension Father   . Heart disease Father   . Heart attack Father   . Cancer Maternal Aunt        BRAIN  . Cancer Maternal Grandmother  LYMPHOMA  . Hypertension Maternal Grandmother   . Cancer Paternal Grandmother        PANCREATIC  . Diabetes Paternal Grandmother   . Hypertension Paternal Grandmother   . Hypertension Maternal Grandfather   . Hypertension Paternal Grandfather     Social History Social History   Tobacco Use  . Smoking status: Never Smoker  . Smokeless tobacco: Never Used  Substance Use Topics  . Alcohol use: Never    Frequency: Never  . Drug use: Never     Allergies   Aspirin; Bee venom; Codeine; Codeine; Ibuprofen; Ibuprofen; Metformin and related; Morphine; Morphine and related; Penicillins; Penicillins; and Asa [aspirin]   Review of Systems Review of Systems  Constitutional: Negative for chills and fever.  Respiratory: Negative for shortness of breath.   Cardiovascular: Negative for chest pain and leg swelling.  Musculoskeletal:       LLE pain  Skin: Negative for color change, rash and wound.     Physical Exam Updated Vital Signs BP 134/76 (BP Location: Right Arm)   Pulse 69   Temp 97.8 F (36.6 C) (Oral)   Resp 18   LMP 09/20/2011   SpO2 100%   Physical Exam  Constitutional: She appears well-developed and well-nourished. No distress.  HENT:  Head: Normocephalic and atraumatic.  Eyes: Conjunctivae are normal.  Neck: Neck supple.  Cardiovascular: Normal rate, regular rhythm, normal heart sounds and intact distal pulses.  Pulmonary/Chest: Effort normal and breath sounds normal. She has no wheezes.  Musculoskeletal: Normal range of motion.  No calf TTP, erythema, swelling.  There is tenderness to the  posterior aspect of the left knee with no appreciable swelling.  There is also tenderness to the anterior aspect of the knee (chronic per patient 2/2 arthritis).  No tenderness the medial lateral malleolus.  Ambulatory with steady gait.  Normal range of motion of the bilateral lower extremities.  No joint laxity of the knee. Normal sensation. No erythema or warmth to the knee/ankle joint.   Neurological: She is alert.  Skin: Skin is warm and dry.  Psychiatric: She has a normal mood and affect.  Nursing note and vitals reviewed.    ED Treatments / Results  Labs (all labs ordered are listed, but only abnormal results are displayed) Labs Reviewed - No data to display  EKG None  Radiology No results found.  Procedures Procedures (including critical care time)  Medications Ordered in ED Medications  acetaminophen (TYLENOL) tablet 650 mg (650 mg Oral Given 04/16/18 1518)     Initial Impression / Assessment and Plan / ED Course  I have reviewed the triage vital signs and the nursing notes.  Pertinent labs & imaging results that were available during my care of the patient were reviewed by me and considered in my medical decision making (see chart for details).    Final Clinical Impressions(s) / ED Diagnoses   Final diagnoses:  Acute pain of left knee   Patient with a history of bilateral knee arthritis presents the ED today complaining of pain behind the left knee that has been present since today.  Denies injury or trauma.  She has tenderness to the posterior aspect of the knee as well as to the medial aspect of the left lower tibia.  There is no swelling to the ankle or knee joint to suggest a septic joint.  She has no swelling to the lower extremity and has no risk factors for DVT/PE however given her posterior knee pain lower extreme the ultrasound  was ordered which was negative for DVT or Baker's cyst.  Her symptoms may be due to her underlying arthritis versus tendinopathy.  Will  provide knee sleeve and have her follow-up with orthopedics as an outpatient.  We will have her follow-up if she has any new or worsening symptoms.  She voices understanding the plan reasons to return to ED.  All questions answered.  ED Discharge Orders    None       Rodney Booze, [place] 04/16/18 1641    Gareth Morgan, MD 04/19/18 0009

## 2018-04-16 NOTE — Discharge Instructions (Signed)
You may rotate tylenol and motrin for your symptoms at home. You were given a knee sleeve to help with the discomfort you are having. You may use this as needed.   Please follow up with your primary care provider within 5-7 days for re-evaluation of your symptoms. If you do not have a primary care provider, information for a healthcare clinic has been provided for you to make arrangements for follow up care. Please return to the emergency department for any new or worsening symptoms.

## 2018-04-20 ENCOUNTER — Telehealth: Payer: Self-pay | Admitting: *Deleted

## 2018-04-20 NOTE — Telephone Encounter (Signed)
-----   Message from Antony Blackbird, MD sent at 04/19/2018 12:19 AM EST ----- Please notify patient that her thyroid labs indicate that she should remain on her current dose of levothyroxine at 125 mcg daily.  Complete metabolic panel was normal with the exception of elevated glucose at 239.  Patient with normal urine microalbumin ratio.  Lipid panel was within normal.  Patient needs to make sure to take her medications, follow diabetic diet and continue to monitor blood sugars and notify office if fasting blood sugars are remaining higher than 140-160 consistently.  Keep scheduled follow-up appointments

## 2018-04-20 NOTE — Telephone Encounter (Signed)
Medical Assistant left message on patient's home and cell voicemail. Voicemail states to give a call back to [place] with [facility] at [phone]. !!!Patient needs to continue with current dose of thyroid medication. Patients other labs are normal except for the sugar level and needing to take all medications as prescribed and limit sugar and carb intake. Patient needs to keep follow up appointment!!!

## 2018-04-25 ENCOUNTER — Encounter: Payer: Self-pay | Admitting: Pharmacist

## 2018-04-25 ENCOUNTER — Ambulatory Visit: Payer: BLUE CROSS/BLUE SHIELD | Attending: Family Medicine | Admitting: Pharmacist

## 2018-04-25 VITALS — BP 100/66 | HR 70

## 2018-04-25 DIAGNOSIS — Z794 Long term (current) use of insulin: Secondary | ICD-10-CM

## 2018-04-25 DIAGNOSIS — Z9114 Patient's other noncompliance with medication regimen: Secondary | ICD-10-CM | POA: Diagnosis not present

## 2018-04-25 DIAGNOSIS — Z833 Family history of diabetes mellitus: Secondary | ICD-10-CM | POA: Insufficient documentation

## 2018-04-25 DIAGNOSIS — Z79899 Other long term (current) drug therapy: Secondary | ICD-10-CM | POA: Insufficient documentation

## 2018-04-25 DIAGNOSIS — E1165 Type 2 diabetes mellitus with hyperglycemia: Secondary | ICD-10-CM

## 2018-04-25 DIAGNOSIS — E119 Type 2 diabetes mellitus without complications: Secondary | ICD-10-CM

## 2018-04-25 DIAGNOSIS — F3132 Bipolar disorder, current episode depressed, moderate: Secondary | ICD-10-CM | POA: Diagnosis not present

## 2018-04-25 DIAGNOSIS — I10 Essential (primary) hypertension: Secondary | ICD-10-CM | POA: Insufficient documentation

## 2018-04-25 DIAGNOSIS — Z23 Encounter for immunization: Secondary | ICD-10-CM

## 2018-04-25 DIAGNOSIS — Z8249 Family history of ischemic heart disease and other diseases of the circulatory system: Secondary | ICD-10-CM | POA: Diagnosis not present

## 2018-04-25 DIAGNOSIS — IMO0001 Reserved for inherently not codable concepts without codable children: Secondary | ICD-10-CM

## 2018-04-25 LAB — GLUCOSE, POCT (MANUAL RESULT ENTRY): POC Glucose: 220 mg/dL — AB (ref 70–99)

## 2018-04-25 MED ORDER — INSULIN ASPART 100 UNIT/ML FLEXPEN: 10.0000 [IU] | PEN_INJECTOR | Freq: Every day | SUBCUTANEOUS | 11 refills | Status: DC

## 2018-04-25 MED ORDER — TETANUS-DIPHTH-ACELL PERTUSSIS 5-2.5-18.5 LF-MCG/0.5 IM SUSP
0.5000 mL | Freq: Once | INTRAMUSCULAR | Status: AC
  Administered 2018-04-25: 0.5 mL via INTRAMUSCULAR

## 2018-04-25 MED ORDER — INSULIN PEN NEEDLE 32G X 4 MM MISC: 11 refills | Status: DC

## 2018-04-25 MED ORDER — INSULIN GLARGINE 100 UNIT/ML SOLOSTAR PEN: 50.0000 [IU] | PEN_INJECTOR | Freq: Two times a day (BID) | SUBCUTANEOUS | 11 refills | Status: DC

## 2018-04-25 NOTE — Patient Instructions (Signed)
Thank you for coming to see me today. Please do the following:  1. Start taking Lantus 50 units BID.  2. Start taking Novolog 10 units before dinner.  3. Start taking clonidine 0.1 mg daily instead of twice daily. We will eventually get you off of this and on losartan. 4. Continue checking blood sugars at home. Start checking fasting and 2 hours after dinner.   5. Continue making the lifestyle changes we've discussed together during our visit.  6. Follow-up with me in 2 weeks.   Hypoglycemia or low blood sugar:   Low blood sugar can happen quickly and may become an emergency if not treated right away.   While this shouldn't happen often, it can be brought upon if you skip a meal or do not eat enough. Also, if your insulin or other diabetes medications are dosed too high, this can cause your blood sugar to go to low.   Warning signs of low blood sugar include: 1. Feeling shaky or dizzy 2. Feeling weak or tired  3. Excessive hunger 4. Feeling anxious or upset  5. Sweating even when you aren't exercising  What to do if I experience low blood sugar? 1. Check your blood sugar with your meter. If lower than 70, proceed to step 2.  2. Treat with 3-4 glucose tablets or 3 packets of regular sugar. If these aren't around, you can try hard candy. Yet another option would be to drink 4 ounces of fruit juice or 6 ounces of REGULAR soda.  3. Re-check your sugar in 15 minutes. If it is still below 70, do what you did in step 2 again. If has come back up, go ahead and eat a snack or small meal at this time.

## 2018-04-25 NOTE — Progress Notes (Signed)
S:    PCP: Dr. Chapman Fitch  No chief complaint on file.  Patient arrives in good spirits. Presents for diabetes management at the request of Dr. Chapman Fitch. Patient was referred on 04/11/18. Lantus was increased to 50 units BID at that visit. Of note, patient reports pain with injections.   Family/Social History:  - FHx: DM, HTN, and MI in father - Tobacco: never smoker - Alcohol: denies  Insurance coverage/medication affordability:  - BCBS  Patient denies adherence with medications.  Current diabetes medications include:  - Glipizide 10 mg daily. Reports taking TID. - Lantus 50 units BID. Taking only 47-48 units BID.  - Januvia 100 mg daily  Previously tried:  - Metformin (d/c d/t reported muscle weakness) - Januvia 50 mg daily   Patient reports occasional relative hypoglycemia. No objects values <70.  Patient reported dietary habits: - Eggs, sausage, and grits - chicken and vegetables for lunch/dinner - Drinks sweet-tea  Patient-reported exercise habits:  - Does not exercise - Does get 2,000 steps a day   Patient reports polyuria, polydipsia. Denies polyphagia.  Patient denies neuropathy. Patient denies visual changes. Patient reports self foot exams.   O:  POCT: 220. Fasting Home fasting: 209 - 393  Lab Results  Component Value Date   HGBA1C 11.1 (A) 03/15/2018   HGBA1C 11.1 03/15/2018   HGBA1C 11.1 (A) 03/15/2018   HGBA1C 11.1 (A) 03/15/2018   There were no vitals filed for this visit.  Lipid Panel     Component Value Date/Time   CHOL 130 04/11/2018 1224   TRIG 120 04/11/2018 1224   HDL 40 04/11/2018 1224   CHOLHDL 3.3 04/11/2018 1224   CHOLHDL 3.4 09/30/2015 0611   VLDL 22 09/30/2015 0611   LDLCALC 66 04/11/2018 1224   Clinical ASCVD: No  10 year ASCVD risk: cannot calculate d/t LDL < 70   A/P: Diabetes longstanding currently uncontrolled. Patient is able to verbalize appropriate hypoglycemia management plan. Patient is not adherent with medication.  Control is suboptimal due to medication non-compliance, poor injection technique, and dietary indiscretion.  Insulin indicated with A1c and associated hyperglycemia symptoms. She does endorse some relative hypoglycemia but her home glucose levels are never below the 200s. Pt with reported hx of thyroid cancer but is s/p thyroidectomy. Upon review of her charts, I can't track who treated her thyroid cancer. Would need clarification of this to determine if a  GLP-1 RA is appropriate in this patient. She is unable to tolerate metformin. Will have her decrease glipizide to prescribed once-daily dosing.   Injection technique poor. Demonstrated proper technique. Will have patient switch to the pens for ease of administration.   -Continued Lantus 50 units BID. Instructed pt to take as prescribed instead of 47-48 units BID.  -Started Novolog 10 units before supper (largest meal). -Continued Januvia and glipizide for now. -Extensively discussed pathophysiology of DM, recommended lifestyle interventions, dietary effects on glycemic control -Counseled on s/sx of and management of hypoglycemia -Next A1C anticipated 06/2018.   ASCVD risk - primary prevention in patient with DM. Last LDL is controlled. ASCVD risk score cannot be calculated with LDL < 70. However, bc of the independent risk associated with DM, pt would benefit from at least moderate intensity statin.  - Does not wish to start today.  Hypertension longstanding currently controlled.  BP goal <130/80 mmHg. Patient is adherent with clonidine, however, this is not first-line for HTN. Will taper off of clonidine. Will re-assess for initiation of a first-line medication at  a future encounter. -Start taking clonidine 0.1 mg daily  Written patient instructions provided.  Total time in face to face counseling 30 minutes.   Follow up Pharmacist Clinic Visit in 2 weeks.     Patient seen with:  Dixon Boos, PharmD Candidate [employer] 2021  Benard Halsted, PharmD, [place] [phone]

## 2018-05-01 ENCOUNTER — Telehealth: Payer: Self-pay | Admitting: Family Medicine

## 2018-05-01 ENCOUNTER — Other Ambulatory Visit: Payer: Self-pay | Admitting: Pharmacist

## 2018-05-01 MED ORDER — LOSARTAN POTASSIUM 50 MG PO TABS: 50.0000 mg | ORAL_TABLET | Freq: Every day | ORAL | 3 refills | Status: DC

## 2018-05-01 NOTE — Telephone Encounter (Signed)
Done. Losartan 50 mg sent in to our pharmacy here.

## 2018-05-01 NOTE — Telephone Encounter (Signed)
Patient called to inform that they have 2 doses of the Clonidine left. Patient says she was told to update when she is almost done in order to switch over to losartan. Please follow up.

## 2018-05-02 DIAGNOSIS — F3132 Bipolar disorder, current episode depressed, moderate: Secondary | ICD-10-CM | POA: Diagnosis not present

## 2018-05-06 ENCOUNTER — Encounter (HOSPITAL_COMMUNITY): Payer: Self-pay | Admitting: Emergency Medicine

## 2018-05-06 ENCOUNTER — Emergency Department (HOSPITAL_COMMUNITY)
Admission: EM | Admit: 2018-05-06 | Discharge: 2018-05-06 | Disposition: A | Discharge status: Home / Self Care | Payer: BLUE CROSS/BLUE SHIELD | Attending: Emergency Medicine | Admitting: Emergency Medicine

## 2018-05-06 ENCOUNTER — Other Ambulatory Visit: Payer: Self-pay

## 2018-05-06 DIAGNOSIS — I10 Essential (primary) hypertension: Secondary | ICD-10-CM | POA: Diagnosis not present

## 2018-05-06 DIAGNOSIS — I951 Orthostatic hypotension: Secondary | ICD-10-CM

## 2018-05-06 DIAGNOSIS — R8290 Unspecified abnormal findings in urine: Secondary | ICD-10-CM

## 2018-05-06 DIAGNOSIS — E039 Hypothyroidism, unspecified: Secondary | ICD-10-CM | POA: Insufficient documentation

## 2018-05-06 DIAGNOSIS — Z79899 Other long term (current) drug therapy: Secondary | ICD-10-CM | POA: Insufficient documentation

## 2018-05-06 DIAGNOSIS — T887XXA Unspecified adverse effect of drug or medicament, initial encounter: Secondary | ICD-10-CM | POA: Diagnosis not present

## 2018-05-06 DIAGNOSIS — E1165 Type 2 diabetes mellitus with hyperglycemia: Secondary | ICD-10-CM | POA: Diagnosis not present

## 2018-05-06 DIAGNOSIS — Z85850 Personal history of malignant neoplasm of thyroid: Secondary | ICD-10-CM | POA: Diagnosis not present

## 2018-05-06 DIAGNOSIS — Z794 Long term (current) use of insulin: Secondary | ICD-10-CM | POA: Insufficient documentation

## 2018-05-06 DIAGNOSIS — R82998 Other abnormal findings in urine: Secondary | ICD-10-CM | POA: Diagnosis not present

## 2018-05-06 DIAGNOSIS — R739 Hyperglycemia, unspecified: Secondary | ICD-10-CM

## 2018-05-06 LAB — COMPREHENSIVE METABOLIC PANEL
ALBUMIN: 3.2 g/dL — AB (ref 3.5–5.0)
ALT: 24 U/L (ref 0–44)
ANION GAP: 10 (ref 5–15)
AST: 19 U/L (ref 15–41)
Alkaline Phosphatase: 70 U/L (ref 38–126)
BUN: 8 mg/dL (ref 6–20)
CO2: 24 mmol/L (ref 22–32)
Calcium: 9.4 mg/dL (ref 8.9–10.3)
Chloride: 100 mmol/L (ref 98–111)
Creatinine, Ser: 0.84 mg/dL (ref 0.44–1.00)
GFR calc Af Amer: >60 mL/min (ref >60)
GFR calc non Af Amer: >60 mL/min (ref >60)
GLUCOSE: 244 mg/dL — AB (ref 70–99)
POTASSIUM: 3.7 mmol/L (ref 3.5–5.1)
SODIUM: 134 mmol/L — AB (ref 135–145)
Total Bilirubin: 0.4 mg/dL (ref 0.3–1.2)
Total Protein: 7.3 g/dL (ref 6.5–8.1)

## 2018-05-06 LAB — CBG MONITORING, ED
Glucose-Capillary: 250 mg/dL — ABNORMAL HIGH (ref 70–99)
Glucose-Capillary: 177 mg/dL — ABNORMAL HIGH (ref 70–99)

## 2018-05-06 LAB — CBC WITH DIFFERENTIAL/PLATELET
ABS IMMATURE GRANULOCYTES: 0.07 10*3/uL (ref 0.00–0.07)
Basophils Absolute: 0.1 10*3/uL (ref 0.0–0.1)
Basophils Relative: 1 %
Eosinophils Absolute: 0.3 10*3/uL (ref 0.0–0.5)
Eosinophils Relative: 3 %
HEMATOCRIT: 39.1 % (ref 36.0–46.0)
HEMOGLOBIN: 12 g/dL (ref 12.0–15.0)
IMMATURE GRANULOCYTES: 1 %
LYMPHS ABS: 3 10*3/uL (ref 0.7–4.0)
LYMPHS PCT: 29 %
MCH: 25.4 pg — ABNORMAL LOW (ref 26.0–34.0)
MCHC: 30.7 g/dL (ref 30.0–36.0)
MCV: 82.7 fL (ref 80.0–100.0)
Monocytes Absolute: 0.5 10*3/uL (ref 0.1–1.0)
Monocytes Relative: 5 %
NEUTROS ABS: 6.4 10*3/uL (ref 1.7–7.7)
NEUTROS PCT: 61 %
NRBC: 0 % (ref 0.0–0.2)
Platelets: 434 10*3/uL — ABNORMAL HIGH (ref 150–400)
RBC: 4.73 MIL/uL (ref 3.87–5.11)
RDW: 14.7 % (ref 11.5–15.5)
WBC: 10.4 10*3/uL (ref 4.0–10.5)

## 2018-05-06 LAB — URINALYSIS, ROUTINE W REFLEX MICROSCOPIC
Bilirubin Urine: NEGATIVE
Glucose, UA: NEGATIVE mg/dL
Hgb urine dipstick: NEGATIVE
KETONES UR: NEGATIVE mg/dL
LEUKOCYTES UA: NEGATIVE
NITRITE: NEGATIVE
PH: 6 (ref 5.0–8.0)
Protein, ur: NEGATIVE mg/dL
Specific Gravity, Urine: 1.012 (ref 1.005–1.030)

## 2018-05-06 LAB — I-STAT CG4 LACTIC ACID, ED
LACTIC ACID, VENOUS: 1.27 mmol/L (ref 0.5–1.9)
Lactic Acid, Venous: 1.56 mmol/L (ref 0.5–1.9)

## 2018-05-06 LAB — RAPID URINE DRUG SCREEN, HOSP PERFORMED
Amphetamines: NOT DETECTED
BARBITURATES: NOT DETECTED
BENZODIAZEPINES: NOT DETECTED
COCAINE: NOT DETECTED
Opiates: NOT DETECTED
Tetrahydrocannabinol: NOT DETECTED

## 2018-05-06 LAB — LIPASE, BLOOD: Lipase: 24 U/L (ref 11–51)

## 2018-05-06 LAB — I-STAT TROPONIN, ED: Troponin i, poc: 0 ng/mL (ref 0.00–0.08)

## 2018-05-06 LAB — WET PREP, GENITAL
CLUE CELLS WET PREP: NONE SEEN
Sperm: NONE SEEN
Trich, Wet Prep: NONE SEEN
WBC WET PREP: NONE SEEN
YEAST WET PREP: NONE SEEN

## 2018-05-06 MED ORDER — SODIUM CHLORIDE 0.9 % IV BOLUS
1000.0000 mL | Freq: Once | INTRAVENOUS | Status: AC
  Administered 2018-05-06: 1000 mL via INTRAVENOUS

## 2018-05-06 NOTE — ED Provider Notes (Signed)
[facility] EMERGENCY DEPARTMENT Provider Note   CSN: [id number] Arrival date & time: 05/06/18  1235     History   Chief Complaint No chief complaint on file.   HPI Lynn Martinez is a 47 y.o. female with a past medical history of bipolar, depression, diabetes, hypothyroid disease status post thyroidectomy for thyroid cancer, hypertension, PCOS, PTSD, obesity, who presents today with her fianc for evaluation.  Patient has multiple complaints today.  1.  Urinary odor: Patient has had increasing urinary odor over the past week.  She also reports increased frequency, urgency.  It is to the point that her fianc reports that it is a pungent odor.  Patient denies any back pain.  No vaginal discharge or bleeding.  Patient is s/p hysterectomy.   2.  Hyperglycemia.  Patient reports that at 1015 when she checked her sugar it was high at 245.  She reports nausea, polyuria, polydipsia.  She is on 50 units of Lantus twice daily which she took this morning.  Her spouse confirms that she has been taking all of her medications as directed.  3.  Mental changes: Patient reports that when she gets up and moves she feels lightheaded like she is going to pass out.  Patient is bipolar, her spouse says that when she has bipolar symptoms it is normally aggression and she has not been aggressive recently.  He reports that she has had difficulty walking without falling over.  He reports that she is acting more spacey than usual.  Chart review shows that approximately 4 to 5 days ago patient switched from clonidine to losartan.  Patient reports that she started feeling spacey lightheaded with mental status changes around the time of that switch.    She denies chest pain, shortness of breath, abdominal pain nausea vomiting or diarrhea.  HPI  Past Medical History:  Diagnosis Date  . Anemia   . Anxiety   . Arthritis    "knees" (09/30/2015)  . Bipolar disorder ([place])   . Depression     . Diabetes mellitus without complication ([place])   . Fibroid    s/p myomectomy 2010, hysterectomy 2013  . Grave's disease   . Heart murmur   . Hormone disorder   . Hypertension   . Hypothyroidism   . 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 Migraine     otc med prn  . PCOS (polycystic ovarian syndrome)   . PMS (premenstrual syndrome)   . PTSD (post-traumatic stress disorder)   . Seasonal allergies   . Thyroid cancer ([place]) 2005  . Thyroid disease   . Type II diabetes mellitus ([place])   . Vertigo     Patient Active Problem List   Diagnosis Date Noted  . Pain in the chest 09/30/2015  . Hypertension 09/30/2015  . Insulin dependent diabetes mellitus ([place]) 09/30/2015  . Chest pain 09/30/2015  . Cholelithiasis with acute cholecystitis 09/05/2013  . Borderline behavior 03/14/2012    Class: Chronic  . OCD (obsessive compulsive disorder) 03/14/2012    Class: Chronic  . PTSD (post-traumatic stress disorder) 03/14/2012    Class: Chronic  . 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 Lumbago without sciatica 03/12/2012  . Major depression, recurrent ([place]) 03/08/2012    Class: Chronic  . Snoring 12/02/2011  . PCOS (polycystic ovarian syndrome) 09/05/2011  . Hypothyroidism 08/13/2010  . VITAMIN D DEFICIENCY 08/13/2010  . ANEMIA-NOS 08/13/2010  Past Surgical History:  Procedure Laterality Date  . ABDOMINAL HYSTERECTOMY  10/11/2011   Procedure: HYSTERECTOMY ABDOMINAL;  Surgeon: Terrance Mass, MD;  Location: [facility];  Service: Gynecology;  Laterality: N/A;  With Repair of serosa.  . ABDOMINAL HYSTERECTOMY    . CHOLECYSTECTOMY N/A 09/06/2013   Procedure: LAPAROSCOPIC CHOLECYSTECTOMY WITH INTRAOPERATIVE CHOLANGIOGRAM;  Surgeon: Merrie Roof, MD;  Location: [facility];  Service: General;  Laterality: N/A;  . DIAGNOSTIC LAPAROSCOPY    . DILATION AND CURETTAGE OF UTERUS    . HERNIA REPAIR    . MYOMECTOMY  2010   LAPAROSCOPY  .  thyroid removed    . TOTAL THYROIDECTOMY  2006  . UMBILICAL HERNIA REPAIR  1982     OB History    Gravida  2   Para  0   Term  0   Preterm  0   AB  2   Living        SAB  2   TAB  0   Ectopic  0   Multiple      Live Births               Home Medications    Prior to Admission medications   Medication Sig Start Date End Date Taking? Authorizing Provider  cholecalciferol (VITAMIN D) 1000 UNITS tablet Take 2,000 Units by mouth daily.  03/14/12   Darrol Jump, MD  diclofenac sodium (VOLTAREN) 1 % GEL Apply 2 g topically daily as needed.    [provider]  docusate sodium (COLACE) 100 MG capsule Take 2 capsules (200 mg total) by mouth every morning. For stool softner Patient taking differently: Take 100 mg by mouth at bedtime. For stool softner 03/14/12   Darrol Jump, MD  ferrous sulfate 325 (65 FE) MG tablet Take 1 tablet (325 mg total) by mouth daily with breakfast. For low iron Patient taking differently: Take 325 mg by mouth 2 (two) times daily with a meal. For low iron 03/14/12   Darrol Jump, MD  glipiZIDE (GLUCOTROL) 10 MG tablet Take 1 tablet (10 mg total) by mouth daily before breakfast. 03/15/18   Argentina Donovan, PA-C  HYDROcodone-acetaminophen (NORCO/VICODIN) 5-325 MG tablet Take 1 tablet by mouth every 4 (four) hours as needed. Patient not taking: Reported on 04/16/2018 02/21/18   Ward, Delice Bison, DO  hydrOXYzine (ATARAX/VISTARIL) 25 MG tablet Take 1 tablet (25 mg total) by mouth 2 (two) times daily. 04/13/18   Fulp, Cammie, MD  insulin aspart (NOVOLOG FLEXPEN) 100 UNIT/ML FlexPen Inject 10 Units into the skin daily before supper. 04/25/18   Fulp, Cammie, MD  Insulin Glargine (LANTUS SOLOSTAR) 100 UNIT/ML Solostar Pen Inject 50 Units into the skin 2 (two) times daily. 04/25/18   Fulp, Cammie, MD  Insulin Pen Needle (TRUEPLUS PEN NEEDLES) 32G X 4 MM MISC Use as instructed to inject insulin. 04/25/18   Fulp, Cammie, MD  levothyroxine (SYNTHROID,  LEVOTHROID) 125 MCG tablet Take 1 tablet (125 mcg total) by mouth daily before breakfast. 04/11/18   Fulp, Cammie, MD  lithium carbonate 300 MG capsule Take 600 mg by mouth at bedtime.     [provider]  losartan (COZAAR) 50 MG tablet Take 1 tablet (50 mg total) by mouth daily. 05/01/18   Fulp, Cammie, MD  lurasidone (LATUDA) 80 MG TABS tablet Take 80 mg by mouth at bedtime.    [provider]  ondansetron (ZOFRAN ODT) 4 MG disintegrating tablet Take 1 tablet (4 mg  total) by mouth every 6 (six) hours as needed for nausea or vomiting. Patient not taking: Reported on 04/16/2018 02/21/18   Ward, Delice Bison, DO  prazosin (MINIPRESS) 1 MG capsule Take 1 capsule (1 mg total) by mouth at bedtime and may repeat dose one time if needed. For sleep/nightmares 03/14/12   Darrol Jump, MD  propranolol (INDERAL) 40 MG tablet Take 1 tablet (40 mg total) by mouth 2 (two) times daily. 04/13/18   Fulp, Cammie, MD  sitaGLIPtin (JANUVIA) 100 MG tablet Take 1 tablet (100 mg total) by mouth daily. 04/11/18   Fulp, Cammie, MD  tolterodine (DETROL LA) 4 MG 24 hr capsule Take 1 capsule (4 mg total) by mouth daily. 04/13/18   Antony Blackbird, MD    Family History Family History  Problem Relation Age of Onset  . Thyroid disease Mother   . Diabetes Father   . Hypertension Father   . Heart disease Father   . Heart attack Father   . Cancer Maternal Aunt        BRAIN  . Cancer Maternal Grandmother        LYMPHOMA  . Hypertension Maternal Grandmother   . Cancer Paternal Grandmother        PANCREATIC  . Diabetes Paternal Grandmother   . Hypertension Paternal Grandmother   . Hypertension Maternal Grandfather   . Hypertension Paternal Grandfather     Social History Social History   Tobacco Use  . Smoking status: Never Smoker  . Smokeless tobacco: Never Used  Substance Use Topics  . Alcohol use: Never    Frequency: Never  . Drug use: Never     Allergies   Bee venom; Codeine; Ibuprofen;  Ibuprofen; Metformin and related; Morphine; Penicillins; and Aspirin   Review of Systems Review of Systems  Constitutional: Positive for appetite change and fatigue. Negative for chills and fever.  Eyes: Negative for visual disturbance.  Respiratory: Negative for chest tightness and shortness of breath.   Cardiovascular: Negative for chest pain.  Gastrointestinal: Negative for abdominal pain.  Endocrine: Positive for polydipsia and polyuria.  Genitourinary: Positive for dysuria, frequency and urgency.  Musculoskeletal: Positive for gait problem. Negative for neck pain.  Neurological: Positive for weakness and light-headedness. Negative for syncope and headaches.  Psychiatric/Behavioral: Positive for confusion.  All other systems reviewed and are negative.    Physical Exam Updated Vital Signs BP 124/74   Pulse (!) 54   Temp 98.3 F (36.8 C) (Oral)   Resp 16   LMP 09/20/2011   SpO2 99%   Physical Exam  Constitutional: She is oriented to person, place, and time. She appears well-developed and well-nourished. No distress.  HENT:  Head: Normocephalic and atraumatic.  Mouth/Throat: Oropharynx is clear and moist.  TM bilaterally pearly gray   Eyes: Pupils are equal, round, and reactive to light. Conjunctivae and EOM are normal.  Neck: Normal range of motion. Neck supple. No JVD present.  Cardiovascular: Normal rate, regular rhythm, normal heart sounds and intact distal pulses. Exam reveals no friction rub.  No murmur heard. Trace edema to bilateral lower extremities.   Pulmonary/Chest: Effort normal and breath sounds normal. No respiratory distress.  Abdominal: Soft. Bowel sounds are normal. She exhibits no distension. There is tenderness in the right upper quadrant and epigastric area. There is no guarding.  Genitourinary:  Genitourinary Comments: Exam performed with female EMT chaperone in room.  Normal external female genitalia.  No abnormal discharge in the vaginal canal.  No  bleeding.  No  adnexal tenderness to palpation.  Musculoskeletal: She exhibits no edema.  Lymphadenopathy:    She has no cervical adenopathy.  Neurological: She is alert and oriented to person, place, and time.  Mental Status:  Alert, oriented, thought content appropriate, able to give a coherent history. Speech fluent without evidence of aphasia. Able to follow 2 step commands without difficulty.  Cranial Nerves:  II:  Peripheral visual fields grossly normal, pupils equal, round, reactive to light III,IV, VI: ptosis not present, extra-ocular motions intact bilaterally  V,VII: smile symmetric, facial light touch sensation equal VIII: hearing grossly normal to voice  X: uvula elevates symmetrically  XI: bilateral shoulder shrug symmetric and strong XII: midline tongue extension without fassiculations Motor:  Normal tone. 5/5 in upper and lower extremities bilaterally including strong and equal grip strength and dorsiflexion/plantar flexion Cerebellar: normal finger-to-nose with bilateral upper extremities CV: distal pulses palpable throughout    Skin: Skin is warm and dry.  Psychiatric: She has a normal mood and affect.  Nursing note and vitals reviewed.    ED Treatments / Results  Labs (all labs ordered are listed, but only abnormal results are displayed) Labs Reviewed  COMPREHENSIVE METABOLIC PANEL - Abnormal; Notable for the following components:      Result Value   Sodium 134 (*)    Glucose, Bld 244 (*)    Albumin 3.2 (*)    All other components within normal limits  CBC WITH DIFFERENTIAL/PLATELET - Abnormal; Notable for the following components:   MCH 25.4 (*)    Platelets 434 (*)    All other components within normal limits  URINALYSIS, ROUTINE W REFLEX MICROSCOPIC - Abnormal; Notable for the following components:   APPearance CLOUDY (*)    All other components within normal limits  CBG MONITORING, ED - Abnormal; Notable for the following components:    Glucose-Capillary 250 (*)    All other components within normal limits  CBG MONITORING, ED - Abnormal; Notable for the following components:   Glucose-Capillary 177 (*)    All other components within normal limits  WET PREP, GENITAL  URINE CULTURE  LIPASE, BLOOD  RAPID URINE DRUG SCREEN, HOSP PERFORMED  I-STAT TROPONIN, ED  I-STAT CG4 LACTIC ACID, ED  I-STAT CG4 LACTIC ACID, ED  GC/CHLAMYDIA PROBE AMP ([place]) NOT AT [facility]    EKG EKG Interpretation  Date/Time:  Sunday May 06 2018 14:41:28 EST Ventricular Rate:  70 PR Interval:    QRS Duration: 91 QT Interval:  340 QTC Calculation: 367 R Axis:   31 Text Interpretation:  Sinus rhythm Probable left atrial enlargement Low voltage, precordial leads Borderline T abnormalities, anterior leads No significant change since Confirmed by Fredia Sorrow [phone]) on 05/06/2018 3:28:59 PM   Radiology No results found.  Procedures Procedures (including critical care time)  Medications Ordered in ED Medications  sodium chloride 0.9 % bolus 1,000 mL (0 mLs Intravenous Stopped 05/06/18 1709)     Initial Impression / Assessment and Plan / ED Course  I have reviewed the triage vital signs and the nursing notes.  Pertinent labs & imaging results that were available during my care of the patient were reviewed by me and considered in my medical decision making (see chart for details).  Clinical Course as of May 06 2150  Nancy Fetter May 06, 2018  1816 Patient reevaluated.  She is still slightly orthostatic, however feels much better.  She feels like she is back to her baseline, is no longer dizzy or lightheaded, has been able to  walk without difficulties and is requesting discharge home.  Discussed risks and benefits of pursuing admission and she continues to request discharge home.   [EH]    Clinical Course User Index [EH] Lorin Glass, PA-C   Patient presents today for evaluation of multiple complaints. 1.  Urinary odor  patient's urine was unremarkable with no blood, protein nitrite or leukocytes.  Urine culture was sent.  Given odor concerns pelvic exam was performed without abnormalities seen.  Wet prep was normal.  GC testing sent.  Unsure what is causing patient's reported odor, recommended increasing p.o. fluids. 2.Hyperglycemia: Patient sugar was elevated at 244.  Her urine does not have any ketones.  She was treated in the emergency room with 1 L of fluids as she took her insulin prior to arrival.  This decreased her blood sugar to 177.  No evidence of DKA. 3.  Mental status change: Patient reported generally feeling spacey.  This improved after she was given 1 L IV fluids, and she felt like her usual baseline at the time of discharge.  Patient was neurologically intact on my exam.  She was never lethargic or confused. Mental status improvement as reported by patient and her family.  4.  Orthostatic hypotension: Patient recently had blood pressure medications which.  While in the department initially she was borderline hypotensive with pressures as low as 103/65.  Orthostatic vitals were obtained and patient was orthostatic.  She was given 1 L of IV fluids after which they were rechecked.  She was less orthostatic however she felt significantly better.  She was able to ambulate to the bathroom multiple times without evidence of ataxia or other difficulties.  Given that she remained orthostatic, despite being less orthostatic, I did discuss with her and her spouse the option of admission, which they adamantly declined.  I sent a note in epic to patient's pharmacist at [facility] as they were involved in this recent blood pressure medication change.  Patient is instructed to call PCP tomorrow morning for an appointment.  Return precautions were discussed with patient who states their understanding.  At the time of discharge patient denied any unaddressed complaints or concerns.  Patient is  agreeable for discharge home.  Final Clinical Impressions(s) / ED Diagnoses   Final diagnoses:  Orthostatic hypotension  Medication side effect  Foul smelling urine  Hyperglycemia    ED Discharge Orders    None       Ollen Gross 05/06/18 2152    Fredia Sorrow, MD 05/07/18 [phone]

## 2018-05-06 NOTE — ED Triage Notes (Signed)
Patient arrived to the ED from home reports that her blood sugar is high(245 at 10:15am.). She reports that she having nausea, light-headed, "temperament not right" and believes it is related to her blood sugar.  She takes both oral and injectable medications for her diabetes. She states that she took 50units of Lantus this morning. She also reports that her urine smell is "off." PT's spouse is at bedside, he reports that patient is bipolar and he "wonder if all of this is about the medication imbalance."  Patient is A&O X 4, CBG on arrival is 250. Spouse at bedisde

## 2018-05-06 NOTE — ED Notes (Signed)
Patient ambulated to the bathroom twice without incident

## 2018-05-06 NOTE — ED Notes (Signed)
D/c reviewed with patient and spouse 

## 2018-05-06 NOTE — Discharge Instructions (Addendum)
I am concerned that your symptoms today may have been caused by your blood pressure medicine bringing your blood pressure too low.  Please call your doctor in the morning, and do not take that blood pressure medicine without consulting them.  Please make sure that you are drinking plenty of fluid and staying well-hydrated.  Please make sure you are eating a healthy well-balanced diet.  Your urine has been sent for culture today, and if it grows out bacteria you will be contacted.  If you develop fevers worsening symptoms or have additional concerns please seek additional medical care and evaluation.

## 2018-05-06 NOTE — ED Provider Notes (Signed)
Medical screening examination/treatment/procedure(s) were conducted as a shared visit with non-physician practitioner(s) and myself.  I personally evaluated the patient during the encounter.  EKG Interpretation  Date/Time:  Sunday May 06 2018 14:41:28 EST Ventricular Rate:  70 PR Interval:    QRS Duration: 91 QT Interval:  340 QTC Calculation: 367 R Axis:   31 Text Interpretation:  Sinus rhythm Probable left atrial enlargement Low voltage, precordial leads Borderline T abnormalities, anterior leads No significant change since Confirmed by Fredia Sorrow [phone]) on 05/06/2018 3:28:59 PM   Patient seen by me along with physician assistant.  Patient is coming from home.  Patient with lots of complaints to include having nausea lightheaded temperature not right believes it is related to her blood sugar.  Blood sugar was high at home at 245.  She takes both oral and injectable medications for diabetes.  States she took 50 units of Lantus this morning.  She also reporting that her urine smells off.  Patient has a history of bipolar.  Patient here with orthostatic hypotension.  Patient given some IV fluids.  If it does not correct itself patient will require admission.  Patient's urinalysis without any significant abnormalities.  Pelvic exam will be done by the physician assistant.  If now significant findings are revealed on pelvic exam.  And the orthostatic hypotension corrects itself here with fluids patient is a candidate for discharge home.  If your static hypertension does not correct still patient will probably require admission.      Fredia Sorrow, MD 05/06/18 [phone]

## 2018-05-07 ENCOUNTER — Telehealth: Payer: Self-pay | Admitting: Pharmacist

## 2018-05-07 LAB — GC/CHLAMYDIA PROBE AMP (~~LOC~~) NOT AT ARMC
Chlamydia: NEGATIVE
Neisseria Gonorrhea: NEGATIVE

## 2018-05-07 LAB — URINE CULTURE

## 2018-05-07 NOTE — Telephone Encounter (Signed)
Received call from patient concerning recent ED presentation with orthostatic hypotension. Advised pt to continue to hold BP medication. She is to see me Wednesday for a BP re-check.

## 2018-05-08 ENCOUNTER — Other Ambulatory Visit: Payer: Self-pay

## 2018-05-08 ENCOUNTER — Encounter (HOSPITAL_COMMUNITY): Payer: Self-pay

## 2018-05-08 ENCOUNTER — Emergency Department (HOSPITAL_COMMUNITY)
Admission: EM | Admit: 2018-05-08 | Discharge: 2018-05-08 | Disposition: A | Discharge status: Home / Self Care | Payer: BLUE CROSS/BLUE SHIELD | Attending: Emergency Medicine | Admitting: Emergency Medicine

## 2018-05-08 DIAGNOSIS — Z794 Long term (current) use of insulin: Secondary | ICD-10-CM | POA: Insufficient documentation

## 2018-05-08 DIAGNOSIS — E039 Hypothyroidism, unspecified: Secondary | ICD-10-CM | POA: Diagnosis not present

## 2018-05-08 DIAGNOSIS — E860 Dehydration: Secondary | ICD-10-CM | POA: Insufficient documentation

## 2018-05-08 DIAGNOSIS — E119 Type 2 diabetes mellitus without complications: Secondary | ICD-10-CM | POA: Diagnosis not present

## 2018-05-08 DIAGNOSIS — I10 Essential (primary) hypertension: Secondary | ICD-10-CM | POA: Diagnosis not present

## 2018-05-08 LAB — URINALYSIS, ROUTINE W REFLEX MICROSCOPIC
BILIRUBIN URINE: NEGATIVE
Glucose, UA: >=500 mg/dL — AB
HGB URINE DIPSTICK: NEGATIVE
KETONES UR: NEGATIVE mg/dL
LEUKOCYTES UA: NEGATIVE
NITRITE: NEGATIVE
PH: 5 (ref 5.0–8.0)
Protein, ur: NEGATIVE mg/dL
SPECIFIC GRAVITY, URINE: 1.02 (ref 1.005–1.030)

## 2018-05-08 LAB — BASIC METABOLIC PANEL
ANION GAP: 7 (ref 5–15)
BUN: 7 mg/dL (ref 6–20)
CALCIUM: 9 mg/dL (ref 8.9–10.3)
CO2: 25 mmol/L (ref 22–32)
Chloride: 103 mmol/L (ref 98–111)
Creatinine, Ser: 0.99 mg/dL (ref 0.44–1.00)
GLUCOSE: 323 mg/dL — AB (ref 70–99)
POTASSIUM: 3.9 mmol/L (ref 3.5–5.1)
Sodium: 135 mmol/L (ref 135–145)

## 2018-05-08 LAB — CBG MONITORING, ED: GLUCOSE-CAPILLARY: 286 mg/dL — AB (ref 70–99)

## 2018-05-08 NOTE — ED Triage Notes (Signed)
Pt states she was seen here last week for low blood sugar and low blood pressure. Today she reports high blood sugar and blood pressure. She reports feeling weak and headache. Pt alert and oriented. No distress noted.

## 2018-05-08 NOTE — ED Provider Notes (Signed)
[facility] EMERGENCY DEPARTMENT Provider Note   CSN: [id number] Arrival date & time: 05/08/18  1025     History   Chief Complaint Chief Complaint  Patient presents with  . Hypertension  . Hyperglycemia    HPI Lynn Martinez is a 47 y.o. female.  HPI  The pt is a 48 y/o female - she has hx of HTN and DM - has had chronic treatment for this - recently changed from clonidine to losartan and has been tolerating this well until it was recently found at her last visit 2 days ago that she was having some orthostatic hypotension at which time it was recommended that she discontinue her losartan.  She has not taken it since.  She has also been noticing some elevated blood sugar.  She denies dysuria but complains of a strong urinary order but otherwise has been eating and drinking well, states she does not feel dehydrated, has no nausea vomiting fevers chills coughing shortness of breath chest pain abdominal pain back pain or rashes.  She is concerned because she cannot see her doctor until tomorrow so wanted to have a recheck today.  She was measuring blood pressures at home that were elevated and thus concerned her.  Past Medical History:  Diagnosis Date  . Anemia   . Anxiety   . Arthritis    "knees" (09/30/2015)  . Bipolar disorder ([place])   . Depression   . Diabetes mellitus without complication ([place])   . Fibroid    s/p myomectomy 2010, hysterectomy 2013  . Grave's disease   . Heart murmur   . Hormone disorder   . Hypertension   . Hypothyroidism   . 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 Migraine     otc med prn  . PCOS (polycystic ovarian syndrome)   . PMS (premenstrual syndrome)   . PTSD (post-traumatic stress disorder)   . Seasonal allergies   . Thyroid cancer ([place]) 2005  . Thyroid disease   . Type II diabetes mellitus ([place])   . Vertigo     Patient Active Problem List   Diagnosis Date Noted  . Pain in the chest  09/30/2015  . Hypertension 09/30/2015  . Insulin dependent diabetes mellitus ([place]) 09/30/2015  . Chest pain 09/30/2015  . Cholelithiasis with acute cholecystitis 09/05/2013  . Borderline behavior 03/14/2012    Class: Chronic  . OCD (obsessive compulsive disorder) 03/14/2012    Class: Chronic  . PTSD (post-traumatic stress disorder) 03/14/2012    Class: Chronic  . 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 Lumbago without sciatica 03/12/2012  . Major depression, recurrent ([place]) 03/08/2012    Class: Chronic  . Snoring 12/02/2011  . PCOS (polycystic ovarian syndrome) 09/05/2011  . Hypothyroidism 08/13/2010  . VITAMIN D DEFICIENCY 08/13/2010  . ANEMIA-NOS 08/13/2010    Past Surgical History:  Procedure Laterality Date  . ABDOMINAL HYSTERECTOMY  10/11/2011   Procedure: HYSTERECTOMY ABDOMINAL;  Surgeon: Terrance Mass, MD;  Location: [facility];  Service: Gynecology;  Laterality: N/A;  With Repair of serosa.  . ABDOMINAL HYSTERECTOMY    . CHOLECYSTECTOMY N/A 09/06/2013   Procedure: LAPAROSCOPIC CHOLECYSTECTOMY WITH INTRAOPERATIVE CHOLANGIOGRAM;  Surgeon: Merrie Roof, MD;  Location: [facility];  Service: General;  Laterality: N/A;  . DIAGNOSTIC LAPAROSCOPY    . DILATION AND CURETTAGE OF UTERUS    . HERNIA REPAIR    . MYOMECTOMY  2010  LAPAROSCOPY  . thyroid removed    . TOTAL THYROIDECTOMY  2006  . UMBILICAL HERNIA REPAIR  1982     OB History    Gravida  2   Para  0   Term  0   Preterm  0   AB  2   Living        SAB  2   TAB  0   Ectopic  0   Multiple      Live Births               Home Medications    Prior to Admission medications   Medication Sig Start Date End Date Taking? Authorizing Provider  cholecalciferol (VITAMIN D) 1000 UNITS tablet Take 2,000 Units by mouth daily.  03/14/12   Darrol Jump, MD  diclofenac sodium (VOLTAREN) 1 % GEL Apply 2 g topically daily as needed.    [provider]  docusate sodium  (COLACE) 100 MG capsule Take 2 capsules (200 mg total) by mouth every morning. For stool softner Patient taking differently: Take 100 mg by mouth at bedtime. For stool softner 03/14/12   Darrol Jump, MD  ferrous sulfate 325 (65 FE) MG tablet Take 1 tablet (325 mg total) by mouth daily with breakfast. For low iron Patient taking differently: Take 325 mg by mouth 2 (two) times daily with a meal. For low iron 03/14/12   Darrol Jump, MD  glipiZIDE (GLUCOTROL) 10 MG tablet Take 1 tablet (10 mg total) by mouth daily before breakfast. 03/15/18   Argentina Donovan, PA-C  HYDROcodone-acetaminophen (NORCO/VICODIN) 5-325 MG tablet Take 1 tablet by mouth every 4 (four) hours as needed. Patient not taking: Reported on 04/16/2018 02/21/18   Ward, Delice Bison, DO  hydrOXYzine (ATARAX/VISTARIL) 25 MG tablet Take 1 tablet (25 mg total) by mouth 2 (two) times daily. 04/13/18   Fulp, Cammie, MD  insulin aspart (NOVOLOG FLEXPEN) 100 UNIT/ML FlexPen Inject 10 Units into the skin daily before supper. 04/25/18   Fulp, Cammie, MD  Insulin Glargine (LANTUS SOLOSTAR) 100 UNIT/ML Solostar Pen Inject 50 Units into the skin 2 (two) times daily. 04/25/18   Fulp, Cammie, MD  Insulin Pen Needle (TRUEPLUS PEN NEEDLES) 32G X 4 MM MISC Use as instructed to inject insulin. 04/25/18   Fulp, Cammie, MD  levothyroxine (SYNTHROID, LEVOTHROID) 125 MCG tablet Take 1 tablet (125 mcg total) by mouth daily before breakfast. 04/11/18   Fulp, Cammie, MD  lithium carbonate 300 MG capsule Take 600 mg by mouth at bedtime.     [provider]  losartan (COZAAR) 50 MG tablet Take 1 tablet (50 mg total) by mouth daily. 05/01/18   Fulp, Cammie, MD  lurasidone (LATUDA) 80 MG TABS tablet Take 80 mg by mouth at bedtime.    [provider]  ondansetron (ZOFRAN ODT) 4 MG disintegrating tablet Take 1 tablet (4 mg total) by mouth every 6 (six) hours as needed for nausea or vomiting. Patient not taking: Reported on 04/16/2018 02/21/18   Ward,  Delice Bison, DO  prazosin (MINIPRESS) 1 MG capsule Take 1 capsule (1 mg total) by mouth at bedtime and may repeat dose one time if needed. For sleep/nightmares 03/14/12   Darrol Jump, MD  propranolol (INDERAL) 40 MG tablet Take 1 tablet (40 mg total) by mouth 2 (two) times daily. 04/13/18   Fulp, Cammie, MD  sitaGLIPtin (JANUVIA) 100 MG tablet Take 1 tablet (100 mg total) by mouth daily. 04/11/18   Fulp, Cammie,  MD  tolterodine (DETROL LA) 4 MG 24 hr capsule Take 1 capsule (4 mg total) by mouth daily. 04/13/18   Antony Blackbird, MD    Family History Family History  Problem Relation Age of Onset  . Thyroid disease Mother   . Diabetes Father   . Hypertension Father   . Heart disease Father   . Heart attack Father   . Cancer Maternal Aunt        BRAIN  . Cancer Maternal Grandmother        LYMPHOMA  . Hypertension Maternal Grandmother   . Cancer Paternal Grandmother        PANCREATIC  . Diabetes Paternal Grandmother   . Hypertension Paternal Grandmother   . Hypertension Maternal Grandfather   . Hypertension Paternal Grandfather     Social History Social History   Tobacco Use  . Smoking status: Never Smoker  . Smokeless tobacco: Never Used  Substance Use Topics  . Alcohol use: Never    Frequency: Never  . Drug use: Never     Allergies   Bee venom; Codeine; Ibuprofen; Ibuprofen; Metformin and related; Morphine; Penicillins; and Aspirin   Review of Systems Review of Systems  All other systems reviewed and are negative.    Physical Exam Updated Vital Signs BP 132/78   Pulse 64   Temp 98.3 F (36.8 C) (Oral)   Resp 17   Ht 1.778 m (5\' 10" )   Wt 136.1 kg   LMP 09/20/2011   SpO2 100%   BMI 43.05 kg/m   Physical Exam  Constitutional: She appears well-developed and well-nourished. No distress.  HENT:  Head: Normocephalic and atraumatic.  Mouth/Throat: Oropharynx is clear and moist. No oropharyngeal exudate.  Eyes: Pupils are equal, round, and reactive to light.  Conjunctivae and EOM are normal. Right eye exhibits no discharge. Left eye exhibits no discharge. No scleral icterus.  Neck: Normal range of motion. Neck supple. No JVD present. No thyromegaly present.  Cardiovascular: Normal rate, regular rhythm, normal heart sounds and intact distal pulses. Exam reveals no gallop and no friction rub.  No murmur heard. Pulmonary/Chest: Effort normal and breath sounds normal. No respiratory distress. She has no wheezes. She has no rales.  Abdominal: Soft. Bowel sounds are normal. She exhibits no distension and no mass. There is no tenderness.  Musculoskeletal: Normal range of motion. She exhibits no edema or tenderness.  Lymphadenopathy:    She has no cervical adenopathy.  Neurological: She is alert. Coordination normal.  Skin: Skin is warm and dry. No rash noted. No erythema.  Psychiatric: She has a normal mood and affect. Her behavior is normal.  Nursing note and vitals reviewed.    ED Treatments / Results  Labs (all labs ordered are listed, but only abnormal results are displayed) Labs Reviewed  BASIC METABOLIC PANEL - Abnormal; Notable for the following components:      Result Value   Glucose, Bld 323 (*)    All other components within normal limits  URINALYSIS, ROUTINE W REFLEX MICROSCOPIC - Abnormal; Notable for the following components:   APPearance HAZY (*)    Glucose, UA >=500 (*)    Bacteria, UA RARE (*)    All other components within normal limits  CBG MONITORING, ED - Abnormal; Notable for the following components:   Glucose-Capillary 286 (*)    All other components within normal limits    EKG EKG Interpretation  Date/Time:  Tuesday May 08 2018 10:53:32 EST Ventricular Rate:  66 PR Interval:  180 QRS Duration: 84 QT Interval:  442 QTC Calculation: 463 R Axis:   66 Text Interpretation:  Normal sinus rhythm Cannot rule out Anterior infarct , age undetermined Abnormal ECG since last tracing no significant change Confirmed by  Noemi Chapel [phone]) on 05/08/2018 11:47:34 AM   Radiology No results found.  Procedures Procedures (including critical care time)  Medications Ordered in ED Medications - No data to display   Initial Impression / Assessment and Plan / ED Course  I have reviewed the triage vital signs and the nursing notes.  Pertinent labs & imaging results that were available during my care of the patient were reviewed by me and considered in my medical decision making (see chart for details).     Well appearing - likely dehydration UA and Culture seen and not remarkable Check labs / K / renal, Pt otherwise still having some positional light headedness.  Pt agreeable to the plan.  Urinalysis shows specific gravity of 1.02, no ketones or protein, glucose is present not surprising since her blood sugar is 286.  Otherwise the metabolic panel is unremarkable, the patient was given reassurance, encouraged home hydration, stable for discharge  We will also cut the dose of losartan in half to 25 mg daily, patient agreeable  Final Clinical Impressions(s) / ED Diagnoses   Final diagnoses:  Essential hypertension  Dehydration    ED Discharge Orders    None       Noemi Chapel, MD 05/08/18 1503

## 2018-05-08 NOTE — Discharge Instructions (Signed)
Your testing here has been unremarkable other than appearing to be somewhat dehydrated and having a slightly elevated blood sugar.  At this time I would strongly recommend drinking more fluids today, lots of water or Gatorade, please take half of the tablet of your losartan blood pressure medication instead of a full tablet and follow-up with your doctor in the clinic tomorrow.

## 2018-05-09 ENCOUNTER — Encounter: Payer: Self-pay | Admitting: Pharmacist

## 2018-05-09 ENCOUNTER — Ambulatory Visit: Payer: BLUE CROSS/BLUE SHIELD | Attending: Family Medicine | Admitting: Pharmacist

## 2018-05-09 VITALS — BP 111/73 | HR 67

## 2018-05-09 DIAGNOSIS — E119 Type 2 diabetes mellitus without complications: Secondary | ICD-10-CM | POA: Diagnosis not present

## 2018-05-09 DIAGNOSIS — Z8249 Family history of ischemic heart disease and other diseases of the circulatory system: Secondary | ICD-10-CM | POA: Diagnosis not present

## 2018-05-09 DIAGNOSIS — Z833 Family history of diabetes mellitus: Secondary | ICD-10-CM | POA: Insufficient documentation

## 2018-05-09 DIAGNOSIS — I10 Essential (primary) hypertension: Secondary | ICD-10-CM | POA: Diagnosis not present

## 2018-05-09 DIAGNOSIS — IMO0002 Reserved for concepts with insufficient information to code with codable children: Secondary | ICD-10-CM

## 2018-05-09 DIAGNOSIS — E118 Type 2 diabetes mellitus with unspecified complications: Secondary | ICD-10-CM

## 2018-05-09 DIAGNOSIS — E1165 Type 2 diabetes mellitus with hyperglycemia: Secondary | ICD-10-CM

## 2018-05-09 MED ORDER — INSULIN GLARGINE 100 UNIT/ML SOLOSTAR PEN: 55.0000 [IU] | PEN_INJECTOR | Freq: Two times a day (BID) | SUBCUTANEOUS | 11 refills | Status: DC

## 2018-05-09 MED ORDER — ATORVASTATIN CALCIUM 10 MG PO TABS: 10.0000 mg | ORAL_TABLET | Freq: Every day | ORAL | 0 refills | Status: DC

## 2018-05-09 MED ORDER — INSULIN ASPART 100 UNIT/ML FLEXPEN: 10.0000 [IU] | PEN_INJECTOR | Freq: Two times a day (BID) | SUBCUTANEOUS | 2 refills | Status: DC

## 2018-05-09 NOTE — Progress Notes (Signed)
    S:    PCP: Fulp No chief complaint on file.  Patient arrives in good spirits. Presents for HTN management at the request of Dr. Chapman Fitch. I last saw her on 04/25/18. We stopped clonidine and started losartan. Additionally, we added Novolog and increased Lantus to achieve better glycemic control. Since then, pt was seen in the ED on 11/24 and 11/27 with hypotension.   Today, pt reports no changes in diet or exercise. She denies hypoglycemic events but endorses continued dizziness with small improvement since yesterday. She is compliant with her DM medications but is holding HTN medications. She continues to have polyuria, polydipsia. Denies visual changes, neuropathy. Is performing foot exams.    Family/Social History: DM, HTN and MI (father), never smoker, denies alcohol  Insurance coverage/medication affordability: BCBS  O:  L arm after 5 minutes rest: 111/73, HR 67  Home fasting CBG: 200-300s   Lab Results  Component Value Date   HGBA1C 11.1 (A) 03/15/2018   HGBA1C 11.1 03/15/2018   HGBA1C 11.1 (A) 03/15/2018   HGBA1C 11.1 (A) 03/15/2018   Vitals:   05/09/18 1043  BP: 111/73  Pulse: 67   Lipid Panel     Component Value Date/Time   CHOL 130 04/11/2018 1224   TRIG 120 04/11/2018 1224   HDL 40 04/11/2018 1224   CHOLHDL 3.3 04/11/2018 1224   CHOLHDL 3.4 09/30/2015 0611   VLDL 22 09/30/2015 0611   LDLCALC 66 04/11/2018 1224   Clinical ASCVD: No  The 10-year ASCVD risk score Mikey Bussing DC Jr., et al., 2013) is: 3.4%   Values used to calculate the score:     Age: 68 years     Sex: Female     Is Non-Hispanic African American: Yes     Diabetic: Yes     Tobacco smoker: No     Systolic Blood Pressure: 604 mmHg     Is BP treated: Yes     HDL Cholesterol: 40 mg/dL     Total Cholesterol: 130 mg/dL    A/P: Diabetes longstanding currently uncontrolled. Patient is able to verbalize appropriate hypoglycemia management plan. Patient is adherent with medication. Control is  suboptimal due to dietary indiscretion and sedentary lifestyle. She is continuing to have elevated sugars. Will stop glipizide and add 10 units of Novolog before breakfast in addition to dinner. She seldomly eats lunch. Will increase Lantus to 55 units BID.  -Increased dose of Lantus to 55 units BID. -Increased dose of Novolog to 10 units BID before breakfast and dinner.  -Continued Januvia 100 mg daily  -Stopped glipizide -Extensively discussed pathophysiology of DM, recommended lifestyle interventions, dietary effects on glycemic control -Counseled on s/sx of and management of hypoglycemia -Next A1C anticipated 06/2018.   ASCVD risk - primary prevention in patient with DM. Last LDL is controlled. ASCVD risk score is not >20% - Moderate intensity statin indicated. Pt is agreeable to atorvastatin 10 mg daily. -Started atorvastatin 10 mg.   HTN - Pt with normal BP while holding medications. She is still having symptoms of hypotension. Will continue to hold and defer to PCP for follow-up.  -Continue holding BP medications  Written patient instructions provided.  Total time in face to face counseling 30 minutes.   Follow up PCP Clinic Visit 05/23/18.     Benard Halsted, PharmD, [place] [phone]

## 2018-05-09 NOTE — Patient Instructions (Signed)
Thank you for coming to see me today. Please do the following:  1. Increase Lantus to 55 units 2 times a day. 2. Increase Novolog to 10 units 2 times a day before breakfast and dinner 3. Continue Januvia 4. Stop glipizide. 5. Continue holding you blood pressure medication. Do not take until told to.  6. Continue checking blood sugars at home.  7. Continue making the lifestyle changes we've discussed together during our visit. Diet and exercise play a significant role in improving your blood sugars.  8. Follow-up with Dr. Chapman Fitch in 05/23/18.    Hypoglycemia or low blood sugar:   Low blood sugar can happen quickly and may become an emergency if not treated right away.   While this shouldn't happen often, it can be brought upon if you skip a meal or do not eat enough. Also, if your insulin or other diabetes medications are dosed too high, this can cause your blood sugar to go to low.   Warning signs of low blood sugar include: 1. Feeling shaky or dizzy 2. Feeling weak or tired  3. Excessive hunger 4. Feeling anxious or upset  5. Sweating even when you aren't exercising  What to do if I experience low blood sugar? 1. Check your blood sugar with your meter. If lower than 70, proceed to step 2.  2. Treat with 3-4 glucose tablets or 3 packets of regular sugar. If these aren't around, you can try hard candy. Yet another option would be to drink 4 ounces of fruit juice or 6 ounces of REGULAR soda.  3. Re-check your sugar in 15 minutes. If it is still below 70, do what you did in step 2 again. If has come back up, go ahead and eat a snack or small meal at this time.

## 2018-05-16 DIAGNOSIS — F3132 Bipolar disorder, current episode depressed, moderate: Secondary | ICD-10-CM | POA: Diagnosis not present

## 2018-05-23 ENCOUNTER — Ambulatory Visit: Payer: BLUE CROSS/BLUE SHIELD | Attending: Family Medicine | Admitting: Family Medicine

## 2018-05-23 VITALS — BP 108/72 | HR 76 | Temp 98.3°F | Resp 18 | Ht 70.0 in | Wt 288.0 lb

## 2018-05-23 DIAGNOSIS — E890 Postprocedural hypothyroidism: Secondary | ICD-10-CM | POA: Diagnosis not present

## 2018-05-23 DIAGNOSIS — Z794 Long term (current) use of insulin: Secondary | ICD-10-CM

## 2018-05-23 DIAGNOSIS — E119 Type 2 diabetes mellitus without complications: Secondary | ICD-10-CM | POA: Diagnosis not present

## 2018-05-23 DIAGNOSIS — N3946 Mixed incontinence: Secondary | ICD-10-CM

## 2018-05-23 DIAGNOSIS — I10 Essential (primary) hypertension: Secondary | ICD-10-CM | POA: Diagnosis not present

## 2018-05-23 DIAGNOSIS — IMO0001 Reserved for inherently not codable concepts without codable children: Secondary | ICD-10-CM

## 2018-05-23 LAB — POCT URINALYSIS DIP (CLINITEK)
Bilirubin, UA: NEGATIVE
Blood, UA: NEGATIVE
Glucose, UA: 500 mg/dL — AB
Ketones, POC UA: NEGATIVE mg/dL
Leukocytes, UA: NEGATIVE
Nitrite, UA: NEGATIVE
POC PROTEIN,UA: NEGATIVE
Spec Grav, UA: 1.01
Urobilinogen, UA: 0.2 U/dL
pH, UA: 6

## 2018-05-23 LAB — GLUCOSE, POCT (MANUAL RESULT ENTRY): POC Glucose: 402 mg/dL — AB (ref 70–99)

## 2018-05-23 NOTE — Progress Notes (Signed)
Subjective:    Patient ID: Lynn Martinez, female    DOB: 04-Jun-1971, 47 y.o.   MRN: [id number]  HPI       47 yo female seen in follow-up of poorly controlled insulin dependent Type 2 diabetes, hypertension and hypothyroidism.  Patient was seen on 05/09/2018 by the clinical pharmacist in follow-up of hypertension.  Patient had also been seen on 04/25/2018 at which time her clonidine was stopped and she was started on losartan and NovoLog was added and her Lantus was increased to achieve better control of her blood sugars patient however had to be seen in the emergency room on 05/06/2018 due to not feeling well and patient was found to have orthostatic hypotension for which she was treated with fluids.  Patient with complaint at the emergency department of having elevated blood sugar to 250 at home and blood pressure was also 250 at the emergency department and patient with complaint of feeling lightheaded and dizzy as well as having an increased odor to her urine.  Urine at that time was unremarkable.  Patient was given 1 L of fluids in the emergency department which decreased her blood sugar to 177 as patient had taken insulin before coming to the emergency department.  Blood pressure in the ED was as low as 103/65 but improved after fluids.  Patient held her blood pressure medication and called the clinical pharmacist on 05/07/2018 but ended up returning to the emergency department on 05/08/2018 because she was afraid that her blood pressure might be elevated since she had been holding her blood pressure medicines.  Patient's blood pressure per ED records was 132/78.  Patient had glucose of 323 and patient had repeat urinalysis which did not show ketones but did have the presence of glucose as well as rare bacteria.  Patient's dose of losartan was decreased to 25 mg daily.         At today's visit, patient reports that she is now feeling better.  Patient states that she had to miss several days  of work due to her blood pressure and blood sugars.  Patient states that she has been feeling overwhelmed.  Patient also has a history of anxiety, posttraumatic stress disorder and bipolar disorder and patient states that her mental health provider suggested that patient take time off from work.  Patient states that she she has been out of work since December 1 and will return to work sometime in January.  Patient states that because of the high blood sugars as well as lightheadedness and dizziness from dehydration and low blood pressure, she felt as if she could not concentrate at work to perform her job duties.  Patient also states that her medications are being adjusted by her mental health provider which is also caused her to have some issues with concentration.  Patient also states that when she has been sitting still for long time at work she will sometimes feel as if her legs are going numb.  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  Patient states that her therapist has suggested that she take at least 6 weeks or more off from work.  Patient states that she will bring in FMLA paperwork to be completed.      Patient reports that she is doing well on her current dose  of blood pressure medication.  Patient states that her blood sugars have continued to run a little high, generally in the 200s during the day.  Patient states that due to her work schedule, she is not able to take pre-meal insulin.  Patient reports that she was in a hurry to get here today and had fast food prior to her appointment and her blood sugar was very elevated.  Patient's blood sugar that was checked here in the office was elevated at 402.  Patient has had increased thirst, increased hunger and urinary frequency.  Patient states that she has started wearing  incontinence briefs to work because she will have some leakage if she has a sudden urge to urinate and is unable to get to the restroom in time.  Patient states that due to job stress and changes in her psychiatric medications that she has not been following a very healthy diet.  Patient feels that this is another reason why she needs to be out of work in order to get her blood sugars better controlled.      Patient reports that she is taking her thyroid medication daily.  Patient has some fatigue but she thinks that this is more so related to her blood sugars, recent dehydration and other changes in medications.  Patient does not feel as if she is having any significant peripheral edema at this time.  Patient denies constipation.  Patient does feel as if she has difficulty losing weight. Past Medical History:  Diagnosis Date  . Anemia   . Anxiety   . Arthritis    "knees" (09/30/2015)  . Bipolar disorder ([place])   . Depression   . Diabetes mellitus without complication ([place])   . Fibroid    s/p myomectomy 2010, hysterectomy 2013  . Grave's disease   . Heart murmur   . Hormone disorder   . Hypertension   . Hypothyroidism   . 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 Migraine     otc med prn  . PCOS (polycystic ovarian syndrome)   . PMS (premenstrual syndrome)   . PTSD (post-traumatic stress disorder)   . Seasonal allergies   . Thyroid cancer ([place]) 2005  . Thyroid disease   . Type II diabetes mellitus ([place])   . Vertigo    Past Surgical History:  Procedure Laterality Date  . ABDOMINAL HYSTERECTOMY  10/11/2011   Procedure: HYSTERECTOMY ABDOMINAL;  Surgeon: Terrance Mass, MD;  Location: [facility];  Service: Gynecology;  Laterality: N/A;  With Repair of serosa.  . ABDOMINAL HYSTERECTOMY    . CHOLECYSTECTOMY N/A 09/06/2013   Procedure: LAPAROSCOPIC CHOLECYSTECTOMY WITH INTRAOPERATIVE CHOLANGIOGRAM;  Surgeon: Merrie Roof, MD;  Location: [facility];  Service: General;   Laterality: N/A;  . DIAGNOSTIC LAPAROSCOPY    . DILATION AND CURETTAGE OF UTERUS    . HERNIA REPAIR    . MYOMECTOMY  2010   LAPAROSCOPY  . thyroid removed    . TOTAL THYROIDECTOMY  2006  . UMBILICAL HERNIA REPAIR  1982   Family History  Problem Relation Age of Onset  . Thyroid disease Mother   . Diabetes Father   . Hypertension Father   . Heart disease Father   . Heart attack Father   . Cancer Maternal Aunt        BRAIN  . Cancer Maternal Grandmother        LYMPHOMA  . Hypertension Maternal Grandmother   . Cancer  Paternal Grandmother        PANCREATIC  . Diabetes Paternal Grandmother   . Hypertension Paternal Grandmother   . Hypertension Maternal Grandfather   . Hypertension Paternal Grandfather    Social History   Tobacco Use  . Smoking status: Never Smoker  . Smokeless tobacco: Never Used  Substance Use Topics  . Alcohol use: Never    Frequency: Never  . Drug use: Never   Allergies  Allergen Reactions  . Bee Venom   . Codeine Itching    Tolerates Hydrocodone OK.  . Ibuprofen Hives and Other (See Comments)    Happened in childhood  . Ibuprofen Hives and Swelling  . Metformin And Related Other (See Comments)    Muscle weakness  . Morphine Itching and Other (See Comments)    Throat swelling  . Penicillins     Childhood Allergy Has patient had a PCN reaction causing immediate rash, facial/tongue/throat swelling, SOB or lightheadedness with hypotension: Unknown Has patient had a PCN reaction causing severe rash involving mucus membranes or skin necrosis: Unknown Has patient had a PCN reaction that required hospitalization: Unknown Has patient had a PCN reaction occurring within the last 10 years: Unknown If all of the above answers are "NO", then may proceed with Cephalosporin use.   . Aspirin Hives, Swelling and Rash        Review of Systems  Constitutional: Positive for fatigue. Negative for chills and fever.  HENT: Negative for sore throat and  trouble swallowing.   Respiratory: Negative for cough and shortness of breath.   Cardiovascular: Negative for chest pain, palpitations and leg swelling.  Gastrointestinal: Positive for nausea. Negative for abdominal pain, constipation and diarrhea.  Genitourinary: Positive for frequency. Negative for dysuria.  Musculoskeletal: Positive for back pain. Negative for gait problem.  Neurological: Positive for dizziness (improved), light-headedness (improved) and numbness.       Objective:   Physical Exam BP 108/72 (BP Location: Left Arm, Patient Position: Sitting, Cuff Size: Large)   Pulse 76   Temp 98.3 F (36.8 C) (Oral)   Resp 18   Ht 5\' 10"  (1.778 m)   Wt 288 lb (130.6 kg)   LMP 09/20/2011   SpO2 99%   BMI 41.32 kg/m Nurse's notes and vital signs reviewed General- obese female in no acute distress but patient appears slightly fatigued ENT-TMs dull, nares with moderate edema of the nasal turbinates, patient with a narrowed posterior airway due to body habitus and large tongue base and mild posterior pharynx erythema Neck-supple, no lymphadenopathy, no thyromegaly, no carotid bruit Lungs-clear to auscultation bilaterally Cardiovascular-regular rate and rhythm Abdomen-truncal obesity, soft and nontender Back-no CVA tenderness Extremities-patient with mild, nonpitting distal lower extremity edema bilaterally       Assessment & Plan:  1. Insulin dependent diabetes mellitus ([place]) Patient's blood sugars continue to be elevated/poorly controlled.  Patient with glucose of 403 at today's visit and patient had urinalysis which did not show any ketones.  Patient reports that she did eat fast food prior to today's visit.  Patient also states that she does not have time to take pre-meal insulin at work.  Patient states that she will be bringing in [facility] paperwork so that she can take some time off from work to get her blood sugars better controlled as well as to get her mental health medications  adjusted.  Patient is to take her insulin when she gets home and to call if her sugars remain greater than 250 over the next few  days.  Blood sugar potentially should be lower if patient takes her insulin as directed and follows her diet.  Patient is being referred to endocrinology for further management of her diabetes and patient will also have CMP at today's visit - Glucose (CBG) - POCT URINALYSIS DIP ([facility]) - Comprehensive metabolic panel - Ambulatory referral to Endocrinology  2. Postsurgical hypothyroidism Patient will have TSH and T4 in follow-up of her hypothyroidism and will be notified if a change is needed in the dose of her thyroid medication based on today's labs - T4 AND TSH  3. Urinary incontinence, mixed Patient will have urinalysis done in follow-up of her complaint of urinary incontinence.  Part of patient's urinary incontinence is likely from frequent urination secondary to her elevated blood sugars.  Urinalysis did not show any signs of infection at today's visit.  Once blood sugars are better control, if patient with continued incontinence, she may benefit from urology or urogynecology referral  4. Essential hypertension Patient is currently on losartan 25 mg.  Blood pressure today's visit is 108/72.  Patient may also have some element of dehydration secondary to blood sugar in the 400s today which will cause urinary frequency.  Patient states that she does feel better on the low-dose of blood pressure medicine and does not feel faint or dizzy if she did when she had to go to the emergency department repeatedly  An After Visit Summary was printed and given to the patient.  Return in about 6 weeks (around 07/04/2018).

## 2018-05-24 ENCOUNTER — Telehealth: Payer: Self-pay | Admitting: Family Medicine

## 2018-05-24 LAB — COMPREHENSIVE METABOLIC PANEL WITH GFR
ALT: 14 IU/L (ref 0–32)
AST: 16 IU/L (ref 0–40)
Albumin/Globulin Ratio: 1.4 (ref 1.2–2.2)
Albumin: 4.2 g/dL (ref 3.5–5.5)
Alkaline Phosphatase: 96 IU/L (ref 39–117)
BUN/Creatinine Ratio: 10 (ref 9–23)
BUN: 11 mg/dL (ref 6–24)
Bilirubin Total: 0.2 mg/dL (ref 0.0–1.2)
CO2: 20 mmol/L (ref 20–29)
Calcium: 9.9 mg/dL (ref 8.7–10.2)
Chloride: 99 mmol/L (ref 96–106)
Creatinine, Ser: 1.12 mg/dL — ABNORMAL HIGH (ref 0.57–1.00)
GFR calc Af Amer: 69 mL/min/1.73
GFR calc non Af Amer: 59 mL/min/1.73 — ABNORMAL LOW
Globulin, Total: 3.1 g/dL (ref 1.5–4.5)
Glucose: 404 mg/dL — ABNORMAL HIGH (ref 65–99)
Potassium: 4.6 mmol/L (ref 3.5–5.2)
Sodium: 135 mmol/L (ref 134–144)
Total Protein: 7.3 g/dL (ref 6.0–8.5)

## 2018-05-24 LAB — T4 AND TSH
T4, Total: 8 ug/dL (ref 4.5–12.0)
TSH: 3.52 u[IU]/mL (ref 0.450–4.500)

## 2018-05-24 NOTE — Telephone Encounter (Signed)
Patient came and dropped off FMLA Papers

## 2018-05-30 NOTE — Telephone Encounter (Signed)
ok 

## 2018-05-30 NOTE — Telephone Encounter (Signed)
I have not seen these papers in my document box. I will forward to [place] to see if she has seen this paperwork

## 2018-05-30 NOTE — Telephone Encounter (Signed)
Ok please let me know if you find them

## 2018-06-01 ENCOUNTER — Encounter: Payer: Self-pay | Admitting: Internal Medicine

## 2018-06-01 ENCOUNTER — Ambulatory Visit (INDEPENDENT_AMBULATORY_CARE_PROVIDER_SITE_OTHER): Payer: BLUE CROSS/BLUE SHIELD | Admitting: Internal Medicine

## 2018-06-01 VITALS — BP 132/78 | HR 77 | Ht 70.0 in | Wt 290.0 lb

## 2018-06-01 DIAGNOSIS — Z794 Long term (current) use of insulin: Secondary | ICD-10-CM | POA: Diagnosis not present

## 2018-06-01 DIAGNOSIS — E890 Postprocedural hypothyroidism: Secondary | ICD-10-CM

## 2018-06-01 DIAGNOSIS — E0500 Thyrotoxicosis with diffuse goiter without thyrotoxic crisis or storm: Secondary | ICD-10-CM

## 2018-06-01 DIAGNOSIS — E1165 Type 2 diabetes mellitus with hyperglycemia: Secondary | ICD-10-CM

## 2018-06-01 MED ORDER — FREESTYLE LIBRE 14 DAY READER DEVI: 1.0000 | Freq: Four times a day (QID) | 0 refills | Status: DC

## 2018-06-01 MED ORDER — FREESTYLE LIBRE 14 DAY SENSOR MISC: 1.0000 | Freq: Four times a day (QID) | 6 refills | Status: DC

## 2018-06-01 NOTE — Progress Notes (Addendum)
Name: Lynn Martinez  MRN/ DOB: [id number], 06-17-1970   Age/ Sex: 47 y.o., female    PCP: Antony Blackbird, MD   Reason for Endocrinology Evaluation: Type 2 Diabetes Mellitus     Date of Initial Endocrinology Visit: 06/03/2018     PATIENT IDENTIFIER: Lynn Martinez is a 47 y.o. female with a past medical history of T2DM, hypothyroidism, bipolar disorder and HTN. The patient presented for initial endocrinology clinic visit on 06/03/2018 for consultative assistance with her diabetes management.    HPI: [name] was    Diagnosed with T2DM ~ 2 yrs Prior Medications tried/Intolerance: Glipizide, Januvia was added later but then switched to insulin  Currently checking blood sugars 1 x / day,  before breakfast.  Hypoglycemia episodes : no            Hemoglobin A1c has ranged from 9.1%  in 2017, peaking at 11.1% in 2019. Patient required assistance for hypoglycemia: no  Patient has required hospitalization within the last 1 year from hyper or hypoglycemia: multiple times in the past year   In terms of diet, the patient drinks sweet tea and ginger ale.   She is a Therapist, art rep.   She is accompanied by her fiance    Thyroid History : She was diagnosed with Graves' disease in 2004. She was initially treated with thionamides. In 07/2003, she was offered treatment with RAI therapy vs surgical options, pt elected to be treated surgically . She underwent total thyroidectomy in 08/15/2003. 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 She was started on LT-4 replacement after surgery and has been compliant with her regimen.    HOME DIABETES REGIMEN: Lantus 55 units BID Novolog 10 units BID     Statin: yes ACE-I/ARB: No Prior Diabetic Education: No   METER DOWNLOAD SUMMARY:   Did not being    DIABETIC COMPLICATIONS: Microvascular complications:    Denies: neuropathy, nephropathy,  retinopathy  Last eye exam: Completed 2019  Macrovascular complications:    Denies: CAD, PVD, CVA   PAST HISTORY: Past Medical History:  Past Medical History:  Diagnosis Date  . Anemia   . Anxiety   . Arthritis    "knees" (09/30/2015)  . Bipolar disorder ([place])   . Depression   . Diabetes mellitus without complication ([place])   . Fibroid    s/p myomectomy 2010, hysterectomy 2013  . Grave's disease   . Heart murmur   . Hormone disorder   . Hypertension   . Hypothyroidism   . 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 Migraine     otc med prn  . PCOS (polycystic ovarian syndrome)   . PMS (premenstrual syndrome)   . PTSD (post-traumatic stress disorder)   . Seasonal allergies   . Thyroid cancer ([place]) 2005  . Thyroid disease   . Type II diabetes mellitus ([place])   . Vertigo    Past Surgical History:  Past Surgical History:  Procedure Laterality Date  . ABDOMINAL HYSTERECTOMY  10/11/2011   Procedure: HYSTERECTOMY ABDOMINAL;  Surgeon: Terrance Mass, MD;  Location: [facility];  Service: Gynecology;  Laterality: N/A;  With Repair of serosa.  . ABDOMINAL HYSTERECTOMY    . CHOLECYSTECTOMY N/A 09/06/2013   Procedure: LAPAROSCOPIC CHOLECYSTECTOMY WITH INTRAOPERATIVE CHOLANGIOGRAM;  Surgeon: Merrie Roof, MD;  Location: [facility];  Service: General;  Laterality: N/A;  . DIAGNOSTIC LAPAROSCOPY    .  DILATION AND CURETTAGE OF UTERUS    . HERNIA REPAIR    . MYOMECTOMY  2010   LAPAROSCOPY  . thyroid removed    . TOTAL THYROIDECTOMY  2006  . UMBILICAL HERNIA REPAIR  1982      Social History:  reports that she has never smoked. She has never used smokeless tobacco. She reports that she does not drink alcohol or use drugs. Family History:  Family History  Problem Relation Age of Onset  . Thyroid disease Mother   . Diabetes Father   . Hypertension Father   . Heart disease Father   . Heart attack Father   . Cancer Maternal Aunt        BRAIN  . Cancer  Maternal Grandmother        LYMPHOMA  . Hypertension Maternal Grandmother   . Cancer Paternal Grandmother        PANCREATIC  . Diabetes Paternal Grandmother   . Hypertension Paternal Grandmother   . Hypertension Maternal Grandfather   . Hypertension Paternal Grandfather      HOME MEDICATIONS: Allergies as of 06/01/2018      Reactions   Bee Venom    Codeine Itching   Tolerates Hydrocodone OK.   Ibuprofen Hives, Other (See Comments)   Happened in childhood   Ibuprofen Hives, Swelling   Metformin And Related Other (See Comments)   Muscle weakness   Morphine Itching, Other (See Comments)   Throat swelling   Penicillins    Childhood Allergy Has patient had a PCN reaction causing immediate rash, facial/tongue/throat swelling, SOB or lightheadedness with hypotension: Unknown Has patient had a PCN reaction causing severe rash involving mucus membranes or skin necrosis: Unknown Has patient had a PCN reaction that required hospitalization: Unknown Has patient had a PCN reaction occurring within the last 10 years: Unknown If all of the above answers are "NO", then may proceed with Cephalosporin use.   Aspirin Hives, Swelling, Rash      Medication List       Accurate as of June 01, 2018 11:59 PM. Always use your most recent med list.        atorvastatin 10 MG tablet Commonly known as:  LIPITOR Take 1 tablet (10 mg total) by mouth daily.   cholecalciferol 1000 units tablet Commonly known as:  VITAMIN D Take 2,000 Units by mouth daily.   diclofenac sodium 1 % Gel Commonly known as:  VOLTAREN Apply 2 g topically daily as needed.   docusate sodium 100 MG capsule Commonly known as:  COLACE Take 2 capsules (200 mg total) by mouth every morning. For stool softner   ferrous sulfate 325 (65 FE) MG tablet Take 1 tablet (325 mg total) by mouth daily with breakfast. For low iron   FREESTYLE LIBRE 14 DAY READER Devi 1 kit by Does not apply route 4 (four) times daily.     FREESTYLE LIBRE 14 DAY SENSOR Misc 1 kit by Does not apply route 4 (four) times daily.   hydrOXYzine 25 MG tablet Commonly known as:  ATARAX/VISTARIL Take 1 tablet (25 mg total) by mouth 2 (two) times daily.   insulin aspart 100 UNIT/ML FlexPen Commonly known as:  NOVOLOG FLEXPEN Inject 10 Units into the skin 2 (two) times daily with a meal.   Insulin Glargine 100 UNIT/ML Solostar Pen Commonly known as:  LANTUS SOLOSTAR Inject 55 Units into the skin 2 (two) times daily.   Insulin Pen Needle 32G X 4 MM Misc Commonly known as:  TRUEPLUS  PEN NEEDLES Use as instructed to inject insulin.   levothyroxine 125 MCG tablet Commonly known as:  SYNTHROID, LEVOTHROID Take 1 tablet (125 mcg total) by mouth daily before breakfast.   lithium carbonate 300 MG capsule Take 600 mg by mouth at bedtime.   lurasidone 80 MG Tabs tablet Commonly known as:  LATUDA Take 80 mg by mouth at bedtime.   ondansetron 4 MG disintegrating tablet Commonly known as:  ZOFRAN ODT Take 1 tablet (4 mg total) by mouth every 6 (six) hours as needed for nausea or vomiting.   prazosin 1 MG capsule Commonly known as:  MINIPRESS Take 1 capsule (1 mg total) by mouth at bedtime and may repeat dose one time if needed. For sleep/nightmares   propranolol 40 MG tablet Commonly known as:  INDERAL Take 1 tablet (40 mg total) by mouth 2 (two) times daily.   sitaGLIPtin 100 MG tablet Commonly known as:  JANUVIA Take 1 tablet (100 mg total) by mouth daily.   tolterodine 4 MG 24 hr capsule Commonly known as:  DETROL LA Take 1 capsule (4 mg total) by mouth daily.        ALLERGIES: Allergies  Allergen Reactions  . Bee Venom   . Codeine Itching    Tolerates Hydrocodone OK.  . Ibuprofen Hives and Other (See Comments)    Happened in childhood  . Ibuprofen Hives and Swelling  . Metformin And Related Other (See Comments)    Muscle weakness  . Morphine Itching and Other (See Comments)    Throat swelling  .  Penicillins     Childhood Allergy Has patient had a PCN reaction causing immediate rash, facial/tongue/throat swelling, SOB or lightheadedness with hypotension: Unknown Has patient had a PCN reaction causing severe rash involving mucus membranes or skin necrosis: Unknown Has patient had a PCN reaction that required hospitalization: Unknown Has patient had a PCN reaction occurring within the last 10 years: Unknown If all of the above answers are "NO", then may proceed with Cephalosporin use.   . Aspirin Hives, Swelling and Rash     REVIEW OF SYSTEMS: A comprehensive ROS was conducted with the patient and is negative except as per HPI and below:  Review of Systems  Constitutional: Negative for fever and weight loss.  HENT: Negative for sinus pain and sore throat.   Eyes: Negative for blurred vision and pain.  Respiratory: Negative for cough and shortness of breath.   Cardiovascular: Negative for chest pain and palpitations.  Gastrointestinal: Negative for diarrhea and nausea.  Genitourinary: Negative for frequency.  Skin: Negative for rash.  Neurological: Negative for tingling and tremors.  Endo/Heme/Allergies: Negative for polydipsia.  Psychiatric/Behavioral: Positive for depression.       Stable       OBJECTIVE:   VITAL SIGNS: BP 132/78 (BP Location: Right Arm, Patient Position: Sitting, Cuff Size: Large)   Pulse 77   Ht _0  (1.778 m)   Wt 290 lb (131.5 kg)   LMP 09/20/2011   SpO2 98%   BMI 41.61 kg/m    PHYSICAL EXAM:  General: Pt appears well and is in NAD  Hydration: Well-hydrated with moist mucous membranes and good skin turgor  HEENT: Head: Unremarkable with good dentition. Oropharynx clear without exudate.  Eyes: External eye exam shows bilateral proptosis with a stare.  EOM intact.   Neck: General: Supple without adenopathy or carotid bruits. Thyroid: Thyroid size normal.  No goiter or nodules appreciated. No thyroid bruit.  Lungs: Clear with good BS bilat  with no rales, rhonchi, or wheezes  Heart: RRR with normal S1 and S2 and no gallops; no murmurs; no rub  Abdomen: Normoactive bowel sounds, soft, nontender, without masses or organomegaly palpable  Extremities:  Lower extremities - No pretibial edema. No lesions.  Skin: Normal texture and temperature to palpation.  Excessive hair growth over the face ,abdomen and extremities- Chronic   Neuro: MS is good with appropriate affect, pt is alert and Ox3    DM foot exam: .deferred    DATA REVIEWED:  Lab Results  Component Value Date   HGBA1C 11.1 (A) 03/15/2018   HGBA1C 11.1 03/15/2018   HGBA1C 11.1 (A) 03/15/2018   HGBA1C 11.1 (A) 03/15/2018   Lab Results  Component Value Date   LDLCALC 66 04/11/2018   CREATININE 1.12 (H) 05/23/2018           ASSESSMENT / PLAN / RECOMMENDATIONS:   1) Type 2 Diabetes Mellitus, poorly controlled, Without complications - Most recent A1c of 11.1% %. Goal A1c < 7.0%.   Plan: GENERAL: I have discussed with the patient the pathophysiology of diabetes. We went over the natural progression of the disease. We talked about both insulin resistance and insulin deficiency. We stressed the importance of lifestyle changes including diet and exercise. I explained the complications associated with diabetes including retinopathy, nephropathy, neuropathy as well as increased risk of cardiovascular disease. We went over the benefit seen with glycemic control.    I explained to the patient that diabetic patients are at higher than normal risk for amputations. The patient was informed that diabetes is the number one cause of non-traumatic amputations in [place].   Discussed pharmacokinetics of basal/bolus insulin and the importance of taking prandial insulin with meals.   We also discussed the importance of avoiding sugar-sweetened beverages and snacks and its effects on controlling hyperglycemia  Pt advised to take Novolog three times a day with meals, she eats 3  meals but was only taking it twice a day .   She has severe insulin resistance, which is multifactorial, PCOS , high BMI and antipsychotic meds.   We discussed future addition of insulin sensitizers, she is intolerant to metformin , but we discussed GLP-2 agonists , SGLT-2 inhibits, I will try to avoid actos but its a great sensitizer and would be the last option.   Pt understands the importance of glucose checks and bringing glucose data for future appointments.   We also discussed the effect of exercise on glucose control and impving insulin sensitivity and reducing weight.   She was provided with a freestyle prescription   Pt has not been taking Januvia due to cost.   MEDICATIONS:  Decrease Lantus to 45 units daily   Novolog 15 units TID with meals   Stop Januvia   EDUCATION / INSTRUCTIONS:  BG monitoring instructions: Patient is instructed to check her blood sugars 4 times a day, before meals and bedtime.  Call [facility] if: BG persistently < 70 or > 300. . I reviewed the Rule of 15 for the treatment of hypoglycemia in detail with the patient. Literature supplied.   2) Diabetic complications:   Eye: Does not have known diabetic retinopathy.   Neuro/ Feet: Does not have known diabetic peripheral neuropathy.  Renal: Patient does not have known baseline CKD. She is not on an ACEI/ARB at present.   3) Lipids: Patient is  on a statin.    4) Hypertension: She is  at goal of < 140/90 mmHg.  5) Graves' Disease:   Pt with proptosis but no evidence of active graves' disease or extra-thyroidal manifestations of thyroid disease  6) Post-Surgical Hypothyroidism :   She is clinically and biochemically euthyroid  Continue Levothyroxine 125 mcg daily  Will defer further management to PCP   Path report shows no evidence of atypia or malignancy as per records from "care everywhere", these results were conveyed to the patient, as she was under the  impression she had possible cancer in her thyroid.   Pt is clinically and bio-chemically euthyroid   F/u in 3 weeks   Signed electronically by: Mack Guise, MD  [facility]  [facility] [address] [place], [place] [address] Phone: [phone] FAX: [phone]   CC: Antony Blackbird, MD [place] [address] Phone: [phone]  Fax: [phone]    Return to Endocrinology clinic as below: Future Appointments  Date Time Provider [place]  06/27/2018  2:00 PM Evans Levee, Melanie Crazier, MD [id number] None  07/04/2018 11:10 AM Antony Blackbird, MD [facility] None

## 2018-06-01 NOTE — Patient Instructions (Addendum)
-   Decrease Lantus to 45 units ONCE a day  - Novolog 15 units WITH each meal - Check sugars before each meal and bedtime  - Brisk walking for approximately 150 minutes  A week  - Cal [place] if your sugar is below 70 or consistently over 300    HOW TO TREAT LOW BLOOD SUGARS (Blood sugar LESS THAN 70 MG/DL)  Please follow the RULE OF 15 for the treatment of hypoglycemia treatment (when your (blood sugars are less than 70 mg/dL)    STEP 1: Take 15 grams of carbohydrates when your blood sugar is low, which includes:   3-4 GLUCOSE TABS  OR  3-4 OZ OF JUICE OR REGULAR SODA OR  ONE TUBE OF GLUCOSE GEL     STEP 2: RECHECK blood sugar in 15 MINUTES STEP 3: If your blood sugar is still low at the 15 minute recheck --> then, go back to STEP 1 and treat AGAIN with another 15 grams of carbohydrates.

## 2018-06-03 MED ORDER — INSULIN ASPART 100 UNIT/ML FLEXPEN: 15.0000 [IU] | PEN_INJECTOR | Freq: Three times a day (TID) | SUBCUTANEOUS | 2 refills | Status: DC

## 2018-06-03 MED ORDER — INSULIN ASPART 100 UNIT/ML FLEXPEN: 10.0000 [IU] | PEN_INJECTOR | Freq: Two times a day (BID) | SUBCUTANEOUS | 2 refills | Status: DC

## 2018-06-03 MED ORDER — INSULIN GLARGINE 100 UNIT/ML SOLOSTAR PEN: 45.0000 [IU] | PEN_INJECTOR | Freq: Every day | SUBCUTANEOUS | 11 refills | Status: DC

## 2018-06-04 NOTE — Telephone Encounter (Signed)
Patient called to check on the status of her FMLA papers. Please follow up with patient

## 2018-06-04 NOTE — Telephone Encounter (Signed)
I do not have the patient's FMLA paperwork.  I am not sure if it was perhaps placed in someone else's mailbox

## 2018-06-07 NOTE — Telephone Encounter (Signed)
Is there anyway she can bring another copy of the paperwork or is there anywhere else up front that you all can look to see if the paperwork was misplaced because I do not have her FMLA paperwork

## 2018-06-07 NOTE — Telephone Encounter (Signed)
Tried to contact patient twice to see if she was able to drop off a copy of her FMLA documents but was not able to reach her. Will try again later.

## 2018-06-11 ENCOUNTER — Telehealth: Payer: Self-pay | Admitting: *Deleted

## 2018-06-11 ENCOUNTER — Telehealth: Payer: Self-pay | Admitting: Internal Medicine

## 2018-06-11 NOTE — Telephone Encounter (Signed)
Please contact patient again. Please try checking the other office mailboxes to make sure that the paperwork was not misplaced

## 2018-06-11 NOTE — Telephone Encounter (Signed)
1. Are you taking any type of steroids or gotten steroid injection recently? no 2. Please give your blood sugar readings for the last three days? Reading for last three days as follow 06/03/18 range from 281 mg/dl -439 mg/dl 12/23//19 range from 378 mg/dl-528 mg/dl 06/05/18 range from 422mg /dl- 471 mg/dl  3. Are you sick or becoming sick? Yes feeling some tiredness and dizziness. 4. Have you tried to correct it with insulin? No  5. Have you been taking/taken today all of the prescribed medications? Yes   Novolog 15 units TID with meals and Lantaus 45 units QHS   Please advise

## 2018-06-11 NOTE — Telephone Encounter (Signed)
Patient made aware of medication change per MD order. Patient was informed orders were to increase Lantus 55 units and Novolog to 18 units. Patient expressed understanding.

## 2018-06-11 NOTE — Telephone Encounter (Signed)
Paitent called stating her blood sugars have been running over 300, stated Dr.Shamleffer said to inform when this occurred. Please Advise, thanks

## 2018-06-11 NOTE — Telephone Encounter (Signed)
Patient states she will bring documents by some time today or tomorrow.

## 2018-06-11 NOTE — Telephone Encounter (Signed)
MA unable to reach the patient due to ringing and disconnecting.

## 2018-06-11 NOTE — Telephone Encounter (Signed)
-----   Message from Antony Blackbird, MD sent at 06/08/2018  5:46 PM EST ----- At her last visit her blood sugar was elevated at 404 and patient with a mild increase in creatinine at 1.12. Her thyroid test however showed that she is on the appropriate dose of thyroid medication as her TSH is now normal (so her constipation was likely not due to her hypothyroidism)

## 2018-06-12 NOTE — Telephone Encounter (Signed)
Paperwork is currently in my box and will be completed

## 2018-06-12 NOTE — Telephone Encounter (Signed)
Patient came in the office and dropped off FMLA paperwork.

## 2018-06-14 ENCOUNTER — Encounter: Payer: Self-pay | Admitting: Family Medicine

## 2018-06-14 NOTE — Telephone Encounter (Signed)
Patient called because she would like to pickup her paperwork today. Patient is upset because paperwork has been left for over 14 days prior when dropped off and would like to get an update on her paperwork Please follow up.

## 2018-06-14 NOTE — Telephone Encounter (Signed)
Called patient and Informed Her that her FMLA Paperwork is ready for pickup. She came by and picked up the paperwork

## 2018-06-19 ENCOUNTER — Emergency Department (HOSPITAL_COMMUNITY)
Admission: EM | Admit: 2018-06-19 | Discharge: 2018-06-19 | Disposition: A | Discharge status: Home / Self Care | Payer: BLUE CROSS/BLUE SHIELD | Attending: Emergency Medicine | Admitting: Emergency Medicine

## 2018-06-19 DIAGNOSIS — R6889 Other general symptoms and signs: Secondary | ICD-10-CM | POA: Insufficient documentation

## 2018-06-19 DIAGNOSIS — Z5321 Procedure and treatment not carried out due to patient leaving prior to being seen by health care provider: Secondary | ICD-10-CM | POA: Diagnosis not present

## 2018-06-19 NOTE — ED Notes (Signed)
Pt was able to get object out of her ear

## 2018-06-20 ENCOUNTER — Ambulatory Visit (INDEPENDENT_AMBULATORY_CARE_PROVIDER_SITE_OTHER): Payer: BLUE CROSS/BLUE SHIELD | Admitting: Internal Medicine

## 2018-06-20 ENCOUNTER — Encounter: Payer: Self-pay | Admitting: Internal Medicine

## 2018-06-20 VITALS — BP 158/86 | HR 91 | Ht 70.0 in | Wt 285.4 lb

## 2018-06-20 DIAGNOSIS — Z794 Long term (current) use of insulin: Secondary | ICD-10-CM

## 2018-06-20 DIAGNOSIS — E1165 Type 2 diabetes mellitus with hyperglycemia: Secondary | ICD-10-CM | POA: Diagnosis not present

## 2018-06-20 DIAGNOSIS — F3132 Bipolar disorder, current episode depressed, moderate: Secondary | ICD-10-CM | POA: Diagnosis not present

## 2018-06-20 LAB — POCT GLYCOSYLATED HEMOGLOBIN (HGB A1C): Hemoglobin A1C: 12.6 % — AB (ref 4.0–5.6)

## 2018-06-20 MED ORDER — PIOGLITAZONE HCL 30 MG PO TABS: 30.0000 mg | ORAL_TABLET | Freq: Every day | ORAL | 6 refills | Status: DC

## 2018-06-20 NOTE — Patient Instructions (Addendum)
-   Continue Lantus at 55 units - Increase Novolog to 22 units with each meal  - Start Pioglitazone at 30 mg once a day  - Call us in 3 weeks with your sugar readings of 3 days prior.  - check sugars before each meal and bedtime - You may use the Ketogenic diet snacks BUT NOT the meals at this time.   - Choose healthy, lower carb lower calorie snacks: toss salad, cooked vegetables, cottage cheese, peanut butter, low fat cheese / string cheese, lower sodium deli meat, tuna salad or chicken salad   HOW TO TREAT LOW BLOOD SUGARS (Blood sugar LESS THAN 70 MG/DL)  Please follow the RULE OF 15 for the treatment of hypoglycemia treatment (when your (blood sugars are less than 70 mg/dL)    STEP 1: Take 15 grams of carbohydrates when your blood sugar is low, which includes:   3-4 GLUCOSE TABS  OR  3-4 OZ OF JUICE OR REGULAR SODA OR  ONE TUBE OF GLUCOSE GEL     STEP 2: RECHECK blood sugar in 15 MINUTES STEP 3: If your blood sugar is still low at the 15 minute recheck --> then, go back to STEP 1 and treat AGAIN with another 15 grams of carbohydrates.

## 2018-06-20 NOTE — Progress Notes (Addendum)
Name: Lynn Martinez  Age/ Sex: 48 y.o., female   MRN/ DOB: [id number], 1971-03-13     PCP: Antony Blackbird, MD   Reason for Endocrinology Evaluation: Type 2 Diabetes Mellitus  Initial Endocrine Consultative Visit: 12/20    PATIENT IDENTIFIER: Lynn Martinez is a 48 y.o. female with a past medical history of T2DM, hypothyroidism, bipolar disorder and HTN . The patient has followed with Endocrinology clinic since 06/01/18 for consultative assistance with management of her diabetes.  DIABETIC HISTORY:  Lynn Martinez was diagnosed with T2DM ~ 2 yrs ago, and started insulin therapy shortly after the diagnosis. She has tried Glipizide and Januvia in the past.  Her hemoglobin A1c has ranged from 9.1%  in 2017, peaking at 11.1% in 2019.   SUBJECTIVE:   During the last visit (06/01/18): Her A1c was 11.1%. We decreased Lantus to 45 units and Novolog 15 units TID QAC   Today (06/20/2018): Lynn Martinez is here with her fiance for a 3 week follow up on her diabetes management. She checks her blood sugars 3 times daily, preprandial. The patient has not had hypoglycemic episodes since the last clinic visit. Otherwise, the patient has not required any recent emergency interventions for hypoglycemia and has not had recent hospitalizations secondary to hyper or hypoglycemic episodes.   She admits to continued consumption of sugar-sweetened beverages and snacks between the meals.  She is interested in the ketogenic diet that one of her friends had talked to her about.  She also admits to forgetting to take Novolog most of the time.   ROS: As per HPI and as detailed below: Review of Systems  Eyes: Negative for blurred vision and pain.  Respiratory: Negative for cough and shortness of breath.   Cardiovascular: Negative for chest pain and palpitations.  Gastrointestinal: Negative for diarrhea and nausea.      HOME DIABETES REGIMEN:  Lantus 55 units novolog 18 units QAC    GLUCOSE LOG:        HISTORY:  Past Medical History:  Past Medical History:  Diagnosis Date  . Anemia   . Anxiety   . Arthritis    "knees" (09/30/2015)  . Bipolar disorder ([place])   . Depression   . Diabetes mellitus without complication ([place])   . Fibroid    s/p myomectomy 2010, hysterectomy 2013  . Grave's disease   . Heart murmur   . Hormone disorder   . Hypertension   . Hypothyroidism   . 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 Migraine     otc med prn  . PCOS (polycystic ovarian syndrome)   . PMS (premenstrual syndrome)   . PTSD (post-traumatic stress disorder)   . Seasonal allergies   . Thyroid cancer ([place]) 2005  . Thyroid disease   . Type II diabetes mellitus ([place])   . Vertigo     Past Surgical History:  Past Surgical History:  Procedure Laterality Date  . ABDOMINAL HYSTERECTOMY  10/11/2011   Procedure: HYSTERECTOMY ABDOMINAL;  Surgeon: Terrance Mass, MD;  Location: [facility];  Service: Gynecology;  Laterality: N/A;  With Repair of serosa.  . ABDOMINAL HYSTERECTOMY    . CHOLECYSTECTOMY N/A 09/06/2013   Procedure: LAPAROSCOPIC CHOLECYSTECTOMY WITH INTRAOPERATIVE CHOLANGIOGRAM;  Surgeon: Merrie Roof, MD;  Location: [facility];  Service: General;  Laterality: N/A;  . DIAGNOSTIC LAPAROSCOPY    . DILATION AND CURETTAGE OF UTERUS    . HERNIA REPAIR    .  MYOMECTOMY  2010   LAPAROSCOPY  . thyroid removed    . TOTAL THYROIDECTOMY  2006  . UMBILICAL HERNIA REPAIR  1982     Social History:  reports that she has never smoked. She has never used smokeless tobacco. She reports that she does not drink alcohol or use drugs. Family History:  Family History  Problem Relation Age of Onset  . Thyroid disease Mother   . Diabetes Father   . Hypertension Father   . Heart disease Father   . Heart attack Father   . Cancer Maternal Aunt        BRAIN  . Cancer Maternal  Grandmother        LYMPHOMA  . Hypertension Maternal Grandmother   . Cancer Paternal Grandmother        PANCREATIC  . Diabetes Paternal Grandmother   . Hypertension Paternal Grandmother   . Hypertension Maternal Grandfather   . Hypertension Paternal Grandfather       HOME MEDICATIONS: Allergies as of 06/20/2018      Reactions   Bee Venom    Codeine Itching   Tolerates Hydrocodone OK.   Ibuprofen Hives, Other (See Comments)   Happened in childhood   Ibuprofen Hives, Swelling   Metformin And Related Other (See Comments)   Muscle weakness   Morphine Itching, Other (See Comments)   Throat swelling   Penicillins    Childhood Allergy Has patient had a PCN reaction causing immediate rash, facial/tongue/throat swelling, SOB or lightheadedness with hypotension: Unknown Has patient had a PCN reaction causing severe rash involving mucus membranes or skin necrosis: Unknown Has patient had a PCN reaction that required hospitalization: Unknown Has patient had a PCN reaction occurring within the last 10 years: Unknown If all of the above answers are "NO", then may proceed with Cephalosporin use.   Aspirin Hives, Swelling, Rash      Medication List       Accurate as of June 20, 2018  9:21 AM. Always use your most recent med list.        atorvastatin 10 MG tablet Commonly known as:  LIPITOR Take 1 tablet (10 mg total) by mouth daily.   cholecalciferol 1000 units tablet Commonly known as:  VITAMIN D Take 2,000 Units by mouth daily.   diclofenac sodium 1 % Gel Commonly known as:  VOLTAREN Apply 2 g topically daily as needed.   docusate sodium 100 MG capsule Commonly known as:  COLACE Take 2 capsules (200 mg total) by mouth every morning. For stool softner   ferrous sulfate 325 (65 FE) MG tablet Take 1 tablet (325 mg total) by mouth daily with breakfast. For low iron   FREESTYLE LIBRE 14 DAY READER Devi 1 kit by Does not apply route 4 (four) times daily.   FREESTYLE LIBRE  14 DAY SENSOR Misc 1 kit by Does not apply route 4 (four) times daily.   hydrOXYzine 25 MG tablet Commonly known as:  ATARAX/VISTARIL Take 1 tablet (25 mg total) by mouth 2 (two) times daily.   insulin aspart 100 UNIT/ML FlexPen Commonly known as:  NOVOLOG FLEXPEN Inject 15 Units into the skin 3 (three) times daily with meals.   Insulin Glargine 100 UNIT/ML Solostar Pen Commonly known as:  LANTUS SOLOSTAR Inject 45 Units into the skin at bedtime.   Insulin Pen Needle 32G X 4 MM Misc Commonly known as:  TRUEPLUS PEN NEEDLES Use as instructed to inject insulin.   levothyroxine 125 MCG tablet Commonly known as:  SYNTHROID, LEVOTHROID Take 1 tablet (125 mcg total) by mouth daily before breakfast.   lithium carbonate 300 MG capsule Take 600 mg by mouth at bedtime.   lurasidone 80 MG Tabs tablet Commonly known as:  LATUDA Take 80 mg by mouth at bedtime.   ondansetron 4 MG disintegrating tablet Commonly known as:  ZOFRAN ODT Take 1 tablet (4 mg total) by mouth every 6 (six) hours as needed for nausea or vomiting.   prazosin 1 MG capsule Commonly known as:  MINIPRESS Take 1 capsule (1 mg total) by mouth at bedtime and may repeat dose one time if needed. For sleep/nightmares   propranolol 40 MG tablet Commonly known as:  INDERAL Take 1 tablet (40 mg total) by mouth 2 (two) times daily.   tolterodine 4 MG 24 hr capsule Commonly known as:  DETROL LA Take 1 capsule (4 mg total) by mouth daily.        OBJECTIVE:   Vital Signs: BP (!) 158/86 (BP Location: Left Arm, Patient Position: Sitting, Cuff Size: Large)   Pulse 91   Ht '5\' 10"'$  (1.778 m)   Wt 285 lb 6.4 oz (129.5 kg)   LMP 09/20/2011   SpO2 98%   BMI 40.95 kg/m   Wt Readings from Last 3 Encounters:  06/20/18 285 lb 6.4 oz (129.5 kg)  06/01/18 290 lb (131.5 kg)  05/23/18 288 lb (130.6 kg)     Exam: General: Pt appears well and is in NAD  Lungs: Clear with good BS bilat with no rales, rhonchi, or wheezes    Heart: RRR with normal S1 and S2 and no gallops; no murmurs; no rub  Abdomen: Normoactive bowel sounds, soft, nontender, without masses or organomegaly palpable  Extremities: No pretibial edema.   Skin: Normal texture and temperature to palpation. No rash noted. No Acanthosis nigricans/skin tags. No lipohypertrophy.  Neuro: MS is good with appropriate affect, pt is alert and Ox3      DATA REVIEWED:  Lab Results  Component Value Date   HGBA1C 11.1 (A) 03/15/2018   HGBA1C 11.1 03/15/2018   HGBA1C 11.1 (A) 03/15/2018   HGBA1C 11.1 (A) 03/15/2018   Lab Results  Component Value Date   LDLCALC 66 04/11/2018   CREATININE 1.12 (H) 05/23/2018    Lab Results  Component Value Date   CHOL 130 04/11/2018   HDL 40 04/11/2018   LDLCALC 66 04/11/2018   TRIG 120 04/11/2018   CHOLHDL 3.3 04/11/2018           ASSESSMENT / PLAN / RECOMMENDATIONS:   1) Type 2 Diabetes Mellitus, Poorly Controlled, Without complications - Most recent A1c of 12.6 %. Goal A1c < 7.0 %.    Plan: - Pt continues with hyperglycemia this is due to suboptimal management and continued dietary indiscretions.  - I have advised her that she can use the ketogenic snacks but not the meal at this time because it will be difficult for her to maneuver prandial insulin with ketogenic meals and increase risk of hypoglycemia.  - She was advised again to avoid sugar-sweetened beverages and that unfortunately if she continues to eat between meals , or drink sugary drinks, it will be impossible to control her sugar.  - We discussed an insulin sensitizer but unfortunately she is intolerant to metformin, she is not able to GLP-1 agonist despite having a co-pay card. We discussed Actos as an insulin sensitizer, she is not very please with side effects of weight gain and swelling but at this time  our main goal and priority is to improve her hyperglycemia and hopefully in the future when her finances improve then we can discuss other  medications such as GLP-1 agonists or SGLT-2 inhibitors.  - She is also has a cost barrier to obtaining freestyle libre .    MEDICATIONS:  Cotinue Lantus at 55 units   Increase Novolog to 22 units   Start Pioglitazone at 30 mg daily - she understands this will take 2 weeks to start working.   She was advised to call us in 3 weeks with glucose readings.   EDUCATION / INSTRUCTIONS:  BG monitoring instructions: Patient is instructed to check her blood sugars 4 times a day,before meals and bedtime.  Call [facility] if: BG persistently < 70 or > 300. . I reviewed the Rule of 15 for the treatment of hypoglycemia in detail with the patient. Literature supplied. Marland Kitchen  2)Post-Surgical Hypothyroidism  - Pt continues to state she was told she has thyroid cancer, not medullary.Her records through care everywhere indicate no evidence of cancer on pathology report. Will try and obtain her pathology report from March, 2005.   F/U in 6 weeks   Addendum: Path report obtained on 06/21/18 as above. NO evidence of atypia   Signed electronically by: Mack Guise, MD  [facility]  [address] [place], [place] [address] Phone: [phone] FAX: [phone]   CC: Antony Blackbird, MD [place] [address] Phone: [phone]  Fax: [phone]  Return to Endocrinology clinic as below: Future Appointments  Date Time Provider [place]  07/04/2018 11:10 AM Antony Blackbird, MD [id number] None

## 2018-06-25 ENCOUNTER — Telehealth: Payer: Self-pay | Admitting: Family Medicine

## 2018-06-25 NOTE — Telephone Encounter (Signed)
Patient came in to drop off paperwork for her life insurance policy in regards to disability. She would like paperwork to be faxed to the local office. Patient provided fax number/business card. Paperwork will be dropped off in PCP box.

## 2018-06-27 ENCOUNTER — Ambulatory Visit: Payer: Self-pay | Admitting: Internal Medicine

## 2018-06-27 DIAGNOSIS — F3132 Bipolar disorder, current episode depressed, moderate: Secondary | ICD-10-CM | POA: Diagnosis not present

## 2018-07-04 ENCOUNTER — Encounter: Payer: BLUE CROSS/BLUE SHIELD | Attending: Internal Medicine | Admitting: *Deleted

## 2018-07-04 ENCOUNTER — Ambulatory Visit: Payer: BLUE CROSS/BLUE SHIELD | Attending: Family Medicine | Admitting: Family Medicine

## 2018-07-04 ENCOUNTER — Encounter: Payer: Self-pay | Admitting: Family Medicine

## 2018-07-04 VITALS — BP 118/78 | HR 85 | Temp 98.9°F | Resp 18 | Ht 70.0 in | Wt 293.0 lb

## 2018-07-04 DIAGNOSIS — IMO0001 Reserved for inherently not codable concepts without codable children: Secondary | ICD-10-CM

## 2018-07-04 DIAGNOSIS — G8929 Other chronic pain: Secondary | ICD-10-CM

## 2018-07-04 DIAGNOSIS — E119 Type 2 diabetes mellitus without complications: Secondary | ICD-10-CM | POA: Diagnosis not present

## 2018-07-04 DIAGNOSIS — Z794 Long term (current) use of insulin: Secondary | ICD-10-CM

## 2018-07-04 DIAGNOSIS — M545 Low back pain, unspecified: Secondary | ICD-10-CM

## 2018-07-04 DIAGNOSIS — E1165 Type 2 diabetes mellitus with hyperglycemia: Secondary | ICD-10-CM

## 2018-07-04 DIAGNOSIS — H539 Unspecified visual disturbance: Secondary | ICD-10-CM | POA: Diagnosis not present

## 2018-07-04 LAB — GLUCOSE, POCT (MANUAL RESULT ENTRY): POC Glucose: 224 mg/dL — AB (ref 70–99)

## 2018-07-04 MED ORDER — METHOCARBAMOL 500 MG PO TABS: 500.0000 mg | ORAL_TABLET | Freq: Three times a day (TID) | ORAL | 3 refills | Status: DC | PRN

## 2018-07-04 NOTE — Progress Notes (Signed)
Subjective:    Patient ID: Lynn Martinez, female    DOB: December 10, 1970, 48 y.o.   MRN: [id number]  HPI       48 year old female with a history of poorly controlled diabetes.  Patient did recently see endocrinology on June 20, 2018.  Patient's hemoglobin A1c at that time was 12.6.  Patient was to continue Lantus at 55 units and increase NovoLog to 22 units and start Actos at 30 mg daily.      At today's visit, patient reports that her blood sugars are improving.  Patient states that now her fasting blood sugars are in the 200s or less.  Patient has made changes as suggested by endocrinology.  Patient is accompanied by her boyfriend at today's visit who states that he is making sure that patient receives healthy meals as he is now doing most of the cooking and he is making sure that patient drinks water instead of soda or juice.  Patient states that she is starting to feel better and has had decreased urinary frequency and decreased thirst.  Patient however states that as her blood sugars have become lower her vision is slightly improved but she still has some visual disturbance.  Patient normally wears glasses and patient states that when her blood sugars were very high she was having a lot of blurred vision.  Patient would like to be referred for diabetic eye exam and to see if she needs any changes in her glasses to help with the changes in her vision.       Patient also reports that she has had chronic issues with low back pain.  Patient states that she had been taking a muscle relaxant but then ran out of medication.  Patient did not think that the muscle relaxant had been helpful therefore she did not call the office regarding a refill.  Patient states however that after being off of the muscle relaxant for a few days she realized that this actually was helping with her lower back pain.  Patient would like a refill of the prior muscle relaxant that she was taking.  Patient does not feel as if  she is having any radiation of pain down either leg.  Patient states that the pain is staying in the center of her lower back.  Pain is dull and sometimes sharp as if something is pressing against her back.  Patient reports that the pain is about a 6 on a 0-to-10 scale and sometimes worse with either prolonged sitting or prolonged standing.  Patient reports that she is still on FMLA leave while her diabetes medications have been adjusted.  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  Past Medical History:  Diagnosis Date  . Anemia   . Anxiety   . Arthritis    "knees" (09/30/2015)  . Bipolar disorder ([place])   . Depression   . Diabetes mellitus without complication ([place])   .  Fibroid    s/p myomectomy 2010, hysterectomy 2013  . Grave's disease   . Heart murmur   . Hormone disorder   . Hypertension   . Hypothyroidism   . 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 Migraine     otc med prn  . PCOS (polycystic ovarian syndrome)   . PMS (premenstrual syndrome)   . PTSD (post-traumatic stress disorder)   . Seasonal allergies   . Thyroid cancer ([place]) 2005  . Thyroid disease   . Type II diabetes mellitus ([place])   . Vertigo    Past Surgical History:  Procedure Laterality Date  . ABDOMINAL HYSTERECTOMY  10/11/2011   Procedure: HYSTERECTOMY ABDOMINAL;  Surgeon: Terrance Mass,  MD;  Location: [facility];  Service: Gynecology;  Laterality: N/A;  With Repair of serosa.  . ABDOMINAL HYSTERECTOMY    . CHOLECYSTECTOMY N/A 09/06/2013   Procedure: LAPAROSCOPIC CHOLECYSTECTOMY WITH INTRAOPERATIVE CHOLANGIOGRAM;  Surgeon: Merrie Roof, MD;  Location: [facility];  Service: General;  Laterality: N/A;  . DIAGNOSTIC LAPAROSCOPY    . DILATION AND CURETTAGE OF UTERUS    . HERNIA REPAIR    . MYOMECTOMY  2010   LAPAROSCOPY  . thyroid removed    . TOTAL THYROIDECTOMY  2006  . UMBILICAL HERNIA REPAIR  1982   Family History  Problem Relation Age of Onset  . Thyroid disease Mother   . Diabetes Father   . Hypertension Father   . Heart disease Father   . Heart attack Father   . Cancer Maternal Aunt        BRAIN  . Cancer Maternal Grandmother        LYMPHOMA  . Hypertension Maternal Grandmother   . Cancer Paternal Grandmother        PANCREATIC  . Diabetes Paternal Grandmother   . Hypertension Paternal Grandmother   . Hypertension Maternal Grandfather   . Hypertension Paternal Grandfather    Social History   Tobacco Use  . Smoking status: Never Smoker  . Smokeless tobacco: Never Used  Substance Use Topics  . Alcohol use: Never    Frequency: Never  . Drug use: Never   Allergies  Allergen Reactions  . Bee Venom   . Codeine Itching    Tolerates Hydrocodone OK.  . Ibuprofen Hives and Other (See Comments)    Happened in childhood  . Ibuprofen Hives and Swelling  . Metformin And Related Other (See Comments)    Muscle weakness  . Morphine Itching and Other (See Comments)    Throat swelling  . Penicillins     Childhood Allergy Has patient had a PCN reaction causing immediate rash, facial/tongue/throat swelling, SOB or lightheadedness with hypotension: Unknown Has patient had a PCN reaction causing severe rash involving mucus membranes or skin necrosis: Unknown Has patient had a PCN reaction that required hospitalization: Unknown Has patient had a PCN reaction occurring  within the last 10 years: Unknown If all of the above answers are "NO", then may proceed with Cephalosporin use.   . Aspirin Hives, Swelling and Rash      Review of Systems  Constitutional: Positive for fatigue (improved). Negative for chills and fever.  HENT: Positive for congestion. Negative for postnasal drip, sinus pressure and sinus pain.   Eyes: Positive for visual disturbance. Negative for photophobia.  Respiratory: Positive for cough. Negative for shortness of breath.   Cardiovascular: Negative for chest pain, palpitations and leg swelling.  Gastrointestinal: Negative for  abdominal pain, constipation, diarrhea and nausea.  Endocrine: Negative for polydipsia, polyphagia and polyuria.  Genitourinary: Positive for frequency (improving as blood sugars stay lower). Negative for dysuria. Flank pain: improving as blood sugars get lower.  Musculoskeletal: Positive for back pain. Negative for gait problem.  Neurological: Positive for dizziness (occasional). Negative for headaches.  Hematological: Negative for adenopathy. Does not bruise/bleed easily.  Psychiatric/Behavioral: Negative for self-injury and suicidal ideas. The patient is nervous/anxious (Improving, medications being changed by patient's psychiatrist).        Objective:   Physical Exam BP 118/78 (BP Location: Right Arm, Patient Position: Sitting, Cuff Size: Large)   Pulse 85   Temp 98.9 F (37.2 C) (Oral)   Resp 18   Ht _0  (1.778 m)   Wt 293 lb (132.9 kg)   LMP 09/20/2011   SpO2 100%   BMI 42.04 kg/m Vital signs reviewed and if present, medical assistance notes reviewed General- well-nourished, well-developed morbidly obese female in no acute distress, patient is accompanied by her boyfriend at today's visit EENT- conjunctiva normal, extraocular movements intact, patient with slight exophthalmic appearance to the eyes.  TMs dull bilaterally, nares with moderate edema of the nasal turbinates, patient with mild  posterior pharynx/tonsillar arch erythema/edema.  Patient with a large tongue base and narrowed posterior airway Neck-large neck size, no lymphadenopathy, supple Lungs-clear to auscultation bilaterally Cardiovascular-regular rate and rhythm Abdomen-soft, nontender, positive truncal obesity Back-no CVA tenderness, patient with mild to moderate lumbar paraspinous spasm and patient with mild tenderness over L4-S1 area of the spine with palpation Psych- patient appears to be in a normal mood, patient actually seems happy and is somewhat talkative at today's visit       Assessment & Plan:  1. Type 2 diabetes mellitus with hyperglycemia, with long-term current use of insulin ([place]) Patient reports that she is made changes in her insulin regimen and eating habits as suggested by endocrinology and patient has additional follow-up in 2 weeks.  Patient reports that her blood sugars have improved and are now less than 200 fasting where they were previously in the 200s to 300s.  Patient congratulated on her efforts to make dietary changes as well as on attempts at exercise.  Patient due to her elevated blood sugars has had some issues with blurred vision as well as changes in visual acuity as her blood sugars are decreasing.  Patient will be referred for diabetic eye exam - Glucose (CBG) - Ambulatory referral to Ophthalmology  2. Visual disturbance Patient with complaint of visual disturbance over the past few months that is slowly improving as her blood sugars improved.  Patient is being referred to ophthalmology for further evaluation and treatment - Ambulatory referral to Ophthalmology  3. Chronic midline low back pain without sciatica Patient with complaint of chronic midline low back pain without radiation.  Patient states that her back pain is improved with the use of Robaxin which she has taken in the past.  Patient is provided with new prescription.  Patient is encouraged to continue efforts at  weight loss which should also help with her back pain.  Patient reports that she has had prior imaging of her lower back and declines order to have repeat x-ray at this time. - methocarbamol (ROBAXIN) 500 MG tablet; Take 1 tablet (500 mg total) by mouth 3 (three) times daily as needed for muscle spasms.  Dispense: 90 tablet; Refill: 3  An After Visit Summary was printed and given to the patient.  Allergies as of  07/04/2018      Reactions   Bee Venom    Codeine Itching   Tolerates Hydrocodone OK.   Ibuprofen Hives, Other (See Comments)   Happened in childhood   Ibuprofen Hives, Swelling   Metformin And Related Other (See Comments)   Muscle weakness   Morphine Itching, Other (See Comments)   Throat swelling   Penicillins    Childhood Allergy Has patient had a PCN reaction causing immediate rash, facial/tongue/throat swelling, SOB or lightheadedness with hypotension: Unknown Has patient had a PCN reaction causing severe rash involving mucus membranes or skin necrosis: Unknown Has patient had a PCN reaction that required hospitalization: Unknown Has patient had a PCN reaction occurring within the last 10 years: Unknown If all of the above answers are "NO", then may proceed with Cephalosporin use.   Aspirin Hives, Swelling, Rash      Medication List       Accurate as of July 04, 2018 11:59 PM. Always use your most recent med list.        atorvastatin 10 MG tablet Commonly known as:  LIPITOR Take 1 tablet (10 mg total) by mouth daily.   cholecalciferol 1000 units tablet Commonly known as:  VITAMIN D Take 2,000 Units by mouth daily.   diclofenac sodium 1 % Gel Commonly known as:  VOLTAREN Apply 2 g topically daily as needed.   docusate sodium 100 MG capsule Commonly known as:  COLACE Take 2 capsules (200 mg total) by mouth every morning. For stool softner   ferrous sulfate 325 (65 FE) MG tablet Take 1 tablet (325 mg total) by mouth daily with breakfast. For low iron     FREESTYLE LIBRE 14 DAY READER Devi 1 kit by Does not apply route 4 (four) times daily.   FREESTYLE LIBRE 14 DAY SENSOR Misc 1 kit by Does not apply route 4 (four) times daily.   hydrOXYzine 25 MG tablet Commonly known as:  ATARAX/VISTARIL Take 1 tablet (25 mg total) by mouth 2 (two) times daily.   insulin aspart 100 UNIT/ML FlexPen Commonly known as:  NOVOLOG FLEXPEN Inject 15 Units into the skin 3 (three) times daily with meals.   Insulin Glargine 100 UNIT/ML Solostar Pen Commonly known as:  LANTUS SOLOSTAR Inject 45 Units into the skin at bedtime.   Insulin Pen Needle 32G X 4 MM Misc Commonly known as:  TRUEPLUS PEN NEEDLES Use as instructed to inject insulin.   levothyroxine 125 MCG tablet Commonly known as:  SYNTHROID, LEVOTHROID Take 1 tablet (125 mcg total) by mouth daily before breakfast.   lithium carbonate 300 MG capsule Take 600 mg by mouth at bedtime.   lurasidone 80 MG Tabs tablet Commonly known as:  LATUDA Take 80 mg by mouth at bedtime.   methocarbamol 500 MG tablet Commonly known as:  ROBAXIN Take 1 tablet (500 mg total) by mouth 3 (three) times daily as needed for muscle spasms.   ondansetron 4 MG disintegrating tablet Commonly known as:  ZOFRAN ODT Take 1 tablet (4 mg total) by mouth every 6 (six) hours as needed for nausea or vomiting.   pioglitazone 30 MG tablet Commonly known as:  ACTOS Take 1 tablet (30 mg total) by mouth daily.   prazosin 1 MG capsule Commonly known as:  MINIPRESS Take 1 capsule (1 mg total) by mouth at bedtime and may repeat dose one time if needed. For sleep/nightmares   propranolol 40 MG tablet Commonly known as:  INDERAL Take 1 tablet (40 mg total) by  mouth 2 (two) times daily.   tolterodine 4 MG 24 hr capsule Commonly known as:  DETROL LA Take 1 capsule (4 mg total) by mouth daily.       Return for DM-3 months or keep next scheduled.

## 2018-07-04 NOTE — Patient Instructions (Signed)

## 2018-07-04 NOTE — Patient Instructions (Signed)
Plan:  Aim for 3 Carb Choices per meal (45 grams) +/- 1 either way  Aim for 0-2 Carbs per snack if hungry  Include protein in moderation with your meals and snacks Consider reading food labels for Total Carbohydrate of foods Continue with your activity level by riding your stationary bike for 30 minutes 3-5 days a week as tolerated Continue checking BG at alternate times per day as directed by MD  Continue taking medication as directed by MD

## 2018-07-05 ENCOUNTER — Encounter: Payer: Self-pay | Admitting: Family Medicine

## 2018-07-09 ENCOUNTER — Encounter: Payer: Self-pay | Admitting: *Deleted

## 2018-07-11 NOTE — Progress Notes (Signed)
Diabetes Self-Management Education  Visit Type: First/Initial  Appt. Start Time: 1400 Appt. End Time: 3329  07/11/2018  [name], identified by name and date of birth, is a 48 y.o. female with a diagnosis of Diabetes: Type 2. She would like to take a holistic approach to her diabetes management. 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 She is currently on Lantus @ 55 units at bedtime and Novolog @ 22 units before each meal.   ASSESSMENT  Height 5\' 10"  (1.778 m), weight 293 lb 6.4 oz (133.1 kg), last menstrual period 09/20/2011. Body mass index is 42.1 kg/m.  Diabetes Self-Management Education - 07/04/18 1427      Visit Information   Visit Type  First/Initial      Initial Visit   Diabetes Type  Type 2    Are you currently following a meal plan?  No    Are you taking your medications as prescribed?  Yes    Date Diagnosed  2018      Health Coping   How would you rate your overall health?  Poor      Psychosocial Assessment   Patient Belief/Attitude about Diabetes  Afraid    Self-care barriers  None    Self-management support  Family    Other persons present  Patient;Spouse/SO    Patient Concerns  Nutrition/Meal planning    Special Needs  None    Preferred Learning Style  No preference indicated    Learning Readiness  Change in progress    How often do you need to have someone help you when you read instructions, pamphlets, or other written materials from your doctor or pharmacy?  1 - Never    What is the last grade level you completed in school?  12      Pre-Education Assessment   Patient understands the diabetes disease and treatment process.  Needs Instruction    Patient understands incorporating nutritional management into lifestyle.  Needs Instruction    Patient undertands incorporating physical activity into lifestyle.  Needs Instruction    Patient understands using medications safely.  Needs Instruction    Patient understands  monitoring blood glucose, interpreting and using results  Needs Instruction    Patient understands prevention, detection, and treatment of acute complications.  Needs Instruction    Patient understands prevention, detection, and treatment of chronic complications.  Needs Instruction    Patient understands how to develop strategies to address psychosocial issues.  Needs Instruction    Patient understands how to develop strategies to promote health/change behavior.  Needs Instruction      Complications   Last HgB A1C per patient/outside source  12.6 %    How often do you check your blood sugar?  > 4 times/day    Number of hypoglycemic episodes per month  0    Have you had a dilated eye exam in the past 12 months?  No    Have you had a dental exam in the past 12 months?  No    Are you checking your feet?  Yes    How many days per week are you checking your feet?  7      Dietary Intake   Breakfast  1 egg, grits, [place] sausage, toast x 1 with butter and jam    Snack (morning)  not usually    Lunch  brings left overs OR soup and sandwich    Snack (afternoon)  occasionally oatmeal cookie OR bean  dip with corn chips    Dinner  pastrami with sauerkraut on chibatta bread, OR meat, starch and vegetables occasionally with fresh fruit bowl    Snack (evening)  meat and vegetables (needs 300 calories for medicine - [place]uda)    Beverage(s)  water, diet soda,       Exercise   Exercise Type  Light (walking / raking leaves)    How many days per week to you exercise?  3    How many minutes per day do you exercise?  30    Total minutes per week of exercise  90      Patient Education   Previous Diabetes Education  Yes (please comment)   when first diagnosed   Disease state   Factors that contribute to the development of diabetes    Nutrition management   Role of diet in the treatment of diabetes and the relationship between the three main macronutrients and blood glucose level;Food label reading, portion  sizes and measuring food.;Carbohydrate counting;Reviewed blood glucose goals for pre and post meals and how to evaluate the patients' food intake on their blood glucose level.    Physical activity and exercise   Role of exercise on diabetes management, blood pressure control and cardiac health.    Medications  Reviewed patients medication for diabetes, action, purpose, timing of dose and side effects.    Monitoring  Identified appropriate SMBG and/or A1C goals.    Chronic complications  Relationship between chronic complications and blood glucose control    Psychosocial adjustment  Role of stress on diabetes      Individualized Goals (developed by patient)   Nutrition  General guidelines for healthy choices and portions discussed;Follow meal plan discussed    Physical Activity  Exercise 3-5 times per week    Medications  take my medication as prescribed    Monitoring   test blood glucose pre and post meals as discussed      Post-Education Assessment   Patient understands the diabetes disease and treatment process.  Demonstrates understanding / competency    Patient understands incorporating nutritional management into lifestyle.  Demonstrates understanding / competency    Patient undertands incorporating physical activity into lifestyle.  Demonstrates understanding / competency    Patient understands using medications safely.  Demonstrates understanding / competency    Patient understands monitoring blood glucose, interpreting and using results  Demonstrates understanding / competency    Patient understands prevention, detection, and treatment of acute complications.  Demonstrates understanding / competency    Patient understands prevention, detection, and treatment of chronic complications.  Demonstrates understanding / competency    Patient understands how to develop strategies to address psychosocial issues.  Demonstrates understanding / competency    Patient understands how to develop  strategies to promote health/change behavior.  Demonstrates understanding / competency      Outcomes   Expected Outcomes  Demonstrated interest in learning. Expect positive outcomes    Future DMSE  PRN    Program Status  Completed       Individualized Plan for Diabetes Self-Management Training:   Learning Objective:  Patient will have a greater understanding of diabetes self-management. Patient education plan is to attend individual and/or group sessions per assessed needs and concerns.   Plan:   Patient Instructions  Plan:  Aim for 3 Carb Choices per meal (45 grams) +/- 1 either way  Aim for 0-2 Carbs per snack if hungry  Include protein in moderation with your meals and snacks Consider  reading food labels for Total Carbohydrate of foods Continue with your activity level by riding your stationary bike for 30 minutes 3-5 days a week as tolerated Continue checking BG at alternate times per day as directed by MD  Continue taking medication as directed by MD  Expected Outcomes:  Demonstrated interest in learning. Expect positive outcomes  Education material provided: Food label handouts, A1C conversion sheet, Meal plan card and Carbohydrate counting sheet  If problems or questions, patient to contact team via:  Phone  Future DSME appointment: PRN

## 2018-08-01 ENCOUNTER — Ambulatory Visit (INDEPENDENT_AMBULATORY_CARE_PROVIDER_SITE_OTHER): Payer: BLUE CROSS/BLUE SHIELD | Admitting: Internal Medicine

## 2018-08-01 ENCOUNTER — Encounter: Payer: Self-pay | Admitting: Internal Medicine

## 2018-08-01 ENCOUNTER — Other Ambulatory Visit: Payer: Self-pay

## 2018-08-01 VITALS — BP 124/80 | HR 81 | Resp 18 | Ht 70.0 in | Wt 287.0 lb

## 2018-08-01 DIAGNOSIS — Z794 Long term (current) use of insulin: Secondary | ICD-10-CM

## 2018-08-01 DIAGNOSIS — F3132 Bipolar disorder, current episode depressed, moderate: Secondary | ICD-10-CM | POA: Diagnosis not present

## 2018-08-01 DIAGNOSIS — E890 Postprocedural hypothyroidism: Secondary | ICD-10-CM

## 2018-08-01 DIAGNOSIS — E1165 Type 2 diabetes mellitus with hyperglycemia: Secondary | ICD-10-CM

## 2018-08-01 LAB — GLUCOSE, POCT (MANUAL RESULT ENTRY): POC Glucose: 108 mg/dl — AB (ref 70–99)

## 2018-08-01 MED ORDER — INSULIN GLARGINE 100 UNIT/ML SOLOSTAR PEN: 55.0000 [IU] | PEN_INJECTOR | Freq: Every day | SUBCUTANEOUS | 11 refills | Status: DC

## 2018-08-01 MED ORDER — INSULIN ASPART 100 UNIT/ML FLEXPEN: 22.0000 [IU] | PEN_INJECTOR | Freq: Three times a day (TID) | SUBCUTANEOUS | 2 refills | Status: DC

## 2018-08-01 NOTE — Progress Notes (Signed)
Name: Lynn Martinez  Age/ Sex: 48 y.o., female   MRN/ DOB: [id number], 28-Jan-1971     PCP: Antony Blackbird, MD   Reason for Endocrinology Evaluation: Type 2 Diabetes Mellitus  Initial Endocrine Consultative Visit: 12/20    PATIENT IDENTIFIER: Lynn Martinez is a 48 y.o. female with a past medical history of T2DM, hypothyroidism, bipolar disorder and HTN . The patient has followed with Endocrinology clinic since 06/01/18 for consultative assistance with management of her diabetes.  DIABETIC HISTORY:  [name] was diagnosed with T2DM ~ 2 yrs ago, and started insulin therapy shortly after the diagnosis. She has tried Glipizide and Januvia in the past.  Her hemoglobin A1c has ranged from 9.1%  in 2017, peaking at 11.1% in 2019.   SUBJECTIVE:   During the last visit (06/20/2018): Due to persistent hyperglycemia with BG's over 250 mg/dL, we have continued Lantus at 55 units, we increased NovoLog to 22 units, we also added pioglitazone 30 mg daily.   Today (08/01/2018): [name] is here with her fiance for a 4 week follow up on her diabetes management. She checks her blood sugars 1 times daily, preprandial.  She has started working and has not been able to check her glucose as often.  The patient has not had hypoglycemic episodes since the last clinic visit. Otherwise, the patient has not required any recent emergency interventions for hypoglycemia and has not had recent hospitalizations secondary to hyper or hypoglycemic episodes.   She denies any side effects to pioglitazone, she has stopped drinking all sugar sweetened beverages.   ROS: As per HPI and as detailed below: Review of Systems  Eyes: Negative for blurred vision and pain.  Respiratory: Negative for cough and shortness of breath.   Cardiovascular: Negative for chest pain and palpitations.  Gastrointestinal: Negative for  diarrhea and nausea.      HOME DIABETES REGIMEN:  Lantus 55 units novolog 22 units QAC    GLUCOSE LOG: Forgot meter     HISTORY:  Past Medical History:  Past Medical History:  Diagnosis Date  . Anemia   . Anxiety   . Arthritis    "knees" (09/30/2015)  . Bipolar disorder ([place])   . Depression   . Diabetes mellitus without complication ([place])   . Fibroid    s/p myomectomy 2010, hysterectomy 2013  . Grave's disease   . Heart murmur   . Hormone disorder   . Hypertension   . Hypothyroidism   . 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 Migraine     otc med prn  . PCOS (polycystic ovarian syndrome)   . PMS (premenstrual syndrome)   . PTSD (post-traumatic stress disorder)   . Seasonal allergies   . Thyroid cancer ([place]) 2005  . Thyroid disease   . Type II diabetes mellitus ([place])   . Vertigo    Past Surgical History:  Past Surgical History:  Procedure Laterality Date  . ABDOMINAL HYSTERECTOMY  10/11/2011   Procedure: HYSTERECTOMY ABDOMINAL;  Surgeon: Terrance Mass, MD;  Location: [facility];  Service: Gynecology;  Laterality: N/A;  With Repair of serosa.  . ABDOMINAL HYSTERECTOMY    . CHOLECYSTECTOMY N/A 09/06/2013   Procedure: LAPAROSCOPIC CHOLECYSTECTOMY WITH INTRAOPERATIVE CHOLANGIOGRAM;  Surgeon: Merrie Roof, MD;  Location: [facility];  Service: General;  Laterality: N/A;  . DIAGNOSTIC LAPAROSCOPY    . DILATION AND CURETTAGE OF UTERUS    . HERNIA REPAIR    .  MYOMECTOMY  2010   LAPAROSCOPY  . thyroid removed    . TOTAL THYROIDECTOMY  2006  . UMBILICAL HERNIA REPAIR  1982    Social History:  reports that she has never smoked. She has never used smokeless tobacco. She reports that she does not drink alcohol or use drugs. Family History:  Family History  Problem Relation Age of Onset  . Thyroid disease Mother   . Diabetes Father   . Hypertension Father   . Heart disease Father   . Heart attack Father   . Cancer Maternal Aunt         BRAIN  . Cancer Maternal Grandmother        LYMPHOMA  . Hypertension Maternal Grandmother   . Cancer Paternal Grandmother        PANCREATIC  . Diabetes Paternal Grandmother   . Hypertension Paternal Grandmother   . Hypertension Maternal Grandfather   . Hypertension Paternal Grandfather      HOME MEDICATIONS: Allergies as of 08/01/2018      Reactions   Bee Venom    Codeine Itching   Tolerates Hydrocodone OK.   Ibuprofen Hives, Other (See Comments)   Happened in childhood   Ibuprofen Hives, Swelling   Metformin And Related Other (See Comments)   Muscle weakness   Morphine Itching, Other (See Comments)   Throat swelling   Penicillins    Childhood Allergy Has patient had a PCN reaction causing immediate rash, facial/tongue/throat swelling, SOB or lightheadedness with hypotension: Unknown Has patient had a PCN reaction causing severe rash involving mucus membranes or skin necrosis: Unknown Has patient had a PCN reaction that required hospitalization: Unknown Has patient had a PCN reaction occurring within the last 10 years: Unknown If all of the above answers are "NO", then may proceed with Cephalosporin use.   Aspirin Hives, Swelling, Rash      Medication List       Accurate as of August 01, 2018 10:19 AM. Always use your most recent med list.        atorvastatin 10 MG tablet Commonly known as:  LIPITOR Take 1 tablet (10 mg total) by mouth daily.   cholecalciferol 1000 units tablet Commonly known as:  VITAMIN D Take 2,000 Units by mouth daily.   diclofenac sodium 1 % Gel Commonly known as:  VOLTAREN Apply 2 g topically daily as needed.   docusate sodium 100 MG capsule Commonly known as:  COLACE Take 2 capsules (200 mg total) by mouth every morning. For stool softner   ferrous sulfate 325 (65 FE) MG tablet Take 1 tablet (325 mg total) by mouth daily with breakfast. For low iron   FREESTYLE LIBRE 14 DAY READER Devi 1 kit by Does not apply route 4 (four) times  daily.   FREESTYLE LIBRE 14 DAY SENSOR Misc 1 kit by Does not apply route 4 (four) times daily.   hydrOXYzine 25 MG tablet Commonly known as:  ATARAX/VISTARIL Take 1 tablet (25 mg total) by mouth 2 (two) times daily.   insulin aspart 100 UNIT/ML FlexPen Commonly known as:  NOVOLOG FLEXPEN Inject 15 Units into the skin 3 (three) times daily with meals.   Insulin Glargine 100 UNIT/ML Solostar Pen Commonly known as:  LANTUS SOLOSTAR Inject 45 Units into the skin at bedtime.   Insulin Pen Needle 32G X 4 MM Misc Commonly known as:  TRUEPLUS PEN NEEDLES Use as instructed to inject insulin.   levothyroxine 125 MCG tablet Commonly known as:  SYNTHROID, LEVOTHROID Take  1 tablet (125 mcg total) by mouth daily before breakfast.   lithium carbonate 300 MG capsule Take 600 mg by mouth at bedtime.   lurasidone 80 MG Tabs tablet Commonly known as:  LATUDA Take 80 mg by mouth at bedtime.   methocarbamol 500 MG tablet Commonly known as:  ROBAXIN Take 1 tablet (500 mg total) by mouth 3 (three) times daily as needed for muscle spasms.   pioglitazone 30 MG tablet Commonly known as:  ACTOS Take 1 tablet (30 mg total) by mouth daily.   prazosin 1 MG capsule Commonly known as:  MINIPRESS Take 1 capsule (1 mg total) by mouth at bedtime and may repeat dose one time if needed. For sleep/nightmares   propranolol 40 MG tablet Commonly known as:  INDERAL Take 1 tablet (40 mg total) by mouth 2 (two) times daily.   tolterodine 4 MG 24 hr capsule Commonly known as:  DETROL LA Take 1 capsule (4 mg total) by mouth daily.        OBJECTIVE:   Vital Signs: BP 124/80 (BP Location: Right Arm, Patient Position: Sitting, Cuff Size: Large)   Pulse 81   Resp 18   Ht '5\' 10"'$  (1.778 m)   Wt 287 lb (130.2 kg)   LMP 09/20/2011   SpO2 97%   BMI 41.18 kg/m   Wt Readings from Last 3 Encounters:  08/01/18 287 lb (130.2 kg)  07/04/18 293 lb 6.4 oz (133.1 kg)  07/04/18 293 lb (132.9 kg)      Exam: General: Pt appears well and is in NAD  Lungs: Clear with good BS bilat with no rales, rhonchi, or wheezes  Heart: RRR with normal S1 and S2 and no gallops; no murmurs; no rub  Abdomen: Normoactive bowel sounds, soft, nontender, without masses or organomegaly palpable  Extremities: No pretibial edema.   Skin: Normal texture and temperature to palpation. No rash noted. No Acanthosis nigricans/skin tags. No lipohypertrophy.  Neuro: MS is good with appropriate affect, pt is alert and Ox3   DM foot exam:08/01/18 The skin of the feet is intact without sores or ulcerations. The pedal pulses are 2+ on right and 2+ on left. The sensation is intact to a screening 5.07, 10 gram monofilament bilaterally    DATA REVIEWED: In - Office glucose 108 mg/dL  Lab Results  Component Value Date   HGBA1C 12.6 (A) 06/20/2018   HGBA1C 11.1 (A) 03/15/2018   HGBA1C 11.1 03/15/2018   HGBA1C 11.1 (A) 03/15/2018   HGBA1C 11.1 (A) 03/15/2018   Lab Results  Component Value Date   LDLCALC 66 04/11/2018   CREATININE 1.12 (H) 05/23/2018     ASSESSMENT / PLAN / RECOMMENDATIONS:   1) Type 2 Diabetes Mellitus, Poorly Controlled, Without complications - Most recent A1c of 12.6 %. Goal A1c < 7.0 %.    Plan: -Praised the patient on lifestyle changes and weight loss.  I have encouraged her to continue avoiding sugar sweetened beverages, and to avoid snacks.  She has her wedding date set up and motivated to take care of herself. -She did not bring her meter today, but based on memory recall sugars have been in the low 100s.  She denies any hypoglycemic episodes. -We will continue with the current regimen   MEDICATIONS:  Cotinue Lantus at 55 units   Continue Novolog 22 units   Continue Pioglitazone at 30 mg daily   EDUCATION / INSTRUCTIONS:  BG monitoring instructions: Patient is instructed to check her blood sugars 4 times a  day,before meals and bedtime.  Call [facility]  if: BG persistently < 70 or > 300. . I reviewed the Rule of 15 for the treatment of hypoglycemia in detail with the patient. Literature supplied.  2)Post-Surgical Hypothyroidism  -Clinically and biochemically she is euthyroid -I have reassured her that I received the pathology report from her thyroidectomy and there is no evidence of atypia or malignancy -Continue levothyroxine 125 MCG daily   F/U in 3 months     Signed electronically by: Mack Guise, MD  [facility] End[facility]  [address] [place], [place] [address] Phone: [phone] FAX: [phone]   CC: Antony Blackbird, MD [place] [address] Phone: [phone]  Fax: [phone]  Return to Endocrinology clinic as below: Future Appointments  Date Time Provider [place]  08/01/2018 10:30 AM Shamleffer, Melanie Crazier, MD [id number] None  10/03/2018 11:10 AM Antony Blackbird, MD [facility] None

## 2018-08-01 NOTE — Patient Instructions (Signed)
-   Continue Lantus at 55 units - Continue Novolog  22 units with each meal  - Continue Pioglitazone at 30 mg once a day  - Check sugars before each meal and bedtime    HOW TO TREAT LOW BLOOD SUGARS (Blood sugar LESS THAN 70 MG/DL)  Please follow the RULE OF 15 for the treatment of hypoglycemia treatment (when your (blood sugars are less than 70 mg/dL)    STEP 1: Take 15 grams of carbohydrates when your blood sugar is low, which includes:   3-4 GLUCOSE TABS  OR  3-4 OZ OF JUICE OR REGULAR SODA OR  ONE TUBE OF GLUCOSE GEL     STEP 2: RECHECK blood sugar in 15 MINUTES STEP 3: If your blood sugar is still low at the 15 minute recheck --> then, go back to STEP 1 and treat AGAIN with another 15 grams of carbohydrates.

## 2018-08-13 ENCOUNTER — Other Ambulatory Visit: Payer: Self-pay | Admitting: Internal Medicine

## 2018-08-13 MED ORDER — INSULIN GLARGINE 100 UNIT/ML SOLOSTAR PEN: 55.0000 [IU] | PEN_INJECTOR | Freq: Every day | SUBCUTANEOUS | 11 refills | Status: DC

## 2018-08-13 MED ORDER — INSULIN ASPART 100 UNIT/ML FLEXPEN: 22.0000 [IU] | PEN_INJECTOR | Freq: Three times a day (TID) | SUBCUTANEOUS | 2 refills | Status: DC

## 2018-08-22 DIAGNOSIS — F3132 Bipolar disorder, current episode depressed, moderate: Secondary | ICD-10-CM | POA: Diagnosis not present

## 2018-09-04 DIAGNOSIS — F3132 Bipolar disorder, current episode depressed, moderate: Secondary | ICD-10-CM | POA: Diagnosis not present

## 2018-09-12 DIAGNOSIS — F3132 Bipolar disorder, current episode depressed, moderate: Secondary | ICD-10-CM | POA: Diagnosis not present

## 2018-09-17 ENCOUNTER — Telehealth: Payer: Self-pay | Admitting: Family Medicine

## 2018-09-17 DIAGNOSIS — E890 Postprocedural hypothyroidism: Secondary | ICD-10-CM

## 2018-09-17 MED ORDER — LEVOTHYROXINE SODIUM 125 MCG PO TABS: 125.0000 ug | ORAL_TABLET | Freq: Every day | ORAL | 2 refills | Status: DC

## 2018-09-17 NOTE — Addendum Note (Signed)
Addended by: Frederich Cha on: 09/17/2018 04:24 PM   Modules accepted: Orders

## 2018-09-17 NOTE — Telephone Encounter (Signed)
1) Medication(s) Requested (by name): levothyroxine 2) Pharmacy of Choice: [facility] on [address]  3) Special Requests:   Approved medications will be sent to the pharmacy, we will reach out if there is an issue.  Requests made after 3pm may not be addressed until the following business day!  If a patient is unsure of the name of the medication(s) please note and ask patient to call back when they are able to provide all info, do not send to responsible party until all information is available!

## 2018-10-03 ENCOUNTER — Encounter: Payer: Self-pay | Admitting: Family Medicine

## 2018-10-03 ENCOUNTER — Other Ambulatory Visit: Payer: Self-pay

## 2018-10-03 ENCOUNTER — Ambulatory Visit: Payer: BLUE CROSS/BLUE SHIELD | Attending: Family Medicine | Admitting: Family Medicine

## 2018-10-03 DIAGNOSIS — E1165 Type 2 diabetes mellitus with hyperglycemia: Secondary | ICD-10-CM

## 2018-10-03 DIAGNOSIS — R238 Other skin changes: Secondary | ICD-10-CM

## 2018-10-03 DIAGNOSIS — R200 Anesthesia of skin: Secondary | ICD-10-CM

## 2018-10-03 DIAGNOSIS — E890 Postprocedural hypothyroidism: Secondary | ICD-10-CM | POA: Diagnosis not present

## 2018-10-03 DIAGNOSIS — R202 Paresthesia of skin: Secondary | ICD-10-CM

## 2018-10-03 DIAGNOSIS — F3132 Bipolar disorder, current episode depressed, moderate: Secondary | ICD-10-CM | POA: Diagnosis not present

## 2018-10-03 DIAGNOSIS — Z794 Long term (current) use of insulin: Secondary | ICD-10-CM

## 2018-10-03 DIAGNOSIS — G43909 Migraine, unspecified, not intractable, without status migrainosus: Secondary | ICD-10-CM

## 2018-10-03 MED ORDER — PROPRANOLOL HCL 40 MG PO TABS: 40.0000 mg | ORAL_TABLET | Freq: Two times a day (BID) | ORAL | 2 refills | Status: DC

## 2018-10-03 MED ORDER — DICLOFENAC SODIUM 1 % TD GEL: TRANSDERMAL | 6 refills | Status: DC

## 2018-10-03 MED ORDER — NYSTATIN 100000 UNIT/GM EX CREA: 1.0000 "application " | TOPICAL_CREAM | Freq: Two times a day (BID) | CUTANEOUS | 3 refills | Status: DC

## 2018-10-03 MED ORDER — LEVOTHYROXINE SODIUM 125 MCG PO TABS: 125.0000 ug | ORAL_TABLET | Freq: Every day | ORAL | 0 refills | Status: DC

## 2018-10-03 NOTE — Progress Notes (Signed)
Virtual Visit via Telephone Note  I connected with Lynn Martinez on 10/03/18 at 11:10 AM EDT by telephone and verified that I am speaking with the correct person using two identifiers.   I discussed the limitations, risks, security and privacy concerns of performing an evaluation and management service by telephone and the availability of in person appointments. I also discussed with the patient that there may be a patient responsible charge related to this service. The patient expressed understanding and agreed to proceed.  Patient location: Home Provider location: Office Call initiated by Emilio Aspen, CMA   History of Present Illness:      48 yo female with type 2 diabetes that in the past has been very poorly controlled but she reports that her fasting blood sugars are now in the 150's or less. She is seeing her endocrinologist and also continues to take her thyroid medication. Since she is now eating better and has lost weight she is having issues with the skin beneath her abdomen and pelvis being loose and rubbing together causing her to have skin breakdown/blistering in this area as well as a raw appearance to her skin and a bad odor to the area at times. She denies any current itching or discharge in this area. She talked with her insurance company who recommended wound care consult for something to keep the area dry.       She denies any increased thirst or urinary frequency related to her DM. She is still have issues with her vision but her follow-up with an eye specialist has been delayed due to COVID-19. She denies any numbness in her feet but over several months she has had some numbness and tingling in her hands which has gradually worsened and now she is awakened at night with pain and numbness in her wrists and fingers, right greater than left. Shaking her hands does decrease the pain and numbness. She would also like refills of all of her current medications sent to a  different pharmacy and she is working with her insurance company to find a mail order pharmacy that will deliver her pills in blister packs. She has returned to work but is currently working from home due to COVID-19.   Past Medical History:  Diagnosis Date  . Anemia   . Anxiety   . Arthritis    "knees" (09/30/2015)  . Bipolar disorder ([place])   . Depression   . Diabetes mellitus without complication ([place])   . Fibroid    s/p myomectomy 2010, hysterectomy 2013  . Grave's disease   . Heart murmur   . Hormone disorder   . Hypertension   . Hypothyroidism   . 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 Migraine     otc med prn  . PCOS (polycystic ovarian syndrome)   . PMS (premenstrual syndrome)   . PTSD (post-traumatic stress disorder)   . Seasonal allergies   . Thyroid cancer ([place]) 2005  . Thyroid disease   . Type II diabetes mellitus ([place])   . Vertigo    Past Surgical History:  Procedure Laterality Date  . ABDOMINAL HYSTERECTOMY  10/11/2011   Procedure: HYSTERECTOMY ABDOMINAL;  Surgeon: Terrance Mass, MD;  Location: [facility];  Service: Gynecology;  Laterality: N/A;  With Repair of serosa.  . ABDOMINAL HYSTERECTOMY    . CHOLECYSTECTOMY N/A 09/06/2013   Procedure: LAPAROSCOPIC CHOLECYSTECTOMY WITH INTRAOPERATIVE CHOLANGIOGRAM;  Surgeon: Merrie Roof, MD;  Location:  [facility];  Service: General;  Laterality: N/A;  . DIAGNOSTIC LAPAROSCOPY    . DILATION AND CURETTAGE OF UTERUS    . HERNIA REPAIR    . MYOMECTOMY  2010   LAPAROSCOPY  . thyroid removed    . TOTAL THYROIDECTOMY  2006  . UMBILICAL HERNIA REPAIR  1982   Family History  Problem Relation Age of Onset  . Thyroid disease Mother   . Diabetes Father   . Hypertension Father   . Heart disease Father   . Heart attack Father   . Cancer Maternal Aunt        BRAIN  . Cancer Maternal Grandmother        LYMPHOMA  . Hypertension Maternal Grandmother   . Cancer Paternal Grandmother        PANCREATIC   . Diabetes Paternal Grandmother   . Hypertension Paternal Grandmother   . Hypertension Maternal Grandfather   . Hypertension Paternal Grandfather    Social History   Tobacco Use  . Smoking status: Never Smoker  . Smokeless tobacco: Never Used  Substance Use Topics  . Alcohol use: Never    Frequency: Never  . Drug use: Never   Allergies  Allergen Reactions  . Bee Venom   . Codeine Itching    Tolerates Hydrocodone OK.  . Ibuprofen Hives and Other (See Comments)    Happened in childhood  . Ibuprofen Hives and Swelling  . Metformin And Related Other (See Comments)    Muscle weakness  . Morphine Itching and Other (See Comments)    Throat swelling  . Penicillins     Childhood Allergy Has patient had a PCN reaction causing immediate rash, facial/tongue/throat swelling, SOB or lightheadedness with hypotension: Unknown Has patient had a PCN reaction causing severe rash involving mucus membranes or skin necrosis: Unknown Has patient had a PCN reaction that required hospitalization: Unknown Has patient had a PCN reaction occurring within the last 10 years: Unknown If all of the above answers are "NO", then may proceed with Cephalosporin use.   . Aspirin Hives, Swelling and Rash   Review of Systems  Constitutional: Negative for chills and fever.  HENT: Negative for congestion and sore throat.   Eyes: Positive for blurred vision. Negative for double vision, pain and redness.  Respiratory: Negative for cough and shortness of breath.   Cardiovascular: Negative for chest pain and palpitations.  Gastrointestinal: Negative for abdominal pain, constipation, diarrhea and nausea.  Genitourinary: Negative for dysuria and frequency.  Musculoskeletal: Positive for joint pain. Negative for myalgias.  Skin: Positive for rash. Negative for itching.  Neurological: Negative for dizziness and headaches (no headaches other than infrequent migraines).  Endo/Heme/Allergies: Negative for polydipsia.  Does not bruise/bleed easily.    Observations/Objective: No vital signs or physical exam done as visit was conducted via telehealth  Assessment and Plan: 1. Type 2 diabetes mellitus with hyperglycemia, with long-term current use of insulin ([place]) Patient reports improvement in blood sugar control due to changes in diet, exercise and medication adherence. She is being followed by and has had most recent blood work done per endocrinology.   2. Skin irritation Prescription provided for nystatin cream to help with skin irritation/discomfort and wound care referral placed for consult and treatment but if recurrent skin infections in this area then will need to consider surgical referral for possible removal of excess skin - nystatin cream (MYCOSTATIN); Apply 1 application topically 2 (two) times daily. To affected skin areas  Dispense: 30 g; Refill: 3 -  AMB referral to wound care center  3. Numbness and tingling in both hands Patient with symptoms suggestive of carpal tunnel syndrome and will be referred to Orthopedics and refill provided for voltaren gel to use in the interim - AMB referral to orthopedics - diclofenac sodium (VOLTAREN) 1 % GEL; Apply up to 4 times daily to painful joint areas  Dispense: 1 Tube; Refill: 6  4. Postsurgical hypothyroidism New Rx sent to patient's pharmacy for levothyroxine and TSH currently being monitored by endocrinology - levothyroxine (SYNTHROID) 125 MCG tablet; Take 1 tablet (125 mcg total) by mouth daily before breakfast.  Dispense: 90 tablet; Refill: 0  5. Migraine without status migrainosus, not intractable, unspecified migraine type Refill of Inderal for prophylaxis of migraines which has been effective as her migraines are now infrequent - propranolol (INDERAL) 40 MG tablet; Take 1 tablet (40 mg total) by mouth 2 (two) times daily.  Dispense: 60 tablet; Refill: 2  Follow Up Instructions:Return in about 2 months (around 12/03/2018) for chronic issues.     I discussed the assessment and treatment plan with the patient. The patient was provided an opportunity to ask questions and all were answered. The patient agreed with the plan and demonstrated an understanding of the instructions.   The patient was advised to call back or seek an in-person evaluation if the symptoms worsen or if the condition fails to improve as anticipated.  I provided 14  minutes of non-face-to-face time during this encounter.   Antony Blackbird, MD

## 2018-10-03 NOTE — Progress Notes (Signed)
DM follow up   Yesterday 152 and today 138 sugar numbers  Medication refills  Spot under her abd where she had her surgery and would like to talk about wound care (insurance nurse said she may need [employer]) that due helps with moisture.   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

## 2018-10-08 ENCOUNTER — Other Ambulatory Visit: Payer: Self-pay

## 2018-10-08 ENCOUNTER — Encounter (INDEPENDENT_AMBULATORY_CARE_PROVIDER_SITE_OTHER): Payer: Self-pay | Admitting: Orthopaedic Surgery

## 2018-10-08 ENCOUNTER — Ambulatory Visit (INDEPENDENT_AMBULATORY_CARE_PROVIDER_SITE_OTHER): Payer: BLUE CROSS/BLUE SHIELD | Admitting: Orthopaedic Surgery

## 2018-10-08 DIAGNOSIS — G5601 Carpal tunnel syndrome, right upper limb: Secondary | ICD-10-CM

## 2018-10-08 DIAGNOSIS — G5602 Carpal tunnel syndrome, left upper limb: Secondary | ICD-10-CM

## 2018-10-08 MED ORDER — TRAMADOL HCL 50 MG PO TABS: 50.0000 mg | ORAL_TABLET | Freq: Four times a day (QID) | ORAL | 0 refills | Status: DC | PRN

## 2018-10-08 NOTE — Progress Notes (Signed)
Office Visit Note   Patient: Lynn Martinez           Date of Birth: [birth year]           MRN: [id number] Visit Date: 10/08/2018              Requested by: Antony Blackbird, MD [place], [place] [address] PCP: Antony Blackbird, MD   Assessment & Plan: Visit Diagnoses:  1. Carpal tunnel syndrome, right upper limb   2. Carpal tunnel syndrome, left upper limb     Plan: Impression is bilateral carpal tunnel syndrome versus diabetic peripheral neuropathy.  We will provide the patient with bilateral wrist splints.  She will use Voltaren gel as needed which she currently has at home.  I will call in one prescription of tramadol.  We will refer her to Dr. Ernestina Patches for bilateral EMG/nerve conduction studies.  She will follow-up with [place] once this has been completed.  I have counseled her for the need to get her hemoglobin A1c below 8.0 should surgical intervention be indicated.  Follow-Up Instructions: Return in about 4 weeks (around 11/05/2018) for to discuss NCS/EMG.   Orders:  No orders of the defined types were placed in this encounter.  Meds ordered this encounter  Medications  . traMADol (ULTRAM) 50 MG tablet    Sig: Take 1 tablet (50 mg total) by mouth every 6 (six) hours as needed.    Dispense:  30 tablet    Refill:  0      Procedures: No procedures performed   Clinical Data: No additional findings.   Subjective: Chief Complaint  Patient presents with  . Right Hand - Pain  . Left Hand - Pain    HPI patient is a pleasant 48 year old ambidextrous female who presents our clinic today with bilateral hand pain and paresthesias right greater than left.  She noticed the symptoms several months ago.  No known injury.  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  She is noticed numbness, tingling and burning to both hands but is unsure which fingers.  She does note that this is aggravated when she is sleeping as well as  when she is trying to open jars and soda bottles.  She also notes decreased strength to both hands.  She has been taking Tylenol without relief of symptoms.  She has not tried wrist splints.  Of note, she is also an uncontrolled type II diabetic with the last hemoglobin A1c of 11.1 in October 2019.  No history of peripheral neuropathy upper or lower extremities that she is aware all.  Review of Systems as detailed in HPI.  All others reviewed and are negative.   Objective: Vital Signs: LMP 09/20/2011   Physical Exam well-developed and well-nourished female in no acute distress.  Alert and oriented x3.  Ortho Exam examination of both hands reveals negative Phalen's and negative Tinel.  No thenar atrophy.  Full sensation distally.  Fingers are warm and well-perfused.  Specialty Comments:  No specialty comments available.  Imaging: No new imaging   PMFS History: Patient Active Problem List   Diagnosis Date Noted  . Carpal tunnel syndrome, right upper limb 10/08/2018  . Carpal tunnel syndrome, left upper limb 10/08/2018  . Pain in the chest 09/30/2015  . Hypertension 09/30/2015  . Type 2 diabetes mellitus with hyperglycemia, with long-term current use of insulin ([place]) 09/30/2015  . Chest pain 09/30/2015  . Cholelithiasis  with acute cholecystitis 09/05/2013  . Borderline behavior 03/14/2012    Class: Chronic  . OCD (obsessive compulsive disorder) 03/14/2012    Class: Chronic  . PTSD (post-traumatic stress disorder) 03/14/2012    Class: Chronic  . 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 Lumbago without sciatica 03/12/2012  . Major depression, recurrent ([place]) 03/08/2012    Class: Chronic  . Snoring 12/02/2011  . PCOS (polycystic ovarian syndrome) 09/05/2011  . Hypothyroidism 08/13/2010  . VITAMIN D DEFICIENCY 08/13/2010  . ANEMIA-NOS 08/13/2010   Past Medical History:  Diagnosis Date  . Anemia   . Anxiety   . Arthritis    "knees" (09/30/2015)   . Bipolar disorder ([place])   . Depression   . Diabetes mellitus without complication ([place])   . Fibroid    s/p myomectomy 2010, hysterectomy 2013  . Grave's disease   . Heart murmur   . Hormone disorder   . Hypertension   . Hypothyroidism   . 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 Migraine     otc med prn  . PCOS (polycystic ovarian syndrome)   . PMS (premenstrual syndrome)   . PTSD (post-traumatic stress disorder)   . Seasonal allergies   . Thyroid cancer ([place]) 2005  . Thyroid disease   . Type II diabetes mellitus ([place])   . Vertigo     Family History  Problem Relation Age of Onset  . Thyroid disease Mother   . Diabetes Father   . Hypertension Father   . Heart disease Father   . Heart attack Father   . Cancer Maternal Aunt        BRAIN  . Cancer Maternal Grandmother        LYMPHOMA  . Hypertension Maternal Grandmother   . Cancer Paternal Grandmother        PANCREATIC  . Diabetes Paternal Grandmother   . Hypertension Paternal Grandmother   . Hypertension Maternal Grandfather   . Hypertension Paternal Grandfather     Past Surgical History:  Procedure Laterality Date  . ABDOMINAL HYSTERECTOMY  10/11/2011   Procedure: HYSTERECTOMY ABDOMINAL;  Surgeon: Terrance Mass, MD;  Location: [facility];  Service: Gynecology;  Laterality: N/A;  With Repair of serosa.  . ABDOMINAL HYSTERECTOMY    . CHOLECYSTECTOMY N/A 09/06/2013   Procedure: LAPAROSCOPIC CHOLECYSTECTOMY WITH INTRAOPERATIVE CHOLANGIOGRAM;  Surgeon: Merrie Roof, MD;  Location: [facility];  Service: General;  Laterality: N/A;  . DIAGNOSTIC LAPAROSCOPY    . DILATION AND CURETTAGE OF UTERUS    . HERNIA REPAIR    . MYOMECTOMY  2010   LAPAROSCOPY  . thyroid removed    . TOTAL THYROIDECTOMY  2006  . UMBILICAL HERNIA REPAIR  1982   Social History   Occupational History  . Not on file  Tobacco Use  . Smoking status: Never Smoker  . Smokeless tobacco: Never Used  Substance and Sexual  Activity  . Alcohol use: Never    Frequency: Never  . Drug use: Never  . Sexual activity: Yes    Birth control/protection: Condom, Coitus interruptus, Surgical

## 2018-10-09 ENCOUNTER — Encounter (HOSPITAL_BASED_OUTPATIENT_CLINIC_OR_DEPARTMENT_OTHER): Payer: BLUE CROSS/BLUE SHIELD | Attending: Internal Medicine

## 2018-10-09 ENCOUNTER — Telehealth (INDEPENDENT_AMBULATORY_CARE_PROVIDER_SITE_OTHER): Payer: Self-pay | Admitting: Orthopaedic Surgery

## 2018-10-09 DIAGNOSIS — B354 Tinea corporis: Secondary | ICD-10-CM | POA: Insufficient documentation

## 2018-10-09 DIAGNOSIS — I890 Lymphedema, not elsewhere classified: Secondary | ICD-10-CM | POA: Diagnosis not present

## 2018-10-09 DIAGNOSIS — X58XXXA Exposure to other specified factors, initial encounter: Secondary | ICD-10-CM | POA: Diagnosis not present

## 2018-10-09 DIAGNOSIS — E669 Obesity, unspecified: Secondary | ICD-10-CM | POA: Diagnosis not present

## 2018-10-09 DIAGNOSIS — S31104A Unspecified open wound of abdominal wall, left lower quadrant without penetration into peritoneal cavity, initial encounter: Secondary | ICD-10-CM | POA: Diagnosis not present

## 2018-10-09 DIAGNOSIS — S31103A Unspecified open wound of abdominal wall, right lower quadrant without penetration into peritoneal cavity, initial encounter: Secondary | ICD-10-CM | POA: Diagnosis not present

## 2018-10-09 NOTE — Telephone Encounter (Signed)
Called pharm and they advised tht pt did not want rx for tramadol at this time. They are going to keep it on file for her but needed diagnosis for this which is carpal tunnel syndrome and that insurance will only approve a 5 day supply unless the pt has surgery.

## 2018-10-09 NOTE — Telephone Encounter (Signed)
Lynn Martinez @ [facility] requesting a callback @ [phone] regarding the Rx for Tramadol.

## 2018-10-17 ENCOUNTER — Encounter: Payer: Self-pay | Admitting: Physical Medicine and Rehabilitation

## 2018-10-24 DIAGNOSIS — F3132 Bipolar disorder, current episode depressed, moderate: Secondary | ICD-10-CM | POA: Diagnosis not present

## 2018-10-30 ENCOUNTER — Telehealth: Payer: Self-pay | Admitting: Nutrition

## 2018-10-31 ENCOUNTER — Other Ambulatory Visit: Payer: Self-pay

## 2018-10-31 ENCOUNTER — Encounter: Payer: Self-pay | Admitting: Internal Medicine

## 2018-10-31 ENCOUNTER — Ambulatory Visit (INDEPENDENT_AMBULATORY_CARE_PROVIDER_SITE_OTHER): Payer: BLUE CROSS/BLUE SHIELD | Admitting: Internal Medicine

## 2018-10-31 VITALS — BP 118/64 | HR 69 | Temp 99.1°F | Ht 70.0 in | Wt 301.8 lb

## 2018-10-31 DIAGNOSIS — E119 Type 2 diabetes mellitus without complications: Secondary | ICD-10-CM | POA: Diagnosis not present

## 2018-10-31 DIAGNOSIS — Z794 Long term (current) use of insulin: Secondary | ICD-10-CM

## 2018-10-31 LAB — POCT GLYCOSYLATED HEMOGLOBIN (HGB A1C): Hemoglobin A1C: 6.4 % — AB (ref 4.0–5.6)

## 2018-10-31 MED ORDER — INSULIN GLARGINE 100 UNIT/ML SOLOSTAR PEN: 50.0000 [IU] | PEN_INJECTOR | Freq: Every day | SUBCUTANEOUS | 11 refills | Status: DC

## 2018-10-31 NOTE — Progress Notes (Signed)
Name: Lynn Martinez  Age/ Sex: 48 y.o., female   MRN/ DOB: [id number], [birth year]     PCP: Antony Blackbird, MD   Reason for Endocrinology Evaluation: Type 2 Diabetes Mellitus  Initial Endocrine Consultative Visit: 12/20    PATIENT IDENTIFIER: [name] is a 48 y.o. female with a past medical history of T2DM, hypothyroidism, bipolar disorder and HTN . The patient has followed with Endocrinology clinic since 06/01/18 for consultative assistance with management of her diabetes.  DIABETIC HISTORY:  [name] was diagnosed with T2DM ~ 2 yrs ago, and started insulin therapy shortly after the diagnosis. She has tried Glipizide and Januvia in the past.  Her hemoglobin A1c has ranged from 9.1%  in 2017, peaking at 11.1% in 2019.   SUBJECTIVE:   During the last visit (08/01/2018): Her BG's were acceptable , no changes were made to her regimen.   Today (10/31/2018): [name] is here for a 3 month  follow up on her diabetes management. She checks her blood sugars 3 times daily, preprandial.  The patient has not had hypoglycemic episodes since the last clinic visit, but has noted tight BG's. Otherwise, the patient has not required any recent emergency interventions for hypoglycemia and has not had recent hospitalizations secondary to hyper or hypoglycemic episodes.   She denies any side effects to pioglitazone, she has stopped drinking all sugar sweetened beverages.   ROS: As per HPI and as detailed below: Review of Systems  Constitutional: Negative for fever and weight loss.  Eyes: Negative for blurred vision and pain.  Respiratory: Negative for cough and shortness of breath.   Cardiovascular: Negative for chest pain and palpitations.  Gastrointestinal: Negative for diarrhea and nausea.  Neurological: Positive for headaches.      HOME DIABETES REGIMEN:  Lantus 55 units novolog 22 units QAC Actos 30 mg daily    GLUCOSE LOG: Meter has a low battery error  77-197 mg/dL      HISTORY:  Past Medical History:  Past Medical History:  Diagnosis Date  . Anemia   . Anxiety   . Arthritis    "knees" (09/30/2015)  . Bipolar disorder ([place])   . Depression   . Diabetes mellitus without complication ([place])   . Fibroid    s/p myomectomy 2010, hysterectomy 2013  . Grave's disease   . Heart murmur   . Hormone disorder   . Hypertension   . Hypothyroidism   . 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 Migraine     otc med prn  . PCOS (polycystic ovarian syndrome)   . PMS (premenstrual syndrome)   . PTSD (post-traumatic stress disorder)   . Seasonal allergies   . Thyroid cancer ([place]) 2005  . Thyroid disease   . Type II diabetes mellitus ([place])   . Vertigo    Past Surgical History:  Past Surgical History:  Procedure Laterality Date  . ABDOMINAL HYSTERECTOMY  10/11/2011   Procedure: HYSTERECTOMY ABDOMINAL;  Surgeon: Terrance Mass, MD;  Location: [facility];  Service: Gynecology;  Laterality: N/A;  With Repair of serosa.  . ABDOMINAL HYSTERECTOMY    . CHOLECYSTECTOMY N/A 09/06/2013   Procedure: LAPAROSCOPIC CHOLECYSTECTOMY WITH INTRAOPERATIVE CHOLANGIOGRAM;  Surgeon: Merrie Roof, MD;  Location: [facility];  Service: General;  Laterality: N/A;  . DIAGNOSTIC LAPAROSCOPY    . DILATION AND CURETTAGE OF UTERUS    . HERNIA REPAIR    . MYOMECTOMY  2010  LAPAROSCOPY  . thyroid removed    . TOTAL THYROIDECTOMY  2006  . UMBILICAL HERNIA REPAIR  1982    Social History:  reports that she has never smoked. She has never used smokeless tobacco. She reports that she does not drink alcohol or use drugs. Family History:  Family History  Problem Relation Age of Onset  . Thyroid disease Mother   . Diabetes Father   . Hypertension Father   . Heart disease Father   . Heart attack Father   . Cancer Maternal Aunt        BRAIN  . Cancer Maternal Grandmother        LYMPHOMA  . Hypertension Maternal Grandmother   . Cancer Paternal  Grandmother        PANCREATIC  . Diabetes Paternal Grandmother   . Hypertension Paternal Grandmother   . Hypertension Maternal Grandfather   . Hypertension Paternal Grandfather      HOME MEDICATIONS: Allergies as of 10/31/2018      Reactions   Bee Venom    Codeine Itching   Tolerates Hydrocodone OK.   Ibuprofen Hives, Other (See Comments)   Happened in childhood   Ibuprofen Hives, Swelling   Metformin And Related Other (See Comments)   Muscle weakness   Morphine Itching, Other (See Comments)   Throat swelling   Penicillins    Childhood Allergy Has patient had a PCN reaction causing immediate rash, facial/tongue/throat swelling, SOB or lightheadedness with hypotension: Unknown Has patient had a PCN reaction causing severe rash involving mucus membranes or skin necrosis: Unknown Has patient had a PCN reaction that required hospitalization: Unknown Has patient had a PCN reaction occurring within the last 10 years: Unknown If all of the above answers are "NO", then may proceed with Cephalosporin use.   Aspirin Hives, Swelling, Rash      Medication List       Accurate as of Oct 31, 2018 11:35 AM. If you have any questions, ask your nurse or doctor.        atorvastatin 10 MG tablet Commonly known as:  LIPITOR Take 1 tablet (10 mg total) by mouth daily.   cholecalciferol 1000 units tablet Commonly known as:  VITAMIN D Take 2,000 Units by mouth daily.   diclofenac sodium 1 % Gel Commonly known as:  VOLTAREN Apply up to 4 times daily to painful joint areas   docusate sodium 100 MG capsule Commonly known as:  COLACE Take 2 capsules (200 mg total) by mouth every morning. For stool softner What changed:    how much to take  when to take this   ferrous sulfate 325 (65 FE) MG tablet Take 1 tablet (325 mg total) by mouth daily with breakfast. For low iron What changed:    how much to take  when to take this  additional instructions   FreeStyle Libre 14 Day Reader  Kerrin Mo 1 kit by Does not apply route 4 (four) times daily.   FreeStyle Libre 14 Day Sensor Misc 1 kit by Does not apply route 4 (four) times daily.   hydrOXYzine 25 MG tablet Commonly known as:  ATARAX/VISTARIL Take 1 tablet (25 mg total) by mouth 2 (two) times daily.   insulin aspart 100 UNIT/ML FlexPen Commonly known as:  NovoLOG FlexPen Inject 22 Units into the skin 3 (three) times daily with meals.   Insulin Glargine 100 UNIT/ML Solostar Pen Commonly known as:  Lantus SoloStar Inject 50 Units into the skin at bedtime. What changed:  how  much to take Changed by:  Dorita Sciara, MD   Insulin Pen Needle 32G X 4 MM Misc Commonly known as:  TRUEplus Pen Needles Use as instructed to inject insulin.   levothyroxine 125 MCG tablet Commonly known as:  SYNTHROID Take 1 tablet (125 mcg total) by mouth daily before breakfast.   lithium carbonate 300 MG capsule Take 600 mg by mouth at bedtime.   lurasidone 80 MG Tabs tablet Commonly known as:  LATUDA Take 80 mg by mouth at bedtime.   methocarbamol 500 MG tablet Commonly known as:  Robaxin Take 1 tablet (500 mg total) by mouth 3 (three) times daily as needed for muscle spasms.   nystatin cream Commonly known as:  MYCOSTATIN Apply 1 application topically 2 (two) times daily. To affected skin areas   pioglitazone 30 MG tablet Commonly known as:  Actos Take 1 tablet (30 mg total) by mouth daily.   prazosin 1 MG capsule Commonly known as:  MINIPRESS Take 1 capsule (1 mg total) by mouth at bedtime and may repeat dose one time if needed. For sleep/nightmares   propranolol 40 MG tablet Commonly known as:  INDERAL Take 1 tablet (40 mg total) by mouth 2 (two) times daily.   tolterodine 4 MG 24 hr capsule Commonly known as:  DETROL LA Take 1 capsule (4 mg total) by mouth daily.   traMADol 50 MG tablet Commonly known as:  ULTRAM Take 1 tablet (50 mg total) by mouth every 6 (six) hours as needed.        OBJECTIVE:    Vital Signs: BP 118/64 (BP Location: Left Arm, Patient Position: Sitting, Cuff Size: Large)   Pulse 69   Temp 99.1 F (37.3 C)   Ht _0  (1.778 m)   Wt (!) 301 lb 12.8 oz (136.9 kg)   LMP 09/20/2011   SpO2 99%   BMI 43.30 kg/m   Wt Readings from Last 3 Encounters:  10/31/18 (!) 301 lb 12.8 oz (136.9 kg)  08/01/18 287 lb (130.2 kg)  07/04/18 293 lb 6.4 oz (133.1 kg)     Exam: General: Pt appears well and is in NAD  Lungs: Clear with good BS bilat with no rales, rhonchi, or wheezes  Heart: RRR with normal S1 and S2 and no gallops; no murmurs; no rub  Abdomen: Normoactive bowel sounds, soft, nontender, without masses or organomegaly palpable  Extremities: No pretibial edema.   Skin: Normal texture and temperature to palpation. No rash noted  Neuro: MS is good with appropriate affect, pt is alert and Ox3   DM foot exam:08/01/18 The skin of the feet is intact without sores or ulcerations. The pedal pulses are 2+ on right and 2+ on left. The sensation is intact to a screening 5.07, 10 gram monofilament bilaterally    DATA REVIEWED: In - Office glucose 108 mg/dL  Lab Results  Component Value Date   HGBA1C 6.4 (A) 10/31/2018   HGBA1C 12.6 (A) 06/20/2018   HGBA1C 11.1 (A) 03/15/2018   Lab Results  Component Value Date   LDLCALC 66 04/11/2018   CREATININE 1.12 (H) 05/23/2018     ASSESSMENT / PLAN / RECOMMENDATIONS:   1) Type 2 Diabetes Mellitus, Optimally Controlled, Without complications - Most recent A1c of 6.4 %. Goal A1c < 7.0 %.  Without hypoglycemia   Plan: -Praised the patient on lifestyle changes and optimal glucose control.  - She denies any hypoglycemic episodes. The lowest has been 77 mg/dL and this is noted in the  morning, hence will adjust insulin as below.  MEDICATIONS:  Decrease Lantus to 50 units   Continue Novolog 22 units   Continue Pioglitazone at 30 mg daily   EDUCATION / INSTRUCTIONS:  BG monitoring instructions: Patient is instructed to  check her blood sugars 4 times a day,before meals and bedtime.  Call [facility] if: BG persistently < 70 or > 300. . I reviewed the Rule of 15 for the treatment of hypoglycemia in detail with the patient. Literature supplied.   F/U in 3 months     Signed electronically by: Mack Guise, MD  [facility]  [address] [place], [place] [address] Phone: [phone] FAX: [phone]   CC: Antony Blackbird, MD [place] [address] Phone: [phone]  Fax: [phone]  Return to Endocrinology clinic as below: Future Appointments  Date Time Provider [place]  11/14/2018  8:30 AM Magnus Sinning, MD [facility] None  01/30/2019 10:50 AM Shamleffer, Melanie Crazier, MD [facility] None

## 2018-10-31 NOTE — Patient Instructions (Signed)
-   Decrease Lantus to 50 units - Continue Novolog  22 units with each meal  - Continue Pioglitazone at 30 mg once a day  - Check sugars before each meal and bedtime    HOW TO TREAT LOW BLOOD SUGARS (Blood sugar LESS THAN 70 MG/DL)  Please follow the RULE OF 15 for the treatment of hypoglycemia treatment (when your (blood sugars are less than 70 mg/dL)    STEP 1: Take 15 grams of carbohydrates when your blood sugar is low, which includes:   3-4 GLUCOSE TABS  OR  3-4 OZ OF JUICE OR REGULAR SODA OR  ONE TUBE OF GLUCOSE GEL     STEP 2: RECHECK blood sugar in 15 MINUTES STEP 3: If your blood sugar is still low at the 15 minute recheck --> then, go back to STEP 1 and treat AGAIN with another 15 grams of carbohydrates.

## 2018-11-14 ENCOUNTER — Ambulatory Visit (INDEPENDENT_AMBULATORY_CARE_PROVIDER_SITE_OTHER): Payer: BLUE CROSS/BLUE SHIELD | Admitting: Physical Medicine and Rehabilitation

## 2018-11-14 ENCOUNTER — Other Ambulatory Visit: Payer: Self-pay

## 2018-11-14 DIAGNOSIS — F3132 Bipolar disorder, current episode depressed, moderate: Secondary | ICD-10-CM | POA: Diagnosis not present

## 2018-11-14 DIAGNOSIS — R202 Paresthesia of skin: Secondary | ICD-10-CM

## 2018-11-14 NOTE — Progress Notes (Signed)
 .  Numeric Pain Rating Scale and Functional Assessment Average Pain 8   In the last MONTH (on 0-10 scale) has pain interfered with the following?  1. General activity like being  able to carry out your everyday physical activities such as walking, climbing stairs, carrying groceries, or moving a chair?  Rating(7)     

## 2018-11-19 NOTE — Progress Notes (Signed)
Lynn Martinez - 48 y.o. female MRN [id number]  Date of birth: [birth year]  Office Visit Note: Visit Date: 11/14/2018 PCP: Antony Blackbird, MD Referred by: Antony Blackbird, MD  Subjective: Chief Complaint  Patient presents with  . Left Hand - Pain, Numbness, Tingling  . Right Hand - Pain, Numbness, Tingling  . Left Wrist - Pain, Numbness  . Right Wrist - Pain, Numbness   HPI:  Lynn Martinez is a 48 y.o. female who comes in today At the request of Dr. Eduard Roux for electrodiagnostic studies of both upper limbs.  She reports since January of this year she has had increasing pain numbness and tingling in both wrist in both hands and in particular the middle ring and fifth digits.  She pretty adamant about the distribution.  This is somewhat nondermatomal.  She does not endorse any specific injury or frank radicular symptoms.  Patient does not carry a diagnosis of polyneuropathy.  She is an insulin-dependent diabetic with morbid obesity.  Her last hemoglobin A1c was in the 6.5 range but prior to that it was above 12.  ROS Otherwise per HPI.  Assessment & Plan: Visit Diagnoses:  1. Paresthesia of skin     Plan: No additional findings.   Meds & Orders: No orders of the defined types were placed in this encounter.   Orders Placed This Encounter  Procedures  . NCV with EMG (electromyography)    Follow-up: Return for  Eduard Roux, M.D..   Procedures: No procedures performed  EMG & NCV Findings: Evaluation of the left median motor nerve showed decreased conduction velocity (Elbow-Wrist, 49 m/s).  The left median (across palm) sensory nerve showed prolonged distal peak latency (Wrist, 3.7 ms).  The left ulnar sensory nerve showed reduced amplitude (10.0 V).  All remaining nerves (as indicated in the following tables) were within normal limits.  Left vs. Right side comparison data for the ulnar motor nerve indicates abnormal L-R velocity difference (A Elbow-B Elbow, 29 m/s).   All remaining left vs. right side differences were within normal limits.    All examined muscles (as indicated in the following table) showed no evidence of electrical instability.    Impression: Essentially NORMAL electrodiagnostic study of both upper limbs.  There is no significant electrodiagnostic evidence of nerve entrapment, brachial plexopathy or generalized peripheral neuropathy.    As you know, purely sensory or demyelinating radiculopathies and chemical radiculitis may not be detected with this particular electrodiagnostic study.  Recommendations: 1.  Follow-up with referring physician. 2.  Continue current management of symptoms.  ___________________________ Laurence Spates FAAPMR Board Certified, American Board of Physical Medicine and Rehabilitation    Nerve Conduction Studies Anti Sensory Summary Table   Stim Site NR Peak (ms) Norm Peak (ms) P-T Amp (V) Norm P-T Amp Site1 Site2 Delta-P (ms) Dist (cm) Vel (m/s) Norm Vel (m/s)  Left Median Acr Palm Anti Sensory (2nd Digit)  34C  Wrist    *3.7 <3.6 29.3 >10 Wrist Palm 1.9 0.0    Palm    1.8 <2.0 32.6         Right Median Acr Palm Anti Sensory (2nd Digit)  31.8C  Wrist    3.4 <3.6 37.5 >10 Wrist Palm 1.7 0.0    Palm    1.7 <2.0 40.4         Left Radial Anti Sensory (Base 1st Digit)  34.2C  Wrist    2.1 <3.1 24.5  Wrist Base 1st Digit 2.1 0.0  Right Radial Anti Sensory (Base 1st Digit)  31.6C  Wrist    2.0 <3.1 20.5  Wrist Base 1st Digit 2.0 0.0    Left Ulnar Anti Sensory (5th Digit)  34.9C  Wrist    3.6 <3.7 *10.0 >15.0 Wrist 5th Digit 3.6 14.0 39 >38  Right Ulnar Anti Sensory (5th Digit)  31.8C  Wrist    3.4 <3.7 17.6 >15.0 Wrist 5th Digit 3.4 14.0 41 >38   Motor Summary Table   Stim Site NR Onset (ms) Norm Onset (ms) O-P Amp (mV) Norm O-P Amp Site1 Site2 Delta-0 (ms) Dist (cm) Vel (m/s) Norm Vel (m/s)  Left Median Motor (Abd Poll Brev)  33.5C  Wrist    3.8 <4.2 8.0 >5 Elbow Wrist 4.8 23.5 *49 >50  Elbow     8.6  3.0         Right Median Motor (Abd Poll Brev)  32.1C  Wrist    3.4 <4.2 8.3 >5 Elbow Wrist 4.5 23.0 51 >50  Elbow    7.9  6.2         Left Ulnar Motor (Abd Dig Min)  32.8C  Wrist    3.4 <4.2 9.2 >3 B Elbow Wrist 3.6 21.5 60 >53  B Elbow    7.0  6.3  A Elbow B Elbow 1.4 10.0 71 >53  A Elbow    8.4  6.0         Right Ulnar Motor (Abd Dig Min)  32.3C  Wrist    3.1 <4.2 9.4 >3 B Elbow Wrist 3.7 21.0 57 >53  B Elbow    6.8  6.9  A Elbow B Elbow 1.0 10.0 100 >53  A Elbow    7.8  6.3          EMG   Side Muscle Nerve Root Ins Act Fibs Psw Amp Dur Poly Recrt Int Fraser Din Comment  Right Abd Poll Brev Median C8-T1 Nml Nml Nml Nml Nml 0 Nml Nml   Right 1stDorInt Ulnar C8-T1 Nml Nml Nml Nml Nml 0 Nml Nml     Nerve Conduction Studies Anti Sensory Left/Right Comparison   Stim Site L Lat (ms) R Lat (ms) L-R Lat (ms) L Amp (V) R Amp (V) L-R Amp (%) Site1 Site2 L Vel (m/s) R Vel (m/s) L-R Vel (m/s)  Median Acr Palm Anti Sensory (2nd Digit)  34C  Wrist *3.7 3.4 0.3 29.3 37.5 21.9 Wrist Palm     Palm 1.8 1.7 0.1 32.6 40.4 19.3       Radial Anti Sensory (Base 1st Digit)  34.2C  Wrist 2.1 2.0 0.1 24.5 20.5 16.3 Wrist Base 1st Digit     Ulnar Anti Sensory (5th Digit)  34.9C  Wrist 3.6 3.4 0.2 *10.0 17.6 43.2 Wrist 5th Digit 39 41 2   Motor Left/Right Comparison   Stim Site L Lat (ms) R Lat (ms) L-R Lat (ms) L Amp (mV) R Amp (mV) L-R Amp (%) Site1 Site2 L Vel (m/s) R Vel (m/s) L-R Vel (m/s)  Median Motor (Abd Poll Brev)  33.5C  Wrist 3.8 3.4 0.4 8.0 8.3 3.6 Elbow Wrist *49 51 2  Elbow 8.6 7.9 0.7 3.0 6.2 51.6       Ulnar Motor (Abd Dig Min)  32.8C  Wrist 3.4 3.1 0.3 9.2 9.4 2.1 B Elbow Wrist 60 57 3  B Elbow 7.0 6.8 0.2 6.3 6.9 8.7 A Elbow B Elbow 71 100 *29  A Elbow 8.4 7.8 0.6 6.0 6.3 4.8  Waveforms:                     Clinical History: No specialty comments available.     Objective:  VS:  HT:    WT:   BMI:     BP:   HR: bpm  TEMP: ( )  RESP:  Physical  Exam Musculoskeletal:        General: No swelling, tenderness or deformity.     Comments: Inspection reveals no atrophy of the bilateral APB or FDI or hand intrinsics. There is no swelling, color changes, allodynia or dystrophic changes. There is 5 out of 5 strength in the bilateral wrist extension, finger abduction and long finger flexion. There is intact sensation to light touch in all dermatomal and peripheral nerve distributions.  There is a negative Phalen's test bilaterally. There is a negative Hoffmann's test bilaterally.  Skin:    General: Skin is warm and dry.     Findings: No erythema or rash.  Neurological:     General: No focal deficit present.     Mental Status: She is alert and oriented to person, place, and time.     Motor: No weakness or abnormal muscle tone.     Coordination: Coordination normal.  Psychiatric:        Mood and Affect: Mood normal.        Behavior: Behavior normal.     Ortho Exam Imaging: No results found.

## 2018-11-19 NOTE — Procedures (Signed)
EMG & NCV Findings: Evaluation of the left median motor nerve showed decreased conduction velocity (Elbow-Wrist, 49 m/s).  The left median (across palm) sensory nerve showed prolonged distal peak latency (Wrist, 3.7 ms).  The left ulnar sensory nerve showed reduced amplitude (10.0 V).  All remaining nerves (as indicated in the following tables) were within normal limits.  Left vs. Right side comparison data for the ulnar motor nerve indicates abnormal L-R velocity difference (A Elbow-B Elbow, 29 m/s).  All remaining left vs. right side differences were within normal limits.    All examined muscles (as indicated in the following table) showed no evidence of electrical instability.    Impression: Essentially NORMAL electrodiagnostic study of both upper limbs.  There is no significant electrodiagnostic evidence of nerve entrapment, brachial plexopathy or generalized peripheral neuropathy.    As you know, purely sensory or demyelinating radiculopathies and chemical radiculitis may not be detected with this particular electrodiagnostic study.  Recommendations: 1.  Follow-up with referring physician. 2.  Continue current management of symptoms.  ___________________________ Laurence Spates FAAPMR Board Certified, American Board of Physical Medicine and Rehabilitation    Nerve Conduction Studies Anti Sensory Summary Table   Stim Site NR Peak (ms) Norm Peak (ms) P-T Amp (V) Norm P-T Amp Site1 Site2 Delta-P (ms) Dist (cm) Vel (m/s) Norm Vel (m/s)  Left Median Acr Palm Anti Sensory (2nd Digit)  34C  Wrist    *3.7 <3.6 29.3 >10 Wrist Palm 1.9 0.0    Palm    1.8 <2.0 32.6         Right Median Acr Palm Anti Sensory (2nd Digit)  31.8C  Wrist    3.4 <3.6 37.5 >10 Wrist Palm 1.7 0.0    Palm    1.7 <2.0 40.4         Left Radial Anti Sensory (Base 1st Digit)  34.2C  Wrist    2.1 <3.1 24.5  Wrist Base 1st Digit 2.1 0.0    Right Radial Anti Sensory (Base 1st Digit)  31.6C  Wrist    2.0 <3.1 20.5  Wrist  Base 1st Digit 2.0 0.0    Left Ulnar Anti Sensory (5th Digit)  34.9C  Wrist    3.6 <3.7 *10.0 >15.0 Wrist 5th Digit 3.6 14.0 39 >38  Right Ulnar Anti Sensory (5th Digit)  31.8C  Wrist    3.4 <3.7 17.6 >15.0 Wrist 5th Digit 3.4 14.0 41 >38   Motor Summary Table   Stim Site NR Onset (ms) Norm Onset (ms) O-P Amp (mV) Norm O-P Amp Site1 Site2 Delta-0 (ms) Dist (cm) Vel (m/s) Norm Vel (m/s)  Left Median Motor (Abd Poll Brev)  33.5C  Wrist    3.8 <4.2 8.0 >5 Elbow Wrist 4.8 23.5 *49 >50  Elbow    8.6  3.0         Right Median Motor (Abd Poll Brev)  32.1C  Wrist    3.4 <4.2 8.3 >5 Elbow Wrist 4.5 23.0 51 >50  Elbow    7.9  6.2         Left Ulnar Motor (Abd Dig Min)  32.8C  Wrist    3.4 <4.2 9.2 >3 B Elbow Wrist 3.6 21.5 60 >53  B Elbow    7.0  6.3  A Elbow B Elbow 1.4 10.0 71 >53  A Elbow    8.4  6.0         Right Ulnar Motor (Abd Dig Min)  32.3C  Wrist    3.1 <4.2  9.4 >3 B Elbow Wrist 3.7 21.0 57 >53  B Elbow    6.8  6.9  A Elbow B Elbow 1.0 10.0 100 >53  A Elbow    7.8  6.3          EMG   Side Muscle Nerve Root Ins Act Fibs Psw Amp Dur Poly Recrt Int Fraser Din Comment  Right Abd Poll Brev Median C8-T1 Nml Nml Nml Nml Nml 0 Nml Nml   Right 1stDorInt Ulnar C8-T1 Nml Nml Nml Nml Nml 0 Nml Nml     Nerve Conduction Studies Anti Sensory Left/Right Comparison   Stim Site L Lat (ms) R Lat (ms) L-R Lat (ms) L Amp (V) R Amp (V) L-R Amp (%) Site1 Site2 L Vel (m/s) R Vel (m/s) L-R Vel (m/s)  Median Acr Palm Anti Sensory (2nd Digit)  34C  Wrist *3.7 3.4 0.3 29.3 37.5 21.9 Wrist Palm     Palm 1.8 1.7 0.1 32.6 40.4 19.3       Radial Anti Sensory (Base 1st Digit)  34.2C  Wrist 2.1 2.0 0.1 24.5 20.5 16.3 Wrist Base 1st Digit     Ulnar Anti Sensory (5th Digit)  34.9C  Wrist 3.6 3.4 0.2 *10.0 17.6 43.2 Wrist 5th Digit 39 41 2   Motor Left/Right Comparison   Stim Site L Lat (ms) R Lat (ms) L-R Lat (ms) L Amp (mV) R Amp (mV) L-R Amp (%) Site1 Site2 L Vel (m/s) R Vel (m/s) L-R Vel (m/s)   Median Motor (Abd Poll Brev)  33.5C  Wrist 3.8 3.4 0.4 8.0 8.3 3.6 Elbow Wrist *49 51 2  Elbow 8.6 7.9 0.7 3.0 6.2 51.6       Ulnar Motor (Abd Dig Min)  32.8C  Wrist 3.4 3.1 0.3 9.2 9.4 2.1 B Elbow Wrist 60 57 3  B Elbow 7.0 6.8 0.2 6.3 6.9 8.7 A Elbow B Elbow 71 100 *29  A Elbow 8.4 7.8 0.6 6.0 6.3 4.8          Waveforms:

## 2018-11-21 ENCOUNTER — Encounter: Payer: Self-pay | Admitting: Orthopaedic Surgery

## 2018-11-21 ENCOUNTER — Ambulatory Visit (INDEPENDENT_AMBULATORY_CARE_PROVIDER_SITE_OTHER): Payer: BC Managed Care – PPO | Admitting: Orthopaedic Surgery

## 2018-11-21 ENCOUNTER — Other Ambulatory Visit: Payer: Self-pay

## 2018-11-21 ENCOUNTER — Ambulatory Visit: Payer: Self-pay

## 2018-11-21 DIAGNOSIS — M5412 Radiculopathy, cervical region: Secondary | ICD-10-CM

## 2018-11-21 MED ORDER — METHYLPREDNISOLONE 4 MG PO TBPK: ORAL_TABLET | ORAL | 0 refills | Status: DC

## 2018-11-21 MED ORDER — GABAPENTIN 300 MG PO CAPS: ORAL_CAPSULE | ORAL | 1 refills | Status: DC

## 2018-11-21 NOTE — Progress Notes (Signed)
Office Visit Note   Patient: Lynn Martinez           Date of Birth: 04/23/1971           MRN: [id number] Visit Date: 11/21/2018              Requested by: Antony Blackbird, MD [place], [place] [address] PCP: Antony Blackbird, MD   Assessment & Plan: Visit Diagnoses:  1. Radiculopathy, cervical region     Plan: Impression is cervical spine radiculopathy.  We will start the patient on methylprednisolone taper and gabapentin.  She will follow-up with [place] as needed.  Call with concerns or questions in the meantime.  Follow-Up Instructions: Return if symptoms worsen or fail to improve.   Orders:  Orders Placed This Encounter  Procedures  . XR Cervical Spine 2 or 3 views   Meds ordered this encounter  Medications  . methylPREDNISolone (MEDROL DOSEPAK) 4 MG TBPK tablet    Sig: Take as directed    Dispense:  21 tablet    Refill:  0  . gabapentin (NEURONTIN) 300 MG capsule    Sig: Take one cap qd x 1 day, then one cap bix x 1 day and then one cap tid thereafter    Dispense:  90 capsule    Refill:  1      Procedures: No procedures performed   Clinical Data: No additional findings.   Subjective: Chief Complaint  Patient presents with  . Right Hand - Follow-up  . Left Hand - Follow-up    HPI patient is a pleasant 48 year old female who presents our clinic today to discuss nerve conduction studies bilateral upper extremities.  Nerve conduction studies from 11/14/2018 were normal. She has been complaining of numbness and tingling to both hands over the past several months.  She initially noted that she was unsure of which fingers were affected, but today she notes it is primarily her thumb and small fingers.  She is tried wrist splints which have been of minimal relief.  She denies any pain to the neck.  No previous neck pathology.  Of note, she is a type II diabetic and her most recent A1c was 6.4.  Review of Systems as detailed in HPI.  All others  reviewed and are negative.   Objective: Vital Signs: LMP 09/20/2011   Physical Exam well-developed and well-nourished female no acute distress.  Alert and oriented x3.  Ortho Exam examination of both wrists reveal negative Tinel and negative Phalen.  She is neurovascular intact distally.  Cervical spine shows no spinous or paraspinous tenderness.  She does have tenderness to the right parascapular trigger points.  Full range of motion cervical spine without pain.  Specialty Comments:  No specialty comments available.  Imaging: Xr Cervical Spine 2 Or 3 Views  Result Date: 11/21/2018 X-rays demonstrate significant degenerative disc disease C4-5 and C6-7    PMFS History: Patient Active Problem List   Diagnosis Date Noted  . Radiculopathy, cervical region 11/21/2018  . Type 2 diabetes mellitus without complication, with long-term current use of insulin ([place]) 10/31/2018  . Carpal tunnel syndrome, right upper limb 10/08/2018  . Carpal tunnel syndrome, left upper limb 10/08/2018  . Pain in the chest 09/30/2015  . Hypertension 09/30/2015  . Type 2 diabetes mellitus with hyperglycemia, with long-term current use of insulin ([place]) 09/30/2015  . Chest pain 09/30/2015  . Cholelithiasis with acute cholecystitis 09/05/2013  . Borderline behavior 03/14/2012    Class: Chronic  .  OCD (obsessive compulsive disorder) 03/14/2012    Class: Chronic  . PTSD (post-traumatic stress disorder) 03/14/2012    Class: Chronic  . 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 Lumbago without sciatica 03/12/2012  . Major depression, recurrent ([place]) 03/08/2012    Class: Chronic  . Snoring 12/02/2011  . PCOS (polycystic ovarian syndrome) 09/05/2011  . Hypothyroidism 08/13/2010  . VITAMIN D DEFICIENCY 08/13/2010  . ANEMIA-NOS 08/13/2010   Past Medical History:  Diagnosis Date  . Anemia   . Anxiety   . Arthritis    "knees" (09/30/2015)  . Bipolar disorder ([place])   .  Depression   . Diabetes mellitus without complication ([place])   . Fibroid    s/p myomectomy 2010, hysterectomy 2013  . Grave's disease   . Heart murmur   . Hormone disorder   . Hypertension   . Hypothyroidism   . 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 Migraine     otc med prn  . PCOS (polycystic ovarian syndrome)   . PMS (premenstrual syndrome)   . PTSD (post-traumatic stress disorder)   . Seasonal allergies   . Thyroid cancer ([place]) 2005  . Thyroid disease   . Type II diabetes mellitus ([place])   . Vertigo     Family History  Problem Relation Age of Onset  . Thyroid disease Mother   . Diabetes Father   . Hypertension Father   . Heart disease Father   . Heart attack Father   . Cancer Maternal Aunt        BRAIN  . Cancer Maternal Grandmother        LYMPHOMA  . Hypertension Maternal Grandmother   . Cancer Paternal Grandmother        PANCREATIC  . Diabetes Paternal Grandmother   . Hypertension Paternal Grandmother   . Hypertension Maternal Grandfather   . Hypertension Paternal Grandfather     Past Surgical History:  Procedure Laterality Date  . ABDOMINAL HYSTERECTOMY  10/11/2011   Procedure: HYSTERECTOMY ABDOMINAL;  Surgeon: Terrance Mass, MD;  Location: [facility];  Service: Gynecology;  Laterality: N/A;  With Repair of serosa.  . ABDOMINAL HYSTERECTOMY    . CHOLECYSTECTOMY N/A 09/06/2013   Procedure: LAPAROSCOPIC CHOLECYSTECTOMY WITH INTRAOPERATIVE CHOLANGIOGRAM;  Surgeon: Merrie Roof, MD;  Location: [facility];  Service: General;  Laterality: N/A;  . DIAGNOSTIC LAPAROSCOPY    . DILATION AND CURETTAGE OF UTERUS    . HERNIA REPAIR    . MYOMECTOMY  2010   LAPAROSCOPY  . thyroid removed    . TOTAL THYROIDECTOMY  2006  . UMBILICAL HERNIA REPAIR  1982   Social History   Occupational History  . Not on file  Tobacco Use  . Smoking status: Never Smoker  . Smokeless tobacco: Never Used  Substance and Sexual Activity  . Alcohol use: Never     Frequency: Never  . Drug use: Never  . Sexual activity: Yes    Birth control/protection: Condom, Coitus interruptus, Surgical

## 2018-11-25 ENCOUNTER — Other Ambulatory Visit (INDEPENDENT_AMBULATORY_CARE_PROVIDER_SITE_OTHER): Payer: Self-pay | Admitting: Physician Assistant

## 2018-12-13 ENCOUNTER — Telehealth: Payer: Self-pay | Admitting: Internal Medicine

## 2018-12-13 NOTE — Telephone Encounter (Signed)
Pt is requesting an email to get set up/linked on [employer] [email or web address]

## 2018-12-17 NOTE — Telephone Encounter (Signed)
E mail sent

## 2018-12-25 NOTE — Telephone Encounter (Signed)
Opened in error

## 2019-01-28 ENCOUNTER — Ambulatory Visit (HOSPITAL_COMMUNITY)
Admission: EM | Admit: 2019-01-28 | Discharge: 2019-01-28 | Disposition: A | Discharge status: Home / Self Care | Payer: BC Managed Care – PPO | Attending: Physician Assistant | Admitting: Physician Assistant

## 2019-01-28 ENCOUNTER — Encounter (HOSPITAL_COMMUNITY): Payer: Self-pay

## 2019-01-28 DIAGNOSIS — H1032 Unspecified acute conjunctivitis, left eye: Secondary | ICD-10-CM

## 2019-01-28 MED ORDER — TOBRAMYCIN 0.3 % OP SOLN: 1.0000 [drp] | OPHTHALMIC | 0 refills | Status: AC

## 2019-01-28 MED ORDER — CEPHALEXIN 500 MG PO CAPS: 500.0000 mg | ORAL_CAPSULE | Freq: Four times a day (QID) | ORAL | 0 refills | Status: DC

## 2019-01-28 NOTE — ED Triage Notes (Signed)
Pt presents with complaints of left eye swelling that started yesterday. Unknown what reaction could be to. Patient states that it is painful under the middle of her eye.

## 2019-01-28 NOTE — ED Notes (Signed)
Patient able to ambulate independently  

## 2019-01-28 NOTE — ED Provider Notes (Signed)
[place]    CSN: [id number] Arrival date & time: 01/28/19  1136      History   Chief Complaint Chief Complaint  Patient presents with  . Facial Swelling    HPI Lynn Martinez is a 48 y.o. female.   Pt complains of irration to her left eye.  Pt reports eyelid is swollen.  Pt worried about infection.  Pt has Graves disease   The history is provided by the patient. No language interpreter was used.  Eye Problem Location:  Left eye Timing:  Constant Progression:  Worsening Chronicity:  New Relieved by:  Nothing Worsened by:  Nothing Associated symptoms: inflammation     Past Medical History:  Diagnosis Date  . Anemia   . Anxiety   . Arthritis    "knees" (09/30/2015)  . Bipolar disorder ([place])   . Depression   . Diabetes mellitus without complication ([place])   . Fibroid    s/p myomectomy 2010, hysterectomy 2013  . Grave's disease   . Heart murmur   . Hormone disorder   . Hypertension   . Hypothyroidism   . 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 Migraine     otc med prn  . PCOS (polycystic ovarian syndrome)   . PMS (premenstrual syndrome)   . PTSD (post-traumatic stress disorder)   . Seasonal allergies   . Thyroid cancer ([place]) 2005  . Thyroid disease   . Type II diabetes mellitus ([place])   . Vertigo     Patient Active Problem List   Diagnosis Date Noted  . Radiculopathy, cervical region 11/21/2018  . Type 2 diabetes mellitus without complication, with long-term current use of insulin ([place]) 10/31/2018  . Carpal tunnel syndrome, right upper limb 10/08/2018  . Carpal tunnel syndrome, left upper limb 10/08/2018  . Pain in the chest 09/30/2015  . Hypertension 09/30/2015  . Type 2 diabetes mellitus with hyperglycemia, with long-term current use of insulin ([place]) 09/30/2015  . Chest pain 09/30/2015  . Cholelithiasis with acute cholecystitis 09/05/2013  . Borderline behavior 03/14/2012    Class: Chronic  . OCD  (obsessive compulsive disorder) 03/14/2012    Class: Chronic  . PTSD (post-traumatic stress disorder) 03/14/2012    Class: Chronic  . 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 Lumbago without sciatica 03/12/2012  . Major depression, recurrent ([place]) 03/08/2012    Class: Chronic  . Snoring 12/02/2011  . PCOS (polycystic ovarian syndrome) 09/05/2011  . Hypothyroidism 08/13/2010  . VITAMIN D DEFICIENCY 08/13/2010  . ANEMIA-NOS 08/13/2010    Past Surgical History:  Procedure Laterality Date  . ABDOMINAL HYSTERECTOMY  10/11/2011   Procedure: HYSTERECTOMY ABDOMINAL;  Surgeon: Terrance Mass, MD;  Location: [facility];  Service: Gynecology;  Laterality: N/A;  With Repair of serosa.  . ABDOMINAL HYSTERECTOMY    . CHOLECYSTECTOMY N/A 09/06/2013   Procedure: LAPAROSCOPIC CHOLECYSTECTOMY WITH INTRAOPERATIVE CHOLANGIOGRAM;  Surgeon: Merrie Roof, MD;  Location: [facility];  Service: General;  Laterality: N/A;  . DIAGNOSTIC LAPAROSCOPY    . DILATION AND CURETTAGE OF UTERUS    . HERNIA REPAIR    . MYOMECTOMY  2010   LAPAROSCOPY  . thyroid removed    . TOTAL THYROIDECTOMY  2006  . UMBILICAL HERNIA REPAIR  1982    OB History    Gravida  2   Para  0   Term  0   Preterm  0  AB  2   Living        SAB  2   TAB  0   Ectopic  0   Multiple      Live Births               Home Medications    Prior to Admission medications   Medication Sig Start Date End Date Taking? Authorizing Provider  atorvastatin (LIPITOR) 10 MG tablet Take 1 tablet (10 mg total) by mouth daily. 05/09/18   Fulp, Cammie, MD  cephALEXin (KEFLEX) 500 MG capsule Take 1 capsule (500 mg total) by mouth 4 (four) times daily. 01/28/19   Fransico Meadow, PA-C  cholecalciferol (VITAMIN D) 1000 UNITS tablet Take 2,000 Units by mouth daily.  03/14/12   Darrol Jump, MD  Continuous Blood Gluc Receiver (FREESTYLE LIBRE 14 DAY READER) DEVI 1 kit by Does not apply route 4 (four) times daily.  06/01/18   Shamleffer, Melanie Crazier, MD  Continuous Blood Gluc Sensor (FREESTYLE LIBRE 14 DAY SENSOR) MISC 1 kit by Does not apply route 4 (four) times daily. 06/01/18   Shamleffer, Melanie Crazier, MD  diclofenac sodium (VOLTAREN) 1 % GEL Apply up to 4 times daily to painful joint areas 10/03/18   Fulp, Cammie, MD  docusate sodium (COLACE) 100 MG capsule Take 2 capsules (200 mg total) by mouth every morning. For stool softner Patient taking differently: Take 100 mg by mouth at bedtime. For stool softner 03/14/12   Darrol Jump, MD  ferrous sulfate 325 (65 FE) MG tablet Take 1 tablet (325 mg total) by mouth daily with breakfast. For low iron Patient taking differently: Take 65 mg by mouth 2 (two) times daily with a meal. 2 tablets For low iron 03/14/12   Darrol Jump, MD  gabapentin (NEURONTIN) 300 MG capsule Take one cap qd x 1 day, then one cap bix x 1 day and then one cap tid thereafter 11/21/18   Aundra Dubin, PA-C  hydrOXYzine (ATARAX/VISTARIL) 25 MG tablet Take 1 tablet (25 mg total) by mouth 2 (two) times daily. 04/13/18   Fulp, Cammie, MD  insulin aspart (NOVOLOG FLEXPEN) 100 UNIT/ML FlexPen Inject 22 Units into the skin 3 (three) times daily with meals. 08/13/18   Shamleffer, Melanie Crazier, MD  Insulin Glargine (LANTUS SOLOSTAR) 100 UNIT/ML Solostar Pen Inject 50 Units into the skin at bedtime. 10/31/18   Shamleffer, Melanie Crazier, MD  Insulin Pen Needle (TRUEPLUS PEN NEEDLES) 32G X 4 MM MISC Use as instructed to inject insulin. 04/25/18   Fulp, Ander Gaster, MD  levothyroxine (SYNTHROID) 125 MCG tablet Take 1 tablet (125 mcg total) by mouth daily before breakfast. 10/03/18   Fulp, Cammie, MD  lithium carbonate 300 MG capsule Take 600 mg by mouth at bedtime.     [provider]  lurasidone (LATUDA) 80 MG TABS tablet Take 80 mg by mouth at bedtime.    [provider]  methocarbamol (ROBAXIN) 500 MG tablet Take 1 tablet (500 mg total) by mouth 3 (three) times daily as needed  for muscle spasms. 07/04/18   Fulp, Ander Gaster, MD  methylPREDNISolone (MEDROL DOSEPAK) 4 MG TBPK tablet Take as directed 11/21/18   Aundra Dubin, PA-C  nystatin cream (MYCOSTATIN) Apply 1 application topically 2 (two) times daily. To affected skin areas 10/03/18   Fulp, Cammie, MD  pioglitazone (ACTOS) 30 MG tablet Take 1 tablet (30 mg total) by mouth daily. 06/20/18   Shamleffer, Melanie Crazier, MD  prazosin (MINIPRESS) 1 MG  capsule Take 1 capsule (1 mg total) by mouth at bedtime and may repeat dose one time if needed. For sleep/nightmares 03/14/12   Darrol Jump, MD  propranolol (INDERAL) 40 MG tablet Take 1 tablet (40 mg total) by mouth 2 (two) times daily. 10/03/18   Fulp, Cammie, MD  tobramycin (TOBREX) 0.3 % ophthalmic solution Place 1 drop into the right eye every 4 (four) hours for 10 days. 01/28/19 02/07/19  Fransico Meadow, PA-C  tolterodine (DETROL LA) 4 MG 24 hr capsule Take 1 capsule (4 mg total) by mouth daily. 04/13/18   Fulp, Cammie, MD  traMADol (ULTRAM) 50 MG tablet Take 1 tablet (50 mg total) by mouth every 6 (six) hours as needed. 10/08/18   Aundra Dubin, PA-C    Family History Family History  Problem Relation Age of Onset  . Thyroid disease Mother   . Diabetes Father   . Hypertension Father   . Heart disease Father   . Heart attack Father   . Cancer Maternal Aunt        BRAIN  . Cancer Maternal Grandmother        LYMPHOMA  . Hypertension Maternal Grandmother   . Cancer Paternal Grandmother        PANCREATIC  . Diabetes Paternal Grandmother   . Hypertension Paternal Grandmother   . Hypertension Maternal Grandfather   . Hypertension Paternal Grandfather     Social History Social History   Tobacco Use  . Smoking status: Never Smoker  . Smokeless tobacco: Never Used  Substance Use Topics  . Alcohol use: Never    Frequency: Never  . Drug use: Never     Allergies   Bee venom, Codeine, Ibuprofen, Ibuprofen, Metformin and related, Morphine, Penicillins, and  Aspirin   Review of Systems Review of Systems  All other systems reviewed and are negative.    Physical Exam Triage Vital Signs ED Triage Vitals  Enc Vitals Group     BP 01/28/19 1235 (!) 144/90     Pulse Rate 01/28/19 1235 80     Resp 01/28/19 1235 17     Temp 01/28/19 1235 98.5 F (36.9 C)     Temp Source 01/28/19 1235 Oral     SpO2 01/28/19 1235 99 %     Weight --      Height --      Head Circumference --      Peak Flow --      Pain Score 01/28/19 1240 8     Pain Loc --      Pain Edu? --      Excl. in [place]? --    No data found.  Updated Vital Signs BP (!) 144/90 (BP Location: Right Arm)   Pulse 80   Temp 98.5 F (36.9 C) (Oral)   Resp 17   LMP 09/20/2011   SpO2 99%   Visual Acuity Right Eye Distance:   Left Eye Distance:   Bilateral Distance:    Right Eye Near:   Left Eye Near:    Bilateral Near:     Physical Exam Vitals signs reviewed.  HENT:     Mouth/Throat:     Mouth: Mucous membranes are moist.  Eyes:     Pupils: Pupils are equal, round, and reactive to light.     Comments: Swollen left eyelid  Neck:     Musculoskeletal: Normal range of motion.  Cardiovascular:     Rate and Rhythm: Normal rate.     Pulses:  Normal pulses.  Pulmonary:     Effort: Pulmonary effort is normal.  Abdominal:     General: Abdomen is flat.  Skin:    General: Skin is warm.  Neurological:     General: No focal deficit present.     Mental Status: She is alert.  Psychiatric:        Mood and Affect: Mood normal.      UC Treatments / Results  Labs (all labs ordered are listed, but only abnormal results are displayed) Labs Reviewed - No data to display  EKG   Radiology No results found.  Procedures Procedures (including critical care time)  Medications Ordered in UC Medications - No data to display  Initial Impression / Assessment and Plan / UC Course  I have reviewed the triage vital signs and the nursing notes.  Pertinent labs & imaging results  that were available during my care of the patient were reviewed by me and considered in my medical decision making (see chart for details).    Pt advised to follow up with opthomology if symptoms worsen /don't resolve  Final Clinical Impressions(s) / UC Diagnoses   Final diagnoses:  Acute conjunctivitis of left eye, unspecified acute conjunctivitis type   Discharge Instructions   None    ED Prescriptions    Medication Sig Dispense Auth. Provider   tobramycin (TOBREX) 0.3 % ophthalmic solution Place 1 drop into the right eye every 4 (four) hours for 10 days. 3 mL Emmilia Sowder K, PA-C   cephALEXin (KEFLEX) 500 MG capsule Take 1 capsule (500 mg total) by mouth 4 (four) times daily. 28 capsule Fransico Meadow, [place]     Controlled Substance Prescriptions [place] Controlled Substance Registry consulted? Not Applicable   Fransico Meadow, [place] 01/28/19 1541

## 2019-01-29 ENCOUNTER — Other Ambulatory Visit: Payer: Self-pay

## 2019-01-30 ENCOUNTER — Ambulatory Visit: Payer: BLUE CROSS/BLUE SHIELD | Admitting: Internal Medicine

## 2019-01-30 NOTE — Progress Notes (Deleted)
Name: Lynn Martinez  Age/ Sex: 48 y.o., female   MRN/ DOB: [id number], Aug 03, 1970     PCP: Antony Blackbird, MD   Reason for Endocrinology Evaluation: Type 2 Diabetes Mellitus  Initial Endocrine Consultative Visit: 12/20    PATIENT IDENTIFIER: Lynn Martinez is a 49 y.o. female with a past medical history of T2DM, hypothyroidism, bipolar disorder and HTN . The patient has followed with Endocrinology clinic since 06/01/18 for consultative assistance with management of her diabetes.  DIABETIC HISTORY:  Lynn Martinez was diagnosed with T2DM ~ 2 yrs ago, and started insulin therapy shortly after the diagnosis. She has tried Glipizide and Januvia in the past.  Her hemoglobin A1c has ranged from 9.1%  in 2017, peaking at 11.1% in 2019.   SUBJECTIVE:   During the last visit (10/31/2018): A1c 6.4%. Decreased Lantus and continued novolog   Today (01/30/2019): Lynn Martinez is here for a 3 month  follow up on her diabetes management. She checks her blood sugars 3 times daily, preprandial.  The patient has not had hypoglycemic episodes since the last clinic visit, but has noted tight BG's. Otherwise, the patient has not required any recent emergency interventions for hypoglycemia and has not had recent hospitalizations secondary to hyper or hypoglycemic episodes.   She denies any side effects to pioglitazone, she has stopped drinking all sugar sweetened beverages.   ROS: As per HPI and as detailed below: Review of Systems  Constitutional: Negative for fever and weight loss.  Eyes: Negative for blurred vision and pain.  Respiratory: Negative for cough and shortness of breath.   Cardiovascular: Negative for chest pain and palpitations.  Gastrointestinal: Negative for diarrhea and nausea.  Neurological: Positive for headaches.      HOME DIABETES REGIMEN:  Lantus 50 units novolog 22 units QAC Actos 30 mg daily    GLUCOSE LOG: 77-197 mg/dL      HISTORY:  Past  Medical History:  Past Medical History:  Diagnosis Date  . Anemia   . Anxiety   . Arthritis    "knees" (09/30/2015)  . Bipolar disorder ([place])   . Depression   . Diabetes mellitus without complication ([place])   . Fibroid    s/p myomectomy 2010, hysterectomy 2013  . Grave's disease   . Heart murmur   . Hormone disorder   . Hypertension   . Hypothyroidism   . 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 Migraine     otc med prn  . PCOS (polycystic ovarian syndrome)   . PMS (premenstrual syndrome)   . PTSD (post-traumatic stress disorder)   . Seasonal allergies   . Thyroid cancer ([place]) 2005  . Thyroid disease   . Type II diabetes mellitus ([place])   . Vertigo    Past Surgical History:  Past Surgical History:  Procedure Laterality Date  . ABDOMINAL HYSTERECTOMY  10/11/2011   Procedure: HYSTERECTOMY ABDOMINAL;  Surgeon: Terrance Mass, MD;  Location: [facility];  Service: Gynecology;  Laterality: N/A;  With Repair of serosa.  . ABDOMINAL HYSTERECTOMY    . CHOLECYSTECTOMY N/A 09/06/2013   Procedure: LAPAROSCOPIC CHOLECYSTECTOMY WITH INTRAOPERATIVE CHOLANGIOGRAM;  Surgeon: Merrie Roof, MD;  Location: [facility];  Service: General;  Laterality: N/A;  . DIAGNOSTIC LAPAROSCOPY    . DILATION AND CURETTAGE OF UTERUS    . HERNIA REPAIR    . MYOMECTOMY  2010   LAPAROSCOPY  . thyroid removed    . TOTAL  THYROIDECTOMY  2006  . UMBILICAL HERNIA REPAIR  1982    Social History:  reports that she has never smoked. She has never used smokeless tobacco. She reports that she does not drink alcohol or use drugs. Family History:  Family History  Problem Relation Age of Onset  . Thyroid disease Mother   . Diabetes Father   . Hypertension Father   . Heart disease Father   . Heart attack Father   . Cancer Maternal Aunt        BRAIN  . Cancer Maternal Grandmother        LYMPHOMA  . Hypertension Maternal Grandmother   . Cancer Paternal Grandmother        PANCREATIC  .  Diabetes Paternal Grandmother   . Hypertension Paternal Grandmother   . Hypertension Maternal Grandfather   . Hypertension Paternal Grandfather      HOME MEDICATIONS: Allergies as of 01/30/2019      Reactions   Bee Venom    Codeine Itching   Tolerates Hydrocodone OK.   Ibuprofen Hives, Other (See Comments)   Happened in childhood   Ibuprofen Hives, Swelling   Metformin And Related Other (See Comments)   Muscle weakness   Morphine Itching, Other (See Comments)   Throat swelling   Penicillins    Childhood Allergy Has patient had a PCN reaction causing immediate rash, facial/tongue/throat swelling, SOB or lightheadedness with hypotension: Unknown Has patient had a PCN reaction causing severe rash involving mucus membranes or skin necrosis: Unknown Has patient had a PCN reaction that required hospitalization: Unknown Has patient had a PCN reaction occurring within the last 10 years: Unknown If all of the above answers are "NO", then may proceed with Cephalosporin use.   Aspirin Hives, Swelling, Rash      Medication List       Accurate as of January 30, 2019  7:40 AM. If you have any questions, ask your nurse or doctor.        atorvastatin 10 MG tablet Commonly known as: LIPITOR Take 1 tablet (10 mg total) by mouth daily.   cephALEXin 500 MG capsule Commonly known as: KEFLEX Take 1 capsule (500 mg total) by mouth 4 (four) times daily.   cholecalciferol 1000 units tablet Commonly known as: VITAMIN D Take 2,000 Units by mouth daily.   diclofenac sodium 1 % Gel Commonly known as: VOLTAREN Apply up to 4 times daily to painful joint areas   docusate sodium 100 MG capsule Commonly known as: COLACE Take 2 capsules (200 mg total) by mouth every morning. For stool softner What changed:   how much to take  when to take this   ferrous sulfate 325 (65 FE) MG tablet Take 1 tablet (325 mg total) by mouth daily with breakfast. For low iron What changed:   how much to take   when to take this  additional instructions   FreeStyle Libre 14 Day Reader Kerrin Mo 1 kit by Does not apply route 4 (four) times daily.   FreeStyle Libre 14 Day Sensor Misc 1 kit by Does not apply route 4 (four) times daily.   gabapentin 300 MG capsule Commonly known as: Neurontin Take one cap qd x 1 day, then one cap bix x 1 day and then one cap tid thereafter   hydrOXYzine 25 MG tablet Commonly known as: ATARAX/VISTARIL Take 1 tablet (25 mg total) by mouth 2 (two) times daily.   insulin aspart 100 UNIT/ML FlexPen Commonly known as: NovoLOG FlexPen Inject 22 Units  into the skin 3 (three) times daily with meals.   Insulin Glargine 100 UNIT/ML Solostar Pen Commonly known as: Lantus SoloStar Inject 50 Units into the skin at bedtime.   Insulin Pen Needle 32G X 4 MM Misc Commonly known as: TRUEplus Pen Needles Use as instructed to inject insulin.   levothyroxine 125 MCG tablet Commonly known as: SYNTHROID Take 1 tablet (125 mcg total) by mouth daily before breakfast.   lithium carbonate 300 MG capsule Take 600 mg by mouth at bedtime.   lurasidone 80 MG Tabs tablet Commonly known as: LATUDA Take 80 mg by mouth at bedtime.   methocarbamol 500 MG tablet Commonly known as: Robaxin Take 1 tablet (500 mg total) by mouth 3 (three) times daily as needed for muscle spasms.   methylPREDNISolone 4 MG Tbpk tablet Commonly known as: MEDROL DOSEPAK Take as directed   nystatin cream Commonly known as: MYCOSTATIN Apply 1 application topically 2 (two) times daily. To affected skin areas   pioglitazone 30 MG tablet Commonly known as: Actos Take 1 tablet (30 mg total) by mouth daily.   prazosin 1 MG capsule Commonly known as: MINIPRESS Take 1 capsule (1 mg total) by mouth at bedtime and may repeat dose one time if needed. For sleep/nightmares   propranolol 40 MG tablet Commonly known as: INDERAL Take 1 tablet (40 mg total) by mouth 2 (two) times daily.   tobramycin 0.3 %  ophthalmic solution Commonly known as: Tobrex Place 1 drop into the right eye every 4 (four) hours for 10 days.   tolterodine 4 MG 24 hr capsule Commonly known as: DETROL LA Take 1 capsule (4 mg total) by mouth daily.   traMADol 50 MG tablet Commonly known as: ULTRAM Take 1 tablet (50 mg total) by mouth every 6 (six) hours as needed.        OBJECTIVE:   Vital Signs: LMP 09/20/2011   Wt Readings from Last 3 Encounters:  10/31/18 (!) 301 lb 12.8 oz (136.9 kg)  08/01/18 287 lb (130.2 kg)  07/04/18 293 lb 6.4 oz (133.1 kg)     Exam: General: Pt appears well and is in NAD  Lungs: Clear with good BS bilat with no rales, rhonchi, or wheezes  Heart: RRR with normal S1 and S2 and no gallops; no murmurs; no rub  Abdomen: Normoactive bowel sounds, soft, nontender, without masses or organomegaly palpable  Extremities: No pretibial edema.   Skin: Normal texture and temperature to palpation. No rash noted  Neuro: MS is good with appropriate affect, pt is alert and Ox3   DM foot exam:08/01/18 The skin of the feet is intact without sores or ulcerations. The pedal pulses are 2+ on right and 2+ on left. The sensation is intact to a screening 5.07, 10 gram monofilament bilaterally    DATA REVIEWED: In - Office glucose 108 mg/dL  Lab Results  Component Value Date   HGBA1C 6.4 (A) 10/31/2018   HGBA1C 12.6 (A) 06/20/2018   HGBA1C 11.1 (A) 03/15/2018   Lab Results  Component Value Date   LDLCALC 66 04/11/2018   CREATININE 1.12 (H) 05/23/2018   Results for NIYA, BEHLER (MRN [id number]) as of 01/30/2019 07:41  Ref. Range 04/11/2018 12:24  Microalbumin, Urine Latest Ref Range: Not Estab. ug/mL 9.0  MICROALB/CREAT RATIO Latest Ref Range: 0.0 - 30.0 mg/g creat 5.5  Creatinine, Urine Latest Ref Range: Not Estab. mg/dL 164.8    ASSESSMENT / PLAN / RECOMMENDATIONS:   1) Type 2 Diabetes Mellitus, Optimally Controlled, Without  complications - Most recent A1c of 6.4 %. Goal A1c  < 7.0 %.  Without hypoglycemia   Plan: -Praised the patient on lifestyle changes and optimal glucose control.  - She denies any hypoglycemic episodes. The lowest has been 77 mg/dL and this is noted in the morning, hence will adjust insulin as below.  MEDICATIONS:   Lantus to 50 units   Continue Novolog 22 units   Continue Pioglitazone at 30 mg daily   EDUCATION / INSTRUCTIONS:  BG monitoring instructions: Patient is instructed to check her blood sugars 4 times a day,before meals and bedtime.  Call [facility] if: BG persistently < 70 or > 300. . I reviewed the Rule of 15 for the treatment of hypoglycemia in detail with the patient. Literature supplied.   F/U in 3 months     Signed electronically by: Mack Guise, MD  [facility]  [address] [place], [place] [address] Phone: [phone] FAX: [phone]   CC: Antony Blackbird, MD [place] [address] Phone: [phone]  Fax: [phone]  Return to Endocrinology clinic as below: Future Appointments  Date Time Provider [place]  01/30/2019 10:50 AM Shamleffer, Melanie Crazier, MD [id number] None

## 2019-02-15 ENCOUNTER — Other Ambulatory Visit: Payer: Self-pay

## 2019-02-20 ENCOUNTER — Ambulatory Visit: Payer: BC Managed Care – PPO | Admitting: Internal Medicine

## 2019-02-26 ENCOUNTER — Other Ambulatory Visit: Payer: Self-pay

## 2019-02-27 ENCOUNTER — Ambulatory Visit: Payer: Self-pay | Admitting: Internal Medicine

## 2019-02-28 ENCOUNTER — Ambulatory Visit: Payer: Self-pay | Attending: Family Medicine | Admitting: Pharmacist

## 2019-02-28 ENCOUNTER — Other Ambulatory Visit: Payer: Self-pay

## 2019-02-28 ENCOUNTER — Ambulatory Visit: Payer: Self-pay | Attending: Family Medicine | Admitting: Family Medicine

## 2019-02-28 ENCOUNTER — Encounter: Payer: Self-pay | Admitting: Family Medicine

## 2019-02-28 VITALS — BP 121/82 | HR 66 | Temp 99.0°F | Resp 16 | Wt 273.8 lb

## 2019-02-28 DIAGNOSIS — G8929 Other chronic pain: Secondary | ICD-10-CM

## 2019-02-28 DIAGNOSIS — I10 Essential (primary) hypertension: Secondary | ICD-10-CM

## 2019-02-28 DIAGNOSIS — Z23 Encounter for immunization: Secondary | ICD-10-CM

## 2019-02-28 DIAGNOSIS — M25561 Pain in right knee: Secondary | ICD-10-CM

## 2019-02-28 DIAGNOSIS — E119 Type 2 diabetes mellitus without complications: Secondary | ICD-10-CM

## 2019-02-28 DIAGNOSIS — Z794 Long term (current) use of insulin: Secondary | ICD-10-CM

## 2019-02-28 DIAGNOSIS — E890 Postprocedural hypothyroidism: Secondary | ICD-10-CM

## 2019-02-28 DIAGNOSIS — Z79899 Other long term (current) drug therapy: Secondary | ICD-10-CM

## 2019-02-28 DIAGNOSIS — R32 Unspecified urinary incontinence: Secondary | ICD-10-CM

## 2019-02-28 DIAGNOSIS — M25562 Pain in left knee: Secondary | ICD-10-CM

## 2019-02-28 LAB — GLUCOSE, POCT (MANUAL RESULT ENTRY): POC Glucose: 369 mg/dL — AB (ref 70–99)

## 2019-02-28 MED ORDER — TOLTERODINE TARTRATE ER 4 MG PO CP24: 4.0000 mg | ORAL_CAPSULE | Freq: Every day | ORAL | 3 refills | Status: DC

## 2019-02-28 MED ORDER — LEVOTHYROXINE SODIUM 125 MCG PO TABS: 125.0000 ug | ORAL_TABLET | Freq: Every day | ORAL | 1 refills | Status: DC

## 2019-02-28 NOTE — Progress Notes (Signed)
Patient presents for vaccination against influenza per orders of Dr. Fulp. Consent given. Counseling provided. No contraindications exists. Vaccine administered without incident.   

## 2019-02-28 NOTE — Patient Instructions (Signed)
Influenza Virus Vaccine injection (Fluarix) What is this medicine? INFLUENZA VIRUS VACCINE (in floo EN zuh VAHY ruhs vak SEEN) helps to reduce the risk of getting influenza also known as the flu. This medicine may be used for other purposes; ask your health care provider or pharmacist if you have questions. COMMON BRAND NAME(S): Fluarix, Fluzone What should I tell my health care provider before I take this medicine? They need to know if you have any of these conditions:  bleeding disorder like hemophilia  fever or infection  Guillain-Barre syndrome or other neurological problems  immune system problems  infection with the human immunodeficiency virus (HIV) or AIDS  low blood platelet counts  multiple sclerosis  an unusual or allergic reaction to influenza virus vaccine, eggs, chicken proteins, latex, gentamicin, other medicines, foods, dyes or preservatives  pregnant or trying to get pregnant  breast-feeding How should I use this medicine? This vaccine is for injection into a muscle. It is given by a health care professional. A copy of Vaccine Information Statements will be given before each vaccination. Read this sheet carefully each time. The sheet may change frequently. Talk to your pediatrician regarding the use of this medicine in children. Special care may be needed. Overdosage: If you think you have taken too much of this medicine contact a poison control center or emergency room at once. NOTE: This medicine is only for you. Do not share this medicine with others. What if I miss a dose? This does not apply. What may interact with this medicine?  chemotherapy or radiation therapy  medicines that lower your immune system like etanercept, anakinra, infliximab, and adalimumab  medicines that treat or prevent blood clots like warfarin  phenytoin  steroid medicines like prednisone or cortisone  theophylline  vaccines This list may not describe all possible  interactions. Give your health care provider a list of all the medicines, herbs, non-prescription drugs, or dietary supplements you use. Also tell them if you smoke, drink alcohol, or use illegal drugs. Some items may interact with your medicine. What should I watch for while using this medicine? Report any side effects that do not go away within 3 days to your doctor or health care professional. Call your health care provider if any unusual symptoms occur within 6 weeks of receiving this vaccine. You may still catch the flu, but the illness is not usually as bad. You cannot get the flu from the vaccine. The vaccine will not protect against colds or other illnesses that may cause fever. The vaccine is needed every year. What side effects may I notice from receiving this medicine? Side effects that you should report to your doctor or health care professional as soon as possible:  allergic reactions like skin rash, itching or hives, swelling of the face, lips, or tongue Side effects that usually do not require medical attention (report to your doctor or health care professional if they continue or are bothersome):  fever  headache  muscle aches and pains  pain, tenderness, redness, or swelling at site where injected  weak or tired This list may not describe all possible side effects. Call your doctor for medical advice about side effects. You may report side effects to FDA at 1-800-FDA-1088. Where should I keep my medicine? This vaccine is only given in a clinic, pharmacy, doctor's office, or other health care setting and will not be stored at home. NOTE: This sheet is a summary. It may not cover all possible information. If you have questions   about this medicine, talk to your doctor, pharmacist, or health care provider.  2020 Elsevier/Gold Standard (2007-12-26 09:30:40)  

## 2019-02-28 NOTE — Progress Notes (Signed)
Established Patient Office Visit  Subjective:  Patient ID: Lynn Martinez, female    DOB: 01-01-1971  Age: 48 y.o. MRN: [id number]  CC:  Chief Complaint  Patient presents with   Hypertension   Diabetes    HPI Lynn Martinez presents for follow-up of chronic medical issues including type 2 diabetes, hypertension, hypothyroidism and patient with complaint of continued knee pain.  She also has an order for lab work that her psychiatrist needs to have done and patient states that she discussed with her psychiatrist that says she had a visit here she was see if her lab work could be done and then a copy of the labs forwarded to her psychiatrist.        Patient reports that her blood sugar control has not been quite as good as it was in the past.  She states that due to the COVID-19 pandemic she lost her job and since she is at home all day she tends to eat more than she did in the past including snack foods.  She does have an upcoming appointment with endocrinology but is not sure if the endocrinologist will see her as she now no longer has insurance.  She denies any increased thirst or urinary frequency related to her diabetes.  She does have issues with urinary incontinence as sometimes she cannot get to the restroom quick enough.  She is also having issues with knee pain and reports the knee pain is increased if she has been setting and then stands up to walk.  She also has increased knee pain going up and down stairs.  Knee pain is about a 6-7 on a 0-to-10 scale.  She continues to take her thyroid medication.  She denies any symptoms of hypo or hyperthyroidism other than some fatigue.  She denies peripheral edema, no constipation, no increased heart rate, no unexplained weight loss or weight gain.  Past Medical History:  Diagnosis Date   Anemia    Anxiety    Arthritis    "knees" (09/30/2015)   Bipolar disorder ([place])    Depression    Diabetes mellitus without  complication ([place])    Fibroid    s/p myomectomy 2010, hysterectomy 2013   Grave's disease    Heart murmur    Hormone disorder    Hypertension    Hypothyroidism    Memory loss    "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   Migraine     otc med prn   PCOS (polycystic ovarian syndrome)    PMS (premenstrual syndrome)    PTSD (post-traumatic stress disorder)    Seasonal allergies    Thyroid cancer ([place]) 2005   Thyroid disease    Type II diabetes mellitus ([place])    Vertigo     Past Surgical History:  Procedure Laterality Date   ABDOMINAL HYSTERECTOMY  10/11/2011   Procedure: HYSTERECTOMY ABDOMINAL;  Surgeon: Terrance Mass, MD;  Location: [facility];  Service: Gynecology;  Laterality: N/A;  With Repair of serosa.   ABDOMINAL HYSTERECTOMY     CHOLECYSTECTOMY N/A 09/06/2013   Procedure: LAPAROSCOPIC CHOLECYSTECTOMY WITH INTRAOPERATIVE CHOLANGIOGRAM;  Surgeon: Merrie Roof, MD;  Location: [facility];  Service: General;  Laterality: N/A;   DIAGNOSTIC LAPAROSCOPY     DILATION AND CURETTAGE OF UTERUS     HERNIA REPAIR     MYOMECTOMY  2010   LAPAROSCOPY   thyroid removed     TOTAL THYROIDECTOMY  4259   UMBILICAL HERNIA  REPAIR  1982    Family History  Problem Relation Age of Onset   Thyroid disease Mother    Diabetes Father    Hypertension Father    Heart disease Father    Heart attack Father    Cancer Maternal Aunt        BRAIN   Cancer Maternal Grandmother        LYMPHOMA   Hypertension Maternal Grandmother    Cancer Paternal Grandmother        PANCREATIC   Diabetes Paternal Grandmother    Hypertension Paternal Grandmother    Hypertension Maternal Grandfather    Hypertension Paternal Grandfather     Social History   Socioeconomic History   Marital status: Single    Spouse name: Not on file   Number of children: Not on file   Years of education: Not on file   Highest education level: Not on file  Occupational History    Not on file  Social Needs   Financial resource strain: Not on file   Food insecurity    Worry: Not on file    Inability: Not on file   Transportation needs    Medical: Not on file    Non-medical: Not on file  Tobacco Use   Smoking status: Never Smoker   Smokeless tobacco: Never Used  Substance and Sexual Activity   Alcohol use: Never    Frequency: Never   Drug use: Never   Sexual activity: Yes    Birth control/protection: Condom, Coitus interruptus, Surgical  Lifestyle   Physical activity    Days per week: Not on file    Minutes per session: Not on file   Stress: Not on file  Relationships   Social connections    Talks on phone: Not on file    Gets together: Not on file    Attends religious service: Not on file    Active member of club or organization: Not on file    Attends meetings of clubs or organizations: Not on file    Relationship status: Not on file   Intimate partner violence    Fear of current or ex partner: Not on file    Emotionally abused: Not on file    Physically abused: Not on file    Forced sexual activity: Not on file  Other Topics Concern   Not on file  Social History Narrative   ** Merged History Encounter **        Outpatient Medications Prior to Visit  Medication Sig Dispense Refill   atorvastatin (LIPITOR) 10 MG tablet Take 1 tablet (10 mg total) by mouth daily. 90 tablet 0   cephALEXin (KEFLEX) 500 MG capsule Take 1 capsule (500 mg total) by mouth 4 (four) times daily. 28 capsule 0   cholecalciferol (VITAMIN D) 1000 UNITS tablet Take 2,000 Units by mouth daily.      Continuous Blood Gluc Receiver (FREESTYLE LIBRE 14 DAY READER) DEVI 1 kit by Does not apply route 4 (four) times daily. 1 Device 0   Continuous Blood Gluc Sensor (FREESTYLE LIBRE 14 DAY SENSOR) MISC 1 kit by Does not apply route 4 (four) times daily. 2 each 6   diclofenac sodium (VOLTAREN) 1 % GEL Apply up to 4 times daily to painful joint areas 1 Tube 6    docusate sodium (COLACE) 100 MG capsule Take 2 capsules (200 mg total) by mouth every morning. For stool softner (Patient taking differently: Take 100 mg by mouth at bedtime. For stool  softner) 10 capsule    ferrous sulfate 325 (65 FE) MG tablet Take 1 tablet (325 mg total) by mouth daily with breakfast. For low iron (Patient taking differently: Take 65 mg by mouth 2 (two) times daily with a meal. 2 tablets For low iron)     gabapentin (NEURONTIN) 300 MG capsule Take one cap qd x 1 day, then one cap bix x 1 day and then one cap tid thereafter 90 capsule 1   hydrOXYzine (ATARAX/VISTARIL) 25 MG tablet Take 1 tablet (25 mg total) by mouth 2 (two) times daily. 60 tablet 2   insulin aspart (NOVOLOG FLEXPEN) 100 UNIT/ML FlexPen Inject 22 Units into the skin 3 (three) times daily with meals. 15 mL 2   Insulin Glargine (LANTUS SOLOSTAR) 100 UNIT/ML Solostar Pen Inject 50 Units into the skin at bedtime. 5 pen 11   Insulin Pen Needle (TRUEPLUS PEN NEEDLES) 32G X 4 MM MISC Use as instructed to inject insulin. 100 each 11   levothyroxine (SYNTHROID) 125 MCG tablet Take 1 tablet (125 mcg total) by mouth daily before breakfast. 90 tablet 0   lithium carbonate 300 MG capsule Take 600 mg by mouth at bedtime.      lurasidone (LATUDA) 80 MG TABS tablet Take 80 mg by mouth at bedtime.     methocarbamol (ROBAXIN) 500 MG tablet Take 1 tablet (500 mg total) by mouth 3 (three) times daily as needed for muscle spasms. 90 tablet 3   methylPREDNISolone (MEDROL DOSEPAK) 4 MG TBPK tablet Take as directed 21 tablet 0   nystatin cream (MYCOSTATIN) Apply 1 application topically 2 (two) times daily. To affected skin areas 30 g 3   pioglitazone (ACTOS) 30 MG tablet Take 1 tablet (30 mg total) by mouth daily. 30 tablet 6   prazosin (MINIPRESS) 1 MG capsule Take 1 capsule (1 mg total) by mouth at bedtime and may repeat dose one time if needed. For sleep/nightmares 60 capsule 0   propranolol (INDERAL) 40 MG tablet Take 1  tablet (40 mg total) by mouth 2 (two) times daily. 60 tablet 2   tolterodine (DETROL LA) 4 MG 24 hr capsule Take 1 capsule (4 mg total) by mouth daily. 30 capsule 2   traMADol (ULTRAM) 50 MG tablet Take 1 tablet (50 mg total) by mouth every 6 (six) hours as needed. 30 tablet 0   No facility-administered medications prior to visit.     Allergies  Allergen Reactions   Bee Venom    Codeine Itching    Tolerates Hydrocodone OK.   Ibuprofen Hives and Other (See Comments)    Happened in childhood   Ibuprofen Hives and Swelling   Metformin And Related Other (See Comments)    Muscle weakness   Morphine Itching and Other (See Comments)    Throat swelling   Penicillins     Childhood Allergy Has patient had a PCN reaction causing immediate rash, facial/tongue/throat swelling, SOB or lightheadedness with hypotension: Unknown Has patient had a PCN reaction causing severe rash involving mucus membranes or skin necrosis: Unknown Has patient had a PCN reaction that required hospitalization: Unknown Has patient had a PCN reaction occurring within the last 10 years: Unknown If all of the above answers are "NO", then may proceed with Cephalosporin use.    Aspirin Hives, Swelling and Rash    ROS Review of Systems  Constitutional: Positive for fatigue. Negative for chills and fever.  HENT: Negative for sore throat and trouble swallowing.   Eyes: Negative for photophobia and  visual disturbance.  Respiratory: Negative for cough and shortness of breath.   Cardiovascular: Negative for chest pain, palpitations and leg swelling.  Gastrointestinal: Negative for abdominal distention, blood in stool, constipation, diarrhea and nausea.  Endocrine: Negative for cold intolerance, heat intolerance, polydipsia, polyphagia and polyuria.  Genitourinary: Positive for difficulty urinating (issues with incontinence). Negative for dysuria and frequency.  Musculoskeletal: Positive for arthralgias, back pain  (Occasional low back pain) and gait problem.  Neurological: Negative for dizziness and headaches.  Hematological: Negative for adenopathy. Does not bruise/bleed easily.  Psychiatric/Behavioral: Negative for self-injury and suicidal ideas. The patient is nervous/anxious.       Objective:    Physical Exam  Constitutional: She is oriented to person, place, and time. She appears well-developed and well-nourished.  Well-nourished well-developed overweight for height/obese female in no acute distress.  Patient is wearing face mask as per office COVID-19 precautions  Neck: Normal range of motion. Neck supple. No JVD present.  Cardiovascular: Normal rate and regular rhythm.  Pulmonary/Chest: Effort normal and breath sounds normal.  Abdominal: There is no abdominal tenderness. There is no rebound and no guarding.  Truncal obesity  Musculoskeletal:        General: Tenderness present. No edema.     Comments: Joint line tenderness of the knees  Lymphadenopathy:    She has no cervical adenopathy.  Neurological: She is alert and oriented to person, place, and time.  Skin: Skin is warm and dry.  Psychiatric: She has a normal mood and affect. Her behavior is normal.  Affect is slightly flattened/blunted but patient interacts appropriately  Nursing note and vitals reviewed. Diabetic foot exam-patient with dry skin on the feet as well as calluses on the heels and balls of the feet.  Patient with mild thickening of the toenails.  1+ dorsalis pedis and posterior tibial pulses bilaterally.  Patient with abnormal monofilament sensation on the heels and balls of the feet bilaterally.  BP 121/82    Pulse 66    Temp 99 F (37.2 C) (Oral)    Resp 16    Wt 273 lb 12.8 oz (124.2 kg)    LMP 09/20/2011    SpO2 96%    BMI 39.29 kg/m  Wt Readings from Last 3 Encounters:  02/28/19 273 lb 12.8 oz (124.2 kg)  10/31/18 (!) 301 lb 12.8 oz (136.9 kg)  08/01/18 287 lb (130.2 kg)     Health Maintenance Due  Topic  Date Due   FOOT EXAM  05/03/1983   OPHTHALMOLOGY EXAM  05/03/1983   HIV Screening  05/02/1988   PAP SMEAR-Modifier  09/07/2014   INFLUENZA VACCINE  01/12/2019      Lab Results  Component Value Date   TSH 3.520 05/23/2018   Lab Results  Component Value Date   WBC 10.4 05/06/2018   HGB 12.0 05/06/2018   HCT 39.1 05/06/2018   MCV 82.7 05/06/2018   PLT 434 (H) 05/06/2018   Lab Results  Component Value Date   NA 135 05/23/2018   K 4.6 05/23/2018   CO2 20 05/23/2018   GLUCOSE 404 (H) 05/23/2018   BUN 11 05/23/2018   CREATININE 1.12 (H) 05/23/2018   BILITOT 0.2 05/23/2018   ALKPHOS 96 05/23/2018   AST 16 05/23/2018   ALT 14 05/23/2018   PROT 7.3 05/23/2018   ALBUMIN 4.2 05/23/2018   CALCIUM 9.9 05/23/2018   ANIONGAP 7 05/08/2018   GFR 95.20 08/13/2010   Lab Results  Component Value Date   CHOL 130 04/11/2018  Lab Results  Component Value Date   HDL 40 04/11/2018   Lab Results  Component Value Date   LDLCALC 66 04/11/2018   Lab Results  Component Value Date   TRIG 120 04/11/2018   Lab Results  Component Value Date   CHOLHDL 3.3 04/11/2018   Lab Results  Component Value Date   HGBA1C 6.4 (A) 10/31/2018      Assessment & Plan:  1. Type 2 diabetes mellitus without complication, with long-term current use of insulin (HCC) Patient's glucose was elevated at today's visit at 369 as patient did not take medications prior to her visit as she was fasting to have blood work.  Patient is encouraged to keep her upcoming follow-up appointment with endocrinology.  She is also instructed to take her diabetes medications as soon as she gets home.  Patient will have a lipid panel, comprehensive metabolic panel and hemoglobin A1c at today's visit.  She is encouraged to resume a low carbohydrate diet as well as exercise to help control her blood sugars   - POCT glucose (manual entry) - Lipid panel - Comprehensive metabolic panel - HgB X7W  2. Essential  hypertension Blood pressure is controlled and below goal of 130/80.  Patient is currently on Inderal but as she is diabetic, she would also likely benefit from low-dose ACE inhibitor therapy  3. Postoperative hypothyroidism TSH will be done at today's visit in follow-up of her hypothyroidism and she will be notified of the changes needed in the dose of her thyroid medication based on today's blood work - TSH  4. Encounter for long-term (current) use of medications Comprehensive metabolic panel and CBC will be done at today's visit in follow-up of long-term use of medications for the treatment of diabetes, hypertension and hyperlipidemia in addition to patient's use of medications for anxiety and depression.  Copies of today's labs will also be shared with patient psychiatrist. - Comprehensive metabolic panel - CBC with Differential  5. Chronic pain of both knees Continue use of Voltaren gel up to 4 times daily as needed for knee pain.  Continue to remain active and engage in efforts at weight loss  6. Urinary incontinence, unspecified type Prescription provided for patient to try the use of Detrol as she states that she did take similar medicine in the past.  Suggested UA to look for possible urinary tract infection but patient did not feel she could give urine sample at today's visit.   Needs labs also sent to her psychiatrist, Ivin Poot at [facility] Fax [phone]  Follow-up: Return in about 4 months (around 06/30/2019) for chronic issues.    Antony Blackbird, MD

## 2019-02-28 NOTE — Progress Notes (Signed)
Pt states she is having pain in b/l knees, lower back and head

## 2019-03-01 LAB — COMPREHENSIVE METABOLIC PANEL WITH GFR
ALT: 26 IU/L (ref 0–32)
AST: 13 IU/L (ref 0–40)
Albumin/Globulin Ratio: 1.5 (ref 1.2–2.2)
Albumin: 4.5 g/dL (ref 3.8–4.8)
Alkaline Phosphatase: 91 IU/L (ref 39–117)
BUN/Creatinine Ratio: 8 — ABNORMAL LOW (ref 9–23)
BUN: 7 mg/dL (ref 6–24)
Bilirubin Total: 0.3 mg/dL (ref 0.0–1.2)
CO2: 22 mmol/L (ref 20–29)
Calcium: 10.5 mg/dL — ABNORMAL HIGH (ref 8.7–10.2)
Chloride: 96 mmol/L (ref 96–106)
Creatinine, Ser: 0.86 mg/dL (ref 0.57–1.00)
GFR calc Af Amer: 94 mL/min/1.73
GFR calc non Af Amer: 82 mL/min/1.73
Globulin, Total: 3.1 g/dL (ref 1.5–4.5)
Glucose: 363 mg/dL — ABNORMAL HIGH (ref 65–99)
Potassium: 4.5 mmol/L (ref 3.5–5.2)
Sodium: 134 mmol/L (ref 134–144)
Total Protein: 7.6 g/dL (ref 6.0–8.5)

## 2019-03-01 LAB — CBC WITH DIFFERENTIAL/PLATELET
Basophils Absolute: 0.1 x10E3/uL (ref 0.0–0.2)
Basos: 1 %
EOS (ABSOLUTE): 0.3 x10E3/uL (ref 0.0–0.4)
Eos: 3 %
Hematocrit: 43.8 % (ref 34.0–46.6)
Hemoglobin: 14.4 g/dL (ref 11.1–15.9)
Immature Grans (Abs): 0 x10E3/uL (ref 0.0–0.1)
Immature Granulocytes: 0 %
Lymphocytes Absolute: 2.9 x10E3/uL (ref 0.7–3.1)
Lymphs: 33 %
MCH: 26.6 pg (ref 26.6–33.0)
MCHC: 32.9 g/dL (ref 31.5–35.7)
MCV: 81 fL (ref 79–97)
Monocytes Absolute: 0.6 x10E3/uL (ref 0.1–0.9)
Monocytes: 6 %
Neutrophils Absolute: 4.9 x10E3/uL (ref 1.4–7.0)
Neutrophils: 57 %
Platelets: 447 x10E3/uL (ref 150–450)
RBC: 5.41 x10E6/uL — ABNORMAL HIGH (ref 3.77–5.28)
RDW: 14.4 % (ref 11.7–15.4)
WBC: 8.7 x10E3/uL (ref 3.4–10.8)

## 2019-03-01 LAB — LIPID PANEL
Chol/HDL Ratio: 3.8 ratio (ref 0.0–4.4)
Cholesterol, Total: 171 mg/dL (ref 100–199)
HDL: 45 mg/dL
LDL Chol Calc (NIH): 105 mg/dL — ABNORMAL HIGH (ref 0–99)
Triglycerides: 115 mg/dL (ref 0–149)
VLDL Cholesterol Cal: 21 mg/dL (ref 5–40)

## 2019-03-01 LAB — TSH: TSH: 4.83 u[IU]/mL — ABNORMAL HIGH (ref 0.450–4.500)

## 2019-03-04 ENCOUNTER — Other Ambulatory Visit: Payer: Self-pay

## 2019-03-06 ENCOUNTER — Ambulatory Visit (INDEPENDENT_AMBULATORY_CARE_PROVIDER_SITE_OTHER): Payer: Self-pay | Admitting: Internal Medicine

## 2019-03-06 ENCOUNTER — Other Ambulatory Visit: Payer: Self-pay

## 2019-03-06 ENCOUNTER — Encounter: Payer: Self-pay | Admitting: Internal Medicine

## 2019-03-06 VITALS — BP 138/96 | HR 72 | Temp 98.1°F | Ht 70.0 in | Wt 275.8 lb

## 2019-03-06 DIAGNOSIS — Z794 Long term (current) use of insulin: Secondary | ICD-10-CM

## 2019-03-06 DIAGNOSIS — E119 Type 2 diabetes mellitus without complications: Secondary | ICD-10-CM

## 2019-03-06 LAB — GLUCOSE, POCT (MANUAL RESULT ENTRY): POC Glucose: 351 mg/dl — AB (ref 70–99)

## 2019-03-06 LAB — POCT GLYCOSYLATED HEMOGLOBIN (HGB A1C): Hemoglobin A1C: 13.6 % — AB (ref 4.0–5.6)

## 2019-03-06 NOTE — Patient Instructions (Signed)
-    Lantus 50 units daily  - Novolog  22 units with each meal  - Pioglitazone at 30 mg once a day     - Check sugars before each meal and bedtime    HOW TO TREAT LOW BLOOD SUGARS (Blood sugar LESS THAN 70 MG/DL)  Please follow the RULE OF 15 for the treatment of hypoglycemia treatment (when your (blood sugars are less than 70 mg/dL)    STEP 1: Take 15 grams of carbohydrates when your blood sugar is low, which includes:   3-4 GLUCOSE TABS  OR  3-4 OZ OF JUICE OR REGULAR SODA OR  ONE TUBE OF GLUCOSE GEL     STEP 2: RECHECK blood sugar in 15 MINUTES STEP 3: If your blood sugar is still low at the 15 minute recheck --> then, go back to STEP 1 and treat AGAIN with another 15 grams of carbohydrates.

## 2019-03-06 NOTE — Progress Notes (Signed)
 Name: Lynn Martinez  Age/ Sex: 48 y.o., female   MRN/ DOB: [id number], 01/10/1971     PCP: Fulp, Cammie, MD   Reason for Endocrinology Evaluation: Type 2 Diabetes Mellitus  Initial Endocrine Consultative Visit: 12/20    PATIENT IDENTIFIER: Lynn Martinez is a 48 y.o. female with a past medical history of T2DM, hypothyroidism, bipolar disorder and HTN . The patient has followed with Endocrinology clinic since 06/01/18 for consultative assistance with management of her diabetes.  DIABETIC HISTORY:  Ms. Martinez was diagnosed with T2DM ~ 2 yrs ago, and started insulin therapy shortly after the diagnosis. She has tried Glipizide and Januvia in the past.  Her hemoglobin A1c has ranged from 9.1%  in 2017, peaking at 11.1% in 2019.   SUBJECTIVE:   During the last visit (10/31/2018): A1c was 46.4%. Reduced  Basal insulin  And continued novolog and pioglitazone   Today (03/06/2019): Ms. Martinez is here for a 3 month  follow up on her diabetes management. She has not been checking her glucose as much as she lost insurance and has not been able to obtain the freestyle libre. She also has been using 50% less insulin so it will last her longer. Otherwise, the patient has not required any recent emergency interventions for hypoglycemia and has not had recent hospitalizations secondary to hyper or hypoglycemic episodes.   She denies any side effects to pioglitazone, she continues to avoid sugar sweetened beverages.  Fiance had maze heart surgery 8/27th   ROS: As per HPI and as detailed below: Review of Systems  Constitutional: Negative for fever and weight loss.  Eyes: Negative for blurred vision and pain.  Respiratory: Negative for cough and shortness of breath.   Cardiovascular: Negative for chest pain and palpitations.  Gastrointestinal: Negative for diarrhea and nausea.      HOME DIABETES REGIMEN:  Lantus 50 units- taking 25 units  novolog 22 units QAC - 15 units  BID  Actos 30 mg daily     GLUCOSE LOG:did not bring      HISTORY:  Past Medical History:  Past Medical History:  Diagnosis Date  . Anemia   . Anxiety   . Arthritis    "knees" (09/30/2015)  . Bipolar disorder (HCC)   . Depression   . Diabetes mellitus without complication (HCC)   . Fibroid    s/p myomectomy 2010, hysterectomy 2013  . Grave's disease   . Heart murmur   . Hormone disorder   . Hypertension   . Hypothyroidism   . 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 Migraine     otc med prn  . PCOS (polycystic ovarian syndrome)   . PMS (premenstrual syndrome)   . PTSD (post-traumatic stress disorder)   . Seasonal allergies   . Thyroid cancer (HCC) 2005  . Thyroid disease   . Type II diabetes mellitus (HCC)   . Vertigo    Past Surgical History:  Past Surgical History:  Procedure Laterality Date  . ABDOMINAL HYSTERECTOMY  10/11/2011   Procedure: HYSTERECTOMY ABDOMINAL;  Surgeon: Juan H Fernandez, MD;  Location: [facility];  Service: Gynecology;  Laterality: N/A;  With Repair of serosa.  . ABDOMINAL HYSTERECTOMY    . CHOLECYSTECTOMY N/A 09/06/2013   Procedure: LAPAROSCOPIC CHOLECYSTECTOMY WITH INTRAOPERATIVE CHOLANGIOGRAM;  Surgeon: Paul S Toth III, MD;  Location: [facility];  Service: General;  Laterality: N/A;  . DIAGNOSTIC LAPAROSCOPY    . DILATION AND CURETTAGE   OF UTERUS    . HERNIA REPAIR    . MYOMECTOMY  2010   LAPAROSCOPY  . thyroid removed    . TOTAL THYROIDECTOMY  2006  . UMBILICAL HERNIA REPAIR  1982    Social History:  reports that she has never smoked. She has never used smokeless tobacco. She reports that she does not drink alcohol or use drugs. Family History:  Family History  Problem Relation Age of Onset  . Thyroid disease Mother   . Diabetes Father   . Hypertension Father   . Heart disease Father   . Heart attack Father   . Cancer Maternal Aunt        BRAIN  . Cancer Maternal Grandmother        LYMPHOMA  . Hypertension  Maternal Grandmother   . Cancer Paternal Grandmother        PANCREATIC  . Diabetes Paternal Grandmother   . Hypertension Paternal Grandmother   . Hypertension Maternal Grandfather   . Hypertension Paternal Grandfather      HOME MEDICATIONS: Allergies as of 03/06/2019      Reactions   Bee Venom    Codeine Itching   Tolerates Hydrocodone OK.   Ibuprofen Hives, Other (See Comments)   Happened in childhood   Ibuprofen Hives, Swelling   Metformin And Related Other (See Comments)   Muscle weakness   Morphine Itching, Other (See Comments)   Throat swelling   Penicillins    Childhood Allergy Has patient had a PCN reaction causing immediate rash, facial/tongue/throat swelling, SOB or lightheadedness with hypotension: Unknown Has patient had a PCN reaction causing severe rash involving mucus membranes or skin necrosis: Unknown Has patient had a PCN reaction that required hospitalization: Unknown Has patient had a PCN reaction occurring within the last 10 years: Unknown If all of the above answers are "NO", then may proceed with Cephalosporin use.   Aspirin Hives, Swelling, Rash      Medication List       Accurate as of March 06, 2019 12:30 PM. If you have any questions, ask your nurse or doctor.        atorvastatin 10 MG tablet Commonly known as: LIPITOR Take 1 tablet (10 mg total) by mouth daily.   cephALEXin 500 MG capsule Commonly known as: KEFLEX Take 1 capsule (500 mg total) by mouth 4 (four) times daily.   cholecalciferol 1000 units tablet Commonly known as: VITAMIN D Take 2,000 Units by mouth daily.   diclofenac sodium 1 % Gel Commonly known as: VOLTAREN Apply up to 4 times daily to painful joint areas   docusate sodium 100 MG capsule Commonly known as: COLACE Take 2 capsules (200 mg total) by mouth every morning. For stool softner What changed:   how much to take  when to take this   ferrous sulfate 325 (65 FE) MG tablet Take 1 tablet (325 mg total)  by mouth daily with breakfast. For low iron What changed:   how much to take  when to take this  additional instructions   FreeStyle Libre 14 Day Reader Devi 1 kit by Does not apply route 4 (four) times daily.   FreeStyle Libre 14 Day Sensor Misc 1 kit by Does not apply route 4 (four) times daily.   gabapentin 300 MG capsule Commonly known as: Neurontin Take one cap qd x 1 day, then one cap bix x 1 day and then one cap tid thereafter   hydrOXYzine 25 MG tablet Commonly known as: ATARAX/VISTARIL Take   1 tablet (25 mg total) by mouth 2 (two) times daily.   insulin aspart 100 UNIT/ML FlexPen Commonly known as: NovoLOG FlexPen Inject 22 Units into the skin 3 (three) times daily with meals. What changed:   how much to take  when to take this   Insulin Glargine 100 UNIT/ML Solostar Pen Commonly known as: Lantus SoloStar Inject 50 Units into the skin at bedtime. What changed: how much to take   Insulin Pen Needle 32G X 4 MM Misc Commonly known as: TRUEplus Pen Needles Use as instructed to inject insulin.   levothyroxine 125 MCG tablet Commonly known as: SYNTHROID Take 1 tablet (125 mcg total) by mouth daily before breakfast.   lithium carbonate 300 MG capsule Take 600 mg by mouth at bedtime.   lurasidone 80 MG Tabs tablet Commonly known as: LATUDA Take 80 mg by mouth at bedtime.   methocarbamol 500 MG tablet Commonly known as: Robaxin Take 1 tablet (500 mg total) by mouth 3 (three) times daily as needed for muscle spasms.   methylPREDNISolone 4 MG Tbpk tablet Commonly known as: MEDROL DOSEPAK Take as directed   nystatin cream Commonly known as: MYCOSTATIN Apply 1 application topically 2 (two) times daily. To affected skin areas   pioglitazone 30 MG tablet Commonly known as: Actos Take 1 tablet (30 mg total) by mouth daily.   prazosin 1 MG capsule Commonly known as: MINIPRESS Take 1 capsule (1 mg total) by mouth at bedtime and may repeat dose one time if  needed. For sleep/nightmares   propranolol 40 MG tablet Commonly known as: INDERAL Take 1 tablet (40 mg total) by mouth 2 (two) times daily.   tolterodine 4 MG 24 hr capsule Commonly known as: DETROL LA Take 1 capsule (4 mg total) by mouth daily.   traMADol 50 MG tablet Commonly known as: ULTRAM Take 1 tablet (50 mg total) by mouth every 6 (six) hours as needed.        OBJECTIVE:   Vital Signs: BP (!) 138/96 (BP Location: Left Arm, Patient Position: Sitting, Cuff Size: Large)   Pulse 72   Temp 98.1 F (36.7 C)   Ht 5' 10" (1.778 m)   Wt 275 lb 12.8 oz (125.1 kg)   LMP 09/20/2011   SpO2 98%   BMI 39.57 kg/m   Wt Readings from Last 3 Encounters:  03/06/19 275 lb 12.8 oz (125.1 kg)  02/28/19 273 lb 12.8 oz (124.2 kg)  10/31/18 (!) 301 lb 12.8 oz (136.9 kg)     Exam: General: Pt appears well and is in NAD  Lungs: Clear with good BS bilat with no rales, rhonchi, or wheezes  Heart: RRR with normal S1 and S2 and no gallops; no murmurs; no rub  Abdomen: Normoactive bowel sounds, soft, nontender, without masses or organomegaly palpable  Extremities: No pretibial edema.   Skin: Normal texture and temperature to palpation. No rash noted  Neuro: MS is good with appropriate affect, pt is alert and Ox3   DM foot exam:08/01/18 The skin of the feet is intact without sores or ulcerations. The pedal pulses are 2+ on right and 2+ on left. The sensation is intact to a screening 5.07, 10 gram monofilament bilaterally    DATA REVIEWED: In - Office glucose 108 mg/dL  Lab Results  Component Value Date   HGBA1C 6.4 (A) 10/31/2018   HGBA1C 12.6 (A) 06/20/2018   HGBA1C 11.1 (A) 03/15/2018   Lab Results  Component Value Date   LDLCALC 66 04/11/2018  CREATININE 0.86 02/28/2019     ASSESSMENT / PLAN / RECOMMENDATIONS:   1) Type 2 Diabetes Mellitus, Optimally Controlled, Without complications - Most recent A1c of 13.6 %. Goal A1c < 7.0 %.   Up from 6.4%   - Unfortunately she  has lost health insurance and has not been able to check her glucose nor use the prescribed amount of insulin. She has been using 50% less of her insulin to stretch it out. She is currently for the patient assistance program through the wellness clinic per pt.  - Pt was provided lantus/novolog sample until her application is approved.  - Pt urged to fill application soon - If she can;t get pt assistance , will have to resort to using NPH/Regular insulin from [facility].      MEDICATIONS:  Lantus to 50 units   Continue Novolog 22 units   Continue Pioglitazone at 30 mg daily   EDUCATION / INSTRUCTIONS:  BG monitoring instructions: Patient is instructed to check her blood sugars 4 times a day,before meals and bedtime.  Call [facility] if: BG persistently < 70 or > 300. . I reviewed the Rule of 15 for the treatment of hypoglycemia in detail with the patient. Literature supplied.   F/U in 3 months     Signed electronically by: Abby Jaralla , MD  [facility]  [facility] [address] [place], [place] [address] Phone: [phone] FAX: [phone]   CC: Fulp, Cammie, MD [address] [place] [address] Phone: [phone]  Fax: [phone]  Return to Endocrinology clinic as below: Future Appointments  Date Time Provider Department Center  05/27/2019 11:10 AM ,  Jaralla, MD [facility] None  07/03/2019 11:10 AM Fulp, Cammie, MD [facility] None      

## 2019-03-08 ENCOUNTER — Other Ambulatory Visit: Payer: Self-pay | Admitting: Family Medicine

## 2019-03-08 DIAGNOSIS — E785 Hyperlipidemia, unspecified: Secondary | ICD-10-CM

## 2019-03-08 DIAGNOSIS — E1169 Type 2 diabetes mellitus with other specified complication: Secondary | ICD-10-CM

## 2019-03-08 MED ORDER — ATORVASTATIN CALCIUM 10 MG PO TABS: 10.0000 mg | ORAL_TABLET | Freq: Every day | ORAL | 3 refills | Status: DC

## 2019-03-08 NOTE — Progress Notes (Signed)
Patient ID: Lynn Martinez, female   DOB: 1970-07-29, 47 y.o.   MRN: [id number]   Lipid panel with LDL of 105 and since patient is diabetic her goal LDL is 70 or less and she has not been taking her atorvastatin therefore new RX sent to patient's pharmacy

## 2019-03-18 ENCOUNTER — Ambulatory Visit: Payer: Self-pay

## 2019-03-25 ENCOUNTER — Ambulatory Visit: Payer: Self-pay | Attending: Family Medicine

## 2019-03-25 ENCOUNTER — Other Ambulatory Visit: Payer: Self-pay

## 2019-04-17 ENCOUNTER — Telehealth: Payer: Self-pay | Admitting: Family Medicine

## 2019-04-17 DIAGNOSIS — R32 Unspecified urinary incontinence: Secondary | ICD-10-CM

## 2019-04-17 MED ORDER — NOVOLOG FLEXPEN 100 UNIT/ML ~~LOC~~ SOPN: 22.0000 [IU] | PEN_INJECTOR | Freq: Three times a day (TID) | SUBCUTANEOUS | 2 refills | Status: DC

## 2019-04-17 MED ORDER — LANTUS SOLOSTAR 100 UNIT/ML ~~LOC~~ SOPN: 50.0000 [IU] | PEN_INJECTOR | Freq: Every day | SUBCUTANEOUS | 11 refills | Status: DC

## 2019-04-17 MED ORDER — TOLTERODINE TARTRATE ER 4 MG PO CP24: 4.0000 mg | ORAL_CAPSULE | Freq: Every day | ORAL | 3 refills | Status: DC

## 2019-04-17 NOTE — Telephone Encounter (Signed)
1) Medication(s) Requested (by name): lantus novolog detrol 2) [facility]:  [facility] called to request medication be sent over to them at fax [phone] they are helping the patient receive her medications for free. Please follow up

## 2019-04-17 NOTE — Telephone Encounter (Signed)
Prescriptions sent

## 2019-04-21 ENCOUNTER — Encounter: Payer: Self-pay | Admitting: Family Medicine

## 2019-05-01 ENCOUNTER — Telehealth: Payer: Self-pay

## 2019-05-01 MED ORDER — FREESTYLE LIBRE 14 DAY SENSOR MISC: 1.0000 | Freq: Four times a day (QID) | 11 refills | Status: DC

## 2019-05-01 NOTE — Telephone Encounter (Signed)
Pt is aware that rx is at her pharmacy.

## 2019-05-01 NOTE — Telephone Encounter (Signed)
[facility] called in stating pharmacy needs the Rx for her Continuous Blood Gluc Sensor ([place]) [place], [place] [address]

## 2019-05-22 ENCOUNTER — Other Ambulatory Visit: Payer: Self-pay

## 2019-05-22 ENCOUNTER — Telehealth: Payer: Self-pay | Admitting: Internal Medicine

## 2019-05-22 MED ORDER — PIOGLITAZONE HCL 30 MG PO TABS: 30.0000 mg | ORAL_TABLET | Freq: Every day | ORAL | 6 refills | Status: DC

## 2019-05-22 NOTE — Telephone Encounter (Signed)
MEDICATION: pioglitazone (ACTOS) 30 MG tablet  PHARMACY:   [place], [place] [address] [phone] (Phone) [phone] (Fax)    IS THIS A 90 DAY SUPPLY : Yes  IS PATIENT OUT OF MEDICATION: Yes  IF NOT; HOW MUCH IS LEFT: 0  LAST APPOINTMENT DATE: @11 /18/2020  NEXT APPOINTMENT DATE:@12 /14/2020  DO WE HAVE YOUR PERMISSION TO LEAVE A DETAILED MESSAGE: Yes  OTHER COMMENTS:    **Let patient know to contact pharmacy at the end of the day to make sure medication is ready. **  ** Please notify patient to allow 48-72 hours to process**  **Encourage patient to contact the pharmacy for refills or they can request refills through [facility]**

## 2019-05-22 NOTE — Telephone Encounter (Signed)
Patient states [facility] does not fill the previous RX sent for below RX. Patient requests the following:  MEDICATION: Continuous Blood Gluc Sensor (FREESTYLE LIBRE 14 DAY SENSOR) MISC  PHARMACY:  [facility] on [address] : Yes  IS PATIENT OUT OF MEDICATION: Yes  IF NOT; HOW MUCH IS LEFT: 0  LAST APPOINTMENT DATE: @12 /02/2019  NEXT APPOINTMENT DATE:@12 /14/2020  DO WE HAVE YOUR PERMISSION TO LEAVE A DETAILED MESSAGE: Yes  OTHER COMMENTS:    **Let patient know to contact pharmacy at the end of the day to make sure medication is ready. **  ** Please notify patient to allow 48-72 hours to process**  **Encourage patient to contact the pharmacy for refills or they can request refills through [facility]**

## 2019-05-23 ENCOUNTER — Other Ambulatory Visit: Payer: Self-pay

## 2019-05-27 ENCOUNTER — Other Ambulatory Visit: Payer: Self-pay

## 2019-05-27 ENCOUNTER — Encounter: Payer: Self-pay | Admitting: Internal Medicine

## 2019-05-27 ENCOUNTER — Other Ambulatory Visit (HOSPITAL_COMMUNITY): Payer: Self-pay | Admitting: *Deleted

## 2019-05-27 ENCOUNTER — Ambulatory Visit (INDEPENDENT_AMBULATORY_CARE_PROVIDER_SITE_OTHER): Payer: Self-pay | Admitting: Internal Medicine

## 2019-05-27 VITALS — BP 118/64 | HR 69 | Temp 98.3°F | Ht 70.0 in | Wt 280.4 lb

## 2019-05-27 DIAGNOSIS — E119 Type 2 diabetes mellitus without complications: Secondary | ICD-10-CM

## 2019-05-27 DIAGNOSIS — N6320 Unspecified lump in the left breast, unspecified quadrant: Secondary | ICD-10-CM

## 2019-05-27 DIAGNOSIS — Z794 Long term (current) use of insulin: Secondary | ICD-10-CM

## 2019-05-27 DIAGNOSIS — E1165 Type 2 diabetes mellitus with hyperglycemia: Secondary | ICD-10-CM

## 2019-05-27 LAB — GLUCOSE, POCT (MANUAL RESULT ENTRY): POC Glucose: 105 mg/dl — AB (ref 70–99)

## 2019-05-27 LAB — POCT GLYCOSYLATED HEMOGLOBIN (HGB A1C): Hemoglobin A1C: 13.2 % — AB (ref 4.0–5.6)

## 2019-05-27 MED ORDER — BASAGLAR KWIKPEN 100 UNIT/ML ~~LOC~~ SOPN: 55.0000 [IU] | PEN_INJECTOR | Freq: Every day | SUBCUTANEOUS | 3 refills | Status: DC

## 2019-05-27 MED ORDER — HUMALOG KWIKPEN 200 UNIT/ML ~~LOC~~ SOPN: 22.0000 [IU] | PEN_INJECTOR | Freq: Three times a day (TID) | SUBCUTANEOUS | 3 refills | Status: DC

## 2019-05-27 NOTE — Patient Instructions (Addendum)
Basaglar 55 units daily ( To replace the lantus) Humalog 22 units with each meal ( To replace the Novolog )  Actos 30 mg , 1 tablet daily      HOW TO TREAT LOW BLOOD SUGARS (Blood sugar LESS THAN 70 MG/DL)  Please follow the RULE OF 15 for the treatment of hypoglycemia treatment (when your (blood sugars are less than 70 mg/dL)    STEP 1: Take 15 grams of carbohydrates when your blood sugar is low, which includes:   3-4 GLUCOSE TABS  OR  3-4 OZ OF JUICE OR REGULAR SODA OR  ONE TUBE OF GLUCOSE GEL     STEP 2: RECHECK blood sugar in 15 MINUTES STEP 3: If your blood sugar is still low at the 15 minute recheck --> then, go back to STEP 1 and treat AGAIN with another 15 grams of carbohydrates.;e

## 2019-05-27 NOTE — Progress Notes (Signed)
Name: Lynn Martinez  Age/ Sex: 48 y.o., female   MRN/ DOB: [id number], 1970-08-29     PCP: Antony Blackbird, MD   Reason for Endocrinology Evaluation: Type 2 Diabetes Mellitus  Initial Endocrine Consultative Visit: 12/20    PATIENT IDENTIFIER: [name] is a 48 y.o. female with a past medical history of T2DM, hypothyroidism, bipolar disorder and HTN . The patient has followed with Endocrinology clinic since 06/01/18 for consultative assistance with management of her diabetes.  DIABETIC HISTORY:  [name] was diagnosed with T2DM ~ 2 yrs ago, and started insulin therapy shortly after the diagnosis. She has tried Glipizide and Januvia in the past.  Her hemoglobin A1c has ranged from 9.1%  in 2017, peaking at 11.1% in 2019.   SUBJECTIVE:   During the last visit (03/06/2019): A1c was 13.6%. No changes were made, as she has not been taking medications regularly due to loss of insurance.     Today (05/27/2019): [name] is here for a 3 month  follow up on her diabetes management. She has not been checking her glucose as she continues with loss of  insurance and has not been able to obtain the freestyle libre. She also has not been able to get a steady supply of insulin until ~ 2 weeks ago. Otherwise, the patient has not required any recent emergency interventions for hypoglycemia and has not had recent hospitalizations secondary to hyper or hypoglycemic episodes.    She is getting her insulin through the health department now, only received it for about 2 weeks   She denies any side effects to pioglitazone, she continues to avoid sugar sweetened beverages.  Fiance had maze heart surgery 8/27th followed by a psychotic episode    ROS: As per HPI and as detailed below: Review of Systems  Constitutional: Negative for fever and weight loss.  Eyes: Negative for blurred vision and pain.  Respiratory: Negative for cough and shortness of breath.     Cardiovascular: Negative for chest pain and palpitations.  Gastrointestinal: Negative for diarrhea and nausea.      HOME DIABETES REGIMEN:  Lantus 55 units  Novolog 22 units QAC  Actos 30 mg daily     GLUCOSE LOG:did not bring      HISTORY:  Past Medical History:  Past Medical History:  Diagnosis Date  . Anemia   . Anxiety   . Arthritis    "knees" (09/30/2015)  . Bipolar disorder ([place])   . Depression   . Diabetes mellitus without complication ([place])   . Fibroid    s/p myomectomy 2010, hysterectomy 2013  . Grave's disease   . Heart murmur   . Hormone disorder   . Hypertension   . Hypothyroidism   . 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 Migraine     otc med prn  . PCOS (polycystic ovarian syndrome)   . PMS (premenstrual syndrome)   . PTSD (post-traumatic stress disorder)   . Seasonal allergies   . Thyroid cancer ([place]) 2005  . Thyroid disease   . Type II diabetes mellitus ([place])   . Vertigo    Past Surgical History:  Past Surgical History:  Procedure Laterality Date  . ABDOMINAL HYSTERECTOMY  10/11/2011   Procedure: HYSTERECTOMY ABDOMINAL;  Surgeon: Terrance Mass, MD;  Location: [facility];  Service: Gynecology;  Laterality: N/A;  With Repair of serosa.  . ABDOMINAL HYSTERECTOMY    . CHOLECYSTECTOMY N/A 09/06/2013  Procedure: LAPAROSCOPIC CHOLECYSTECTOMY WITH INTRAOPERATIVE CHOLANGIOGRAM;  Surgeon: Merrie Roof, MD;  Location: [facility];  Service: General;  Laterality: N/A;  . DIAGNOSTIC LAPAROSCOPY    . DILATION AND CURETTAGE OF UTERUS    . HERNIA REPAIR    . MYOMECTOMY  2010   LAPAROSCOPY  . thyroid removed    . TOTAL THYROIDECTOMY  2006  . UMBILICAL HERNIA REPAIR  1982    Social History:  reports that she has never smoked. She has never used smokeless tobacco. She reports that she does not drink alcohol or use drugs. Family History:  Family History  Problem Relation Age of Onset  . Thyroid disease Mother   . Diabetes  Father   . Hypertension Father   . Heart disease Father   . Heart attack Father   . Cancer Maternal Aunt        BRAIN  . Cancer Maternal Grandmother        LYMPHOMA  . Hypertension Maternal Grandmother   . Cancer Paternal Grandmother        PANCREATIC  . Diabetes Paternal Grandmother   . Hypertension Paternal Grandmother   . Hypertension Maternal Grandfather   . Hypertension Paternal Grandfather      HOME MEDICATIONS: Allergies as of 05/27/2019      Reactions   Bee Venom    Codeine Itching   Tolerates Hydrocodone OK.   Ibuprofen Hives, Other (See Comments)   Happened in childhood   Ibuprofen Hives, Swelling   Metformin And Related Other (See Comments)   Muscle weakness   Morphine Itching, Other (See Comments)   Throat swelling   Penicillins    Childhood Allergy Has patient had a PCN reaction causing immediate rash, facial/tongue/throat swelling, SOB or lightheadedness with hypotension: Unknown Has patient had a PCN reaction causing severe rash involving mucus membranes or skin necrosis: Unknown Has patient had a PCN reaction that required hospitalization: Unknown Has patient had a PCN reaction occurring within the last 10 years: Unknown If all of the above answers are "NO", then may proceed with Cephalosporin use.   Aspirin Hives, Swelling, Rash      Medication List       Accurate as of May 27, 2019 11:52 AM. If you have any questions, ask your nurse or doctor.        STOP taking these medications   NovoLOG FlexPen 100 UNIT/ML FlexPen Generic drug: insulin aspart Stopped by: Dorita Sciara, MD     TAKE these medications   atorvastatin 10 MG tablet Commonly known as: LIPITOR Take 1 tablet (10 mg total) by mouth daily. To lower cholesterol   Basaglar KwikPen 100 UNIT/ML Sopn Inject 0.55 mLs (55 Units total) into the skin daily. What changed:   how much to take  when to take this Changed by: Dorita Sciara, MD   cephALEXin 500 MG  capsule Commonly known as: KEFLEX Take 1 capsule (500 mg total) by mouth 4 (four) times daily.   cholecalciferol 1000 units tablet Commonly known as: VITAMIN D Take 2,000 Units by mouth daily.   diclofenac sodium 1 % Gel Commonly known as: VOLTAREN Apply up to 4 times daily to painful joint areas   docusate sodium 100 MG capsule Commonly known as: COLACE Take 2 capsules (200 mg total) by mouth every morning. For stool softner What changed:   how much to take  when to take this   ferrous sulfate 325 (65 FE) MG tablet Take 1 tablet (325 mg total)  by mouth daily with breakfast. For low iron What changed:   how much to take  when to take this  additional instructions   FreeStyle Libre 14 Day Reader Kerrin Mo 1 kit by Does not apply route 4 (four) times daily.   FreeStyle Libre 14 Day Sensor Misc 1 kit by Does not apply route 4 (four) times daily.   gabapentin 300 MG capsule Commonly known as: Neurontin Take one cap qd x 1 day, then one cap bix x 1 day and then one cap tid thereafter   HumaLOG KwikPen 200 UNIT/ML Sopn Generic drug: Insulin Lispro Inject 22 Units into the skin 3 (three) times daily. Started by: Dorita Sciara, MD   hydrOXYzine 25 MG tablet Commonly known as: ATARAX/VISTARIL Take 1 tablet (25 mg total) by mouth 2 (two) times daily.   Insulin Pen Needle 32G X 4 MM Misc Commonly known as: TRUEplus Pen Needles Use as instructed to inject insulin.   levothyroxine 125 MCG tablet Commonly known as: SYNTHROID Take 1 tablet (125 mcg total) by mouth daily before breakfast.   lithium carbonate 300 MG capsule Take 600 mg by mouth at bedtime.   lurasidone 80 MG Tabs tablet Commonly known as: LATUDA Take 80 mg by mouth at bedtime.   methocarbamol 500 MG tablet Commonly known as: Robaxin Take 1 tablet (500 mg total) by mouth 3 (three) times daily as needed for muscle spasms.   methylPREDNISolone 4 MG Tbpk tablet Commonly known as: MEDROL  DOSEPAK Take as directed   nystatin cream Commonly known as: MYCOSTATIN Apply 1 application topically 2 (two) times daily. To affected skin areas   pioglitazone 30 MG tablet Commonly known as: Actos Take 1 tablet (30 mg total) by mouth daily.   prazosin 1 MG capsule Commonly known as: MINIPRESS Take 1 capsule (1 mg total) by mouth at bedtime and may repeat dose one time if needed. For sleep/nightmares   propranolol 40 MG tablet Commonly known as: INDERAL Take 1 tablet (40 mg total) by mouth 2 (two) times daily.   tolterodine 4 MG 24 hr capsule Commonly known as: DETROL LA Take 1 capsule (4 mg total) by mouth daily.   traMADol 50 MG tablet Commonly known as: ULTRAM Take 1 tablet (50 mg total) by mouth every 6 (six) hours as needed.        OBJECTIVE:   Vital Signs: BP 118/64 (BP Location: Left Arm, Patient Position: Sitting, Cuff Size: Large)   Pulse 69   Temp 98.3 F (36.8 C)   Ht '5\' 10"'$  (1.778 m)   Wt 280 lb 6.4 oz (127.2 kg)   LMP 09/20/2011   SpO2 98%   BMI 40.23 kg/m   Wt Readings from Last 3 Encounters:  05/27/19 280 lb 6.4 oz (127.2 kg)  03/06/19 275 lb 12.8 oz (125.1 kg)  02/28/19 273 lb 12.8 oz (124.2 kg)     Exam: General: Pt appears well and is in NAD  Lungs: Clear with good BS bilat with no rales, rhonchi, or wheezes  Heart: RRR with normal S1 and S2 and no gallops; no murmurs; no rub  Abdomen: Normoactive bowel sounds, soft, nontender, without masses or organomegaly palpable  Extremities: No pretibial edema.   Skin: Normal texture and temperature to palpation. No rash noted  Neuro: MS is good with appropriate affect, pt is alert and Ox3   DM foot exam:05/27/19 The skin of the feet is intact without sores or ulcerations. The pedal pulses are 2+ on right and 2+  on left. The sensation is intact to a screening 5.07, 10 gram monofilament bilaterally    DATA REVIEWED:  Results for SAMMI, STOLARZ (MRN [id number]) as of 05/27/2019  11:52  Ref. Range 06/20/2018 09:22 10/31/2018 10:46 03/06/2019 11:07 05/27/2019 11:14  Hemoglobin A1C Latest Ref Range: 4.0 - 5.6 % 12.6 (A) 6.4 (A) 13.6 (A) 13.2 (A)   Lab Results  Component Value Date   LDLCALC 105 (H) 02/28/2019   CREATININE 0.86 02/28/2019     ASSESSMENT / PLAN / RECOMMENDATIONS:   1) Type 2 Diabetes Mellitus, Optimally Controlled, Without complications - Most recent A1c of 13.2 %. Goal A1c < 7.0 %.    - Pt continues with hyperglycemia due to lack of consistent supply of medication due to lack of insurance. She has been getting insulin through the [facility]. For the past ~2-3 weeks, will proceed with patient assistance program, she was provided with forms today.  - She continues to avoid  Sugar-sweetened beverages.  - Pt is motivated to continue to improve diabetes control  - Will obtain Basaglar and Humalog from [employer] and her med list will reflect that    MEDICATIONS:  Basaglar 55 units   Humalog  22 units TIDQAC  Pioglitazone 30 mg daily   EDUCATION / INSTRUCTIONS:  BG monitoring instructions: Patient is instructed to check her blood sugars 4 times a day,before meals and bedtime.  Call [facility] if: BG persistently < 70 or > 300. . I reviewed the Rule of 15 for the treatment of hypoglycemia in detail with the patient. Literature supplied.   F/U in 3 months     Signed electronically by: Mack Guise, MD  [facility] Endocrinology  [address] [place], [place] [address] Phone: [phone] FAX: [phone]   CC: Antony Blackbird, MD [place] [address] Phone: [phone]  Fax: [phone]  Return to Endocrinology clinic as below: Future Appointments  Date Time Provider [facility]  07/03/2019 11:10 AM Antony Blackbird, MD [facility] None  08/26/2019 11:10 AM Breeona Waid, Melanie Crazier, MD [facility] None

## 2019-06-11 ENCOUNTER — Other Ambulatory Visit: Payer: Self-pay

## 2019-06-11 ENCOUNTER — Encounter (HOSPITAL_COMMUNITY): Payer: Self-pay

## 2019-06-11 ENCOUNTER — Ambulatory Visit (HOSPITAL_COMMUNITY)
Admission: RE | Admit: 2019-06-11 | Discharge: 2019-06-11 | Disposition: A | Discharge status: Home / Self Care | Payer: Self-pay | Source: Ambulatory Visit | Attending: Obstetrics and Gynecology | Admitting: Obstetrics and Gynecology

## 2019-06-11 DIAGNOSIS — N644 Mastodynia: Secondary | ICD-10-CM | POA: Insufficient documentation

## 2019-06-11 DIAGNOSIS — Z1239 Encounter for other screening for malignant neoplasm of breast: Secondary | ICD-10-CM

## 2019-06-11 NOTE — Progress Notes (Signed)
Complaints of left breast lump and pain x 5-6 months. Patient states the pain comes and goes. Patient rates the pain at a 5 out of 10.  Pap Smear: Pap smear not completed today. Last Pap smear was in 2012 and normal per patient. Per patient has no history of an abnormal Pap smear. Patient has a history of a hysterectomy 10/11/2011 due to fibroids. Patient no longer needs Pap smears due to her history of a hysterectomy for benign reasons per BCCCP and ACOG guidelines. No Pap smear results are in EPIC.  Physical exam: Breasts Breasts symmetrical. No skin abnormalities bilateral breasts. No nipple retraction bilateral breasts. No nipple discharge bilateral breasts. No lymphadenopathy. No lumps palpated bilateral breasts. Unable to palpate a lump in patients area of concern. Complaints of diffuse left breast pain on exam that was greater within the lower breast. Referred patient to the [facility] for a diagnostic mammogram. Appointment scheduled for Monday, June 23, 2018 at 1400.        Pelvic/Bimanual No Pap smear completed today since patient has a history of a hysterectomy for benign reasons. Pap smear not indicated per BCCCP guidelines.    Smoking History: Patient has never smoked.  Patient Navigation: Patient education provided. Access to services provided for patient through BCCCP program.   Breast and Cervical Cancer Risk Assessment: Patient has no family history of breast cancer, known genetic mutations, or radiation treatment to the chest before age 55. Patient has no history of cervical dysplasia, immunocompromised, or DES exposure in-utero.  Risk Assessment    Risk Scores      06/11/2019   Last edited by: Loletta Parish, RN   5-year risk: 0.9 %   Lifetime risk: 9.2 %

## 2019-06-11 NOTE — Patient Instructions (Addendum)
Explained breast self awareness to [employer]. Patient did not need a Pap smear today due to her history of a hysterectomy for benign reasons. Let patient know that she doesn't need any further Pap smears due to her history of a hysterectomy for benign reasons. Referred patient to the [facility] for a diagnostic mammogram. Appointment scheduled for Monday, June 23, 2018 at 1400. [name] verbalized understanding.  Ernesha Ramone, Arvil Chaco, RN 1:34 PM

## 2019-06-24 ENCOUNTER — Ambulatory Visit
Admission: RE | Admit: 2019-06-24 | Discharge: 2019-06-24 | Disposition: A | Discharge status: Home / Self Care | Payer: No Typology Code available for payment source | Source: Ambulatory Visit | Attending: Obstetrics and Gynecology | Admitting: Obstetrics and Gynecology

## 2019-06-24 ENCOUNTER — Other Ambulatory Visit (HOSPITAL_COMMUNITY): Payer: Self-pay | Admitting: Obstetrics and Gynecology

## 2019-06-24 DIAGNOSIS — N6320 Unspecified lump in the left breast, unspecified quadrant: Secondary | ICD-10-CM

## 2019-06-25 ENCOUNTER — Other Ambulatory Visit: Payer: Self-pay | Admitting: Family Medicine

## 2019-06-25 NOTE — Telephone Encounter (Signed)
Patient has humalog and baslagar refilled December 2020. Please advise if patients needs novolog refilled.

## 2019-07-03 ENCOUNTER — Encounter: Payer: Self-pay | Admitting: Family Medicine

## 2019-07-03 ENCOUNTER — Other Ambulatory Visit: Payer: Self-pay

## 2019-07-03 ENCOUNTER — Ambulatory Visit: Payer: Self-pay | Attending: Family Medicine | Admitting: Family Medicine

## 2019-07-03 DIAGNOSIS — R0789 Other chest pain: Secondary | ICD-10-CM

## 2019-07-03 MED ORDER — OMEPRAZOLE 40 MG PO CPDR: 40.0000 mg | DELAYED_RELEASE_CAPSULE | Freq: Two times a day (BID) | ORAL | 3 refills | Status: DC

## 2019-07-03 NOTE — Progress Notes (Signed)
Fasting 79 as per patint  BP today 137/74 P 68

## 2019-07-03 NOTE — Progress Notes (Signed)
Virtual Visit via Telephone Note  I connected with Lynn Martinez on 07/03/19 at 11:10 AM EST by telephone and verified that I am speaking with the correct person using two identifiers.   I discussed the limitations, risks, security and privacy concerns of performing an evaluation and management service by telephone and the availability of in person appointments. I also discussed with the patient that there may be a patient responsible charge related to this service. The patient expressed understanding and agreed to proceed.  Patient Location: Home Provider Location: [facility] Office Others participating in call: none   History of Present Illness:        49 yo female with the complaint of mid-upper chest pain- stabbing in nature. Pain occurs randomly and lasts for a few minutes.  Discomfort is generally in the mid upper chest to mid upper abdomen.  She denies nausea or vomiting.  Discomfort is more likely to occur at night and especially after eating late at night.  She denies any diaphoresis/sweating associated with the pain.  She does have some burping/belching and sensation of backwash of bad tasting fluid into the mouth and throat at times.  She does not have any radiation of pain into the back/shoulders/arms, neck or jaw.  She denies numbness or tingling in her fingertips associated with chest pain.  She has taken reflux medicine in the past but is not currently on reflux medication.  She denies any current issues with nausea/vomiting/diarrhea or constipation.  No sensation of palpitations, no shortness of breath or cough.   Past Medical History:  Diagnosis Date  . Anemia   . Anxiety   . Arthritis    "knees" (09/30/2015)  . Bipolar disorder ([place])   . Depression   . Diabetes mellitus without complication ([place])   . Fibroid    s/p myomectomy 2010, hysterectomy 2013  . Grave's disease   . Heart murmur   . Hormone disorder   . Hypertension   . Hypothyroidism   . Memory loss    "brain  Fog" per pt related to thyroid condition  . Migraine     otc med prn  . PCOS (polycystic ovarian syndrome)   . PMS (premenstrual syndrome)   . PTSD (post-traumatic stress disorder)   . Seasonal allergies   . Thyroid cancer ([place]) 2005  . Thyroid disease   . Type II diabetes mellitus ([place])   . Vertigo     Past Surgical History:  Procedure Laterality Date  . ABDOMINAL HYSTERECTOMY  10/11/2011   Procedure: HYSTERECTOMY ABDOMINAL;  Surgeon: Terrance Mass, MD;  Location: [facility];  Service: Gynecology;  Laterality: N/A;  With Repair of serosa.  . ABDOMINAL HYSTERECTOMY    . CHOLECYSTECTOMY N/A 09/06/2013   Procedure: LAPAROSCOPIC CHOLECYSTECTOMY WITH INTRAOPERATIVE CHOLANGIOGRAM;  Surgeon: Merrie Roof, MD;  Location: [facility];  Service: General;  Laterality: N/A;  . DIAGNOSTIC LAPAROSCOPY    . DILATION AND CURETTAGE OF UTERUS    . HERNIA REPAIR    . MYOMECTOMY  2010   LAPAROSCOPY  . thyroid removed    . TOTAL THYROIDECTOMY  2006  . UMBILICAL HERNIA REPAIR  1982    Family History  Problem Relation Age of Onset  . Thyroid disease Mother   . Diabetes Father   . Hypertension Father   . Heart disease Father   . Heart attack Father   . Cancer Maternal Aunt        BRAIN  . Cancer Maternal Grandmother  LYMPHOMA  . Hypertension Maternal Grandmother   . Cancer Paternal Grandmother        PANCREATIC  . Diabetes Paternal Grandmother   . Hypertension Paternal Grandmother   . Hypertension Maternal Grandfather   . Diabetes Maternal Grandfather   . Hypertension Paternal Grandfather   . Pancreatic cancer Paternal Grandfather     Social History   Tobacco Use  . Smoking status: Never Smoker  . Smokeless tobacco: Never Used  Substance Use Topics  . Alcohol use: Never  . Drug use: Never     Allergies  Allergen Reactions  . Bee Venom   . Codeine Itching    Tolerates Hydrocodone OK.  . Ibuprofen Hives and Other (See Comments)    Happened in childhood  . Ibuprofen Hives and  Swelling  . Metformin And Related Other (See Comments)    Muscle weakness  . Morphine Itching and Other (See Comments)    Throat swelling  . Penicillins     Childhood Allergy Has patient had a PCN reaction causing immediate rash, facial/tongue/throat swelling, SOB or lightheadedness with hypotension: Unknown Has patient had a PCN reaction causing severe rash involving mucus membranes or skin necrosis: Unknown Has patient had a PCN reaction that required hospitalization: Unknown Has patient had a PCN reaction occurring within the last 10 years: Unknown If all of the above answers are "NO", then may proceed with Cephalosporin use.   . Aspirin Hives, Swelling and Rash       Observations/Objective: No vital signs or physical exam conducted as visit was done via telephone  Assessment and Plan: 1. Atypical chest pain Based on the location and type of pain the patient is experiencing, her symptoms are most likely coming from acid reflux as she does have reflux symptoms in addition to her substernal chest discomfort.  She has been asked to start omeprazole 40 mg twice daily, avoid known trigger foods as well as avoidance of eating within 2 hours of bedtime.  She has also been advised to obtain a chest x-ray to rule out musculoskeletal cause or widening of the aorta which may be seen on chest x-ray.  Because she is also diabetic which can increase the risk of heart disease, she has been asked to consider referral to cardiology for further evaluation.  She should continue the use of Lipitor and continue to make sure that her blood sugars remain well controlled.  If she has worsening onset of chest pain, chest pain with radiation to the neck/jaw, upper back or arms or any other concerns she should go to the emergency department for further evaluation and treatment. - omeprazole (PRILOSEC) 40 MG capsule; Take 1 capsule (40 mg total) by mouth 2 (two) times daily.  Dispense: 60 capsule; Refill: 3 - DG  Chest 2 View; Future   Follow Up Instructions:Return in about 4 weeks (around 07/31/2019) for atypical chest pain; ED if symptoms worsen.   I discussed the assessment and treatment plan with the patient. The patient was provided an opportunity to ask questions and all were answered. The patient agreed with the plan and demonstrated an understanding of the instructions.   The patient was advised to call back or seek an in-person evaluation if the symptoms worsen or if the condition fails to improve as anticipated.  I provided 9 minutes of non-face-to-face time during this encounter.   Antony Blackbird, MD

## 2019-07-05 ENCOUNTER — Ambulatory Visit (INDEPENDENT_AMBULATORY_CARE_PROVIDER_SITE_OTHER): Payer: Self-pay | Admitting: Internal Medicine

## 2019-07-05 ENCOUNTER — Encounter: Payer: Self-pay | Admitting: Internal Medicine

## 2019-07-05 ENCOUNTER — Other Ambulatory Visit: Payer: Self-pay

## 2019-07-05 VITALS — BP 132/68 | HR 80 | Temp 98.6°F | Ht 70.0 in | Wt 296.8 lb

## 2019-07-05 DIAGNOSIS — Z794 Long term (current) use of insulin: Secondary | ICD-10-CM

## 2019-07-05 DIAGNOSIS — E119 Type 2 diabetes mellitus without complications: Secondary | ICD-10-CM

## 2019-07-05 LAB — POCT GLYCOSYLATED HEMOGLOBIN (HGB A1C): Hemoglobin A1C: 8.3 % — AB (ref 4.0–5.6)

## 2019-07-05 NOTE — Progress Notes (Signed)
Name: Lynn Martinez  Age/ Sex: 49 y.o., female   MRN/ DOB: [id number], 1971/05/26     PCP: Antony Blackbird, MD   Reason for Endocrinology Evaluation: Type 2 Diabetes Mellitus  Initial Endocrine Consultative Visit: 12/20    PATIENT IDENTIFIER: [name] is a 49 y.o. female with a past medical history of T2DM, hypothyroidism, bipolar disorder and HTN . The patient has followed with Endocrinology clinic since 06/01/18 for consultative assistance with management of her diabetes.  DIABETIC HISTORY:  [name] was diagnosed with T2DM ~ 2019, and started insulin therapy shortly after the diagnosis. She has tried Glipizide and Januvia in the past.  Her hemoglobin A1c has ranged from 9.1%  in 2017, peaking at 11.1% in 2019.   SUBJECTIVE:   During the last visit (05/27/2019): A1c was 13.2% due to inconsistent supply of insulin. We continued MDI regimena dn actos, she was provided with PA program papers to fill out.    Today (07/05/2019): [name] is here for a  follow up on her diabetes management and fill out DMV forms. She just started  checking her glucose a few days ago. She is back to taking her insulin and actos again. She continues to use the health department. She was provided with patient assistance program papers and stated she dropped them off but there's no documentation in the chart and we don;t have them. Otherwise, the patient has not required any recent emergency interventions for hypoglycemia and has not had recent hospitalizations secondary to hyper or hypoglycemic episodes.    She got married to [employer] (Lynn Martinez) on 06/14/2019   ROS: As per HPI and as detailed below: Review of Systems  Constitutional: Negative for fever and weight loss.  Eyes: Negative for blurred vision and pain.  Respiratory: Negative for cough and shortness of breath.   Cardiovascular: Negative for chest pain and palpitations.  Gastrointestinal: Negative for diarrhea and  nausea.      HOME DIABETES REGIMEN:  Lantus 55 units  Novolog 22 units QAC  Actos 30 mg daily     GLUCOSE LOG: 79- 165 mg/dL    DIABETIC COMPLICATIONS: Microvascular complications:    Denies: neuropathy, nephropathy, retinopathy  Last eye exam: Completed 2019  Macrovascular complications:    Denies: CAD, PVD, CVA   HISTORY:  Past Medical History:  Past Medical History:  Diagnosis Date  . Anemia   . Anxiety   . Arthritis    "knees" (09/30/2015)  . Bipolar disorder ([place])   . Depression   . Diabetes mellitus without complication ([place])   . Fibroid    s/p myomectomy 2010, hysterectomy 2013  . Grave's disease   . Heart murmur   . Hormone disorder   . Hypertension   . Hypothyroidism   . 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 Migraine     otc med prn  . PCOS (polycystic ovarian syndrome)   . PMS (premenstrual syndrome)   . PTSD (post-traumatic stress disorder)   . Seasonal allergies   . Thyroid cancer ([place]) 2005  . Thyroid disease   . Type II diabetes mellitus ([place])   . Vertigo    Past Surgical History:  Past Surgical History:  Procedure Laterality Date  . ABDOMINAL HYSTERECTOMY  10/11/2011   Procedure: HYSTERECTOMY ABDOMINAL;  Surgeon: Terrance Mass, MD;  Location: [facility];  Service: Gynecology;  Laterality: N/A;  With Repair of serosa.  . ABDOMINAL HYSTERECTOMY    .  CHOLECYSTECTOMY N/A 09/06/2013   Procedure: LAPAROSCOPIC CHOLECYSTECTOMY WITH INTRAOPERATIVE CHOLANGIOGRAM;  Surgeon: Merrie Roof, MD;  Location: [facility];  Service: General;  Laterality: N/A;  . DIAGNOSTIC LAPAROSCOPY    . DILATION AND CURETTAGE OF UTERUS    . HERNIA REPAIR    . MYOMECTOMY  2010   LAPAROSCOPY  . thyroid removed    . TOTAL THYROIDECTOMY  2006  . UMBILICAL HERNIA REPAIR  1982    Social History:  reports that she has never smoked. She has never used smokeless tobacco. She reports that she does not drink alcohol or use drugs. Family  History:  Family History  Problem Relation Age of Onset  . Thyroid disease Mother   . Diabetes Father   . Hypertension Father   . Heart disease Father   . Heart attack Father   . Cancer Maternal Aunt        BRAIN  . Cancer Maternal Grandmother        LYMPHOMA  . Hypertension Maternal Grandmother   . Cancer Paternal Grandmother        PANCREATIC  . Diabetes Paternal Grandmother   . Hypertension Paternal Grandmother   . Hypertension Maternal Grandfather   . Diabetes Maternal Grandfather   . Hypertension Paternal Grandfather   . Pancreatic cancer Paternal Grandfather      HOME MEDICATIONS: Allergies as of 07/05/2019      Reactions   Bee Venom    Codeine Itching   Tolerates Hydrocodone OK.   Ibuprofen Hives, Other (See Comments)   Happened in childhood   Ibuprofen Hives, Swelling   Metformin And Related Other (See Comments)   Muscle weakness   Morphine Itching, Other (See Comments)   Throat swelling   Penicillins    Childhood Allergy Has patient had a PCN reaction causing immediate rash, facial/tongue/throat swelling, SOB or lightheadedness with hypotension: Unknown Has patient had a PCN reaction causing severe rash involving mucus membranes or skin necrosis: Unknown Has patient had a PCN reaction that required hospitalization: Unknown Has patient had a PCN reaction occurring within the last 10 years: Unknown If all of the above answers are "NO", then may proceed with Cephalosporin use.   Aspirin Hives, Swelling, Rash      Medication List       Accurate as of July 05, 2019  2:32 PM. If you have any questions, ask your nurse or doctor.        atorvastatin 10 MG tablet Commonly known as: LIPITOR Take 1 tablet (10 mg total) by mouth daily. To lower cholesterol   Basaglar KwikPen 100 UNIT/ML Sopn Inject 0.55 mLs (55 Units total) into the skin daily.   cephALEXin 500 MG capsule Commonly known as: KEFLEX Take 1 capsule (500 mg total) by mouth 4 (four) times  daily.   cholecalciferol 1000 units tablet Commonly known as: VITAMIN D Take 2,000 Units by mouth daily.   diclofenac sodium 1 % Gel Commonly known as: VOLTAREN Apply up to 4 times daily to painful joint areas   docusate sodium 100 MG capsule Commonly known as: COLACE Take 2 capsules (200 mg total) by mouth every morning. For stool softner   ferrous sulfate 325 (65 FE) MG tablet Take 1 tablet (325 mg total) by mouth daily with breakfast. For low iron What changed:   how much to take  when to take this  additional instructions   FreeStyle Libre 14 Day Reader Kerrin Mo 1 kit by Does not apply route 4 (four) times daily.  FreeStyle Libre 14 Day Sensor Misc 1 kit by Does not apply route 4 (four) times daily.   gabapentin 300 MG capsule Commonly known as: Neurontin Take one cap qd x 1 day, then one cap bix x 1 day and then one cap tid thereafter   HumaLOG KwikPen 200 UNIT/ML Sopn Generic drug: Insulin Lispro Inject 22 Units into the skin 3 (three) times daily.   hydrOXYzine 25 MG tablet Commonly known as: ATARAX/VISTARIL Take 1 tablet (25 mg total) by mouth 2 (two) times daily.   Insulin Pen Needle 32G X 4 MM Misc Commonly known as: TRUEplus Pen Needles Use as instructed to inject insulin.   levothyroxine 125 MCG tablet Commonly known as: SYNTHROID Take 1 tablet (125 mcg total) by mouth daily before breakfast.   lithium carbonate 300 MG capsule Take 600 mg by mouth at bedtime.   lurasidone 80 MG Tabs tablet Commonly known as: LATUDA Take 80 mg by mouth at bedtime.   methocarbamol 500 MG tablet Commonly known as: Robaxin Take 1 tablet (500 mg total) by mouth 3 (three) times daily as needed for muscle spasms.   methylPREDNISolone 4 MG Tbpk tablet Commonly known as: MEDROL DOSEPAK Take as directed   NovoLOG FlexPen 100 UNIT/ML FlexPen Generic drug: insulin aspart INJECT 22 UNITS INTO THE SKIN 3 TIMES A DAY WITH MEALS.   nystatin cream Commonly known as:  MYCOSTATIN Apply 1 application topically 2 (two) times daily. To affected skin areas   omeprazole 40 MG capsule Commonly known as: PRILOSEC Take 1 capsule (40 mg total) by mouth 2 (two) times daily.   pioglitazone 30 MG tablet Commonly known as: Actos Take 1 tablet (30 mg total) by mouth daily.   prazosin 1 MG capsule Commonly known as: MINIPRESS Take 1 capsule (1 mg total) by mouth at bedtime and may repeat dose one time if needed. For sleep/nightmares   propranolol 40 MG tablet Commonly known as: INDERAL Take 1 tablet (40 mg total) by mouth 2 (two) times daily.   tolterodine 4 MG 24 hr capsule Commonly known as: DETROL LA Take 1 capsule (4 mg total) by mouth daily.   traMADol 50 MG tablet Commonly known as: ULTRAM Take 1 tablet (50 mg total) by mouth every 6 (six) hours as needed.        OBJECTIVE:   Vital Signs: BP 132/68 (BP Location: Right Arm, Patient Position: Sitting, Cuff Size: Large)   Pulse 80   Temp 98.6 F (37 C)   Ht '5\' 10"'$  (1.778 m)   Wt 296 lb 12.8 oz (134.6 kg)   LMP 09/20/2011   SpO2 98%   BMI 42.59 kg/m   Wt Readings from Last 3 Encounters:  07/05/19 296 lb 12.8 oz (134.6 kg)  06/11/19 290 lb (131.5 kg)  05/27/19 280 lb 6.4 oz (127.2 kg)     Exam: General: Pt appears well and is in NAD  Lungs: Clear with good BS bilat with no rales, rhonchi, or wheezes  Heart: RRR with normal S1 and S2 and no gallops; no murmurs; no rub  Abdomen: Normoactive bowel sounds, soft, nontender, without masses or organomegaly palpable  Extremities: No pretibial edema.   Skin: Normal texture and temperature to palpation. No rash noted  Neuro: MS is good with appropriate affect, pt is alert and Ox3   DM foot exam:07/05/2019 The skin of the feet is intact without sores or ulcerations. The pedal pulses are 2+ on right and 2+ on left. The sensation is intact to a  screening 5.07, 10 gram monofilament bilaterally    DATA REVIEWED: Results for CRISTINA, CENICEROS (MRN [id number]) as of 07/05/2019 16:31  Ref. Range 05/27/2019 11:14 07/05/2019 14:25  Hemoglobin A1C Latest Ref Range: 4.0 - 5.6 % 13.2 (A) 8.3 (A)     Lab Results  Component Value Date   LDLCALC 105 (H) 02/28/2019   CREATININE 0.86 02/28/2019     ASSESSMENT / PLAN / RECOMMENDATIONS:   1) Type 2 Diabetes Mellitus, Poorly Controlled, Without complications - Most recent A1c of 8.3 %. Goal A1c < 7.0 %.    - I have congratulated her on the marriage. She has been doing well with taking charge of her diabetes, dramatic improvement in glucose control within a short period of time.  - We didn't complete the DMV papers today , because she has not been checking glucose at home and we have no records, so she will work on that until her next visit in March. - She continues to avoid  Sugar-sweetened beverages.  - Will decrease basal insulin due to tight fasting BG's at 79 mg/dL   MEDICATIONS:  Lantus  50 units   Novolog   22 units TIDQAC  Pioglitazone 30 mg daily   EDUCATION / INSTRUCTIONS:  BG monitoring instructions: Patient is instructed to check her blood sugars 4 times a day,before meals and bedtime.  Call [facility] if: BG persistently < 70 or > 300. . I reviewed the Rule of 15 for the treatment of hypoglycemia in detail with the patient. Literature supplied.   F/U in 3 months     Signed electronically by: Mack Guise, MD  [facility]  [address] [place], [place] [address] Phone: [phone] FAX: [phone]   CC: Antony Blackbird, MD [place] [address] Phone: [phone]  Fax: [phone]  Return to Endocrinology clinic as below: Future Appointments  Date Time Provider [place]  08/26/2019 11:10 AM Luken Shadowens, Melanie Crazier, MD [facility] None

## 2019-07-05 NOTE — Patient Instructions (Addendum)
Decrease lantus  50 units daily  Novolog  22 units with each meal  Actos 30 mg , 1 tablet daily      HOW TO TREAT LOW BLOOD SUGARS (Blood sugar LESS THAN 70 MG/DL)  Please follow the RULE OF 15 for the treatment of hypoglycemia treatment (when your (blood sugars are less than 70 mg/dL)    STEP 1: Take 15 grams of carbohydrates when your blood sugar is low, which includes:   3-4 GLUCOSE TABS  OR  3-4 OZ OF JUICE OR REGULAR SODA OR  ONE TUBE OF GLUCOSE GEL     STEP 2: RECHECK blood sugar in 15 MINUTES STEP 3: If your blood sugar is still low at the 15 minute recheck --> then, go back to STEP 1 and treat AGAIN with another 15 grams of carbohydrates.;e

## 2019-07-18 ENCOUNTER — Other Ambulatory Visit: Payer: Self-pay | Admitting: Family Medicine

## 2019-07-19 ENCOUNTER — Other Ambulatory Visit: Payer: Self-pay

## 2019-07-19 MED ORDER — NOVOLOG FLEXPEN 100 UNIT/ML ~~LOC~~ SOPN: PEN_INJECTOR | SUBCUTANEOUS | 3 refills | Status: DC

## 2019-08-08 ENCOUNTER — Telehealth: Payer: Self-pay | Admitting: Family Medicine

## 2019-08-08 NOTE — Telephone Encounter (Signed)
Pt would like a letter for the unemployment offices, she would like the letter to state her medical conditions and state she is at high risk to get Covid-19 to to those issues. please complete if possible and call for pickup

## 2019-08-12 ENCOUNTER — Encounter (HOSPITAL_COMMUNITY): Payer: Self-pay

## 2019-08-12 ENCOUNTER — Other Ambulatory Visit: Payer: Self-pay

## 2019-08-12 ENCOUNTER — Ambulatory Visit (HOSPITAL_COMMUNITY)
Admission: EM | Admit: 2019-08-12 | Discharge: 2019-08-12 | Disposition: A | Discharge status: Home / Self Care | Payer: No Typology Code available for payment source | Attending: Family Medicine | Admitting: Family Medicine

## 2019-08-12 DIAGNOSIS — M545 Low back pain, unspecified: Secondary | ICD-10-CM

## 2019-08-12 DIAGNOSIS — G8929 Other chronic pain: Secondary | ICD-10-CM

## 2019-08-12 DIAGNOSIS — M25562 Pain in left knee: Secondary | ICD-10-CM

## 2019-08-12 DIAGNOSIS — S161XXA Strain of muscle, fascia and tendon at neck level, initial encounter: Secondary | ICD-10-CM

## 2019-08-12 MED ORDER — METHOCARBAMOL 500 MG PO TABS: 500.0000 mg | ORAL_TABLET | Freq: Three times a day (TID) | ORAL | 0 refills | Status: DC | PRN

## 2019-08-12 NOTE — Discharge Instructions (Signed)
Light and regular activity as tolerated.  See exercises provided.  Heat application while active can help with muscle spasms to neck and back. Sleep with pillow under your knees.   Ice application to your knee and use of your voltaren gel to your knee.  Muscle relaxer as needed, tylenol regularly.  May take even up to the next 2-3 weeks for symptoms to improve.  Continue to follow with your primary care provider if symptoms persist as may need further evaluation or management.
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
## 2019-08-13 ENCOUNTER — Encounter: Payer: Self-pay | Admitting: Family Medicine

## 2019-08-13 NOTE — Progress Notes (Signed)
Patient ID: Lynn Martinez, female   DOB: 1971/01/11, 50 y.o.   MRN: [id number]   Message received that patient would like a letter detailing her chronic medical issues and that she is at increased risk for COVID-19.

## 2019-08-13 NOTE — Telephone Encounter (Signed)
Letter has been printed and will be left on your desk to contact patient regarding pickup.

## 2019-08-14 NOTE — Telephone Encounter (Signed)
Left voicemail.  Patient letter at the front desk.

## 2019-08-26 ENCOUNTER — Ambulatory Visit: Payer: Self-pay | Admitting: Internal Medicine

## 2019-08-28 ENCOUNTER — Other Ambulatory Visit: Payer: Self-pay | Admitting: Family Medicine

## 2019-08-28 DIAGNOSIS — R32 Unspecified urinary incontinence: Secondary | ICD-10-CM

## 2019-09-02 ENCOUNTER — Ambulatory Visit: Payer: Self-pay | Admitting: Internal Medicine

## 2019-09-19 ENCOUNTER — Other Ambulatory Visit: Payer: Self-pay

## 2019-09-19 ENCOUNTER — Encounter: Payer: Self-pay | Admitting: Internal Medicine

## 2019-09-19 ENCOUNTER — Ambulatory Visit (INDEPENDENT_AMBULATORY_CARE_PROVIDER_SITE_OTHER): Payer: No Typology Code available for payment source | Admitting: Internal Medicine

## 2019-09-19 VITALS — BP 118/64 | HR 65 | Temp 98.4°F | Ht 70.0 in | Wt 317.4 lb

## 2019-09-19 DIAGNOSIS — Z794 Long term (current) use of insulin: Secondary | ICD-10-CM

## 2019-09-19 DIAGNOSIS — E119 Type 2 diabetes mellitus without complications: Secondary | ICD-10-CM

## 2019-09-19 LAB — POCT GLYCOSYLATED HEMOGLOBIN (HGB A1C): Hemoglobin A1C: 5.9 % — AB (ref 4.0–5.6)

## 2019-09-19 NOTE — Patient Instructions (Addendum)
Decrease lantus  40 units daily  Continue Novolog  22 units with each meal  Continue Actos  30 mg , 1 tablet daily      HOW TO TREAT LOW BLOOD SUGARS (Blood sugar LESS THAN 70 MG/DL)  Please follow the RULE OF 15 for the treatment of hypoglycemia treatment (when your (blood sugars are less than 70 mg/dL)    STEP 1: Take 15 grams of carbohydrates when your blood sugar is low, which includes:   3-4 GLUCOSE TABS  OR  3-4 OZ OF JUICE OR REGULAR SODA OR  ONE TUBE OF GLUCOSE GEL     STEP 2: RECHECK blood sugar in 15 MINUTES STEP 3: If your blood sugar is still low at the 15 minute recheck --> then, go back to STEP 1 and treat AGAIN with another 15 grams of carbohydrates.;e

## 2019-09-19 NOTE — Progress Notes (Addendum)
Name: Lynn Martinez  Age/ Sex: 49 y.o., female   MRN/ DOB: [id number], 27-Sep-1970     PCP: Antony Blackbird, MD   Reason for Endocrinology Evaluation: Type 2 Diabetes Mellitus  Initial Endocrine Consultative Visit: 12/20    PATIENT IDENTIFIER: Lynn Martinez is a 49 y.o. female with a past medical history of T2DM, hypothyroidism, bipolar disorder and HTN . The patient has followed with Endocrinology clinic since 06/01/18 for consultative assistance with management of her diabetes.  DIABETIC HISTORY:  Lynn Martinez was diagnosed with T2DM ~ 2019, and started insulin therapy shortly after the diagnosis. She has tried Glipizide and Januvia in the past.  Her hemoglobin A1c has ranged from 9.1%  in 2017, peaking at 11.1% in 2019.  SUBJECTIVE:   During the last visit (07/05/2019): A1c was 8.3 % we continued lantus, novolog and pioglitazone.    Today (09/20/2019): Lynn Martinez is here for a  follow up on her diabetes management . She just started  checking her glucose a few days ago. She is back to taking her insulin and actos again. She continues to use the health department. She was provided with patient assistance program papers and stated she dropped them off but there's no documentation in the chart and we don;t have them. Otherwise, the patient has not required any recent emergency interventions for hypoglycemia and has not had recent hospitalizations secondary to hyper or hypoglycemic episodes.    She got married to [employer] (Gene) on 06/14/2019    ROS: As per HPI and as detailed below: Review of Systems  Gastrointestinal: Negative for diarrhea and nausea.  Neurological: Negative for tingling and tremors.       Tingling in the hands      HOME DIABETES REGIMEN:  Lantus 55 units  Novolog 22 units QAC  Actos 30 mg daily     GLUCOSE LOG:       DIABETIC COMPLICATIONS: Microvascular complications:    Denies: neuropathy, nephropathy, retinopathy  Last  eye exam: Completed 06/2019  Macrovascular complications:    Denies: CAD, PVD, CVA   HISTORY:  Past Medical History:  Past Medical History:  Diagnosis Date  . Anemia   . Anxiety   . Arthritis    "knees" (09/30/2015)  . Bipolar disorder ([place])   . Depression   . Diabetes mellitus without complication ([place])   . Fibroid    s/p myomectomy 2010, hysterectomy 2013  . Grave's disease   . Heart murmur   . Hormone disorder   . Hypertension   . Hypothyroidism   . 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 Migraine     otc med prn  . PCOS (polycystic ovarian syndrome)   . PMS (premenstrual syndrome)   . PTSD (post-traumatic stress disorder)   . Seasonal allergies   . Thyroid cancer ([place]) 2005  . Thyroid disease   . Type II diabetes mellitus ([place])   . Vertigo    Past Surgical History:  Past Surgical History:  Procedure Laterality Date  . ABDOMINAL HYSTERECTOMY  10/11/2011   Procedure: HYSTERECTOMY ABDOMINAL;  Surgeon: Terrance Mass, MD;  Location: [facility];  Service: Gynecology;  Laterality: N/A;  With Repair of serosa.  . ABDOMINAL HYSTERECTOMY    . CHOLECYSTECTOMY N/A 09/06/2013   Procedure: LAPAROSCOPIC CHOLECYSTECTOMY WITH INTRAOPERATIVE CHOLANGIOGRAM;  Surgeon: Merrie Roof, MD;  Location: [facility];  Service: General;  Laterality: N/A;  . DIAGNOSTIC LAPAROSCOPY    .  DILATION AND CURETTAGE OF UTERUS    . HERNIA REPAIR    . MYOMECTOMY  2010   LAPAROSCOPY  . thyroid removed    . TOTAL THYROIDECTOMY  2006  . UMBILICAL HERNIA REPAIR  1982    Social History:  reports that she has never smoked. She has never used smokeless tobacco. She reports that she does not drink alcohol or use drugs. Family History:  Family History  Problem Relation Age of Onset  . Thyroid disease Mother   . Diabetes Father   . Hypertension Father   . Heart disease Father   . Heart attack Father   . Cancer Maternal Aunt        BRAIN  . Cancer Maternal Grandmother         LYMPHOMA  . Hypertension Maternal Grandmother   . Cancer Paternal Grandmother        PANCREATIC  . Diabetes Paternal Grandmother   . Hypertension Paternal Grandmother   . Hypertension Maternal Grandfather   . Diabetes Maternal Grandfather   . Hypertension Paternal Grandfather   . Pancreatic cancer Paternal Grandfather      HOME MEDICATIONS: Allergies as of 09/19/2019      Reactions   Bee Venom    Codeine Itching   Tolerates Hydrocodone OK.   Ibuprofen Hives, Other (See Comments)   Happened in childhood   Ibuprofen Hives, Swelling   Metformin And Related Other (See Comments)   Muscle weakness   Morphine Itching, Other (See Comments)   Throat swelling   Penicillins    Childhood Allergy Has patient had a PCN reaction causing immediate rash, facial/tongue/throat swelling, SOB or lightheadedness with hypotension: Unknown Has patient had a PCN reaction causing severe rash involving mucus membranes or skin necrosis: Unknown Has patient had a PCN reaction that required hospitalization: Unknown Has patient had a PCN reaction occurring within the last 10 years: Unknown If all of the above answers are "NO", then may proceed with Cephalosporin use.   Aspirin Hives, Swelling, Rash      Medication List       Accurate as of September 19, 2019 11:59 PM. If you have any questions, ask your nurse or doctor.        atorvastatin 10 MG tablet Commonly known as: LIPITOR Take 1 tablet (10 mg total) by mouth daily. To lower cholesterol   cholecalciferol 1000 units tablet Commonly known as: VITAMIN D Take 2,000 Units by mouth daily.   Detrol LA 4 MG 24 hr capsule Generic drug: tolterodine TAKE 1 CAPSULE ('4MG'$  TOTAL) BY MOUTH DAILY.   diclofenac Sodium 1 % Gel Commonly known as: VOLTAREN Apply topically.   diclofenac sodium 1 % Gel Commonly known as: VOLTAREN Apply up to 4 times daily to painful joint areas   FreeStyle Libre 14 Day Sensor Misc 1 kit by Does not apply route 4 (four)  times daily.   gabapentin 300 MG capsule Commonly known as: Neurontin Take one cap qd x 1 day, then one cap bix x 1 day and then one cap tid thereafter   hydrOXYzine 25 MG tablet Commonly known as: ATARAX/VISTARIL Take 1 tablet (25 mg total) by mouth 2 (two) times daily.   insulin glargine 100 UNIT/ML injection Commonly known as: LANTUS Inject 50 Units into the skin daily.   Insulin Pen Needle 32G X 4 MM Misc Commonly known as: TRUEplus Pen Needles Use as instructed to inject insulin.   levothyroxine 125 MCG tablet Commonly known as: SYNTHROID Take 1 tablet (125  mcg total) by mouth daily before breakfast.   lithium carbonate 450 MG CR tablet Commonly known as: ESKALITH Take 450 mg by mouth 2 (two) times daily.   lithium carbonate 300 MG capsule Take 600 mg by mouth at bedtime.   lurasidone 80 MG Tabs tablet Commonly known as: LATUDA Take 80 mg by mouth at bedtime.   methocarbamol 500 MG tablet Commonly known as: Robaxin Take 1 tablet (500 mg total) by mouth 3 (three) times daily as needed for muscle spasms.   methylPREDNISolone 4 MG Tbpk tablet Commonly known as: MEDROL DOSEPAK Take as directed   NovoLOG FlexPen 100 UNIT/ML FlexPen Generic drug: insulin aspart INJECT 22 UNITS INTO THE SKIN 3 TIMES A DAY WITH MEALS.   nystatin cream Commonly known as: MYCOSTATIN Apply 1 application topically 2 (two) times daily. To affected skin areas   omeprazole 40 MG capsule Commonly known as: PRILOSEC Take 1 capsule (40 mg total) by mouth 2 (two) times daily.   pioglitazone 30 MG tablet Commonly known as: Actos Take 1 tablet (30 mg total) by mouth daily.   prazosin 1 MG capsule Commonly known as: MINIPRESS Take 1 capsule (1 mg total) by mouth at bedtime and may repeat dose one time if needed. For sleep/nightmares   propranolol 40 MG tablet Commonly known as: INDERAL Take 1 tablet (40 mg total) by mouth 2 (two) times daily.   traMADol 50 MG tablet Commonly known as:  ULTRAM Take 1 tablet (50 mg total) by mouth every 6 (six) hours as needed.        OBJECTIVE:   Vital Signs: BP 118/64 (BP Location: Left Arm, Patient Position: Sitting, Cuff Size: Large)   Pulse 65   Temp 98.4 F (36.9 C)   Ht '5\' 10"'$  (1.778 m)   Wt (!) 317 lb 6.4 oz (144 kg)   LMP 09/20/2011   SpO2 98%   BMI 45.54 kg/m   Wt Readings from Last 3 Encounters:  09/19/19 (!) 317 lb 6.4 oz (144 kg)  07/05/19 296 lb 12.8 oz (134.6 kg)  06/11/19 290 lb (131.5 kg)     Exam: General: Pt appears well and is in NAD  Lungs: Clear with good BS bilat with no rales, rhonchi, or wheezes  Heart: RRR with normal S1 and S2 and no gallops; no murmurs; no rub  Extremities: No pretibial edema.   Skin: Normal texture and temperature to palpation. No rash noted  Neuro: MS is good with appropriate affect, pt is alert and Ox3   DM foot exam:07/05/2019 The skin of the feet is intact without sores or ulcerations. The pedal pulses are 2+ on right and 2+ on left. The sensation is intact to a screening 5.07, 10 gram monofilament bilaterally    DATA REVIEWED: Results for SHARNICE, BOSLER (MRN [id number]) as of 07/05/2019 16:31  Ref. Range 05/27/2019 11:14 07/05/2019 14:25  Hemoglobin A1C Latest Ref Range: 4.0 - 5.6 % 13.2 (A) 8.3 (A)     Lab Results  Component Value Date   LDLCALC 105 (H) 02/28/2019   CREATININE 0.86 02/28/2019     ASSESSMENT / PLAN / RECOMMENDATIONS:   1) Type 2 Diabetes Mellitus, Optimally Controlled, Without complications - Most recent A1c of 5.9 %. Goal A1c < 7.0 %.     - Despite her low A1c , there's no evidence of hypoglycemia but has a lot of tight BG readings.  - I have congratulated her on the great compliance with BG readings and insulin intake.  -  Will decrease basal insulin due to tight BG's  - We did discuss add-on therapy to help with weight loss. We discussed both GLP-1 and SGLT- 2 inhibitors, we discussed risks and benefits to each, pt opted to  continue the current regimen for now - I also signed her DMV paper work today   MEDICATIONS:  Decrease Lantus to 40 units   Continue Novolog  22 units TIDQAC  Continue Pioglitazone 30 mg daily   EDUCATION / INSTRUCTIONS:  BG monitoring instructions: Patient is instructed to check her blood sugars 4 times a day,before meals and bedtime.  Call [facility] if: BG persistently < 70 or > 300. . I reviewed the Rule of 15 for the treatment of hypoglycemia in detail with the patient. Literature supplied.   F/U in 3 months     Signed electronically by: Mack Guise, MD  [facility]  [address] [place], [place] [address] Phone: [phone] FAX: [phone]   CC: Antony Blackbird, MD [place] [address] Phone: [phone]  Fax: [phone]  Return to Endocrinology clinic as below: Future Appointments  Date Time Provider [place]  01/30/2020  7:30 AM Isamar Nazir, Melanie Crazier, MD [id number] None

## 2019-10-02 ENCOUNTER — Other Ambulatory Visit: Payer: Self-pay | Admitting: Family Medicine

## 2019-10-02 DIAGNOSIS — E890 Postprocedural hypothyroidism: Secondary | ICD-10-CM

## 2019-10-03 ENCOUNTER — Other Ambulatory Visit: Payer: Self-pay | Admitting: Pharmacist

## 2019-10-03 DIAGNOSIS — E890 Postprocedural hypothyroidism: Secondary | ICD-10-CM

## 2019-10-03 MED ORDER — LEVOTHYROXINE SODIUM 125 MCG PO TABS: 125.0000 ug | ORAL_TABLET | Freq: Every day | ORAL | 0 refills | Status: DC

## 2019-10-09 ENCOUNTER — Other Ambulatory Visit: Payer: Self-pay | Admitting: Internal Medicine

## 2019-10-17 ENCOUNTER — Encounter (HOSPITAL_COMMUNITY): Payer: Self-pay

## 2019-10-17 ENCOUNTER — Other Ambulatory Visit: Payer: Self-pay

## 2019-10-17 ENCOUNTER — Ambulatory Visit (HOSPITAL_COMMUNITY)
Admission: EM | Admit: 2019-10-17 | Discharge: 2019-10-17 | Disposition: A | Discharge status: Home / Self Care | Payer: HRSA Program | Attending: Internal Medicine | Admitting: Internal Medicine

## 2019-10-17 DIAGNOSIS — Z20822 Contact with and (suspected) exposure to covid-19: Secondary | ICD-10-CM | POA: Insufficient documentation

## 2019-10-17 DIAGNOSIS — B349 Viral infection, unspecified: Secondary | ICD-10-CM | POA: Insufficient documentation

## 2019-10-17 NOTE — ED Triage Notes (Signed)
Pt c/o headaches, nausea and fever and some back pain. Pt states this started yesterday.

## 2019-10-18 LAB — SARS CORONAVIRUS 2 (TAT 6-24 HRS): SARS Coronavirus 2: NEGATIVE

## 2019-10-18 NOTE — ED Provider Notes (Signed)
[place]    CSN: [id number] Arrival date & time: 10/17/19  0850      History   Chief Complaint Chief Complaint  Patient presents with  . Headache  . Nausea    HPI Lynn Martinez is a 49 y.o. female comes to the urgent care with 1 day history of headaches, nausea, fever and back pain.  Symptoms started insidiously and is gotten progressively worse.  Headache is global with no known aggravating or relieving factors.  He denies any nausea or vomiting.  No chills.  No sick contacts.  No diarrhea.  No dysuria urgency or frequency.Marland Kitchen   HPI  Past Medical History:  Diagnosis Date  . Anemia   . Anxiety   . Arthritis    "knees" (09/30/2015)  . Bipolar disorder ([place])   . Depression   . Diabetes mellitus without complication ([place])   . Fibroid    s/p myomectomy 2010, hysterectomy 2013  . Grave's disease   . Heart murmur   . Hormone disorder   . Hypertension   . Hypothyroidism   . 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 Migraine     otc med prn  . PCOS (polycystic ovarian syndrome)   . PMS (premenstrual syndrome)   . PTSD (post-traumatic stress disorder)   . Seasonal allergies   . Thyroid cancer ([place]) 2005  . Thyroid disease   . Type II diabetes mellitus ([place])   . Vertigo     Patient Active Problem List   Diagnosis Date Noted  . Screening breast examination 06/11/2019  . Breast pain, left 06/11/2019  . Radiculopathy, cervical region 11/21/2018  . Type 2 diabetes mellitus without complication, with long-term current use of insulin ([place]) 10/31/2018  . Carpal tunnel syndrome, right upper limb 10/08/2018  . Carpal tunnel syndrome, left upper limb 10/08/2018  . Pain in the chest 09/30/2015  . Hypertension 09/30/2015  . Type 2 diabetes mellitus with hyperglycemia, with long-term current use of insulin ([place]) 09/30/2015  . Chest pain 09/30/2015  . Cholelithiasis with acute cholecystitis 09/05/2013  . Borderline behavior  03/14/2012    Class: Chronic  . OCD (obsessive compulsive disorder) 03/14/2012    Class: Chronic  . PTSD (post-traumatic stress disorder) 03/14/2012    Class: Chronic  . 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 Lumbago without sciatica 03/12/2012  . Major depression, recurrent ([place]) 03/08/2012    Class: Chronic  . Snoring 12/02/2011  . PCOS (polycystic ovarian syndrome) 09/05/2011  . Hypothyroidism 08/13/2010  . VITAMIN D DEFICIENCY 08/13/2010  . ANEMIA-NOS 08/13/2010    Past Surgical History:  Procedure Laterality Date  . ABDOMINAL HYSTERECTOMY  10/11/2011   Procedure: HYSTERECTOMY ABDOMINAL;  Surgeon: Terrance Mass, MD;  Location: [facility];  Service: Gynecology;  Laterality: N/A;  With Repair of serosa.  . ABDOMINAL HYSTERECTOMY    . CHOLECYSTECTOMY N/A 09/06/2013   Procedure: LAPAROSCOPIC CHOLECYSTECTOMY WITH INTRAOPERATIVE CHOLANGIOGRAM;  Surgeon: Merrie Roof, MD;  Location: [facility];  Service: General;  Laterality: N/A;  . DIAGNOSTIC LAPAROSCOPY    . DILATION AND CURETTAGE OF UTERUS    . HERNIA REPAIR    . MYOMECTOMY  2010   LAPAROSCOPY  . thyroid removed    . TOTAL THYROIDECTOMY  2006  . UMBILICAL HERNIA REPAIR  1982    OB History    Gravida  2   Para  0   Term  0  Preterm  0   AB  2   Living        SAB  2   TAB  0   Ectopic  0   Multiple      Live Births  0            Home Medications    Prior to Admission medications   Medication Sig Start Date End Date Taking? Authorizing Provider  atorvastatin (LIPITOR) 10 MG tablet Take 1 tablet (10 mg total) by mouth daily. To lower cholesterol 03/08/19   Fulp, Cammie, MD  cholecalciferol (VITAMIN D) 1000 UNITS tablet Take 2,000 Units by mouth daily.  03/14/12   Darrol Jump, MD  DETROL LA 4 MG 24 hr capsule TAKE 1 CAPSULE (4MG  TOTAL) BY MOUTH DAILY. 08/29/19   Fulp, Cammie, MD  diclofenac sodium (VOLTAREN) 1 % GEL Apply up to 4 times daily to painful joint areas  10/03/18   Fulp, Cammie, MD  diclofenac Sodium (VOLTAREN) 1 % GEL Apply topically.    [provider]  gabapentin (NEURONTIN) 300 MG capsule Take one cap qd x 1 day, then one cap bix x 1 day and then one cap tid thereafter 11/21/18   Aundra Dubin, PA-C  hydrOXYzine (ATARAX/VISTARIL) 25 MG tablet Take 1 tablet (25 mg total) by mouth 2 (two) times daily. 04/13/18   Fulp, Cammie, MD  insulin glargine (LANTUS) 100 UNIT/ML injection Inject 40 Units into the skin daily.    [provider]  Insulin Pen Needle (TRUEPLUS PEN NEEDLES) 32G X 4 MM MISC Use as instructed to inject insulin. 04/25/18   Fulp, Ander Gaster, MD  levothyroxine (SYNTHROID) 125 MCG tablet Take 1 tablet (125 mcg total) by mouth daily before breakfast. Must have office visit for refills 10/03/19   Fulp, Cammie, MD  lithium carbonate (ESKALITH) 450 MG CR tablet Take 450 mg by mouth 2 (two) times daily. 07/18/19   [provider]  lithium carbonate 300 MG capsule Take 600 mg by mouth at bedtime.     [provider]  lurasidone (LATUDA) 80 MG TABS tablet Take 80 mg by mouth at bedtime.    [provider]  methocarbamol (ROBAXIN) 500 MG tablet Take 1 tablet (500 mg total) by mouth 3 (three) times daily as needed for muscle spasms. 08/12/19   Burky, Malachy Moan, NP  NOVOLOG FLEXPEN 100 UNIT/ML FlexPen INJECT 22 UNITS INTO THE SKIN 3 TIMES A DAY WITH MEALS. 10/09/19   Shamleffer, Melanie Crazier, MD  omeprazole (PRILOSEC) 40 MG capsule Take 1 capsule (40 mg total) by mouth 2 (two) times daily. 07/03/19   Fulp, Cammie, MD  pioglitazone (ACTOS) 30 MG tablet Take 1 tablet (30 mg total) by mouth daily. 05/22/19   Shamleffer, Melanie Crazier, MD  prazosin (MINIPRESS) 1 MG capsule Take 1 capsule (1 mg total) by mouth at bedtime and may repeat dose one time if needed. For sleep/nightmares 03/14/12   Darrol Jump, MD  propranolol (INDERAL) 40 MG tablet Take 1 tablet (40 mg total) by mouth 2 (two) times daily. 10/03/18    Fulp, Cammie, MD  traMADol (ULTRAM) 50 MG tablet Take 1 tablet (50 mg total) by mouth every 6 (six) hours as needed. 10/08/18   Aundra Dubin, PA-C    Family History Family History  Problem Relation Age of Onset  . Thyroid disease Mother   . Diabetes Father   . Hypertension Father   . Heart disease Father   . Heart attack Father   .  Cancer Maternal Aunt        BRAIN  . Cancer Maternal Grandmother        LYMPHOMA  . Hypertension Maternal Grandmother   . Cancer Paternal Grandmother        PANCREATIC  . Diabetes Paternal Grandmother   . Hypertension Paternal Grandmother   . Hypertension Maternal Grandfather   . Diabetes Maternal Grandfather   . Hypertension Paternal Grandfather   . Pancreatic cancer Paternal Grandfather     Social History Social History   Tobacco Use  . Smoking status: Never Smoker  . Smokeless tobacco: Never Used  Substance Use Topics  . Alcohol use: Never  . Drug use: Never     Allergies   Bee venom, Codeine, Ibuprofen, Ibuprofen, Metformin and related, Morphine, Penicillins, and Aspirin   Review of Systems Review of Systems  Constitutional: Positive for activity change, chills and fever. Negative for fatigue.  HENT: Negative for congestion, postnasal drip and sore throat.   Respiratory: Negative.   Gastrointestinal: Positive for nausea. Negative for abdominal pain, diarrhea and vomiting.  Genitourinary: Negative for dysuria, frequency and urgency.  Musculoskeletal: Negative for arthralgias and myalgias.  Skin: Negative.   Neurological: Positive for headaches. Negative for dizziness and light-headedness.  Psychiatric/Behavioral: Negative for confusion.     Physical Exam Triage Vital Signs ED Triage Vitals  Enc Vitals Group     BP 10/17/19 0925 (!) 144/78     Pulse Rate 10/17/19 0925 62     Resp 10/17/19 0925 18     Temp 10/17/19 0925 98.6 F (37 C)     Temp Source 10/17/19 0925 Oral     SpO2 10/17/19 0925 97 %     Weight 10/17/19  0916 300 lb (136.1 kg)     Height --      Head Circumference --      Peak Flow --      Pain Score 10/17/19 0916 7     Pain Loc --      Pain Edu? --      Excl. in [place]? --    No data found.  Updated Vital Signs BP (!) 144/78 (BP Location: Right Arm)   Pulse 62   Temp 98.6 F (37 C) (Oral)   Resp 18   Wt 136.1 kg   LMP 09/20/2011   SpO2 97%   BMI 43.05 kg/m   Visual Acuity Right Eye Distance:   Left Eye Distance:   Bilateral Distance:    Right Eye Near:   Left Eye Near:    Bilateral Near:     Physical Exam Vitals and nursing note reviewed.  Constitutional:      General: She is not in acute distress.    Appearance: She is not ill-appearing.  Cardiovascular:     Rate and Rhythm: Normal rate and regular rhythm.     Heart sounds: Normal heart sounds. No murmur. No friction rub.  Pulmonary:     Effort: Pulmonary effort is normal. No respiratory distress.     Breath sounds: Normal breath sounds. No wheezing or rhonchi.  Abdominal:     General: Bowel sounds are normal. There is no distension.     Palpations: Abdomen is soft. There is no mass.  Neurological:     Mental Status: She is alert.     GCS: GCS eye subscore is 4. GCS verbal subscore is 5. GCS motor subscore is 6.      UC Treatments / Results  Labs (all labs ordered are  listed, but only abnormal results are displayed) Labs Reviewed  SARS CORONAVIRUS 2 (TAT 6-24 HRS)    EKG   Radiology No results found.  Procedures Procedures (including critical care time)  Medications Ordered in UC Medications - No data to display  Initial Impression / Assessment and Plan / UC Course  I have reviewed the triage vital signs and the nursing notes.  Pertinent labs & imaging results that were available during my care of the patient were reviewed by me and considered in my medical decision making (see chart for details).    1.  Viral syndrome: COVID-19 PCR has been sent Patient is advised to quarantine until  COVID-19 test results are available Over-the-counter Tylenol/Motrin as needed for pain/fever Return precautions given. Final Clinical Impressions(s) / UC Diagnoses   Final diagnoses:  Viral illness   Discharge Instructions   None    ED Prescriptions    None     PDMP not reviewed this encounter.   Chase Picket, MD 10/18/19 [phone]

## 2019-11-01 ENCOUNTER — Other Ambulatory Visit: Payer: Self-pay

## 2019-11-01 ENCOUNTER — Telehealth: Payer: Self-pay | Admitting: Internal Medicine

## 2019-11-01 NOTE — Telephone Encounter (Signed)
Call was dropped and I did not get much information - patient states her blood sugar has been high and would like dr/nurse to give her a call to be advised. [phone]

## 2019-11-01 NOTE — Telephone Encounter (Signed)
Lft vm to return call to discuss readings

## 2019-11-04 ENCOUNTER — Other Ambulatory Visit: Payer: Self-pay

## 2019-11-04 ENCOUNTER — Ambulatory Visit (INDEPENDENT_AMBULATORY_CARE_PROVIDER_SITE_OTHER): Payer: Self-pay | Admitting: Internal Medicine

## 2019-11-04 ENCOUNTER — Encounter: Payer: Self-pay | Admitting: Internal Medicine

## 2019-11-04 ENCOUNTER — Other Ambulatory Visit: Payer: Self-pay | Admitting: Family Medicine

## 2019-11-04 VITALS — BP 124/82 | HR 64 | Temp 98.5°F | Ht 70.0 in | Wt 317.2 lb

## 2019-11-04 DIAGNOSIS — Z794 Long term (current) use of insulin: Secondary | ICD-10-CM

## 2019-11-04 DIAGNOSIS — E119 Type 2 diabetes mellitus without complications: Secondary | ICD-10-CM

## 2019-11-04 DIAGNOSIS — E890 Postprocedural hypothyroidism: Secondary | ICD-10-CM

## 2019-11-04 LAB — GLUCOSE, POCT (MANUAL RESULT ENTRY): POC Glucose: 159 mg/dl — AB (ref 70–99)

## 2019-11-04 MED ORDER — LANTUS SOLOSTAR 100 UNIT/ML ~~LOC~~ SOPN: 38.0000 [IU] | PEN_INJECTOR | Freq: Every day | SUBCUTANEOUS | 11 refills | Status: DC

## 2019-11-04 NOTE — Patient Instructions (Addendum)
Decrease lantus  38  units daily  Continue Novolog  22 units with each meal  Continue Actos  30 mg  1 tablet daily      HOW TO TREAT LOW BLOOD SUGARS (Blood sugar LESS THAN 70 MG/DL)  Please follow the RULE OF 15 for the treatment of hypoglycemia treatment (when your (blood sugars are less than 70 mg/dL)    STEP 1: Take 15 grams of carbohydrates when your blood sugar is low, which includes:   3-4 GLUCOSE TABS  OR  3-4 OZ OF JUICE OR REGULAR SODA OR  ONE TUBE OF GLUCOSE GEL     STEP 2: RECHECK blood sugar in 15 MINUTES STEP 3: If your blood sugar is still low at the 15 minute recheck --> then, go back to STEP 1 and treat AGAIN with another 15 grams of carbohydrates.;

## 2019-11-04 NOTE — Progress Notes (Signed)
Name: Lynn Martinez  Age/ Sex: 49 y.o., female   MRN/ DOB: [id number], 1971-01-25     PCP: Antony Blackbird, MD   Reason for Endocrinology Evaluation: Type 2 Diabetes Mellitus  Initial Endocrine Consultative Visit: 12/20    PATIENT IDENTIFIER: Lynn Martinez is a 49 y.o. female with a past medical history of T2DM, hypothyroidism, bipolar disorder and HTN . The patient has followed with Endocrinology clinic since 06/01/18 for consultative assistance with management of her diabetes.  DIABETIC HISTORY:  [name] was diagnosed with T2DM ~ 2019, and started insulin therapy shortly after the diagnosis. She has tried Glipizide and Januvia in the past.  Her hemoglobin A1c has ranged from 9.1%  in 2017, peaking at 11.1% in 2019.  SUBJECTIVE:   During the last visit (07/05/2019): A1c was 8.3 % we continued lantus, novolog and pioglitazone.    Today (11/04/2019): [name] is here for a  follow up on her diabetes management . She has been checking glucose 2 x a day. Has been getting hypoglycemic episodes daily, mainly occurring in the fasting status, she has not taking it regularly to reduce the amount of hypoglycemia.  Last dose of lantus Thursday 5/20 th    She got married to Lynn (Gene) on 06/14/2019    ROS: As per HPI and as detailed below: Review of Systems  Gastrointestinal: Negative for diarrhea and nausea.  Neurological: Negative for tingling and tremors.       Tingling in the hands      HOME DIABETES REGIMEN:  Lantus 40 units - taking 50 units  Novolog 22 units QAC  Actos 30 mg daily     GLUCOSE LOG:Did not bring        DIABETIC COMPLICATIONS: Microvascular complications:    Denies: neuropathy, nephropathy, retinopathy  Last eye exam: Completed 06/2019  Macrovascular complications:    Denies: CAD, PVD, CVA   HISTORY:  Past Medical History:  Past Medical History:  Diagnosis Date  . Anemia   . Anxiety   . Arthritis    "knees" (09/30/2015)  . Bipolar disorder ([place])   . Depression   . Diabetes mellitus without complication ([place])   . Fibroid    s/p myomectomy 2010, hysterectomy 2013  . Grave's disease   . Heart murmur   . Hormone disorder   . Hypertension   . Hypothyroidism   . 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 Migraine     otc med prn  . PCOS (polycystic ovarian syndrome)   . PMS (premenstrual syndrome)   . PTSD (post-traumatic stress disorder)   . Seasonal allergies   . Thyroid cancer ([place]) 2005  . Thyroid disease   . Type II diabetes mellitus ([place])   . Vertigo    Past Surgical History:  Past Surgical History:  Procedure Laterality Date  . ABDOMINAL HYSTERECTOMY  10/11/2011   Procedure: HYSTERECTOMY ABDOMINAL;  Surgeon: Terrance Mass, MD;  Location: [facility];  Service: Gynecology;  Laterality: N/A;  With Repair of serosa.  . ABDOMINAL HYSTERECTOMY    . CHOLECYSTECTOMY N/A 09/06/2013   Procedure: LAPAROSCOPIC CHOLECYSTECTOMY WITH INTRAOPERATIVE CHOLANGIOGRAM;  Surgeon: Merrie Roof, MD;  Location: [facility];  Service: General;  Laterality: N/A;  . DIAGNOSTIC LAPAROSCOPY    . DILATION AND CURETTAGE OF UTERUS    . HERNIA REPAIR    . MYOMECTOMY  2010   LAPAROSCOPY  . thyroid removed    . TOTAL  THYROIDECTOMY  2006  . UMBILICAL HERNIA REPAIR  1982    Social History:  reports that she has never smoked. She has never used smokeless tobacco. She reports that she does not drink alcohol or use drugs. Family History:  Family History  Problem Relation Age of Onset  . Thyroid disease Mother   . Diabetes Father   . Hypertension Father   . Heart disease Father   . Heart attack Father   . Cancer Maternal Aunt        BRAIN  . Cancer Maternal Grandmother        LYMPHOMA  . Hypertension Maternal Grandmother   . Cancer Paternal Grandmother        PANCREATIC  . Diabetes Paternal Grandmother   . Hypertension Paternal Grandmother   . Hypertension Maternal  Grandfather   . Diabetes Maternal Grandfather   . Hypertension Paternal Grandfather   . Pancreatic cancer Paternal Grandfather      HOME MEDICATIONS: Allergies as of 11/04/2019      Reactions   Bee Venom    Codeine Itching   Tolerates Hydrocodone OK.   Ibuprofen Hives, Other (See Comments)   Happened in childhood   Ibuprofen Hives, Swelling   Metformin And Related Other (See Comments)   Muscle weakness   Morphine Itching, Other (See Comments)   Throat swelling   Penicillins    Childhood Allergy Has patient had a PCN reaction causing immediate rash, facial/tongue/throat swelling, SOB or lightheadedness with hypotension: Unknown Has patient had a PCN reaction causing severe rash involving mucus membranes or skin necrosis: Unknown Has patient had a PCN reaction that required hospitalization: Unknown Has patient had a PCN reaction occurring within the last 10 years: Unknown If all of the above answers are "NO", then may proceed with Cephalosporin use.   Aspirin Hives, Swelling, Rash      Medication List       Accurate as of Nov 04, 2019  2:30 PM. If you have any questions, ask your nurse or doctor.        STOP taking these medications   insulin glargine 100 UNIT/ML injection Commonly known as: LANTUS Replaced by: Lantus SoloStar 100 UNIT/ML Solostar Pen Stopped by: Dorita Sciara, MD     TAKE these medications   atorvastatin 10 MG tablet Commonly known as: LIPITOR Take 1 tablet (10 mg total) by mouth daily. To lower cholesterol   cholecalciferol 1000 units tablet Commonly known as: VITAMIN D Take 2,000 Units by mouth daily.   Detrol LA 4 MG 24 hr capsule Generic drug: tolterodine TAKE 1 CAPSULE (4MG  TOTAL) BY MOUTH DAILY.   diclofenac Sodium 1 % Gel Commonly known as: VOLTAREN Apply topically.   diclofenac sodium 1 % Gel Commonly known as: VOLTAREN Apply up to 4 times daily to painful joint areas   gabapentin 300 MG capsule Commonly known as:  Neurontin Take one cap qd x 1 day, then one cap bix x 1 day and then one cap tid thereafter   hydrOXYzine 25 MG tablet Commonly known as: ATARAX/VISTARIL Take 1 tablet (25 mg total) by mouth 2 (two) times daily.   Insulin Pen Needle 32G X 4 MM Misc Commonly known as: TRUEplus Pen Needles Use as instructed to inject insulin.   Lantus SoloStar 100 UNIT/ML Solostar Pen Generic drug: insulin glargine Inject 38 Units into the skin daily. Replaces: insulin glargine 100 UNIT/ML injection Started by: Dorita Sciara, MD   levothyroxine 125 MCG tablet Commonly known as: SYNTHROID Take  1 tablet (125 mcg total) by mouth daily before breakfast. Must have office visit for refills   lithium carbonate 450 MG CR tablet Commonly known as: ESKALITH Take 450 mg by mouth 2 (two) times daily.   lithium carbonate 300 MG capsule Take 600 mg by mouth at bedtime.   lurasidone 80 MG Tabs tablet Commonly known as: LATUDA Take 80 mg by mouth at bedtime.   methocarbamol 500 MG tablet Commonly known as: Robaxin Take 1 tablet (500 mg total) by mouth 3 (three) times daily as needed for muscle spasms.   NovoLOG FlexPen 100 UNIT/ML FlexPen Generic drug: insulin aspart INJECT 22 UNITS INTO THE SKIN 3 TIMES A DAY WITH MEALS.   omeprazole 40 MG capsule Commonly known as: PRILOSEC Take 1 capsule (40 mg total) by mouth 2 (two) times daily.   pioglitazone 30 MG tablet Commonly known as: Actos Take 1 tablet (30 mg total) by mouth daily.   prazosin 1 MG capsule Commonly known as: MINIPRESS Take 1 capsule (1 mg total) by mouth at bedtime and may repeat dose one time if needed. For sleep/nightmares   propranolol 40 MG tablet Commonly known as: INDERAL Take 1 tablet (40 mg total) by mouth 2 (two) times daily.   traMADol 50 MG tablet Commonly known as: ULTRAM Take 1 tablet (50 mg total) by mouth every 6 (six) hours as needed.        OBJECTIVE:   Vital Signs: BP 124/82 (BP Location: Right  Arm, Patient Position: Sitting, Cuff Size: Large)   Pulse 64   Temp 98.5 F (36.9 C)   Ht 5\' 10"  (1.778 m)   Wt (!) 317 lb 3.2 oz (143.9 kg)   LMP 09/20/2011   SpO2 98%   BMI 45.51 kg/m   Wt Readings from Last 3 Encounters:  11/04/19 (!) 317 lb 3.2 oz (143.9 kg)  10/17/19 300 lb (136.1 kg)  09/19/19 (!) 317 lb 6.4 oz (144 kg)     Exam: General: Pt appears well and is in NAD  Lungs: Clear with good BS bilat with no rales, rhonchi, or wheezes  Heart: RRR with normal S1 and S2 and no gallops; no murmurs; no rub  Extremities: No pretibial edema.   Neuro: MS is good with appropriate affect, pt is alert and Ox3   DM foot exam:07/05/2019 The skin of the feet is intact without sores or ulcerations. The pedal pulses are 2+ on right and 2+ on left. The sensation is intact to a screening 5.07, 10 gram monofilament bilaterally    DATA REVIEWED:     Lab Results  Component Value Date   LDLCALC 105 (H) 02/28/2019   CREATININE 0.86 02/28/2019     ASSESSMENT / PLAN / RECOMMENDATIONS:   1) Type 2 Diabetes Mellitus, Optimally Controlled, Without complications - Most recent A1c of 5.9 %. Goal A1c < 7.0 %.     - Pt with recurrent hypoglycemia, this is due to incorrect dosing of lantus, on her last visit she was advised to reduce Lantus to 40 units but somehow she remained on 50 units.  - Will adjust the dose as below  - Since there's no hypoglycemia during the day, will not change Novolog.    MEDICATIONS:  Decrease Lantus to 38 units   Continue Novolog  22 units TIDQAC  Continue Pioglitazone 30 mg daily   EDUCATION / INSTRUCTIONS:  BG monitoring instructions: Patient is instructed to check her blood sugars 4 times a day,before meals and bedtime.  Call [facility]  Endocr[facility]ology clinic if: BG persistently < 70 or > 300. . I reviewed the Rule of 15 for the treatment of hypoglycemia in detail with the patient. Literature supplied.   F/U in 3 months     Signed  electronically by: Mack Guise, MD  [facility]  [facility] [address] [place], [place] [address] Phone: [phone] FAX: [phone]   CC: Antony Blackbird, MD [place] [address] Phone: [phone]  Fax: [phone]  Return to Endocrinology clinic as below: Future Appointments  Date Time Provider [place]  02/14/2020  1:20 PM Shamleffer, Melanie Crazier, MD [facility] None

## 2019-11-07 ENCOUNTER — Other Ambulatory Visit: Payer: Self-pay | Admitting: Internal Medicine

## 2019-11-20 ENCOUNTER — Other Ambulatory Visit: Payer: Self-pay | Admitting: Family Medicine

## 2019-11-20 DIAGNOSIS — R32 Unspecified urinary incontinence: Secondary | ICD-10-CM

## 2019-12-12 ENCOUNTER — Other Ambulatory Visit: Payer: Self-pay | Admitting: Family Medicine

## 2019-12-12 DIAGNOSIS — R0789 Other chest pain: Secondary | ICD-10-CM

## 2019-12-17 ENCOUNTER — Other Ambulatory Visit: Payer: Self-pay | Admitting: Pharmacist

## 2019-12-17 DIAGNOSIS — E890 Postprocedural hypothyroidism: Secondary | ICD-10-CM

## 2019-12-17 MED ORDER — LEVOTHYROXINE SODIUM 125 MCG PO TABS: 125.0000 ug | ORAL_TABLET | Freq: Every day | ORAL | 0 refills | Status: DC

## 2020-01-23 ENCOUNTER — Telehealth: Payer: Self-pay

## 2020-01-23 ENCOUNTER — Other Ambulatory Visit: Payer: Self-pay | Admitting: Family Medicine

## 2020-01-23 ENCOUNTER — Telehealth: Payer: Self-pay | Admitting: Family Medicine

## 2020-01-23 ENCOUNTER — Other Ambulatory Visit: Payer: Self-pay | Admitting: Internal Medicine

## 2020-01-23 DIAGNOSIS — E890 Postprocedural hypothyroidism: Secondary | ICD-10-CM

## 2020-01-23 NOTE — Telephone Encounter (Signed)
Please advise.    Copied from [place] [phone]. Topic: General - Other >> Jan 23, 2020  4:47 PM Yvette Rack wrote: Reason for CRM: Pt requested that [facility] contact her regarding the paperwork needed for the blue card and orange card.

## 2020-01-23 NOTE — Telephone Encounter (Signed)
Pt request LEVOTHYROXIN short supply to last until appt 02/18/20. Pt request please.

## 2020-01-24 MED ORDER — LEVOTHYROXINE SODIUM 125 MCG PO TABS: 125.0000 ug | ORAL_TABLET | Freq: Every day | ORAL | 0 refills | Status: DC

## 2020-01-24 NOTE — Telephone Encounter (Signed)
Rx sent 

## 2020-01-27 ENCOUNTER — Other Ambulatory Visit: Payer: Self-pay | Admitting: Internal Medicine

## 2020-01-27 NOTE — Telephone Encounter (Signed)
I return Pt call, LVM inform her of what she need to apply for CAFA and she need also a financial appt in person

## 2020-01-29 ENCOUNTER — Ambulatory Visit: Payer: No Typology Code available for payment source | Admitting: Physician Assistant

## 2020-01-30 ENCOUNTER — Ambulatory Visit: Payer: No Typology Code available for payment source | Admitting: Internal Medicine

## 2020-02-10 ENCOUNTER — Telehealth (INDEPENDENT_AMBULATORY_CARE_PROVIDER_SITE_OTHER): Payer: No Payment, Other | Admitting: Psychiatry

## 2020-02-10 ENCOUNTER — Other Ambulatory Visit: Payer: Self-pay

## 2020-02-10 ENCOUNTER — Other Ambulatory Visit (HOSPITAL_COMMUNITY): Payer: Self-pay | Admitting: Psychiatry

## 2020-02-10 ENCOUNTER — Encounter (HOSPITAL_COMMUNITY): Payer: Self-pay | Admitting: Psychiatry

## 2020-02-10 DIAGNOSIS — F3132 Bipolar disorder, current episode depressed, moderate: Secondary | ICD-10-CM | POA: Diagnosis not present

## 2020-02-10 MED ORDER — PRAZOSIN HCL 1 MG PO CAPS: 1.0000 mg | ORAL_CAPSULE | Freq: Every day | ORAL | 2 refills | Status: DC

## 2020-02-10 MED ORDER — CITALOPRAM HYDROBROMIDE 10 MG PO TABS: 10.0000 mg | ORAL_TABLET | Freq: Every day | ORAL | 2 refills | Status: DC

## 2020-02-10 MED ORDER — LITHIUM CARBONATE ER 450 MG PO TBCR: EXTENDED_RELEASE_TABLET | ORAL | 2 refills | Status: DC

## 2020-02-10 MED ORDER — HYDROXYZINE HCL 25 MG PO TABS: 25.0000 mg | ORAL_TABLET | Freq: Two times a day (BID) | ORAL | 2 refills | Status: DC

## 2020-02-10 MED ORDER — LURASIDONE HCL 80 MG PO TABS: 80.0000 mg | ORAL_TABLET | Freq: Every day | ORAL | 2 refills | Status: DC

## 2020-02-10 NOTE — Progress Notes (Signed)
Psychiatric Initial Adult Assessment   Virtual Visit via Video Note  I connected with Lynn Martinez on 02/10/20 at  3:00 PM EDT by a video enabled telemedicine application and verified that I am speaking with the correct person using two identifiers.  Location: Patient: Home Provider: Clinic   I discussed the limitations of evaluation and management by telemedicine and the availability of in person appointments. The patient expressed understanding and agreed to proceed.  I provided 35 minutes of non-face-to-face time during this encounter.   Patient Identification: Lynn Martinez MRN:  [id number] Date of Evaluation:  02/10/2020   Referral Source: [facility]  Chief Complaint:   " I feel fine."  Visit Diagnosis:    ICD-10-CM   1. Bipolar 1 disorder, depressed, moderate (HCC)  F31.32 lithium carbonate (ESKALITH) 450 MG CR tablet    lurasidone (LATUDA) 80 MG TABS tablet    prazosin (MINIPRESS) 1 MG capsule    citalopram (CELEXA) 10 MG tablet    hydrOXYzine (ATARAX/VISTARIL) 25 MG tablet    History of Present Illness:  This is a 49 y/o female with hx of bipolar d/o and anxiety now seen for evaluation and establishing care. Pt reported that she was seeing providers at [facility] [place]. She reported that she has been on combination of Lithium and Latuda for a while and feels that it has really helped her with her mood immensely. She reported that her mood does not fluctuate as much as it used to. However, there are days when she feels that she is working a mask and is hiding her depressed mood from others. She feels sad and tearful at times. Her energy levels are down and her sleep varies. She is currently taking Lithium ER 450 mg 2 caps at bedtime, Latuda 80 mg HS, Hydroxyzine 25 mg BID, Prazosin 1 mg HS. She informed that Prazosin helps her with her racing thoughts and helps her sleep better. She takes Hydroxyzine regularly 2 times  daily.  Labs were reviewed and she has not had Lithium levels checked in a while.  Pt reported that she took Prozac in the past but never took an antidepressant in combination with Lithium. She was willing to try an alternative antidepressant. She was offered Celexa. Potential side effects of medication and risks vs benefits of treatment vs non-treatment were explained and discussed. All questions were answered.  She denied any suicidal or homicidal ideations. She denied any ongoing manic or hypomanic symptoms. She denied any hallucinations or delusions.   Past Psychiatric History: Bipolar d/o, anxiety  Previous Psychotropic Medications: Yes   Substance Abuse History in the last 12 months:  No.  Consequences of Substance Abuse: NA  Past Medical History:  Past Medical History:  Diagnosis Date  . Anemia   . Anxiety   . Arthritis    "knees" (09/30/2015)  . Bipolar disorder ([place])   . Depression   . Diabetes mellitus without complication ([place])   . Fibroid    s/p myomectomy 2010, hysterectomy 2013  . Grave's disease   . Heart murmur   . Hormone disorder   . Hypertension   . Hypothyroidism   . 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 Migraine     otc med prn  . PCOS (polycystic ovarian syndrome)   . PMS (premenstrual syndrome)   . PTSD (post-traumatic stress disorder)   . Seasonal allergies   . Thyroid cancer ([place]) 2005  .  Thyroid disease   . Type II diabetes mellitus ([place])   . Vertigo     Past Surgical History:  Procedure Laterality Date  . ABDOMINAL HYSTERECTOMY  10/11/2011   Procedure: HYSTERECTOMY ABDOMINAL;  Surgeon: Terrance Mass, MD;  Location: [facility];  Service: Gynecology;  Laterality: N/A;  With Repair of serosa.  . ABDOMINAL HYSTERECTOMY    . CHOLECYSTECTOMY N/A 09/06/2013   Procedure: LAPAROSCOPIC CHOLECYSTECTOMY WITH INTRAOPERATIVE CHOLANGIOGRAM;  Surgeon: Merrie Roof, MD;  Location: [facility];  Service: General;  Laterality: N/A;   . DIAGNOSTIC LAPAROSCOPY    . DILATION AND CURETTAGE OF UTERUS    . HERNIA REPAIR    . MYOMECTOMY  2010   LAPAROSCOPY  . thyroid removed    . TOTAL THYROIDECTOMY  2006  . UMBILICAL HERNIA REPAIR  1982    Family Psychiatric History: denied  Family History:  Family History  Problem Relation Age of Onset  . Thyroid disease Mother   . Diabetes Father   . Hypertension Father   . Heart disease Father   . Heart attack Father   . Cancer Maternal Aunt        BRAIN  . Cancer Maternal Grandmother        LYMPHOMA  . Hypertension Maternal Grandmother   . Cancer Paternal Grandmother        PANCREATIC  . Diabetes Paternal Grandmother   . Hypertension Paternal Grandmother   . Hypertension Maternal Grandfather   . Diabetes Maternal Grandfather   . Hypertension Paternal Grandfather   . Pancreatic cancer Paternal Grandfather     Social History:   Social History   Socioeconomic History  . Marital status: Single    Spouse name: Not on file  . Number of children: Not on file  . Years of education: Not on file  . Highest education level: 12th grade  Occupational History  . Not on file  Tobacco Use  . Smoking status: Never Smoker  . Smokeless tobacco: Never Used  Vaping Use  . Vaping Use: Never used  Substance and Sexual Activity  . Alcohol use: Never  . Drug use: Never  . Sexual activity: Not Currently    Birth control/protection: Surgical  Other Topics Concern  . Not on file  Social History Narrative   ** Merged History Encounter **       Social Determinants of Health   Financial Resource Strain:   . Difficulty of Paying Living Expenses: Not on file  Food Insecurity:   . Worried About Charity fundraiser in the Last Year: Not on file  . Ran Out of Food in the Last Year: Not on file  Transportation Needs: No Transportation Needs  . Lack of Transportation (Medical): No  . Lack of Transportation (Non-Medical): No  Physical Activity:   . Days of Exercise per Week: Not  on file  . Minutes of Exercise per Session: Not on file  Stress:   . Feeling of Stress : Not on file  Social Connections:   . Frequency of Communication with Friends and Family: Not on file  . Frequency of Social Gatherings with Friends and Family: Not on file  . Attends Religious Services: Not on file  . Active Member of Clubs or Organizations: Not on file  . Attends Archivist Meetings: Not on file  . Marital Status: Not on file    Additional Social History: Lives with husband. Works at [employer] center. Husband is a retired English as a second language teacher. Has  no children.  Allergies:   Allergies  Allergen Reactions  . Bee Venom   . Codeine Itching    Tolerates Hydrocodone OK.  . Ibuprofen Hives and Other (See Comments)    Happened in childhood  . Ibuprofen Hives and Swelling  . Metformin And Related Other (See Comments)    Muscle weakness  . Morphine Itching and Other (See Comments)    Throat swelling  . Penicillins     Childhood Allergy Has patient had a PCN reaction causing immediate rash, facial/tongue/throat swelling, SOB or lightheadedness with hypotension: Unknown Has patient had a PCN reaction causing severe rash involving mucus membranes or skin necrosis: Unknown Has patient had a PCN reaction that required hospitalization: Unknown Has patient had a PCN reaction occurring within the last 10 years: Unknown If all of the above answers are "NO", then may proceed with Cephalosporin use.   . Aspirin Hives, Swelling and Rash    Metabolic Disorder Labs: Lab Results  Component Value Date   HGBA1C 5.9 (A) 09/19/2019   MPG 214 09/30/2015   MPG 123 (H) 03/08/2012   No results found for: PROLACTIN Lab Results  Component Value Date   CHOL 171 02/28/2019   TRIG 115 02/28/2019   HDL 45 02/28/2019   CHOLHDL 3.8 02/28/2019   VLDL 22 09/30/2015   LDLCALC 105 (H) 02/28/2019   LDLCALC 66 04/11/2018   Lab Results  Component Value Date   TSH 4.830 (H) 02/28/2019     Therapeutic Level Labs: No results found for: LITHIUM No results found for: CBMZ No results found for: VALPROATE  Current Medications: Current Outpatient Medications  Medication Sig Dispense Refill  . tolterodine (DETROL LA) 4 MG 24 hr capsule Take 1 capsule (4 mg total) by mouth daily. Please schedule an appointment with Dr. Chapman Fitch. 30 capsule 0  . atorvastatin (LIPITOR) 10 MG tablet Take 1 tablet (10 mg total) by mouth daily. To lower cholesterol 90 tablet 3  . cholecalciferol (VITAMIN D) 1000 UNITS tablet Take 2,000 Units by mouth daily.     . citalopram (CELEXA) 10 MG tablet Take 1 tablet (10 mg total) by mouth daily. 30 tablet 2  . diclofenac sodium (VOLTAREN) 1 % GEL Apply up to 4 times daily to painful joint areas 1 Tube 6  . diclofenac Sodium (VOLTAREN) 1 % GEL Apply topically.    . gabapentin (NEURONTIN) 300 MG capsule Take one cap qd x 1 day, then one cap bix x 1 day and then one cap tid thereafter 90 capsule 1  . hydrOXYzine (ATARAX/VISTARIL) 25 MG tablet Take 1 tablet (25 mg total) by mouth 2 (two) times daily. 60 tablet 2  . insulin glargine (LANTUS SOLOSTAR) 100 UNIT/ML Solostar Pen Inject 38 Units into the skin daily. 15 mL 11  . Insulin Pen Needle (TRUEPLUS PEN NEEDLES) 32G X 4 MM MISC Use as instructed to inject insulin. 100 each 11  . levothyroxine (SYNTHROID) 125 MCG tablet Take 1 tablet (125 mcg total) by mouth daily before breakfast. Must have office visit for refills 30 tablet 0  . lithium carbonate (ESKALITH) 450 MG CR tablet Take 2 capsules at bedtime 60 tablet 2  . lurasidone (LATUDA) 80 MG TABS tablet Take 1 tablet (80 mg total) by mouth at bedtime. 30 tablet 2  . methocarbamol (ROBAXIN) 500 MG tablet Take 1 tablet (500 mg total) by mouth 3 (three) times daily as needed for muscle spasms. 30 tablet 0  . NOVOLOG FLEXPEN 100 UNIT/ML FlexPen INJECT 22 UNITS INTO  THE SKIN 3 TIMES A DAY WITH MEALS. 18 mL 3  . omeprazole (PRILOSEC) 40 MG capsule TAKE 1 CAPSULE (40 MG  TOTAL) BY MOUTH 2 (TWO) TIMES DAILY. 60 capsule 3  . pioglitazone (ACTOS) 30 MG tablet TAKE 1 TABLET (30 MG TOTAL) BY MOUTH DAILY. 30 tablet 2  . prazosin (MINIPRESS) 1 MG capsule Take 1 capsule (1 mg total) by mouth at bedtime. For sleep/nightmares 30 capsule 2  . propranolol (INDERAL) 40 MG tablet Take 1 tablet (40 mg total) by mouth 2 (two) times daily. 60 tablet 2  . traMADol (ULTRAM) 50 MG tablet Take 1 tablet (50 mg total) by mouth every 6 (six) hours as needed. 30 tablet 0   No current facility-administered medications for this visit.     Psychiatric Specialty Exam: Review of Systems  Last menstrual period 09/20/2011.There is no height or weight on file to calculate BMI.  General Appearance: Fairly Groomed  Eye Contact:  Good  Speech:  Clear and Coherent and Normal Rate  Volume:  Normal  Mood:  Depressed  Affect:  Congruent  Thought Process:  Goal Directed and Descriptions of Associations: Intact  Orientation:  Full (Time, Place, and Person)  Thought Content:  Logical  Suicidal Thoughts:  No  Homicidal Thoughts:  No  Memory:  Immediate;   Good Recent;   Good  Judgement:  Fair  Insight:  Fair  Psychomotor Activity:  Normal  Concentration:  Concentration: Good and Attention Span: Good  Recall:  Good  Fund of Knowledge:Good  Language: Good  Akathisia:  Negative  Handed:  Right  AIMS (if indicated):  Not done  Assets:  Communication Skills Desire for Improvement Financial Resources/Insurance Housing  ADL's:  Intact  Cognition: WNL  Sleep:  Fair   Screenings: GAD-7     Office Visit from 02/28/2019 in [place] Office Visit from 10/03/2018 in [place] Office Visit from 07/04/2018 in [place] Office Visit from 05/23/2018 in [place] Office Visit from 03/15/2018 in [place]  Total GAD-7 Score 12 15 14 21 14      PHQ2-9     Office Visit from 07/03/2019 in [place] Office Visit from 02/28/2019 in [place] Office Visit from 10/03/2018 in [place] Office Visit from 07/04/2018 in [place] Office Visit from 05/23/2018 in [place]  PHQ-2 Total Score 2 4 1 4 6   PHQ-9 Total Score 9 14 7 14 23       Assessment and Plan: Pt reported ongoing depressive symptoms, was agreeable to adding Celexa to her current regimen.  1. Bipolar 1 disorder, depressed, moderate (HCC)  - lithium carbonate (ESKALITH) 450 MG CR tablet; Take 2 capsules at bedtime  Dispense: 60 tablet; Refill: 2 - lurasidone (LATUDA) 80 MG TABS tablet; Take 1 tablet (80 mg total) by mouth at bedtime.  Dispense: 30 tablet; Refill: 2 - prazosin (MINIPRESS) 1 MG capsule; Take 1 capsule (1 mg total) by mouth at bedtime. For sleep/nightmares  Dispense: 30 capsule; Refill: 2 - Start citalopram (CELEXA) 10 MG tablet; Take 1 tablet (10 mg total) by mouth daily.  Dispense: 30 tablet; Refill: 2 - hydrOXYzine (ATARAX/VISTARIL) 25 MG tablet; Take 1 tablet (25 mg total) by mouth 2 (two) times daily.  Dispense: 60 tablet; Refill: 2 - Check  Lithium level at the time of next lab draw.  F//up in 2 months.    N[place] Crane, MD 8/30/20212:09 PM

## 2020-02-14 ENCOUNTER — Ambulatory Visit: Payer: No Typology Code available for payment source | Admitting: Internal Medicine

## 2020-02-18 ENCOUNTER — Ambulatory Visit: Payer: No Typology Code available for payment source | Admitting: Family

## 2020-02-18 ENCOUNTER — Encounter: Payer: Self-pay | Admitting: Family Medicine

## 2020-02-19 ENCOUNTER — Other Ambulatory Visit: Payer: Self-pay

## 2020-02-19 ENCOUNTER — Ambulatory Visit (HOSPITAL_COMMUNITY)
Admission: EM | Admit: 2020-02-19 | Discharge: 2020-02-19 | Disposition: A | Discharge status: Home / Self Care | Payer: HRSA Program | Attending: Urgent Care | Admitting: Urgent Care

## 2020-02-19 ENCOUNTER — Encounter (HOSPITAL_COMMUNITY): Payer: Self-pay

## 2020-02-19 DIAGNOSIS — Z7901 Long term (current) use of anticoagulants: Secondary | ICD-10-CM | POA: Insufficient documentation

## 2020-02-19 DIAGNOSIS — F429 Obsessive-compulsive disorder, unspecified: Secondary | ICD-10-CM | POA: Diagnosis not present

## 2020-02-19 DIAGNOSIS — R110 Nausea: Secondary | ICD-10-CM | POA: Diagnosis not present

## 2020-02-19 DIAGNOSIS — E559 Vitamin D deficiency, unspecified: Secondary | ICD-10-CM | POA: Diagnosis not present

## 2020-02-19 DIAGNOSIS — Z79899 Other long term (current) drug therapy: Secondary | ICD-10-CM | POA: Insufficient documentation

## 2020-02-19 DIAGNOSIS — F319 Bipolar disorder, unspecified: Secondary | ICD-10-CM | POA: Diagnosis not present

## 2020-02-19 DIAGNOSIS — E119 Type 2 diabetes mellitus without complications: Secondary | ICD-10-CM | POA: Insufficient documentation

## 2020-02-19 DIAGNOSIS — I10 Essential (primary) hypertension: Secondary | ICD-10-CM | POA: Insufficient documentation

## 2020-02-19 DIAGNOSIS — F419 Anxiety disorder, unspecified: Secondary | ICD-10-CM | POA: Diagnosis not present

## 2020-02-19 DIAGNOSIS — M1990 Unspecified osteoarthritis, unspecified site: Secondary | ICD-10-CM | POA: Insufficient documentation

## 2020-02-19 DIAGNOSIS — Z20822 Contact with and (suspected) exposure to covid-19: Secondary | ICD-10-CM | POA: Insufficient documentation

## 2020-02-19 DIAGNOSIS — R5383 Other fatigue: Secondary | ICD-10-CM | POA: Diagnosis not present

## 2020-02-19 DIAGNOSIS — Z794 Long term (current) use of insulin: Secondary | ICD-10-CM | POA: Insufficient documentation

## 2020-02-19 DIAGNOSIS — F4310 Post-traumatic stress disorder, unspecified: Secondary | ICD-10-CM | POA: Insufficient documentation

## 2020-02-19 DIAGNOSIS — R42 Dizziness and giddiness: Secondary | ICD-10-CM | POA: Insufficient documentation

## 2020-02-19 DIAGNOSIS — R197 Diarrhea, unspecified: Secondary | ICD-10-CM | POA: Diagnosis not present

## 2020-02-19 DIAGNOSIS — E039 Hypothyroidism, unspecified: Secondary | ICD-10-CM | POA: Insufficient documentation

## 2020-02-19 LAB — SARS CORONAVIRUS 2 (TAT 6-24 HRS): SARS Coronavirus 2: NEGATIVE

## 2020-02-19 MED ORDER — ONDANSETRON 4 MG PO TBDP: 4.0000 mg | ORAL_TABLET | Freq: Three times a day (TID) | ORAL | 0 refills | Status: DC | PRN

## 2020-02-19 MED ORDER — LOPERAMIDE HCL 2 MG PO CAPS: 2.0000 mg | ORAL_CAPSULE | Freq: Four times a day (QID) | ORAL | 0 refills | Status: DC | PRN

## 2020-02-19 NOTE — ED Triage Notes (Addendum)
Pt presents with fatigue, nausea and diarrhea x 4 days. Denies fever, sob, cough.   Pt reports for the past week she feels dizzy when sitting, standing and laying down.

## 2020-02-19 NOTE — ED Provider Notes (Signed)
[place]    CSN: [id number] Arrival date & time: 02/19/20  0938      History   Chief Complaint Chief Complaint  Patient presents with  . Fatigue  . Nausea    HPI Lynn Martinez is a 49 y.o. female.   Fatigue, persistent diarrhea for about a week, maybe a bit longer. Having some dizziness/lightheadedness, nausea, no vomiting or abdominal pain. Tolerating PO well. No known sick contacts. Working extra long hours at work lately and feels this has run her down. Has not been taking anything OTC for sxs. Of note, was to have PCP chronic condition f/u yesterday to recheck BSs and TSH but this was cancelled and rescheduled to near future - both levels have been a bit off lately.      Past Medical History:  Diagnosis Date  . Anemia   . Anxiety   . Arthritis    "knees" (09/30/2015)  . Bipolar disorder ([place])   . Depression   . Diabetes mellitus without complication ([place])   . Fibroid    s/p myomectomy 2010, hysterectomy 2013  . Grave's disease   . Heart murmur   . Hormone disorder   . Hypertension   . Hypothyroidism   . 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 Migraine     otc med prn  . PCOS (polycystic ovarian syndrome)   . PMS (premenstrual syndrome)   . PTSD (post-traumatic stress disorder)   . Seasonal allergies   . Thyroid cancer ([place]) 2005  . Thyroid disease   . Type II diabetes mellitus ([place])   . Vertigo     Patient Active Problem List   Diagnosis Date Noted  . Bipolar 1 disorder, depressed, moderate ([place]) 02/10/2020  . Screening breast examination 06/11/2019  . Breast pain, left 06/11/2019  . Radiculopathy, cervical region 11/21/2018  . Type 2 diabetes mellitus without complication, with long-term current use of insulin ([place]) 10/31/2018  . Carpal tunnel syndrome, right upper limb 10/08/2018  . Carpal tunnel syndrome, left upper limb 10/08/2018  . Pain in the chest 09/30/2015  . Hypertension 09/30/2015  . Type  2 diabetes mellitus with hyperglycemia, with long-term current use of insulin ([place]) 09/30/2015  . Chest pain 09/30/2015  . Cholelithiasis with acute cholecystitis 09/05/2013  . Borderline behavior 03/14/2012    Class: Chronic  . OCD (obsessive compulsive disorder) 03/14/2012    Class: Chronic  . PTSD (post-traumatic stress disorder) 03/14/2012    Class: Chronic  . 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 Lumbago without sciatica 03/12/2012  . Major depression, recurrent ([place]) 03/08/2012    Class: Chronic  . Snoring 12/02/2011  . PCOS (polycystic ovarian syndrome) 09/05/2011  . Hypothyroidism 08/13/2010  . VITAMIN D DEFICIENCY 08/13/2010  . ANEMIA-NOS 08/13/2010    Past Surgical History:  Procedure Laterality Date  . ABDOMINAL HYSTERECTOMY  10/11/2011   Procedure: HYSTERECTOMY ABDOMINAL;  Surgeon: Terrance Mass, MD;  Location: [facility];  Service: Gynecology;  Laterality: N/A;  With Repair of serosa.  . ABDOMINAL HYSTERECTOMY    . CHOLECYSTECTOMY N/A 09/06/2013   Procedure: LAPAROSCOPIC CHOLECYSTECTOMY WITH INTRAOPERATIVE CHOLANGIOGRAM;  Surgeon: Merrie Roof, MD;  Location: [facility];  Service: General;  Laterality: N/A;  . DIAGNOSTIC LAPAROSCOPY    . DILATION AND CURETTAGE OF UTERUS    . HERNIA REPAIR    . MYOMECTOMY  2010   LAPAROSCOPY  . thyroid removed    .  TOTAL THYROIDECTOMY  2006  . UMBILICAL HERNIA REPAIR  1982    OB History    Gravida  2   Para  0   Term  0   Preterm  0   AB  2   Living        SAB  2   TAB  0   Ectopic  0   Multiple      Live Births  0            Home Medications    Prior to Admission medications   Medication Sig Start Date End Date Taking? Authorizing Provider  tolterodine (DETROL LA) 4 MG 24 hr capsule Take 1 capsule (4 mg total) by mouth daily. Please schedule an appointment with Dr. Chapman Fitch. 11/22/19   Fulp, Cammie, MD  atorvastatin (LIPITOR) 10 MG tablet Take 1 tablet (10 mg total) by  mouth daily. To lower cholesterol 03/08/19   Fulp, Cammie, MD  cholecalciferol (VITAMIN D) 1000 UNITS tablet Take 2,000 Units by mouth daily.  03/14/12   Darrol Jump, MD  citalopram (CELEXA) 10 MG tablet Take 1 tablet (10 mg total) by mouth daily. 02/10/20   Nevada Crane, MD  diclofenac sodium (VOLTAREN) 1 % GEL Apply up to 4 times daily to painful joint areas 10/03/18   Fulp, Cammie, MD  diclofenac Sodium (VOLTAREN) 1 % GEL Apply topically.    [provider]  gabapentin (NEURONTIN) 300 MG capsule Take one cap qd x 1 day, then one cap bix x 1 day and then one cap tid thereafter 11/21/18   Aundra Dubin, PA-C  hydrOXYzine (ATARAX/VISTARIL) 25 MG tablet Take 1 tablet (25 mg total) by mouth 2 (two) times daily. 02/10/20   Nevada Crane, MD  insulin glargine (LANTUS SOLOSTAR) 100 UNIT/ML Solostar Pen Inject 38 Units into the skin daily. 11/04/19   Shamleffer, Melanie Crazier, MD  Insulin Pen Needle (TRUEPLUS PEN NEEDLES) 32G X 4 MM MISC Use as instructed to inject insulin. 04/25/18   Fulp, Ander Gaster, MD  levothyroxine (SYNTHROID) 125 MCG tablet Take 1 tablet (125 mcg total) by mouth daily before breakfast. Must have office visit for refills 01/24/20   Fulp, Cammie, MD  lithium carbonate (ESKALITH) 450 MG CR tablet Take 2 capsules at bedtime 02/10/20   Nevada Crane, MD  loperamide (IMODIUM) 2 MG capsule Take 1 capsule (2 mg total) by mouth 4 (four) times daily as needed for diarrhea or loose stools. 02/19/20   Volney American, PA-C  lurasidone (LATUDA) 80 MG TABS tablet Take 1 tablet (80 mg total) by mouth at bedtime. 02/10/20   Nevada Crane, MD  methocarbamol (ROBAXIN) 500 MG tablet Take 1 tablet (500 mg total) by mouth 3 (three) times daily as needed for muscle spasms. 08/12/19   Burky, Malachy Moan, NP  NOVOLOG FLEXPEN 100 UNIT/ML FlexPen INJECT 22 UNITS INTO THE SKIN 3 TIMES A DAY WITH MEALS. 11/08/19   Shamleffer, Melanie Crazier, MD  omeprazole (PRILOSEC) 40 MG capsule TAKE 1 CAPSULE (40 MG  TOTAL) BY MOUTH 2 (TWO) TIMES DAILY. 12/12/19   Fulp, Cammie, MD  ondansetron (ZOFRAN ODT) 4 MG disintegrating tablet Take 1 tablet (4 mg total) by mouth every 8 (eight) hours as needed for nausea or vomiting. 02/19/20   Volney American, PA-C  pioglitazone (ACTOS) 30 MG tablet TAKE 1 TABLET (30 MG TOTAL) BY MOUTH DAILY. 01/27/20   Shamleffer, Melanie Crazier, MD  prazosin (MINIPRESS) 1 MG capsule Take 1 capsule (1 mg total) by mouth  at bedtime. For sleep/nightmares 02/10/20   Nevada Crane, MD  propranolol (INDERAL) 40 MG tablet Take 1 tablet (40 mg total) by mouth 2 (two) times daily. 10/03/18   Fulp, Cammie, MD  traMADol (ULTRAM) 50 MG tablet Take 1 tablet (50 mg total) by mouth every 6 (six) hours as needed. 10/08/18   Aundra Dubin, PA-C    Family History Family History  Problem Relation Age of Onset  . Thyroid disease Mother   . Diabetes Father   . Hypertension Father   . Heart disease Father   . Heart attack Father   . Cancer Maternal Aunt        BRAIN  . Cancer Maternal Grandmother        LYMPHOMA  . Hypertension Maternal Grandmother   . Cancer Paternal Grandmother        PANCREATIC  . Diabetes Paternal Grandmother   . Hypertension Paternal Grandmother   . Hypertension Maternal Grandfather   . Diabetes Maternal Grandfather   . Hypertension Paternal Grandfather   . Pancreatic cancer Paternal Grandfather     Social History Social History   Tobacco Use  . Smoking status: Never Smoker  . Smokeless tobacco: Never Used  Vaping Use  . Vaping Use: Never used  Substance Use Topics  . Alcohol use: Never  . Drug use: Never     Allergies   Bee venom, Codeine, Ibuprofen, Ibuprofen, Metformin and related, Morphine, Penicillins, and Aspirin   Review of Systems Review of Systems PER HPI  Physical Exam Triage Vital Signs ED Triage Vitals  Enc Vitals Group     BP 02/19/20 0851 137/73     Pulse Rate 02/19/20 0851 (!) 54     Resp 02/19/20 0851 20     Temp 02/19/20  0851 98.7 F (37.1 C)     Temp Source 02/19/20 0851 Oral     SpO2 02/19/20 0851 100 %     Weight --      Height --      Head Circumference --      Peak Flow --      Pain Score 02/19/20 0850 0     Pain Loc --      Pain Edu? --      Excl. in [place]? --    No data found.  Updated Vital Signs BP 137/73 (BP Location: Right Arm)   Pulse (!) 54   Temp 98.7 F (37.1 C) (Oral)   Resp 20   LMP 09/20/2011   SpO2 100%   Visual Acuity Right Eye Distance:   Left Eye Distance:   Bilateral Distance:    Right Eye Near:   Left Eye Near:    Bilateral Near:     Physical Exam Vitals and nursing note reviewed.  Constitutional:      Appearance: Normal appearance. She is not ill-appearing.  HENT:     Head: Atraumatic.     Right Ear: Tympanic membrane and ear canal normal.     Left Ear: Tympanic membrane and ear canal normal.     Nose: Rhinorrhea present.     Mouth/Throat:     Mouth: Mucous membranes are moist.     Pharynx: Oropharynx is clear. Posterior oropharyngeal erythema present.  Eyes:     Extraocular Movements: Extraocular movements intact.     Conjunctiva/sclera: Conjunctivae normal.  Cardiovascular:     Rate and Rhythm: Normal rate and regular rhythm.     Heart sounds: Normal heart sounds.  Pulmonary:  Effort: Pulmonary effort is normal.     Breath sounds: Normal breath sounds.  Abdominal:     General: Bowel sounds are normal. There is no distension.     Palpations: Abdomen is soft.     Tenderness: There is no abdominal tenderness.  Musculoskeletal:        General: Normal range of motion.     Cervical back: Normal range of motion and neck supple.  Skin:    General: Skin is warm and dry.  Neurological:     Mental Status: She is alert and oriented to person, place, and time.  Psychiatric:        Mood and Affect: Mood normal.        Thought Content: Thought content normal.        Judgment: Judgment normal.      UC Treatments / Results  Labs (all labs ordered  are listed, but only abnormal results are displayed) Labs Reviewed  SARS CORONAVIRUS 2 (TAT 6-24 HRS)    EKG   Radiology No results found.  Procedures Procedures (including critical care time)  Medications Ordered in UC Medications - No data to display  Initial Impression / Assessment and Plan / UC Course  I have reviewed the triage vital signs and the nursing notes.  Pertinent labs & imaging results that were available during my care of the patient were reviewed by me and considered in my medical decision making (see chart for details).     Will r/o COVID, tx sxs with imodium and zofran prn, rest, good hydration. F/u ASAP with PCP to check chronic condition labs to ensure these are not playing a role in sxs. Work note given to isolate while test results pending. Return precautions to UC/ED reviewed if worsening. Final Clinical Impressions(s) / UC Diagnoses   Final diagnoses:  Fatigue, unspecified type  Diarrhea, unspecified type  Nausea     Discharge Instructions     Eat a bland diet for the next few days, stay well hydrated. Follow up as soon as you're able with your primary care provider to check in on chronic conditions as well as follow up on these new symptoms. Return for evaluation if worsening and not improving in the meantime.     ED Prescriptions    Medication Sig Dispense Auth. Provider   loperamide (IMODIUM) 2 MG capsule Take 1 capsule (2 mg total) by mouth 4 (four) times daily as needed for diarrhea or loose stools. 24 capsule Volney American, PA-C   ondansetron (ZOFRAN ODT) 4 MG disintegrating tablet Take 1 tablet (4 mg total) by mouth every 8 (eight) hours as needed for nausea or vomiting. 21 tablet Volney American, [place]     PDMP not reviewed this encounter.   Volney American, [place] 02/19/20 1206

## 2020-02-19 NOTE — Discharge Instructions (Signed)
Eat a bland diet for the next few days, stay well hydrated. Follow up as soon as you're able with your primary care provider to check in on chronic conditions as well as follow up on these new symptoms. Return for evaluation if worsening and not improving in the meantime.

## 2020-02-21 ENCOUNTER — Other Ambulatory Visit: Payer: Self-pay | Admitting: Family Medicine

## 2020-02-21 ENCOUNTER — Other Ambulatory Visit: Payer: Self-pay | Admitting: Pharmacist

## 2020-02-21 DIAGNOSIS — E890 Postprocedural hypothyroidism: Secondary | ICD-10-CM

## 2020-02-21 MED ORDER — LEVOTHYROXINE SODIUM 125 MCG PO TABS: 125.0000 ug | ORAL_TABLET | Freq: Every day | ORAL | 0 refills | Status: DC

## 2020-03-18 ENCOUNTER — Ambulatory Visit: Payer: Self-pay | Attending: Physician Assistant | Admitting: Physician Assistant

## 2020-03-18 ENCOUNTER — Other Ambulatory Visit: Payer: Self-pay

## 2020-03-18 ENCOUNTER — Other Ambulatory Visit: Payer: Self-pay | Admitting: Physician Assistant

## 2020-03-18 ENCOUNTER — Encounter: Payer: Self-pay | Admitting: Physician Assistant

## 2020-03-18 VITALS — BP 118/77 | HR 83 | Resp 16 | Wt 304.8 lb

## 2020-03-18 DIAGNOSIS — E890 Postprocedural hypothyroidism: Secondary | ICD-10-CM

## 2020-03-18 DIAGNOSIS — Z794 Long term (current) use of insulin: Secondary | ICD-10-CM

## 2020-03-18 DIAGNOSIS — E785 Hyperlipidemia, unspecified: Secondary | ICD-10-CM

## 2020-03-18 DIAGNOSIS — E1169 Type 2 diabetes mellitus with other specified complication: Secondary | ICD-10-CM

## 2020-03-18 DIAGNOSIS — G8929 Other chronic pain: Secondary | ICD-10-CM

## 2020-03-18 DIAGNOSIS — E559 Vitamin D deficiency, unspecified: Secondary | ICD-10-CM

## 2020-03-18 DIAGNOSIS — E119 Type 2 diabetes mellitus without complications: Secondary | ICD-10-CM

## 2020-03-18 DIAGNOSIS — R32 Unspecified urinary incontinence: Secondary | ICD-10-CM

## 2020-03-18 DIAGNOSIS — F3132 Bipolar disorder, current episode depressed, moderate: Secondary | ICD-10-CM

## 2020-03-18 DIAGNOSIS — M545 Low back pain, unspecified: Secondary | ICD-10-CM

## 2020-03-18 DIAGNOSIS — R0789 Other chest pain: Secondary | ICD-10-CM

## 2020-03-18 DIAGNOSIS — G43909 Migraine, unspecified, not intractable, without status migrainosus: Secondary | ICD-10-CM

## 2020-03-18 LAB — GLUCOSE, POCT (MANUAL RESULT ENTRY): POC Glucose: 307 mg/dl — AB (ref 70–99)

## 2020-03-18 MED ORDER — ATORVASTATIN CALCIUM 10 MG PO TABS: 10.0000 mg | ORAL_TABLET | Freq: Every day | ORAL | 3 refills | Status: DC

## 2020-03-18 MED ORDER — HYDROXYZINE HCL 25 MG PO TABS: 25.0000 mg | ORAL_TABLET | Freq: Two times a day (BID) | ORAL | 2 refills | Status: DC

## 2020-03-18 MED ORDER — OMEPRAZOLE 40 MG PO CPDR: 40.0000 mg | DELAYED_RELEASE_CAPSULE | Freq: Two times a day (BID) | ORAL | 3 refills | Status: DC

## 2020-03-18 MED ORDER — GABAPENTIN 300 MG PO CAPS: ORAL_CAPSULE | ORAL | 3 refills | Status: DC

## 2020-03-18 MED ORDER — CITALOPRAM HYDROBROMIDE 10 MG PO TABS: 10.0000 mg | ORAL_TABLET | Freq: Every day | ORAL | 2 refills | Status: DC

## 2020-03-18 MED ORDER — LEVOTHYROXINE SODIUM 125 MCG PO TABS: 125.0000 ug | ORAL_TABLET | Freq: Every day | ORAL | 3 refills | Status: DC

## 2020-03-18 MED ORDER — METHOCARBAMOL 500 MG PO TABS: 500.0000 mg | ORAL_TABLET | Freq: Three times a day (TID) | ORAL | 0 refills | Status: DC | PRN

## 2020-03-18 MED ORDER — PIOGLITAZONE HCL 30 MG PO TABS: 30.0000 mg | ORAL_TABLET | Freq: Every day | ORAL | 2 refills | Status: DC

## 2020-03-18 MED ORDER — TOLTERODINE TARTRATE ER 4 MG PO CP24: 4.0000 mg | ORAL_CAPSULE | Freq: Every day | ORAL | 3 refills | Status: DC

## 2020-03-18 MED ORDER — PRAZOSIN HCL 1 MG PO CAPS: 1.0000 mg | ORAL_CAPSULE | Freq: Every day | ORAL | 2 refills | Status: DC

## 2020-03-18 MED ORDER — PROPRANOLOL HCL 40 MG PO TABS: 40.0000 mg | ORAL_TABLET | Freq: Two times a day (BID) | ORAL | 3 refills | Status: DC

## 2020-03-18 NOTE — Progress Notes (Signed)
Zoeya Gramajo, is a 49 y.o. female  PNT:[id number]  GQQ:[id number]  DOB - 05/21/1971  Subjective:  Chief Complaint and HPI: Naevia Unterreiner is a 49 y.o. female here today for med RF.  Out of actos.  Sees endocrine at [facility].  Last A1C=5.9 in April.  Just ate a hot dog and chips prior to this visit.   Says blood sugars at home running 130-180.  ROS:   Constitutional:  No f/c, No night sweats, No unexplained weight loss. EENT:  No vision changes, No blurry vision, No hearing changes. No mouth, throat, or ear problems.  Respiratory: No cough, No SOB Cardiac: No CP, no palpitations GI:  No abd pain, No N/V/D. GU: No Urinary s/sx Musculoskeletal: No joint pain Neuro: No headache, no dizziness, no motor weakness.  Skin: No rash Endocrine:  No polydipsia. No polyuria.  Psych: Denies SI/HI  No problems updated.  ALLERGIES: Allergies  Allergen Reactions  . Bee Venom   . Codeine Itching    Tolerates Hydrocodone OK.  . Ibuprofen Hives and Other (See Comments)    Happened in childhood  . Ibuprofen Hives and Swelling  . Metformin And Related Other (See Comments)    Muscle weakness  . Morphine Itching and Other (See Comments)    Throat swelling  . Penicillins     Childhood Allergy Has patient had a PCN reaction causing immediate rash, facial/tongue/throat swelling, SOB or lightheadedness with hypotension: Unknown Has patient had a PCN reaction causing severe rash involving mucus membranes or skin necrosis: Unknown Has patient had a PCN reaction that required hospitalization: Unknown Has patient had a PCN reaction occurring within the last 10 years: Unknown If all of the above answers are "NO", then may proceed with Cephalosporin use.   . Aspirin Hives, Swelling and Rash    PAST MEDICAL HISTORY: Past Medical History:  Diagnosis Date  . Anemia   . Anxiety   . Arthritis    "knees" (09/30/2015)  . Bipolar disorder ([place])   . Depression   . Diabetes mellitus without  complication ([place])   . Fibroid    s/p myomectomy 2010, hysterectomy 2013  . Grave's disease   . Heart murmur   . Hormone disorder   . Hypertension   . Hypothyroidism   . 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 Migraine     otc med prn  . PCOS (polycystic ovarian syndrome)   . PMS (premenstrual syndrome)   . PTSD (post-traumatic stress disorder)   . Seasonal allergies   . Thyroid cancer ([place]) 2005  . Thyroid disease   . Type II diabetes mellitus ([place])   . Vertigo     MEDICATIONS AT HOME: Prior to Admission medications   Medication Sig Start Date End Date Taking? Authorizing Provider  atorvastatin (LIPITOR) 10 MG tablet Take 1 tablet (10 mg total) by mouth daily. To lower cholesterol 03/18/20   Argentina Donovan, PA-C  cholecalciferol (VITAMIN D) 1000 UNITS tablet Take 2,000 Units by mouth daily.  03/14/12   Darrol Jump, MD  citalopram (CELEXA) 10 MG tablet Take 1 tablet (10 mg total) by mouth daily. 03/18/20   Argentina Donovan, PA-C  diclofenac sodium (VOLTAREN) 1 % GEL Apply up to 4 times daily to painful joint areas 10/03/18   Fulp, Cammie, MD  diclofenac Sodium (VOLTAREN) 1 % GEL Apply topically.    [provider]  gabapentin (NEURONTIN) 300 MG capsule Take one cap qd x  1 day, then one cap bix x 1 day and then one cap tid thereafter 03/18/20   Argentina Donovan, PA-C  hydrOXYzine (ATARAX/VISTARIL) 25 MG tablet Take 1 tablet (25 mg total) by mouth 2 (two) times daily. 03/18/20   Argentina Donovan, PA-C  insulin glargine (LANTUS SOLOSTAR) 100 UNIT/ML Solostar Pen Inject 38 Units into the skin daily. 11/04/19   Shamleffer, Melanie Crazier, MD  Insulin Pen Needle (TRUEPLUS PEN NEEDLES) 32G X 4 MM MISC Use as instructed to inject insulin. 04/25/18   Fulp, Ander Gaster, MD  levothyroxine (SYNTHROID) 125 MCG tablet Take 1 tablet (125 mcg total) by mouth daily before breakfast. 03/18/20   Argentina Donovan, PA-C  lithium carbonate (ESKALITH) 450 MG CR tablet  Take 2 capsules at bedtime 02/10/20   Nevada Crane, MD  loperamide (IMODIUM) 2 MG capsule Take 1 capsule (2 mg total) by mouth 4 (four) times daily as needed for diarrhea or loose stools. 02/19/20   Volney American, PA-C  lurasidone (LATUDA) 80 MG TABS tablet Take 1 tablet (80 mg total) by mouth at bedtime. 02/10/20   Nevada Crane, MD  methocarbamol (ROBAXIN) 500 MG tablet Take 1 tablet (500 mg total) by mouth 3 (three) times daily as needed for muscle spasms. 03/18/20   Nakisha Chai, Dionne Bucy, PA-C  NOVOLOG FLEXPEN 100 UNIT/ML FlexPen INJECT 22 UNITS INTO THE SKIN 3 TIMES A DAY WITH MEALS. 11/08/19   Shamleffer, Melanie Crazier, MD  omeprazole (PRILOSEC) 40 MG capsule Take 1 capsule (40 mg total) by mouth 2 (two) times daily. 03/18/20   Argentina Donovan, PA-C  ondansetron (ZOFRAN ODT) 4 MG disintegrating tablet Take 1 tablet (4 mg total) by mouth every 8 (eight) hours as needed for nausea or vomiting. 02/19/20   Volney American, PA-C  pioglitazone (ACTOS) 30 MG tablet Take 1 tablet (30 mg total) by mouth daily. 03/18/20   Argentina Donovan, PA-C  prazosin (MINIPRESS) 1 MG capsule Take 1 capsule (1 mg total) by mouth at bedtime. For sleep/nightmares 03/18/20   Argentina Donovan, PA-C  propranolol (INDERAL) 40 MG tablet Take 1 tablet (40 mg total) by mouth 2 (two) times daily. 03/18/20   Argentina Donovan, PA-C  tolterodine (DETROL LA) 4 MG 24 hr capsule Take 1 capsule (4 mg total) by mouth daily. 03/18/20   Argentina Donovan, PA-C  traMADol (ULTRAM) 50 MG tablet Take 1 tablet (50 mg total) by mouth every 6 (six) hours as needed. 10/08/18   Aundra Dubin, PA-C     Objective:  EXAM:   Vitals:   03/18/20 1542  BP: 118/77  Pulse: 83  Resp: 16  SpO2: 95%  Weight: (!) 304 lb 12.8 oz (138.3 kg)    General appearance : A&OX3. NAD. Non-toxic-appearing HEENT: Atraumatic and Normocephalic.  PERRLA. EOM intact.  Neck: supple, no JVD. No cervical lymphadenopathy. No thyromegaly Chest/Lungs:   Breathing-non-labored, Good air entry bilaterally, breath sounds normal without rales, rhonchi, or wheezing  CVS: S1 S2 regular, no murmurs, gallops, rubs  Extremities: Bilateral Lower Ext shows no edema, both legs are warm to touch with = pulse throughout Neurology:  CN II-XII grossly intact, Non focal.   Psych:  TP linear. J/I WNL. Normal speech. Appropriate eye contact and affect.  Skin:  No Rash  Data Review Lab Results  Component Value Date   HGBA1C 5.9 (A) 09/19/2019   HGBA1C 8.3 (A) 07/05/2019   HGBA1C 13.2 (A) 05/27/2019     Assessment & Plan   1.  Type 2 diabetes mellitus without complication, with long-term current use of insulin (HCC) Uncontrolled-followed by endocrine - Glucose (CBG) - Comprehensive metabolic panel - CBC with Differential/Platelet - pioglitazone (ACTOS) 30 MG tablet; Take 1 tablet (30 mg total) by mouth daily.  Dispense: 30 tablet; Refill: 2  2. Bipolar 1 disorder, depressed, moderate (HCC) Followed by behavioral health - Comprehensive metabolic panel - hydrOXYzine (ATARAX/VISTARIL) 25 MG tablet; Take 1 tablet (25 mg total) by mouth 2 (two) times daily.  Dispense: 60 tablet; Refill: 2 - citalopram (CELEXA) 10 MG tablet; Take 1 tablet (10 mg total) by mouth daily.  Dispense: 30 tablet; Refill: 2 - Lithium level - prazosin (MINIPRESS) 1 MG capsule; Take 1 capsule (1 mg total) by mouth at bedtime. For sleep/nightmares  Dispense: 30 capsule; Refill: 2  3. Hyperlipidemia associated with type 2 diabetes mellitus (HCC) - Lipid panel - atorvastatin (LIPITOR) 10 MG tablet; Take 1 tablet (10 mg total) by mouth daily. To lower cholesterol  Dispense: 90 tablet; Refill: 3  4. Chronic midline low back pain without sciatica - methocarbamol (ROBAXIN) 500 MG tablet; Take 1 tablet (500 mg total) by mouth 3 (three) times daily as needed for muscle spasms.  Dispense: 90 tablet; Refill: 0 - gabapentin (NEURONTIN) 300 MG capsule; Take one cap qd x 1 day, then one cap bix  x 1 day and then one cap tid thereafter  Dispense: 90 capsule; Refill: 3  5. Atypical chest pain - omeprazole (PRILOSEC) 40 MG capsule; Take 1 capsule (40 mg total) by mouth 2 (two) times daily.  Dispense: 60 capsule; Refill: 3  6. Urinary incontinence, unspecified type - tolterodine (DETROL LA) 4 MG 24 hr capsule; Take 1 capsule (4 mg total) by mouth daily.  Dispense: 30 capsule; Refill: 3  7. Migraine without status migrainosus, not intractable, unspecified migraine type - propranolol (INDERAL) 40 MG tablet; Take 1 tablet (40 mg total) by mouth 2 (two) times daily.  Dispense: 60 tablet; Refill: 3  8. Postoperative hypothyroidism - Thyroid Panel With TSH - levothyroxine (SYNTHROID) 125 MCG tablet; Take 1 tablet (125 mcg total) by mouth daily before breakfast.  Dispense: 30 tablet; Refill: 3  9. Vitamin D deficiency - Vitamin D, 25-hydroxy   Patient have been counseled extensively about nutrition and exercise  Return in about 3 months (around 06/18/2020) for with PCP.  The patient was given clear instructions to go to ER or return to medical center if symptoms don't improve, worsen or new problems develop. The patient verbalized understanding. The patient was told to call to get lab results if they haven't heard anything in the next week.     Freeman Caldron, PA-C [facility], [place]   03/18/2020, 4:50 PMPatient ID: [name], female   DOB: 08-Sep-1970, 49 y.o.   MRN: [id number]

## 2020-03-18 NOTE — [facility] Nurse Program (Signed)
  Dept: [facility] Nurse Program Note  Date of Encounter: 03/11/2020  Past Medical History: Past Medical History:  Diagnosis Date  . Anemia   . Anxiety   . Arthritis    "knees" (09/30/2015)  . Bipolar disorder ([place])   . Depression   . Diabetes mellitus without complication ([place])   . Fibroid    s/p myomectomy 2010, hysterectomy 2013  . Grave's disease   . Heart murmur   . Hormone disorder   . Hypertension   . Hypothyroidism   . 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 Migraine     otc med prn  . PCOS (polycystic ovarian syndrome)   . PMS (premenstrual syndrome)   . PTSD (post-traumatic stress disorder)   . Seasonal allergies   . Thyroid cancer ([place]) 2005  . Thyroid disease   . Type II diabetes mellitus ([place])   . Vertigo     Encounter Details:  CNP Questionnaire - 03/11/20 1433      Questionnaire   Visit Setting Other    Location Patient Served At Not Applicable    Patient Status Not Applicable    Medical Provider Yes    Insurance Unknown    Intervention Counsel;Educate    Referrals PCP - other provider    ED Visit Averted Yes          Flu shot given

## 2020-03-19 ENCOUNTER — Other Ambulatory Visit: Payer: Self-pay | Admitting: Physician Assistant

## 2020-03-19 LAB — CBC WITH DIFFERENTIAL/PLATELET
Basophils Absolute: 0.1 10*3/uL (ref 0.0–0.2)
Basos: 1 %
EOS (ABSOLUTE): 0.2 10*3/uL (ref 0.0–0.4)
Eos: 2 %
Hematocrit: 39.8 % (ref 34.0–46.6)
Hemoglobin: 13 g/dL (ref 11.1–15.9)
Immature Grans (Abs): 0 10*3/uL (ref 0.0–0.1)
Immature Granulocytes: 0 %
Lymphocytes Absolute: 3.3 10*3/uL — ABNORMAL HIGH (ref 0.7–3.1)
Lymphs: 36 %
MCH: 27.2 pg (ref 26.6–33.0)
MCHC: 32.7 g/dL (ref 31.5–35.7)
MCV: 83 fL (ref 79–97)
Monocytes Absolute: 0.6 10*3/uL (ref 0.1–0.9)
Monocytes: 7 %
Neutrophils Absolute: 4.9 10*3/uL (ref 1.4–7.0)
Neutrophils: 54 %
Platelets: 404 10*3/uL (ref 150–450)
RBC: 4.78 x10E6/uL (ref 3.77–5.28)
RDW: 13.8 % (ref 11.7–15.4)
WBC: 9.2 10*3/uL (ref 3.4–10.8)

## 2020-03-19 LAB — COMPREHENSIVE METABOLIC PANEL
ALT: 14 IU/L (ref 0–32)
AST: 14 IU/L (ref 0–40)
Albumin/Globulin Ratio: 1.6 (ref 1.2–2.2)
Albumin: 4.3 g/dL (ref 3.8–4.8)
Alkaline Phosphatase: 93 IU/L (ref 44–121)
BUN/Creatinine Ratio: 7 — ABNORMAL LOW (ref 9–23)
BUN: 5 mg/dL — ABNORMAL LOW (ref 6–24)
Bilirubin Total: 0.2 mg/dL (ref 0.0–1.2)
CO2: 22 mmol/L (ref 20–29)
Calcium: 9.4 mg/dL (ref 8.7–10.2)
Chloride: 99 mmol/L (ref 96–106)
Creatinine, Ser: 0.71 mg/dL (ref 0.57–1.00)
GFR calc Af Amer: 118 mL/min/{1.73_m2} (ref >59)
GFR calc non Af Amer: 102 mL/min/{1.73_m2} (ref >59)
Globulin, Total: 2.7 g/dL (ref 1.5–4.5)
Glucose: 351 mg/dL — ABNORMAL HIGH (ref 65–99)
Potassium: 4.3 mmol/L (ref 3.5–5.2)
Sodium: 136 mmol/L (ref 134–144)
Total Protein: 7 g/dL (ref 6.0–8.5)

## 2020-03-19 LAB — THYROID PANEL WITH TSH
Free Thyroxine Index: 2.9 (ref 1.2–4.9)
T3 Uptake Ratio: 30 % (ref 24–39)
T4, Total: 9.6 ug/dL (ref 4.5–12.0)
TSH: 3.17 u[IU]/mL (ref 0.450–4.500)

## 2020-03-19 LAB — LIPID PANEL
Chol/HDL Ratio: 3.2 ratio (ref 0.0–4.4)
Cholesterol, Total: 161 mg/dL (ref 100–199)
HDL: 51 mg/dL (ref >39)
LDL Chol Calc (NIH): 86 mg/dL (ref 0–99)
Triglycerides: 139 mg/dL (ref 0–149)
VLDL Cholesterol Cal: 24 mg/dL (ref 5–40)

## 2020-03-19 LAB — LITHIUM LEVEL: Lithium Lvl: <0.1 mmol/L — ABNORMAL LOW (ref 0.5–1.2)

## 2020-03-19 LAB — VITAMIN D 25 HYDROXY (VIT D DEFICIENCY, FRACTURES): Vit D, 25-Hydroxy: 19.4 ng/mL — ABNORMAL LOW (ref 30.0–100.0)

## 2020-03-19 MED ORDER — VITAMIN D (ERGOCALCIFEROL) 1.25 MG (50000 UNIT) PO CAPS: 50000.0000 [IU] | ORAL_CAPSULE | ORAL | 0 refills | Status: DC

## 2020-03-19 NOTE — Progress Notes (Signed)
Pt name and DOB verified. Patient aware of results and result note from [facility]. Patient aware medications were sent to pharmacy and will f/u with [facility] and Endo.

## 2020-04-10 ENCOUNTER — Ambulatory Visit: Payer: No Typology Code available for payment source | Admitting: Internal Medicine

## 2020-04-28 ENCOUNTER — Other Ambulatory Visit: Payer: Self-pay

## 2020-04-28 ENCOUNTER — Encounter (HOSPITAL_COMMUNITY): Payer: Self-pay | Admitting: Psychiatry

## 2020-04-28 ENCOUNTER — Ambulatory Visit (INDEPENDENT_AMBULATORY_CARE_PROVIDER_SITE_OTHER): Payer: No Payment, Other | Admitting: Psychiatry

## 2020-04-28 ENCOUNTER — Other Ambulatory Visit (HOSPITAL_COMMUNITY): Payer: Self-pay | Admitting: Psychiatry

## 2020-04-28 VITALS — BP 136/83 | HR 78 | Ht 70.0 in | Wt 296.0 lb

## 2020-04-28 DIAGNOSIS — F3132 Bipolar disorder, current episode depressed, moderate: Secondary | ICD-10-CM | POA: Diagnosis not present

## 2020-04-28 MED ORDER — PRAZOSIN HCL 1 MG PO CAPS: 1.0000 mg | ORAL_CAPSULE | Freq: Every day | ORAL | 2 refills | Status: DC

## 2020-04-28 MED ORDER — LITHIUM CARBONATE ER 450 MG PO TBCR: EXTENDED_RELEASE_TABLET | ORAL | 2 refills | Status: DC

## 2020-04-28 MED ORDER — CITALOPRAM HYDROBROMIDE 20 MG PO TABS: 20.0000 mg | ORAL_TABLET | Freq: Every day | ORAL | 2 refills | Status: DC

## 2020-04-28 MED ORDER — LURASIDONE HCL 80 MG PO TABS: 80.0000 mg | ORAL_TABLET | Freq: Every day | ORAL | 2 refills | Status: DC

## 2020-04-28 MED ORDER — HYDROXYZINE HCL 25 MG PO TABS: 25.0000 mg | ORAL_TABLET | Freq: Two times a day (BID) | ORAL | 2 refills | Status: DC
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
## 2020-05-04 ENCOUNTER — Other Ambulatory Visit: Payer: Self-pay | Admitting: Pharmacist

## 2020-05-04 DIAGNOSIS — G8929 Other chronic pain: Secondary | ICD-10-CM

## 2020-05-04 MED ORDER — METHOCARBAMOL 500 MG PO TABS: 500.0000 mg | ORAL_TABLET | Freq: Three times a day (TID) | ORAL | 0 refills | Status: DC | PRN

## 2020-05-13 ENCOUNTER — Ambulatory Visit: Payer: No Typology Code available for payment source | Admitting: Internal Medicine

## 2020-06-18 ENCOUNTER — Ambulatory Visit: Payer: No Typology Code available for payment source | Admitting: Family Medicine

## 2020-06-22 ENCOUNTER — Encounter (HOSPITAL_COMMUNITY): Payer: Self-pay | Admitting: Psychiatry

## 2020-06-22 ENCOUNTER — Other Ambulatory Visit (HOSPITAL_COMMUNITY): Payer: Self-pay | Admitting: Psychiatry

## 2020-06-22 ENCOUNTER — Ambulatory Visit (INDEPENDENT_AMBULATORY_CARE_PROVIDER_SITE_OTHER): Payer: No Payment, Other | Admitting: Psychiatry

## 2020-06-22 ENCOUNTER — Other Ambulatory Visit: Payer: Self-pay

## 2020-06-22 DIAGNOSIS — F3176 Bipolar disorder, in full remission, most recent episode depressed: Secondary | ICD-10-CM | POA: Diagnosis not present

## 2020-06-22 DIAGNOSIS — F3132 Bipolar disorder, current episode depressed, moderate: Secondary | ICD-10-CM

## 2020-06-22 MED ORDER — PRAZOSIN HCL 1 MG PO CAPS: 1.0000 mg | ORAL_CAPSULE | Freq: Every day | ORAL | 2 refills | Status: DC

## 2020-06-22 MED ORDER — CITALOPRAM HYDROBROMIDE 20 MG PO TABS: 20.0000 mg | ORAL_TABLET | Freq: Every day | ORAL | 2 refills | Status: DC

## 2020-06-22 MED ORDER — LITHIUM CARBONATE ER 450 MG PO TBCR
EXTENDED_RELEASE_TABLET | ORAL | 2 refills | Status: DC
Start: 2020-06-22 — End: 2020-06-22

## 2020-06-22 MED ORDER — LURASIDONE HCL 80 MG PO TABS: 80.0000 mg | ORAL_TABLET | Freq: Every day | ORAL | 2 refills | Status: DC

## 2020-06-22 MED ORDER — HYDROXYZINE HCL 25 MG PO TABS: 25.0000 mg | ORAL_TABLET | Freq: Two times a day (BID) | ORAL | 2 refills | Status: DC

## 2020-06-22 NOTE — Progress Notes (Signed)
[facility] Progress Note    Patient Identification: Lynn Martinez MRN:  [id number] Date of Evaluation:  06/22/2020    Chief Complaint:   " I am doing much better."  Visit Diagnosis:    ICD-10-CM   1. Bipolar 1 disorder, depressed, full remission ([place])  F31.76     History of Present Illness: Patient reported that she is doing much better compared to the past. She informed that she does not think she needs any further adjustment in her medicine doses. She stated that she is still dealing with some ongoing stressors but she knows that there will always be something. She stated that her husband is still dealing with addiction issues and she is trying to support him as much as possible. She stated that her position reported ended and her supervisors saw her board and decided to move her to a different position and she is doing really well in that area. She stated that this helped her self-esteem and confidence. She informed that she had a great Christmas with her family and everything went well during the holiday season.  She stated that she knows that she needs to learn how to deal with the stressors better and that is the only way that she can continue to progress.  Past Psychiatric History: Bipolar d/o, anxiety   Past Medical History:  Past Medical History:  Diagnosis Date  . Anemia   . Anxiety   . Arthritis    "knees" (09/30/2015)  . Bipolar disorder ([place])   . Depression   . Diabetes mellitus without complication ([place])   . Fibroid    s/p myomectomy 2010, hysterectomy 2013  . Grave's disease   . Heart murmur   . Hormone disorder   . Hypertension   . Hypothyroidism   . 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 Migraine     otc med prn  . PCOS (polycystic ovarian syndrome)   . PMS (premenstrual syndrome)   . PTSD (post-traumatic stress disorder)   . Seasonal allergies   . Thyroid cancer ([place]) 2005  . Thyroid disease   . Type II diabetes  mellitus ([place])   . Vertigo     Past Surgical History:  Procedure Laterality Date  . ABDOMINAL HYSTERECTOMY  10/11/2011   Procedure: HYSTERECTOMY ABDOMINAL;  Surgeon: Terrance Mass, MD;  Location: [facility];  Service: Gynecology;  Laterality: N/A;  With Repair of serosa.  . ABDOMINAL HYSTERECTOMY    . CHOLECYSTECTOMY N/A 09/06/2013   Procedure: LAPAROSCOPIC CHOLECYSTECTOMY WITH INTRAOPERATIVE CHOLANGIOGRAM;  Surgeon: Merrie Roof, MD;  Location: [facility];  Service: General;  Laterality: N/A;  . DIAGNOSTIC LAPAROSCOPY    . DILATION AND CURETTAGE OF UTERUS    . HERNIA REPAIR    . MYOMECTOMY  2010   LAPAROSCOPY  . thyroid removed    . TOTAL THYROIDECTOMY  2006  . UMBILICAL HERNIA REPAIR  1982    Family Psychiatric History: denied  Family History:  Family History  Problem Relation Age of Onset  . Thyroid disease Mother   . Diabetes Father   . Hypertension Father   . Heart disease Father   . Heart attack Father   . Cancer Maternal Aunt        BRAIN  . Cancer Maternal Grandmother        LYMPHOMA  . Hypertension Maternal Grandmother   . Cancer Paternal Grandmother        PANCREATIC  .  Diabetes Paternal Grandmother   . Hypertension Paternal Grandmother   . Hypertension Maternal Grandfather   . Diabetes Maternal Grandfather   . Hypertension Paternal Grandfather   . Pancreatic cancer Paternal Grandfather     Social History:   Social History   Socioeconomic History  . Marital status: Single    Spouse name: Not on file  . Number of children: Not on file  . Years of education: Not on file  . Highest education level: 12th grade  Occupational History  . Not on file  Tobacco Use  . Smoking status: Never Smoker  . Smokeless tobacco: Never Used  Vaping Use  . Vaping Use: Never used  Substance and Sexual Activity  . Alcohol use: Never  . Drug use: Never  . Sexual activity: Not Currently    Birth control/protection: Surgical  Other Topics Concern  . Not on file  Social  History Narrative   ** Merged History Encounter **       Social Determinants of Health   Financial Resource Strain: Not on file  Food Insecurity: Not on file  Transportation Needs: Not on file  Physical Activity: Not on file  Stress: Not on file  Social Connections: Not on file    Additional Social History: Lives with husband. Works at [employer] center. Husband is a retired English as a second language teacher. Has no children.  Allergies:   Allergies  Allergen Reactions  . Bee Venom   . Codeine Itching    Tolerates Hydrocodone OK.  . Ibuprofen Hives and Other (See Comments)    Happened in childhood  . Ibuprofen Hives and Swelling  . Metformin And Related Other (See Comments)    Muscle weakness  . Morphine Itching and Other (See Comments)    Throat swelling  . Penicillins     Childhood Allergy Has patient had a PCN reaction causing immediate rash, facial/tongue/throat swelling, SOB or lightheadedness with hypotension: Unknown Has patient had a PCN reaction causing severe rash involving mucus membranes or skin necrosis: Unknown Has patient had a PCN reaction that required hospitalization: Unknown Has patient had a PCN reaction occurring within the last 10 years: Unknown If all of the above answers are "NO", then may proceed with Cephalosporin use.   . Aspirin Hives, Swelling and Rash    Metabolic Disorder Labs: Lab Results  Component Value Date   HGBA1C 5.9 (A) 09/19/2019   MPG 214 09/30/2015   MPG 123 (H) 03/08/2012   No results found for: PROLACTIN Lab Results  Component Value Date   CHOL 161 03/18/2020   TRIG 139 03/18/2020   HDL 51 03/18/2020   CHOLHDL 3.2 03/18/2020   VLDL 22 09/30/2015   LDLCALC 86 03/18/2020   LDLCALC 105 (H) 02/28/2019   Lab Results  Component Value Date   TSH 3.170 03/18/2020    Therapeutic Level Labs: Lab Results  Component Value Date   LITHIUM <0.1 (L) 03/18/2020   No results found for: CBMZ No results found for: VALPROATE  Current  Medications: Current Outpatient Medications  Medication Sig Dispense Refill  . atorvastatin (LIPITOR) 10 MG tablet Take 1 tablet (10 mg total) by mouth daily. To lower cholesterol 90 tablet 3  . cholecalciferol (VITAMIN D) 1000 UNITS tablet Take 2,000 Units by mouth daily.     . citalopram (CELEXA) 20 MG tablet Take 1 tablet (20 mg total) by mouth daily. 30 tablet 2  . diclofenac sodium (VOLTAREN) 1 % GEL Apply up to 4 times daily to painful joint areas 1  Tube 6  . diclofenac Sodium (VOLTAREN) 1 % GEL Apply topically.    . gabapentin (NEURONTIN) 300 MG capsule Take one cap qd x 1 day, then one cap bix x 1 day and then one cap tid thereafter 90 capsule 3  . hydrOXYzine (ATARAX/VISTARIL) 25 MG tablet Take 1 tablet (25 mg total) by mouth 2 (two) times daily. 60 tablet 2  . insulin glargine (LANTUS SOLOSTAR) 100 UNIT/ML Solostar Pen Inject 38 Units into the skin daily. 15 mL 11  . Insulin Pen Needle (TRUEPLUS PEN NEEDLES) 32G X 4 MM MISC Use as instructed to inject insulin. 100 each 11  . levothyroxine (SYNTHROID) 125 MCG tablet Take 1 tablet (125 mcg total) by mouth daily before breakfast. 30 tablet 3  . lithium carbonate (ESKALITH) 450 MG CR tablet Take 2 capsules at bedtime 60 tablet 2  . loperamide (IMODIUM) 2 MG capsule Take 1 capsule (2 mg total) by mouth 4 (four) times daily as needed for diarrhea or loose stools. 24 capsule 0  . lurasidone (LATUDA) 80 MG TABS tablet Take 1 tablet (80 mg total) by mouth at bedtime. 30 tablet 2  . methocarbamol (ROBAXIN) 500 MG tablet Take 1 tablet (500 mg total) by mouth 3 (three) times daily as needed for muscle spasms. 90 tablet 0  . NOVOLOG FLEXPEN 100 UNIT/ML FlexPen INJECT 22 UNITS INTO THE SKIN 3 TIMES A DAY WITH MEALS. 18 mL 3  . omeprazole (PRILOSEC) 40 MG capsule Take 1 capsule (40 mg total) by mouth 2 (two) times daily. 60 capsule 3  . ondansetron (ZOFRAN ODT) 4 MG disintegrating tablet Take 1 tablet (4 mg total) by mouth every 8 (eight) hours as  needed for nausea or vomiting. 21 tablet 0  . pioglitazone (ACTOS) 30 MG tablet Take 1 tablet (30 mg total) by mouth daily. 30 tablet 2  . prazosin (MINIPRESS) 1 MG capsule Take 1 capsule (1 mg total) by mouth at bedtime. For sleep/nightmares 30 capsule 2  . propranolol (INDERAL) 40 MG tablet Take 1 tablet (40 mg total) by mouth 2 (two) times daily. 60 tablet 3  . tolterodine (DETROL LA) 4 MG 24 hr capsule Take 1 capsule (4 mg total) by mouth daily. 30 capsule 3  . traMADol (ULTRAM) 50 MG tablet Take 1 tablet (50 mg total) by mouth every 6 (six) hours as needed. 30 tablet 0  . Vitamin D, Ergocalciferol, (DRISDOL) 1.25 MG (50000 UNIT) CAPS capsule Take 1 capsule (50,000 Units total) by mouth every 7 (seven) days. 16 capsule 0   No current facility-administered medications for this visit.     Psychiatric Specialty Exam: Review of Systems  Last menstrual period 09/20/2011.There is no height or weight on file to calculate BMI.  General Appearance: Fairly Groomed  Eye Contact:  Good  Speech:  Clear and Coherent and Normal Rate  Volume:  Normal  Mood:  Euthymic  Affect:  Congruent  Thought Process:  Goal Directed and Descriptions of Associations: Intact  Orientation:  Full (Time, Place, and Person)  Thought Content:  Logical  Suicidal Thoughts:  No  Homicidal Thoughts:  No  Memory:  Immediate;   Good Recent;   Good  Judgement:  Fair  Insight:  Fair  Psychomotor Activity:  Normal  Concentration:  Concentration: Good and Attention Span: Good  Recall:  Good  Fund of Knowledge:Good  Language: Good  Akathisia:  Negative  Handed:  Right  AIMS (if indicated):  Not done  Assets:  Communication Skills Desire for Improvement  Financial Resources/Insurance Housing  ADL's:  Intact  Cognition: WNL  Sleep:  Fair   Screenings: GAD-7   Personnel officer Visit from 03/18/2020 in [facility] Office Visit from 02/28/2019 in [facility] Office Visit from 10/03/2018 in [facility] Office Visit from 07/04/2018 in [facility] Office Visit from 05/23/2018 in [facility]  Total GAD-7 Score 14 12 15 14 21     PHQ2-9   [facility] Office Visit from 03/18/2020 in [facility] Office Visit from 07/03/2019 in [facility] Office Visit from 02/28/2019 in [facility] Office Visit from 10/03/2018 in [facility] Office Visit from 07/04/2018 in [facility]  PHQ-2 Total Score 4 2 4 1 4   PHQ-9 Total Score 14 9 14 7 14       Assessment and Plan: Patient appears to be doing well on her current regimen.  1. Bipolar 1 disorder, depressed, moderate (HCC)  - hydrOXYzine (ATARAX/VISTARIL) 25 MG tablet; Take 1 tablet (25 mg total) by mouth 2 (two) times daily.  Dispense: 60 tablet; Refill: 2 - lurasidone (LATUDA) 80 MG TABS tablet; Take 1 tablet (80 mg total) by mouth at bedtime.  Dispense: 30 tablet; Refill: 2 - lithium carbonate (ESKALITH) 450 MG CR tablet; Take 2 capsules at bedtime  Dispense: 60 tablet; Refill: 2 - prazosin (MINIPRESS) 1 MG capsule; Take 1 capsule (1 mg total) by mouth at bedtime. For sleep/nightmares  Dispense: 30 capsule; Refill: 2 - citalopram (CELEXA) 20 MG tablet; Take 1 tablet (20 mg total) by mouth daily.  Dispense: 30 tablet; Refill: 2   Continue same medication regimen. Follow up in 3 months.     [place] Crane, MD 1/10/20221:09 PM

## 2020-07-09 ENCOUNTER — Ambulatory Visit: Payer: Self-pay | Attending: Critical Care Medicine | Admitting: Critical Care Medicine

## 2020-07-09 ENCOUNTER — Other Ambulatory Visit: Payer: Self-pay | Admitting: Critical Care Medicine

## 2020-07-09 ENCOUNTER — Encounter: Payer: Self-pay | Admitting: Critical Care Medicine

## 2020-07-09 ENCOUNTER — Other Ambulatory Visit: Payer: Self-pay | Admitting: *Deleted

## 2020-07-09 ENCOUNTER — Other Ambulatory Visit: Payer: Self-pay

## 2020-07-09 VITALS — BP 137/88 | HR 82 | Resp 16 | Wt 296.0 lb

## 2020-07-09 DIAGNOSIS — M5412 Radiculopathy, cervical region: Secondary | ICD-10-CM

## 2020-07-09 DIAGNOSIS — I10 Essential (primary) hypertension: Secondary | ICD-10-CM

## 2020-07-09 DIAGNOSIS — Z5181 Encounter for therapeutic drug level monitoring: Secondary | ICD-10-CM

## 2020-07-09 DIAGNOSIS — E119 Type 2 diabetes mellitus without complications: Secondary | ICD-10-CM

## 2020-07-09 DIAGNOSIS — Z1211 Encounter for screening for malignant neoplasm of colon: Secondary | ICD-10-CM

## 2020-07-09 DIAGNOSIS — Z114 Encounter for screening for human immunodeficiency virus [HIV]: Secondary | ICD-10-CM

## 2020-07-09 DIAGNOSIS — F3176 Bipolar disorder, in full remission, most recent episode depressed: Secondary | ICD-10-CM

## 2020-07-09 DIAGNOSIS — E890 Postprocedural hypothyroidism: Secondary | ICD-10-CM

## 2020-07-09 DIAGNOSIS — E1165 Type 2 diabetes mellitus with hyperglycemia: Secondary | ICD-10-CM

## 2020-07-09 DIAGNOSIS — R32 Unspecified urinary incontinence: Secondary | ICD-10-CM

## 2020-07-09 DIAGNOSIS — G43909 Migraine, unspecified, not intractable, without status migrainosus: Secondary | ICD-10-CM

## 2020-07-09 DIAGNOSIS — E559 Vitamin D deficiency, unspecified: Secondary | ICD-10-CM

## 2020-07-09 DIAGNOSIS — E1169 Type 2 diabetes mellitus with other specified complication: Secondary | ICD-10-CM

## 2020-07-09 DIAGNOSIS — G8929 Other chronic pain: Secondary | ICD-10-CM

## 2020-07-09 DIAGNOSIS — R0789 Other chest pain: Secondary | ICD-10-CM

## 2020-07-09 DIAGNOSIS — E785 Hyperlipidemia, unspecified: Secondary | ICD-10-CM

## 2020-07-09 DIAGNOSIS — M545 Low back pain, unspecified: Secondary | ICD-10-CM

## 2020-07-09 DIAGNOSIS — Z1159 Encounter for screening for other viral diseases: Secondary | ICD-10-CM

## 2020-07-09 DIAGNOSIS — Z794 Long term (current) use of insulin: Secondary | ICD-10-CM

## 2020-07-09 DIAGNOSIS — F3132 Bipolar disorder, current episode depressed, moderate: Secondary | ICD-10-CM

## 2020-07-09 DIAGNOSIS — Z6841 Body Mass Index (BMI) 40.0 and over, adult: Secondary | ICD-10-CM

## 2020-07-09 LAB — GLUCOSE, POCT (MANUAL RESULT ENTRY): POC Glucose: 329 mg/dl — AB (ref 70–99)

## 2020-07-09 LAB — POCT GLYCOSYLATED HEMOGLOBIN (HGB A1C): HbA1c, POC (controlled diabetic range): 13.7 % — AB (ref 0.0–7.0)

## 2020-07-09 MED ORDER — GABAPENTIN 300 MG PO CAPS: 600.0000 mg | ORAL_CAPSULE | Freq: Every day | ORAL | 2 refills | Status: DC

## 2020-07-09 MED ORDER — METHOCARBAMOL 500 MG PO TABS: 500.0000 mg | ORAL_TABLET | Freq: Three times a day (TID) | ORAL | 0 refills | Status: DC | PRN

## 2020-07-09 MED ORDER — HYDROXYZINE HCL 25 MG PO TABS: 25.0000 mg | ORAL_TABLET | Freq: Two times a day (BID) | ORAL | 2 refills | Status: DC

## 2020-07-09 MED ORDER — TOLTERODINE TARTRATE ER 4 MG PO CP24: 4.0000 mg | ORAL_CAPSULE | Freq: Every day | ORAL | 3 refills | Status: DC

## 2020-07-09 MED ORDER — PROPRANOLOL HCL 40 MG PO TABS: 40.0000 mg | ORAL_TABLET | Freq: Two times a day (BID) | ORAL | 3 refills | Status: DC

## 2020-07-09 MED ORDER — PIOGLITAZONE HCL 30 MG PO TABS: 30.0000 mg | ORAL_TABLET | Freq: Every day | ORAL | 2 refills | Status: DC

## 2020-07-09 MED ORDER — VITAMIN D (ERGOCALCIFEROL) 1.25 MG (50000 UNIT) PO CAPS: 50000.0000 [IU] | ORAL_CAPSULE | ORAL | 1 refills | Status: DC

## 2020-07-09 MED ORDER — TRUEPLUS PEN NEEDLES 32G X 4 MM MISC: 11 refills | Status: DC

## 2020-07-09 MED ORDER — LANTUS SOLOSTAR 100 UNIT/ML ~~LOC~~ SOPN: 50.0000 [IU] | PEN_INJECTOR | Freq: Every day | SUBCUTANEOUS | Status: DC

## 2020-07-09 MED ORDER — LEVOTHYROXINE SODIUM 125 MCG PO TABS: 125.0000 ug | ORAL_TABLET | Freq: Every day | ORAL | 3 refills | Status: DC

## 2020-07-09 MED ORDER — OMEPRAZOLE 40 MG PO CPDR: 40.0000 mg | DELAYED_RELEASE_CAPSULE | Freq: Two times a day (BID) | ORAL | 3 refills | Status: DC

## 2020-07-09 MED ORDER — NOVOLOG FLEXPEN 100 UNIT/ML ~~LOC~~ SOPN: PEN_INJECTOR | SUBCUTANEOUS | 3 refills | Status: DC

## 2020-07-09 MED ORDER — ATORVASTATIN CALCIUM 10 MG PO TABS: 10.0000 mg | ORAL_TABLET | Freq: Every day | ORAL | 3 refills | Status: DC

## 2020-07-09 NOTE — Assessment & Plan Note (Signed)
Significant bipolar disorder under management by behavioral health will obtain lithium levels at this visit
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
## 2020-07-09 NOTE — Assessment & Plan Note (Signed)
Type 2 diabetes with hyperglycemia long-term use of insulin A1c of 13.7  Patient followed closely by endocrinology  We will continue Lantus at 50 units daily Humalog 22 units 3 times daily and Actos 30 mg daily  I plan to refer this patient to medical nutritionist  Plan also is to have the patient follow-up closer diabetic diet with the medical nutritionist referral  Refills on all medicines were given

## 2020-07-09 NOTE — Assessment & Plan Note (Signed)
Hypertension under adequate control at this time we will continue Inderal refills were given

## 2020-07-09 NOTE — Patient Instructions (Signed)
Refills on all medications sent to her pharmacy I sent the vitamin D 50,000 units weekly to the [facility] outpatient pharmacy under the patient assistance plan  Multiple labs ordered today and a stool kit will be obtained to check you for colon cancer  Please keep upcoming appointments with your mental health provider and your endocrinology Dr.  Return to see Dr. Joya Gaskins in 2 months  I made a referral to physical therapy and to the nutritionist  Follow the diet as outlined below  Please be careful when you go to these local foot salons as they may damage her feet they are not often licensed to care for diabetic feet Please use [place] bees foot cream generously in your feet   Diabetes Mellitus and Exercise Exercising regularly is important for overall health, especially for people who have diabetes mellitus. Exercising is not only about losing weight. It has many other health benefits, such as increasing muscle strength and bone density and reducing body fat and stress. This leads to improved fitness, flexibility, and endurance, all of which result in better overall health. What are the benefits of exercise if I have diabetes? Exercise has many benefits for people with diabetes. They include:  Helping to lower and control blood sugar (glucose).  Helping the body to respond better to the hormone insulin by improving insulin sensitivity.  Reducing how much insulin the body needs.  Lowering the risk for heart disease by: ? Lowering "bad" cholesterol and triglyceride levels. ? Increasing "good" cholesterol levels. ? Lowering blood pressure. ? Lowering blood glucose levels. What is my activity plan? Your health care provider or certified diabetes educator can help you make a plan for the type and frequency of exercise that works for you. This is called your activity plan. Be sure to:  Get at least 150 minutes of medium-intensity or high-intensity exercise each week. Exercises may include  brisk walking, biking, or water aerobics.  Do stretching and strengthening exercises, such as yoga or weight lifting, at least 2 times a week.  Spread out your activity over at least 3 days of the week.  Get some form of physical activity each day. ? Do not go more than 2 days in a row without some kind of physical activity. ? Avoid being inactive for more than 90 minutes at a time. Take frequent breaks to walk or stretch.  Choose exercises or activities that you enjoy. Set realistic goals.  Start slowly and gradually increase your exercise intensity over time.   How do I manage my diabetes during exercise? Monitor your blood glucose  Check your blood glucose before and after exercising. If your blood glucose is: ? 240 mg/dL (13.3 mmol/L) or higher before you exercise, check your urine for ketones. These are chemicals created by the liver. If you have ketones in your urine, do not exercise until your blood glucose returns to normal. ? 100 mg/dL (5.6 mmol/L) or lower, eat a snack containing 15-20 grams of carbohydrate. Check your blood glucose 15 minutes after the snack to make sure that your glucose level is above 100 mg/dL (5.6 mmol/L) before you start your exercise.  Know the symptoms of low blood glucose (hypoglycemia) and how to treat it. Your risk for hypoglycemia increases during and after exercise. Follow these tips and your health care provider's instructions  Keep a carbohydrate snack that is fast-acting for use before, during, and after exercise to help prevent or treat hypoglycemia.  Avoid injecting insulin into areas of the body  that are going to be exercised. For example, avoid injecting insulin into: ? Your arms, when you are about to play tennis. ? Your legs, when you are about to go jogging.  Keep records of your exercise habits. Doing this can help you and your health care provider adjust your diabetes management plan as needed. Write down: ? Food that you eat before  and after you exercise. ? Blood glucose levels before and after you exercise. ? The type and amount of exercise you have done.  Work with your health care provider when you start a new exercise or activity. He or she may need to: ? Make sure that the activity is safe for you. ? Adjust your insulin, other medicines, and food that you eat.  Drink plenty of water while you exercise. This prevents loss of water (dehydration) and problems caused by a lot of heat in the body (heat stroke).   Where to find more information  American Diabetes Association: www.diabetes.org Summary  Exercising regularly is important for overall health, especially for people who have diabetes mellitus.  Exercising has many health benefits. It increases muscle strength and bone density and reduces body fat and stress. It also lowers and controls blood glucose.  Your health care provider or certified diabetes educator can help you make an activity plan for the type and frequency of exercise that works for you.  Work with your health care provider to make sure any new activity is safe for you. Also work with your health care provider to adjust your insulin, other medicines, and the food you eat. This information is not intended to replace advice given to you by your health care provider. Make sure you discuss any questions you have with your health care provider. Document Revised: 02/25/2019 Document Reviewed: 02/25/2019 Elsevier Patient Education  [place].

## 2020-07-09 NOTE — Assessment & Plan Note (Signed)
Vitamin D deficiency  Will send vitamin D 50,000 units weekly to [facility] outpatient pharmacy under patient assistance find

## 2020-07-09 NOTE — Progress Notes (Signed)
3 month f/u DM Labs  Generalized body pain- head, bacm, knees, ankles, abdomen  Multiple falls at home

## 2020-07-09 NOTE — Assessment & Plan Note (Signed)
Hypothyroidism we will follow-up thyroid function levels continue Synthroid at current dose level

## 2020-07-10 ENCOUNTER — Telehealth: Payer: Self-pay

## 2020-07-10 NOTE — Telephone Encounter (Signed)
Patient left message, requesting return call regarding scheduling a [facility]CP appointment. Left message with husband requesting return call.

## 2020-07-11 LAB — COMPREHENSIVE METABOLIC PANEL
ALT: 18 IU/L (ref 0–32)
AST: 11 IU/L (ref 0–40)
Albumin/Globulin Ratio: 1.3 (ref 1.2–2.2)
Albumin: 4.3 g/dL (ref 3.8–4.8)
Alkaline Phosphatase: 93 IU/L (ref 44–121)
BUN/Creatinine Ratio: 8 — ABNORMAL LOW (ref 9–23)
BUN: 6 mg/dL (ref 6–24)
Bilirubin Total: 0.2 mg/dL (ref 0.0–1.2)
CO2: 21 mmol/L (ref 20–29)
Calcium: 9.8 mg/dL (ref 8.7–10.2)
Chloride: 99 mmol/L (ref 96–106)
Creatinine, Ser: 0.76 mg/dL (ref 0.57–1.00)
GFR calc Af Amer: 108 mL/min/{1.73_m2} (ref >59)
GFR calc non Af Amer: 94 mL/min/{1.73_m2} (ref >59)
Globulin, Total: 3.2 g/dL (ref 1.5–4.5)
Glucose: 338 mg/dL — ABNORMAL HIGH (ref 65–99)
Potassium: 4.6 mmol/L (ref 3.5–5.2)
Sodium: 137 mmol/L (ref 134–144)
Total Protein: 7.5 g/dL (ref 6.0–8.5)

## 2020-07-11 LAB — LIPID PANEL
Chol/HDL Ratio: 3 ratio (ref 0.0–4.4)
Cholesterol, Total: 172 mg/dL (ref 100–199)
HDL: 58 mg/dL (ref >39)
LDL Chol Calc (NIH): 93 mg/dL (ref 0–99)
Triglycerides: 117 mg/dL (ref 0–149)
VLDL Cholesterol Cal: 21 mg/dL (ref 5–40)

## 2020-07-11 LAB — THYROID PANEL WITH TSH
Free Thyroxine Index: 1.1 — ABNORMAL LOW (ref 1.2–4.9)
T3 Uptake Ratio: 20 % — ABNORMAL LOW (ref 24–39)
T4, Total: 5.7 ug/dL (ref 4.5–12.0)
TSH: 8.11 u[IU]/mL — ABNORMAL HIGH (ref 0.450–4.500)

## 2020-07-11 LAB — HCV AB W REFLEX TO QUANT PCR: HCV Ab: <0.1 s/co ratio (ref 0.0–0.9)

## 2020-07-11 LAB — HIV ANTIBODY (ROUTINE TESTING W REFLEX): HIV Screen 4th Generation wRfx: NONREACTIVE

## 2020-07-11 LAB — LITHIUM LEVEL: Lithium Lvl: 0.6 mmol/L (ref 0.5–1.2)

## 2020-07-11 LAB — HCV INTERPRETATION

## 2020-07-15 ENCOUNTER — Other Ambulatory Visit: Payer: Self-pay | Admitting: Critical Care Medicine

## 2020-07-15 DIAGNOSIS — R195 Other fecal abnormalities: Secondary | ICD-10-CM

## 2020-07-15 LAB — FECAL OCCULT BLOOD, IMMUNOCHEMICAL: Fecal Occult Bld: POSITIVE — AB

## 2020-07-17 ENCOUNTER — Other Ambulatory Visit: Payer: Self-pay | Admitting: Obstetrics and Gynecology

## 2020-07-17 DIAGNOSIS — Z1231 Encounter for screening mammogram for malignant neoplasm of breast: Secondary | ICD-10-CM

## 2020-07-22 ENCOUNTER — Telehealth: Payer: Self-pay | Admitting: Critical Care Medicine

## 2020-07-22 NOTE — Telephone Encounter (Signed)
Copied from [place] [phone]. Topic: General - Other >> Jul 22, 2020  1:08 PM Mcneil, Ja-Kwan wrote: Reason for CRM: Pt stated she received results of stool sample and she was advised that a colonoscopy is needed. Pt stated she recently got insurance that began on 07/14/20. Pt requests call back regarding referral for a colonoscopy

## 2020-07-24 ENCOUNTER — Encounter: Payer: Self-pay | Admitting: Internal Medicine

## 2020-07-24 ENCOUNTER — Ambulatory Visit (INDEPENDENT_AMBULATORY_CARE_PROVIDER_SITE_OTHER): Payer: No Typology Code available for payment source | Admitting: Internal Medicine

## 2020-07-24 ENCOUNTER — Other Ambulatory Visit: Payer: Self-pay | Admitting: Internal Medicine

## 2020-07-24 ENCOUNTER — Other Ambulatory Visit: Payer: Self-pay

## 2020-07-24 VITALS — BP 128/80 | HR 65 | Ht 70.0 in | Wt 292.5 lb

## 2020-07-24 DIAGNOSIS — E119 Type 2 diabetes mellitus without complications: Secondary | ICD-10-CM

## 2020-07-24 DIAGNOSIS — E890 Postprocedural hypothyroidism: Secondary | ICD-10-CM

## 2020-07-24 DIAGNOSIS — Z794 Long term (current) use of insulin: Secondary | ICD-10-CM

## 2020-07-24 LAB — POCT GLUCOSE (DEVICE FOR HOME USE): POC Glucose: 278 mg/dl — AB (ref 70–99)

## 2020-07-24 MED ORDER — LEVOTHYROXINE SODIUM 137 MCG PO CAPS: 137.0000 ug | ORAL_CAPSULE | Freq: Every day | ORAL | 3 refills | Status: DC

## 2020-07-24 NOTE — Patient Instructions (Addendum)
Start  lantus 40  units daily  Continue Novolog  22 units with each meal  Continue Actos  30 mg  1 tablet daily      HOW TO TREAT LOW BLOOD SUGARS (Blood sugar LESS THAN 70 MG/DL)  Please follow the RULE OF 15 for the treatment of hypoglycemia treatment (when your (blood sugars are less than 70 mg/dL)    STEP 1: Take 15 grams of carbohydrates when your blood sugar is low, which includes:   3-4 GLUCOSE TABS  OR  3-4 OZ OF JUICE OR REGULAR SODA OR  ONE TUBE OF GLUCOSE GEL     STEP 2: RECHECK blood sugar in 15 MINUTES STEP 3: If your blood sugar is still low at the 15 minute recheck --> then, go back to STEP 1 and treat AGAIN with another 15 grams of carbohydrates.;

## 2020-07-24 NOTE — Progress Notes (Signed)
Name: Lynn Martinez  Age/ Sex: 50 y.o., female   MRN/ DOB: [id number], 08/14/1970     PCP: Elsie Stain, MD   Reason for Endocrinology Evaluation: Type 2 Diabetes Mellitus  Initial Endocrine Consultative Visit: 12/20    PATIENT IDENTIFIER: [name] is a 50 y.o. female with a past medical history of T2DM, hypothyroidism, bipolar disorder and HTN . The patient has followed with Endocrinology clinic since 06/01/18 for consultative assistance with management of her diabetes.  DIABETIC HISTORY:  [name] was diagnosed with T2DM ~ 2019, and started insulin therapy shortly after the diagnosis. She has tried Glipizide and Januvia in the past.Intolerant to Metformin .  Her hemoglobin A1c has ranged from 9.1%  in 2017, peaking at 11.1% in 2019.    Thyroid History : She was diagnosed with Graves' disease in 2004. She was initially treated with thionamides. In 07/2003, she was offered treatment with RAI therapy vs surgical options, pt elected to be treated surgically . She underwent total thyroidectomy in 08/15/2003. 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 She was started on LT-4 replacement after surgery and has been compliant with her regimen.    SUBJECTIVE:   During the last visit (11/04/2019): A1c was 5.9 % we continued lantus, novolog and pioglitazone.      Today (07/24/2020): [name] is here for a  follow up on her diabetes management . She has been checking glucose 2 x a day. Has been getting hypoglycemic episodes daily, mainly occurring in the fasting status, she has not taking it regularly to reduce the amount of hypoglycemia.  Last dose of lantus Thursday 5/20 th    She got married to [place] (Gene) on 06/14/2019   Has not been taking insulin since 03/2020     HOME DIABETES REGIMEN:  Lantus 38 units  Novolog 22 units QAC  Actos 30 mg daily     GLUCOSE LOG:Did not  bring        DIABETIC COMPLICATIONS: Microvascular complications:    Denies: neuropathy, nephropathy, retinopathy  Last eye exam: Completed 06/2019  Macrovascular complications:    Denies: CAD, PVD, CVA   HISTORY:  Past Medical History:  Past Medical History:  Diagnosis Date  . Anemia   . Anxiety   . Arthritis    "knees" (09/30/2015)  . Bipolar disorder ([place])   . Depression   . Diabetes mellitus without complication ([place])   . Fibroid    s/p myomectomy 2010, hysterectomy 2013  . Grave's disease   . Heart murmur   . Hormone disorder   . Hypertension   . Hypothyroidism   . 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 Migraine     otc med prn  . PCOS (polycystic ovarian syndrome)   . PMS (premenstrual syndrome)   . PTSD (post-traumatic stress disorder)   . Seasonal allergies   . Thyroid cancer ([place]) 2005  . Thyroid disease   . Type II diabetes mellitus ([place])   . Vertigo    Past Surgical History:  Past Surgical History:  Procedure Laterality Date  . ABDOMINAL HYSTERECTOMY  10/11/2011   Procedure: HYSTERECTOMY ABDOMINAL;  Surgeon: Terrance Mass, MD;  Location: [facility];  Service: Gynecology;  Laterality: N/A;  With Repair of serosa.  . ABDOMINAL HYSTERECTOMY    . CHOLECYSTECTOMY N/A 09/06/2013   Procedure: LAPAROSCOPIC CHOLECYSTECTOMY WITH INTRAOPERATIVE CHOLANGIOGRAM;  Surgeon: Luella Cook III,  MD;  Location: [facility];  Service: General;  Laterality: N/A;  . DIAGNOSTIC LAPAROSCOPY    . DILATION AND CURETTAGE OF UTERUS    . HERNIA REPAIR    . MYOMECTOMY  2010   LAPAROSCOPY  . thyroid removed    . TOTAL THYROIDECTOMY  2006  . UMBILICAL HERNIA REPAIR  1982    Social History:  reports that she has never smoked. She has never used smokeless tobacco. She reports that she does not drink alcohol and does not use drugs. Family History:  Family History  Problem Relation Age of Onset  . Thyroid disease Mother   . Diabetes Father   .  Hypertension Father   . Heart disease Father   . Heart attack Father   . Cancer Maternal Aunt        BRAIN  . Cancer Maternal Grandmother        LYMPHOMA  . Hypertension Maternal Grandmother   . Cancer Paternal Grandmother        PANCREATIC  . Diabetes Paternal Grandmother   . Hypertension Paternal Grandmother   . Hypertension Maternal Grandfather   . Diabetes Maternal Grandfather   . Hypertension Paternal Grandfather   . Pancreatic cancer Paternal Grandfather      HOME MEDICATIONS: Allergies as of 07/24/2020      Reactions   Bee Venom    Codeine Itching   Tolerates Hydrocodone OK.   Ibuprofen Hives, Other (See Comments)   Happened in childhood   Ibuprofen Hives, Swelling   Metformin And Related Other (See Comments)   Muscle weakness   Morphine Itching, Other (See Comments)   Throat swelling   Penicillins    Childhood Allergy Has patient had a PCN reaction causing immediate rash, facial/tongue/throat swelling, SOB or lightheadedness with hypotension: Unknown Has patient had a PCN reaction causing severe rash involving mucus membranes or skin necrosis: Unknown Has patient had a PCN reaction that required hospitalization: Unknown Has patient had a PCN reaction occurring within the last 10 years: Unknown If all of the above answers are "NO", then may proceed with Cephalosporin use.   Aspirin Hives, Swelling, Rash      Medication List       Accurate as of July 24, 2020 12:39 PM. If you have any questions, ask your nurse or doctor.        atorvastatin 10 MG tablet Commonly known as: LIPITOR Take 1 tablet (10 mg total) by mouth daily. To lower cholesterol   citalopram 20 MG tablet Commonly known as: CeleXA Take 1 tablet (20 mg total) by mouth daily.   diclofenac Sodium 1 % Gel Commonly known as: VOLTAREN Apply topically.   gabapentin 300 MG capsule Commonly known as: Neurontin Take 2 capsules (600 mg total) by mouth daily.   hydrOXYzine 25 MG  tablet Commonly known as: ATARAX/VISTARIL Take 1 tablet (25 mg total) by mouth 2 (two) times daily.   Lantus SoloStar 100 UNIT/ML Solostar Pen Generic drug: insulin glargine Inject 50 Units into the skin daily.   levothyroxine 125 MCG tablet Commonly known as: SYNTHROID Take 1 tablet (125 mcg total) by mouth daily before breakfast.   lithium carbonate 450 MG CR tablet Commonly known as: ESKALITH Take 2 capsules at bedtime   lurasidone 80 MG Tabs tablet Commonly known as: LATUDA Take 1 tablet (80 mg total) by mouth at bedtime.   methocarbamol 500 MG tablet Commonly known as: Robaxin Take 1 tablet (500 mg total) by mouth 3 (three) times daily as needed  for muscle spasms.   NovoLOG FlexPen 100 UNIT/ML FlexPen Generic drug: insulin aspart 22 units three times daily with meals   omeprazole 40 MG capsule Commonly known as: PRILOSEC Take 1 capsule (40 mg total) by mouth 2 (two) times daily.   pioglitazone 30 MG tablet Commonly known as: ACTOS Take 1 tablet (30 mg total) by mouth daily.   prazosin 1 MG capsule Commonly known as: MINIPRESS Take 1 capsule (1 mg total) by mouth at bedtime. For sleep/nightmares   propranolol 40 MG tablet Commonly known as: INDERAL Take 1 tablet (40 mg total) by mouth 2 (two) times daily.   tolterodine 4 MG 24 hr capsule Commonly known as: Detrol LA Take 1 capsule (4 mg total) by mouth daily.   TRUEplus Pen Needles 32G X 4 MM Misc Generic drug: Insulin Pen Needle Use as instructed to inject insulin.   Vitamin D (Ergocalciferol) 1.25 MG (50000 UNIT) Caps capsule Commonly known as: DRISDOL Take 1 capsule (50,000 Units total) by mouth every 7 (seven) days.        OBJECTIVE:   Vital Signs: BP 128/80   Pulse 65   Ht 5\' 10"  (1.778 m)   Wt 292 lb 8 oz (132.7 kg)   LMP 09/20/2011   SpO2 97%   BMI 41.97 kg/m   Wt Readings from Last 3 Encounters:  07/09/20 296 lb (134.3 kg)  03/18/20 (!) 304 lb 12.8 oz (138.3 kg)  11/04/19 (!) 317  lb 3.2 oz (143.9 kg)     Exam: General: Pt appears well and is in NAD  Lungs: Clear with good BS bilat with no rales, rhonchi, or wheezes  Heart: RRR with normal S1 and S2 and no gallops; no murmurs; no rub  Extremities: No pretibial edema.   Neuro: MS is good with appropriate affect, pt is alert and Ox3   DM foot exam:07/05/2019 The skin of the feet is intact without sores or ulcerations. The pedal pulses are 2+ on right and 2+ on left. The sensation is intact to a screening 5.07, 10 gram monofilament bilaterally    DATA REVIEWED:     Lab Results  Component Value Date   LDLCALC 93 07/09/2020   CREATININE 0.76 07/09/2020   Results for JAMMI, MORRISSETTE (MRN [id number]) as of 07/27/2020 08:06  Ref. Range 07/09/2020 09:51  TSH Latest Ref Range: 0.450 - 4.500 uIU/mL 8.110 (H)    ASSESSMENT / PLAN / RECOMMENDATIONS:   1) Type 2 Diabetes Mellitus, Poorly Controlled, Without complications - Most recent A1c of 13.7 %. Goal A1c < 7.0 %.     - Poorly controlled diabetes due to dietary indiscretions and medication non-adherence.  - She is interested in the [place]. We discussed that she still has to take care of it, otherwise she will continue with hyperglycemia  - A referral to our CDE has been placed  - Will restart insulin as below     MEDICATIONS:   Restart Lantus 40  units   Continue Novolog  22 units TIDQAC  Continue Pioglitazone 30 mg daily   EDUCATION / INSTRUCTIONS:  BG monitoring instructions: Patient is instructed to check her blood sugars 4 times a day,before meals and bedtime.  Call [facility] if: BG persistently < 70 or > 300. . I reviewed the Rule of 15 for the treatment of hypoglycemia in detail with the patient. Literature supplied.   2) Diabetic complications:   Eye: Does not have known diabetic retinopathy.   Neuro/ Feet: Does not have  known diabetic peripheral neuropathy.  Renal: Patient does not have known baseline  CKD. She is not on an ACEI/ARB at present.    3) Post-Surgical Hypothyroidism :   She is clinically and biochemically euthyroid  Continue Levothyroxine 125 mcg daily  Path report shows no evidence of atypia or malignancy as per records from "care everywhere", these results were conveyed to the patient, as she was under the impression she had possible cancer in her thyroid.   Recent TSh elevated at 8 uIU/Ml, will increase Levothyroxine as below     Medication Stop Levothyroxine 125 mcg daily  Increase Levothyroxine 137 mcg daily     F/U in 3 months     Signed electronically by: Mack Guise, MD  [facility]  [facility] [address] [place], [place] [address] Phone: [phone] FAX: [phone]   CC: Elsie Stain, MD [address] [place] [address] Phone: [phone]  Fax: [phone]  Return to Endocrinology clinic as below: Future Appointments  Date Time Provider [place]  07/24/2020  3:40 PM Daquan Crapps, Melanie Crazier, MD [facility] None  07/25/2020  8:15 AM Gar Ponto, PT [facility]  08/13/2020 10:00 AM [facility]IC [facility] None  08/13/2020 11:00 AM [facility] MOBILE MM 1 [facility] GI-BREAST CE  08/17/2020  9:15 AM Valora Piccolo Barnabas Lister, RD [place] NDM  08/20/2020  9:30 AM Elsie Stain, MD [facility] None  09/14/2020  1:00 PM [place] Crane, MD [facility] None

## 2020-07-25 ENCOUNTER — Ambulatory Visit: Payer: 59 | Attending: Critical Care Medicine

## 2020-07-25 DIAGNOSIS — G8929 Other chronic pain: Secondary | ICD-10-CM | POA: Insufficient documentation

## 2020-07-25 DIAGNOSIS — M545 Low back pain, unspecified: Secondary | ICD-10-CM | POA: Diagnosis not present

## 2020-07-25 DIAGNOSIS — R262 Difficulty in walking, not elsewhere classified: Secondary | ICD-10-CM | POA: Diagnosis present

## 2020-07-25 DIAGNOSIS — M5386 Other specified dorsopathies, lumbar region: Secondary | ICD-10-CM | POA: Insufficient documentation

## 2020-07-26 NOTE — Therapy (Signed)
[place], [place], [address] Phone: [phone]   Fax:  [phone]  Physical Therapy Evaluation  Patient Details  Name: Lynn Martinez MRN: [id number] Date of Birth: [birth year] Referring Provider (PT): Elsie Stain, MD   Encounter Date: 07/25/2020   PT End of Session - 07/26/20 2058    Visit Number 1    Number of Visits 7    Date for PT Re-Evaluation 09/12/20    Authorization Type Self pay    Authorization Time Period FOTO- 5th visit    PT Start Time 0815    PT Stop Time 0905    PT Time Calculation (min) 50 min    Activity Tolerance Patient tolerated treatment well    Behavior During Therapy [facility] for tasks assessed/performed           Past Medical History:  Diagnosis Date  . Anemia   . Anxiety   . Arthritis    "knees" (09/30/2015)  . Bipolar disorder ([place])   . Depression   . Diabetes mellitus without complication ([place])   . Fibroid    s/p myomectomy 2010, hysterectomy 2013  . Grave's disease   . Heart murmur   . Hormone disorder   . Hypertension   . Hypothyroidism   . 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 Migraine     otc med prn  . PCOS (polycystic ovarian syndrome)   . PMS (premenstrual syndrome)   . PTSD (post-traumatic stress disorder)   . Seasonal allergies   . Thyroid cancer ([place]) 2005  . Thyroid disease   . Type II diabetes mellitus ([place])   . Vertigo     Past Surgical History:  Procedure Laterality Date  . ABDOMINAL HYSTERECTOMY  10/11/2011   Procedure: HYSTERECTOMY ABDOMINAL;  Surgeon: Terrance Mass, MD;  Location: [facility];  Service: Gynecology;  Laterality: N/A;  With Repair of serosa.  . ABDOMINAL HYSTERECTOMY    . CHOLECYSTECTOMY N/A 09/06/2013   Procedure: LAPAROSCOPIC CHOLECYSTECTOMY WITH INTRAOPERATIVE CHOLANGIOGRAM;  Surgeon: Merrie Roof, MD;  Location: [facility];  Service: General;  Laterality: N/A;  . DIAGNOSTIC  LAPAROSCOPY    . DILATION AND CURETTAGE OF UTERUS    . HERNIA REPAIR    . MYOMECTOMY  2010   LAPAROSCOPY  . thyroid removed    . TOTAL THYROIDECTOMY  2006  . UMBILICAL HERNIA REPAIR  1982    There were no vitals filed for this visit.    Subjective Assessment - 07/26/20 2232    Subjective Pt reports her low back pain is currently low at a 5/10. It feels like I need a brace on my back for prolonged walks. The pain has developed over time. Pt reports a 5 falls in the past 6 months. Majority of falls appear to be due to decreased awareness of enviroment.    Limitations Sitting;Walking;Standing;House hold activities    Patient Stated Goals For my back pain to get better and to have better balance.    Currently in Pain? Yes    Pain Score 5     Pain Location Back    Pain Orientation Posterior;Lower    Pain Descriptors / Indicators Throbbing;Sharp    Pain Type Chronic pain    Pain Onset More than a month ago    Pain Frequency Constant    Aggravating Factors  Prolonged activity: walking, standing, sitting    Pain Relieving Factors Lying down or  reclined    Multiple Pain Sites No              [facility] PT Assessment - 07/26/20 0001      Assessment   Medical Diagnosis Chronic midline low back pain without sciatica    Referring Provider (PT) Elsie Stain, MD    Onset Date/Surgical Date --   developed over time   Hand Dominance Right    Prior Therapy No      Precautions   Precautions None      Restrictions   Weight Bearing Restrictions No      Balance Screen   Has the patient fallen in the past 6 months Yes    How many times? 5    Has the patient had a decrease in activity level because of a fear of falling?  No    Is the patient reluctant to leave their home because of a fear of falling?  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      Cognition   Overall Cognitive Status Within Functional Limits for tasks assessed      Observation/Other Assessments   Focus on Therapeutic Outcomes (FOTO)  40% functional ability   SPARE 68     Sensation   Light Touch Appears Intact      Deep Tendon Reflexes   DTR Assessment Site Patella;Achilles    Patella DTR 2+    Achilles DTR 2+      ROM / Strength   AROM / PROM / Strength AROM;Strength      AROM   AROM Assessment Site Lumbar    Lumbar Flexion full, felt good, lordosis does not flattern    Lumbar Extension 25% limited, pain endrange    Lumbar - Right Side Bend Full    Lumbar - Left Side Bend 25% limted, pain endrange    Lumbar - Right Rotation Full    Lumbar - Left Rotation Full, pain endrange      Strength   Overall Strength Comments myotomal screen negative      Transfers   Transfers Sit to Stand;Stand to Sit    Sit to Stand 7: Independent      Ambulation/Gait   Ambulation/Gait Yes    Ambulation/Gait Assistance 7: Independent    Gait Pattern Step-to pattern                      Objective measurements completed on examination: See above findings.               PT Education - 07/26/20 2051    Education Details Eval findings, POC, HEP, recommendation to not stomach sleep and to try sidelying or on her back will pillows for support    Person(s) Educated Patient    Methods Explanation;Demonstration;Tactile cues;Verbal cues;Handout    Comprehension Verbalized understanding;Returned demonstration;Verbal cues required;Tactile cues required;Need further instruction            PT Short Term Goals - 07/26/20 2159      PT SHORT TERM GOAL #1   Title Pt will be  Ind in an initial HEP    Baseline started on eval    Status New     Target Date 08/16/20      PT SHORT TERM GOAL #2   Title Pt will voice understanding of measures to assist with the reduction of low back pain             PT Long Term Goals - 07/26/20 2204      PT LONG TERM GOAL #1   Title Pt's lumbar ext and L SB will improved to full ROM    Baseline 25% limited for both movements    Status New    Target Date 09/13/20      PT LONG TERM GOAL #2   Title Pt will report a decrease in her low back to 6/10 or less with daily activities to improve the function of her low back    Baseline 5-10/10    Status New    Target Date 09/12/20      PT LONG TERM GOAL #3   Title Pt will demonstrate proper body mechanics for household activities to assist in the reduction of low back strain and pain    Status New    Target Date 09/13/20      PT LONG TERM GOAL #4   Title Pt will be Ind in a final HEP to maintain or progress achieved LOF    Status New    Target Date 09/12/20                  Plan - 07/26/20 2133    Clinical Impression Statement Pt presents with a chronic hx of low back pain. Pt's pain was provoked with exension and L SB. Pt's lumbar lordosis did not reverse with FB. Pt's low back pain responded well to flexion ther ex. Pt will benefit from PT 1w6 for lumbopelvic flexibility with flexion bias, lumboplevic strengthening, back care education, and the use of modalities and manual techniques to decrease lowback pain and to optimize function.    Personal Factors and Comorbidities Time since onset of injury/illness/exacerbation;Comorbidity 1    Comorbidities Obese    Examination-Activity Limitations Stand;Locomotion Level    Stability/Clinical Decision Making Stable/Uncomplicated    Clinical Decision Making Low    Rehab Potential Good    PT Frequency 1x / week    PT Duration 6 weeks    PT Treatment/Interventions ADLs/Self Care Home Management;Cryotherapy;Electrical Stimulation;Ultrasound;Traction;Moist Heat;Iontophoresis 4mg /ml  Dexamethasone;Functional mobility training;Therapeutic activities;Therapeutic exercise;Balance training;Patient/family education;Manual techniques;Dry needling;Passive range of motion;Taping    PT Next Visit Plan Review response to HEP and education. Review FOTO    PT Home Exercise Plan [id number]    Consulted and Agree with Plan of Care Patient           Patient will benefit from skilled therapeutic intervention in order to improve the following deficits and impairments:  Difficulty walking,Decreased range of motion,Obesity,Decreased activity tolerance,Pain,Decreased balance,Decreased strength,Postural dysfunction  Visit Diagnosis: Chronic midline low back pain without sciatica  Decreased ROM of lumbar spine  Difficulty in walking, not elsewhere classified     Problem List Patient Active Problem List   Diagnosis Date Noted  . Heme positive stool 07/15/2020  . Morbid obesity with BMI of 40.0-44.9, adult ([place]) 07/09/2020  . Bipolar 1 disorder, depressed, moderate ([place]) 02/10/2020  . Breast pain, left 06/11/2019  . Radiculopathy, cervical region 11/21/2018  . Carpal tunnel syndrome, right upper limb 10/08/2018  . Carpal tunnel syndrome, left upper limb 10/08/2018  . Hypertension  09/30/2015  . Type 2 diabetes mellitus with hyperglycemia, with long-term current use of insulin ([place]) 09/30/2015  . S/P cholecystectomy 09/05/2013  . Borderline behavior 03/14/2012    Class: Chronic  . OCD (obsessive compulsive disorder) 03/14/2012    Class: Chronic  . PTSD (post-traumatic stress disorder) 03/14/2012    Class: Chronic  . 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 Lumbago without sciatica 03/12/2012  . Major depression, recurrent ([place]) 03/08/2012    Class: Chronic  . Snoring 12/02/2011  . PCOS (polycystic ovarian syndrome) 09/05/2011  . Hypothyroidism 08/13/2010  . Vitamin D deficiency 08/13/2010  . ANEMIA-NOS 08/13/2010    Gar Ponto MS,  PT 07/26/20 10:34 PM  [facility] [address] [place], [place], [address] Phone: [phone]   Fax:  [phone]  Name: Lynn Martinez MRN: [id number] Date of Birth: Nov 11, 1970

## 2020-08-05 ENCOUNTER — Encounter: Payer: 59 | Attending: Internal Medicine | Admitting: Nutrition

## 2020-08-05 ENCOUNTER — Other Ambulatory Visit: Payer: Self-pay

## 2020-08-05 DIAGNOSIS — Z794 Long term (current) use of insulin: Secondary | ICD-10-CM | POA: Diagnosis present

## 2020-08-05 DIAGNOSIS — E1165 Type 2 diabetes mellitus with hyperglycemia: Secondary | ICD-10-CM | POA: Diagnosis not present

## 2020-08-05 NOTE — Progress Notes (Signed)
Patient is here today to discuss the OmniPod Dash pump.  She has already gone online and watched videos, and got an estimate for costs for this.  We discussed what is needed to be on a pump-the ability to count carbs, test blood sugars QID, and respond to the alerts and alarms quickly.  She has agreed to do this.     She reports that she knows how to count grams of carbohydrate and feels that this will help her with her control.  She reports taking her insulin each day, and testing blood sugars.    She was shown the Dexcom CGM system, but did not bring her phone to start on this.   She had no final questions, and was told to call me when she gets both her pods and PDM.  Telephone number given to set up an appointment when she receives all of her supplies. Paperwork for pump swith insurance information filled out and placed on Dr. Quin Hoop desk to sign/fax to [place] at [employer]

## 2020-08-06 ENCOUNTER — Other Ambulatory Visit: Payer: Self-pay | Admitting: Critical Care Medicine

## 2020-08-06 ENCOUNTER — Telehealth: Payer: Self-pay

## 2020-08-06 ENCOUNTER — Ambulatory Visit: Payer: 59 | Admitting: Critical Care Medicine

## 2020-08-06 VITALS — BP 150/80 | HR 67 | Temp 98.2°F | Resp 18 | Ht 70.0 in | Wt 298.0 lb

## 2020-08-06 DIAGNOSIS — I10 Essential (primary) hypertension: Secondary | ICD-10-CM

## 2020-08-06 DIAGNOSIS — F3132 Bipolar disorder, current episode depressed, moderate: Secondary | ICD-10-CM

## 2020-08-06 DIAGNOSIS — Z6841 Body Mass Index (BMI) 40.0 and over, adult: Secondary | ICD-10-CM

## 2020-08-06 DIAGNOSIS — L750 Bromhidrosis: Secondary | ICD-10-CM

## 2020-08-06 DIAGNOSIS — E890 Postprocedural hypothyroidism: Secondary | ICD-10-CM

## 2020-08-06 DIAGNOSIS — Z794 Long term (current) use of insulin: Secondary | ICD-10-CM

## 2020-08-06 DIAGNOSIS — E1165 Type 2 diabetes mellitus with hyperglycemia: Secondary | ICD-10-CM

## 2020-08-06 MED ORDER — SIMETHICONE 125 MG PO CAPS: ORAL_CAPSULE | ORAL | 0 refills | Status: DC

## 2020-08-06 MED ORDER — CLINDAMYCIN PHOS-BENZOYL PEROX 1-5 % EX GEL: Freq: Two times a day (BID) | CUTANEOUS | 0 refills | Status: DC

## 2020-08-06 MED ORDER — VITAMIN D (ERGOCALCIFEROL) 1.25 MG (50000 UNIT) PO CAPS: 50000.0000 [IU] | ORAL_CAPSULE | ORAL | 1 refills | Status: DC

## 2020-08-06 NOTE — Telephone Encounter (Signed)
All right. Thanks for letting me know.

## 2020-08-06 NOTE — Progress Notes (Signed)
Patient reports dealing with Body odor since she was a young child. Patient shares this concern is affecting her work place due to co workers reporting her for the body odor. Patient would like to discuss a plan for this concern with PCP.

## 2020-08-06 NOTE — Patient Instructions (Signed)
Use simethicone or GasX 125mg  pill twice daily  This is over the counter Refill on Vitamin D given  Start Benzaclin gel to groin areas and under arms twice a day  Use an antibacterial soap: Dove or Dial soap is good.  Look in bath section of any pharmacy  Try anti wicking cotton clothing not tight fitting.  Use a thicker depends pad.  Let the Stomach doctor know about your bowel leakage when you go for your colonoscopy  Follow up with your endocrine MD on your diabetes Stay on new dose of thyroid pill, this will help  Use a good antiperspirant under arms and on groin area  A powder is good choice  We can send you to dermatology if this continues to be a problem

## 2020-08-06 NOTE — Assessment & Plan Note (Signed)
As per endocrinology no changes

## 2020-08-06 NOTE — Assessment & Plan Note (Signed)
Continue current medications. 

## 2020-08-06 NOTE — Assessment & Plan Note (Signed)
No change in blood pressure medication for now
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
## 2020-08-06 NOTE — Assessment & Plan Note (Signed)
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

## 2020-08-06 NOTE — Telephone Encounter (Signed)
We have been requesting a copy of patient's 2020 tax return since 03/26/2020.  As recent as 07/03/2020, patient has stated that she 'would find and return'.  Refilled Latuda today for $10 and called patient to tell her that for further refills the tax return must be presented for patient assistance enrollment.  She has a 41-month supply of meds to hopefully get her through until she can return with the tax return that we need to complete the enrollment process.  Patient stated she would have to contact the IRS to get a copy from them.

## 2020-08-06 NOTE — Assessment & Plan Note (Signed)
Markedly improved from prior visit continue current medication protocol

## 2020-08-08 ENCOUNTER — Encounter: Payer: Self-pay | Admitting: Rehabilitative and Restorative Service Providers"

## 2020-08-08 ENCOUNTER — Ambulatory Visit: Payer: 59 | Admitting: Rehabilitative and Restorative Service Providers"

## 2020-08-08 ENCOUNTER — Other Ambulatory Visit: Payer: Self-pay

## 2020-08-08 DIAGNOSIS — M545 Low back pain, unspecified: Secondary | ICD-10-CM | POA: Diagnosis not present

## 2020-08-08 DIAGNOSIS — G8929 Other chronic pain: Secondary | ICD-10-CM

## 2020-08-08 DIAGNOSIS — M5386 Other specified dorsopathies, lumbar region: Secondary | ICD-10-CM

## 2020-08-08 DIAGNOSIS — R262 Difficulty in walking, not elsewhere classified: Secondary | ICD-10-CM

## 2020-08-08 NOTE — Therapy (Signed)
[facility] [place], [place], [address] Phone: [phone]   Fax:  [phone]  Physical Therapy Treatment  Patient Details  Name: Lynn Martinez MRN: [id number] Date of Birth: 11/09/1970 Referring Provider (PT): Elsie Stain, MD   Encounter Date: 08/08/2020   PT End of Session - 08/08/20 0852    Visit Number 2    PT Start Time 2778    PT Stop Time 0903    PT Time Calculation (min) 30 min    Activity Tolerance Patient tolerated treatment well;No increased pain    Behavior During Therapy WFL for tasks assessed/performed           Past Medical History:  Diagnosis Date  . Anemia   . Anxiety   . Arthritis    "knees" (09/30/2015)  . Bipolar disorder ([place])   . Depression   . Diabetes mellitus without complication ([place])   . Fibroid    s/p myomectomy 2010, hysterectomy 2013  . Grave's disease   . Heart murmur   . Hormone disorder   . Hypertension   . Hypothyroidism   . 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 Migraine     otc med prn  . PCOS (polycystic ovarian syndrome)   . PMS (premenstrual syndrome)   . PTSD (post-traumatic stress disorder)   . Seasonal allergies   . Thyroid cancer ([place]) 2005  . Thyroid disease   . Type II diabetes mellitus ([place])   . Vertigo     Past Surgical History:  Procedure Laterality Date  . ABDOMINAL HYSTERECTOMY  10/11/2011   Procedure: HYSTERECTOMY ABDOMINAL;  Surgeon: Terrance Mass, MD;  Location: [facility];  Service: Gynecology;  Laterality: N/A;  With Repair of serosa.  . ABDOMINAL HYSTERECTOMY    . CHOLECYSTECTOMY N/A 09/06/2013   Procedure: LAPAROSCOPIC CHOLECYSTECTOMY WITH INTRAOPERATIVE CHOLANGIOGRAM;  Surgeon: Merrie Roof, MD;  Location: [facility];  Service: General;  Laterality: N/A;  . DIAGNOSTIC LAPAROSCOPY    . DILATION AND CURETTAGE OF UTERUS    . HERNIA REPAIR    . MYOMECTOMY  2010   LAPAROSCOPY  . thyroid removed     . TOTAL THYROIDECTOMY  2006  . UMBILICAL HERNIA REPAIR  1982    There were no vitals filed for this visit.   Subjective Assessment - 08/08/20 0835    Subjective Laying down is worse; sitting up is slightly better; it really doesn't matter the position. My back is horrible. Pt arrived at 833 for 815 appt.    Currently in Pain? Yes    Pain Score 6     Pain Location Back    Pain Orientation Left    Pain Descriptors / Indicators Tightness    Pain Type Chronic pain    Multiple Pain Sites No                             [facility] Adult PT Treatment/Exercise - 08/08/20 0001      Exercises   Exercises Lumbar      Lumbar Exercises: Seated   Other Seated Lumbar Exercises review trunk flexion stretch and seated hamstring stretch      Lumbar Exercises: Supine   Other Supine Lumbar Exercises knee to chest stretch review; tilt review; tilt with march x 15; tilt with SLR x 10; tilt with clam shell x 20; tilt with oblique reach x 20  PT Education - 08/08/20 1136    Education Details Educated pt on tilt with all transitional movements with PT demonstration and demonstration on how to maintain neutral spine posture to pick up items along with golfers lift. Pt was able to return demonstrate proper mechanics to get items from chair. Discussed lying in sidelying with pillow between knees    Person(s) Educated Patient    Methods Explanation;Demonstration    Comprehension Verbalized understanding;Returned demonstration            PT Short Term Goals - 08/08/20 1139      PT SHORT TERM GOAL #1   Title Pt will be Ind in an initial HEP    Status On-going      PT SHORT TERM GOAL #2   Title Pt will voice understanding of measures to assist with the reduction of low back pain    Status On-going             PT Long Term Goals - 07/26/20 2204      PT LONG TERM GOAL #1   Title Pt's lumbar ext and L SB will improved to full ROM    Baseline 25% limited  for both movements    Status New    Target Date 09/13/20      PT LONG TERM GOAL #2   Title Pt will report a decrease in her low back to 6/10 or less with daily activities to improve the function of her low back    Baseline 5-10/10    Status New    Target Date 09/12/20      PT LONG TERM GOAL #3   Title Pt will demonstrate proper body mechanics for household activities to assist in the reduction of low back strain and pain    Status New    Target Date 09/13/20      PT LONG TERM GOAL #4   Title Pt will be Ind in a final HEP to maintain or progress achieved LOF    Status New    Target Date 09/12/20                 Plan - 08/08/20 0855    Clinical Impression Statement Pt presented to PT late for treatment. She demonstrates decreased core and lumbar stability. She is compliant with her exercises and remembered her HEP but needed cues on slowing down while performing and using a towel under her left knee for knee to chest stretch. She does not have a preference after discussion for flexion/extension bias and demonstrating to PT her activities that improve her pain (slouched sititng and prone position). Pt would benefit from PT for further lumbar/core strengthening/flexibility and pain relief activities to improve her pain.    Rehab Potential Good    PT Frequency 1x / week    PT Duration 6 weeks    PT Treatment/Interventions ADLs/Self Care Home Management;Cryotherapy;Electrical Stimulation;Ultrasound;Traction;Moist Heat;Iontophoresis 4mg /ml Dexamethasone;Functional mobility training;Therapeutic activities;Therapeutic exercise;Balance training;Patient/family education;Manual techniques;Dry needling;Passive range of motion;Taping    PT Next Visit Plan Review [place]oto; progress lumbar/core strengthening; progress body mechanics training and HEP    Consulted and Agree with Plan of Care Patient           Patient will benefit from skilled therapeutic intervention in order to improve the  following deficits and impairments:  Difficulty walking,Decreased range of motion,Obesity,Decreased activity tolerance,Pain,Decreased balance,Decreased strength,Postural dysfunction  Visit Diagnosis: Chronic midline low back pain without sciatica  Decreased ROM of lumbar spine  Difficulty in walking, not  elsewhere classified     Problem List Patient Active Problem List   Diagnosis Date Noted  . 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 Heme positive stool 07/15/2020  . Morbid obesity with BMI of 40.0-44.9, adult ([place]) 07/09/2020  . Bipolar 1 disorder, depressed, moderate ([place]) 02/10/2020  . Breast pain, left 06/11/2019  . Radiculopathy, cervical region 11/21/2018  . Carpal tunnel syndrome, right upper limb 10/08/2018  . Carpal tunnel syndrome, left upper limb 10/08/2018  . Hypertension 09/30/2015  . Type 2 diabetes mellitus with hyperglycemia, with long-term current use of insulin ([place]) 09/30/2015  . S/P cholecystectomy 09/05/2013  . Borderline behavior 03/14/2012    Class: Chronic  . OCD (obsessive compulsive disorder) 03/14/2012    Class: Chronic  . PTSD (post-traumatic stress disorder) 03/14/2012    Class: Chronic  . 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 Lumbago without sciatica 03/12/2012  . Major depression, recurrent ([place]) 03/08/2012    Class: Chronic  . Snoring 12/02/2011  . PCOS (polycystic ovarian syndrome) 09/05/2011  . Hypothyroidism 08/13/2010  . Vitamin D deficiency 08/13/2010  . ANEMIA-NOS 08/13/2010    Ghali Morissette, PT 08/08/2020, 11:41 AM  [facility] [address] [place], [place], [address] Phone: [phone]   Fax:  [phone]  Name: Lynn Martinez MRN: [id number] Date of Birth: 07/13/1970

## 2020-08-12 ENCOUNTER — Other Ambulatory Visit: Payer: Self-pay | Admitting: Internal Medicine

## 2020-08-12 MED ORDER — OMNIPOD DASH PODS (GEN 4) MISC: 1.0000 | 3 refills | Status: DC

## 2020-08-12 NOTE — Addendum Note (Signed)
Addended by: Dorita Sciara on: 08/12/2020 10:07 AM   Modules accepted: Orders

## 2020-08-13 ENCOUNTER — Ambulatory Visit
Admission: RE | Admit: 2020-08-13 | Discharge: 2020-08-13 | Disposition: A | Discharge status: Home / Self Care | Payer: No Typology Code available for payment source | Source: Ambulatory Visit | Attending: Obstetrics and Gynecology | Admitting: Obstetrics and Gynecology

## 2020-08-13 ENCOUNTER — Other Ambulatory Visit: Payer: Self-pay

## 2020-08-13 ENCOUNTER — Ambulatory Visit: Payer: Self-pay | Admitting: *Deleted

## 2020-08-13 VITALS — BP 128/82 | Wt 303.5 lb

## 2020-08-13 DIAGNOSIS — Z1231 Encounter for screening mammogram for malignant neoplasm of breast: Secondary | ICD-10-CM

## 2020-08-13 DIAGNOSIS — Z1239 Encounter for other screening for malignant neoplasm of breast: Secondary | ICD-10-CM

## 2020-08-13 NOTE — Patient Instructions (Signed)
Explained breast self awareness with Lynn Martinez. Patient did not need a Pap smear today due to patient has a history of hysterectomy for benign reasons. Let patient know that she doesn't need any further Pap smears due to her history of a hysterectomy for benign reasons. Referred patient to the [facility] for a screening mammogram on the mobile unit. Appointment scheduled Thursday, August 13, 2020 at 1100. Patient escorted to the mobile unit following [facility] appointment for her screening mammogram. Let patient know the [facility] will follow up with her within the next couple weeks with results of her mammogram by letter or phone. Lynn Martinez verbalized understanding.  Diandra Cimini, Arvil [place], RN 10:02 AM

## 2020-08-13 NOTE — Progress Notes (Signed)
Lynn Martinez is a 50 y.o. female who presents to [facility] clinic today with no complaints.    Pap Smear: Pap smear not completed today. Last Pap smear was in 2012 and normal per patient. Per patient has no history of an abnormal Pap smear. Patient has a history of a hysterectomy 10/11/2011 due to fibroids. Patient no longer needs Pap smears due to her history of a hysterectomy for benign reasons per BCCCP and ASCCP guidelines. No Pap smear results are in EPIC.   Physical exam: Breasts Breasts symmetrical. No skin abnormalities bilateral breasts. No nipple retraction bilateral breasts. No nipple discharge bilateral breasts. No lymphadenopathy. No lumps palpated bilateral breasts. No complaints of pain or tenderness on exam.  MM DIAG BREAST TOMO BILATERAL  Result Date: 04/07/2016 CLINICAL DATA:  Patient describes palpable left breast lump for 1 month, with associated skin discoloration. Baseline mammogram. EXAM: 2D DIGITAL DIAGNOSTIC BILATERAL MAMMOGRAM WITH CAD AND ADJUNCT TOMO ULTRASOUND LEFT BREAST COMPARISON:  No prior exams. ACR Breast Density Category b: There are scattered areas of fibroglandular density. FINDINGS: Bilateral 2D CC and MLO projections were obtained today, with additional 3D tomosynthesis, and with additional spot compression view of the outer left breast corresponding to the area of clinical concern. There are no dominant masses, suspicious calcifications or secondary signs of malignancy within either breast. Specifically, there is no mammographic abnormality within the outer left breast corresponding to the area of clinical concern, perhaps mild focal skin thickening at the site of the skin marker. Mammographic images were processed with CAD. On physical exam, there is focal brownish skin discoloration within the outer left breast. Targeted ultrasound is performed, showing a sebaceous cyst with internal debris localized to the skin at the 2 o'clock axis, corresponding to  the site of clinical concern. No suspicious solid or cystic mass is identified within the underlying breast tissues. No underlying abscess. IMPRESSION: Benign sebaceous cyst, with internal debris, localized to the skin at the 2 o'clock axis of the left breast, corresponding to the area of clinical concern. No evidence of malignancy within either breast. RECOMMENDATION: 1. Patient was informed that sebaceous cysts are typically self-limiting. Patient was instructed to use warm compresses at the site and to keep the area clean to accelerate resolution. The patient was also instructed to return if the area that she feels becomes larger, firmer to palpation and/or increasingly painful. 2.  Screening mammogram in one year.(Code:SM-B-01Y) I have discussed the findings and recommendations with the patient. Results were also provided in writing at the conclusion of the visit. If applicable, a reminder letter will be sent to the patient regarding the next appointment. BI-RADS CATEGORY  2: Benign. Electronically Signed   By: Franki Cabot M.D.   On: 04/07/2016 15:18   MS DIGITAL DIAG TOMO BILAT  Addendum Date: 06/25/2019   ADDENDUM REPORT: 06/25/2019 08:53 ADDENDUM: BI-RADS 2: Benign. Electronically Signed   By: Ammie Ferrier M.D.   On: 06/25/2019 08:53   Result Date: 06/25/2019 CLINICAL DATA:  50 year old female presenting for evaluation of a palpable lump in the medial left breast for 6 months. EXAM: DIGITAL DIAGNOSTIC BILATERAL MAMMOGRAM WITH CAD AND TOMO ULTRASOUND LEFT BREAST COMPARISON:  Previous exam(s). ACR Breast Density Category b: There are scattered areas of fibroglandular density. FINDINGS: Spot compression tomosynthesis images reveal normal fibroglandular tissue in the medial aspect of the breast at the palpable site of concern. No suspicious masses or areas of shadowing are identified. There is an asymmetry in the lateral posterior left  breast which appears to resolve on spot compression tomosynthesis  images. No correlate is seen on the lateral views. No suspicious calcifications, masses or areas of distortion are seen in the bilateral breasts. Mammographic images were processed with CAD. On physical exam, no suspicious palpable lumps are identified in the medial aspect of the left breast. Targeted ultrasound is performed, showing normal fibroglandular tissue at the palpable site in the medial left breast. No suspicious masses or areas of shadowing are identified. Ultrasound at the site of the asymmetry in the lateral left breast demonstrates a few tiny cystic spaces with surrounding echogenicity, most likely representing benign fat necrosis. IMPRESSION: 1. No mammographic or targeted sonographic abnormalities are identified at the palpable site of concern in the medial left breast. 2. Minimal benign likely fat necrosis noted in the upper-outer quadrant of the left breast. 3.  No mammographic evidence of malignancy in the bilateral breasts. RECOMMENDATION: 1. Clinical follow-up recommended for the palpable area of concern in the medial left breast. Any further workup should be based on clinical grounds. 2.  Screening mammogram in one year.(Code:SM-B-01Y) I have discussed the findings and recommendations with the patient. If applicable, a reminder letter will be sent to the patient regarding the next appointment. BI-RADS CATEGORY  Screening mammogram in one year.(Code:SM-[id number]) Electronically Signed: By: Ammie Ferrier M.D. On: 06/24/2019 15:00         Pelvic/Bimanual Pap is not indicated today per BCCCP guidelines.    Smoking History: Patient has never smoked.   Patient Navigation: Patient education provided. Access to services provided for patient through BCCCP program.    Breast and Cervical Cancer Risk Assessment: Patient does not have family history of breast cancer, known genetic mutations, or radiation treatment to the chest before age 39. Patient does not have history of cervical dysplasia,  immunocompromised, or DES exposure in-utero.  Risk Assessment    Risk Scores      08/13/2020 06/11/2019   Last edited by: Royston Bake, CMA [place], Heath Gold, RN   5-year risk: 1 % 0.9 %   Lifetime risk: 9 % 9.2 %          A: BCCCP exam without pap smear No complaints.  P: Referred patient to the [facility] for a screening mammogram on the mobile unit. Appointment scheduled Thursday, August 13, 2020 at 1100.  Loletta Parish, RN 08/13/2020 10:02 AM

## 2020-08-15 ENCOUNTER — Ambulatory Visit: Payer: 59 | Attending: Critical Care Medicine

## 2020-08-15 ENCOUNTER — Other Ambulatory Visit: Payer: Self-pay

## 2020-08-15 DIAGNOSIS — M545 Low back pain, unspecified: Secondary | ICD-10-CM | POA: Diagnosis not present

## 2020-08-15 DIAGNOSIS — R262 Difficulty in walking, not elsewhere classified: Secondary | ICD-10-CM | POA: Insufficient documentation

## 2020-08-15 DIAGNOSIS — M5386 Other specified dorsopathies, lumbar region: Secondary | ICD-10-CM | POA: Diagnosis present

## 2020-08-15 DIAGNOSIS — G8929 Other chronic pain: Secondary | ICD-10-CM | POA: Diagnosis present

## 2020-08-16 NOTE — Therapy (Signed)
[facility], [place], [address] Phone: [phone]   Fax:  [phone]  Physical Therapy Treatment  Patient Details  Name: Lynn Martinez MRN: [id number] Date of Birth: 1970-08-17 Referring Provider (PT): Elsie Stain, MD   Encounter Date: 08/15/2020   PT End of Session - 08/16/20 1136    Visit Number 3    Number of Visits 7    Authorization Type Self pay    Authorization Time Period FOTO- 5th visit    PT Start Time 1036    PT Stop Time 1120    PT Time Calculation (min) 44 min    Activity Tolerance Patient tolerated treatment well;No increased pain    Behavior During Therapy WFL for tasks assessed/performed           Past Medical History:  Diagnosis Date  . Anemia   . Anxiety   . Arthritis    "knees" (09/30/2015)  . Bipolar disorder ([place])   . Depression   . Diabetes mellitus without complication ([place])   . Fibroid    s/p myomectomy 2010, hysterectomy 2013  . Grave's disease   . Heart murmur   . Hormone disorder   . Hypertension   . Hypothyroidism   . 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 Migraine     otc med prn  . PCOS (polycystic ovarian syndrome)   . PMS (premenstrual syndrome)   . PTSD (post-traumatic stress disorder)   . Seasonal allergies   . Thyroid cancer ([place]) 2005  . Thyroid disease   . Type II diabetes mellitus ([place])   . Vertigo     Past Surgical History:  Procedure Laterality Date  . ABDOMINAL HYSTERECTOMY  10/11/2011   Procedure: HYSTERECTOMY ABDOMINAL;  Surgeon: Terrance Mass, MD;  Location: [facility];  Service: Gynecology;  Laterality: N/A;  With Repair of serosa.  . ABDOMINAL HYSTERECTOMY    . CHOLECYSTECTOMY N/A 09/06/2013   Procedure: LAPAROSCOPIC CHOLECYSTECTOMY WITH INTRAOPERATIVE CHOLANGIOGRAM;  Surgeon: Merrie Roof, MD;  Location: [facility];  Service: General;  Laterality: N/A;  . DIAGNOSTIC LAPAROSCOPY    . DILATION  AND CURETTAGE OF UTERUS    . HERNIA REPAIR    . MYOMECTOMY  2010   LAPAROSCOPY  . thyroid removed    . TOTAL THYROIDECTOMY  2006  . UMBILICAL HERNIA REPAIR  1982    There were no vitals filed for this visit.   Subjective Assessment - 08/15/20 1045    Subjective Therapy is helping..    Patient Stated Goals For my back pain to get better and to have better balance.    Currently in Pain? Yes    Pain Score 8     Pain Location Back    Pain Orientation Left    Pain Descriptors / Indicators Tightness    Pain Type Chronic pain    Pain Onset More than a month ago    Pain Frequency Constant    Aggravating Factors  Prolonged activity: walking, standing, sitting    Pain Relieving Factors Lying down or reclined                             [facility] Adult PT Treatment/Exercise - 08/16/20 0001      Exercises   Exercises Lumbar      Lumbar Exercises: Stretches   Active Hamstring Stretch Right;Left;1 rep;20 seconds  Lower Trunk Rotation 5 reps;10 seconds    Other Lumbar Stretch Exercise Seatd forward trunk flexion, forward abd laterally, 10 sec each      Lumbar Exercises: Supine   Pelvic Tilt 10 reps   3 sec   Clam 15 reps;3 seconds    Clam Limitations Green Tband    Bent Knee Raise 10 reps    Bent Knee Raise Limitations marching    Other Supine Lumbar Exercises Hip add isometric, ball sqeeze, 10x, 5 sec                  PT Education - 08/16/20 1134    Education Details HEP- See instructions. Proper technique for turning in bed and for sit t/f sitting on EOB.    Person(s) Educated Patient    Methods Explanation;Demonstration    Comprehension Verbalized understanding;Returned demonstration;Verbal cues required;Tactile cues required;Need further instruction            PT Short Term Goals - 08/08/20 1139      PT SHORT TERM GOAL #1   Title Pt will be Ind in an initial HEP    Status On-going      PT SHORT TERM GOAL #2   Title Pt will voice  understanding of measures to assist with the reduction of low back pain    Status On-going             PT Long Term Goals - 07/26/20 2204      PT LONG TERM GOAL #1   Title Pt's lumbar ext and L SB will improved to full ROM    Baseline 25% limited for both movements    Status New    Target Date 09/13/20      PT LONG TERM GOAL #2   Title Pt will report a decrease in her low back to 6/10 or less with daily activities to improve the function of her low back    Baseline 5-10/10    Status New    Target Date 09/12/20      PT LONG TERM GOAL #3   Title Pt will demonstrate proper body mechanics for household activities to assist in the reduction of low back strain and pain    Status New    Target Date 09/13/20      PT LONG TERM GOAL #4   Title Pt will be Ind in a final HEP to maintain or progress achieved LOF    Status New    Target Date 09/12/20                 Plan - 08/15/20 1053    Clinical Impression Statement Reviewed FOTO. PT was completed for lumbopelvic flexibilty and strengthening  with a flexion bias for flexibility. Instructed in bed mobility for turning and sit t/f sitting on EOB for decreased twisting and a neutral spine. Pt returned demonstration. Pt reports consistent completion of HEP. Per pt's report she is responding to PT. Due length of Hx c low back pain, high BMI, and decreased fitness, anticipate a slower progress.    Personal Factors and Comorbidities Time since onset of injury/illness/exacerbation;Comorbidity 1    Comorbidities Obese    Examination-Activity Limitations Stand;Locomotion Level    Stability/Clinical Decision Making Stable/Uncomplicated    Clinical Decision Making Low    Rehab Potential Good    PT Frequency 1x / week    PT Duration 6 weeks    PT Treatment/Interventions ADLs/Self Care Home Management;Cryotherapy;Electrical Stimulation;Ultrasound;Traction;Moist Heat;Iontophoresis 4mg /ml Dexamethasone;Functional mobility training;Therapeutic  activities;Therapeutic exercise;Balance training;Patient/family education;Manual techniques;Dry needling;Passive range of motion;Taping    PT Next Visit Plan Progress lumbar/core strengthening; progress body mechanics training and HEP    PT Home Exercise Plan [id number]    Consulted and Agree with Plan of Care Patient           Patient will benefit from skilled therapeutic intervention in order to improve the following deficits and impairments:  Difficulty walking,Decreased range of motion,Obesity,Decreased activity tolerance,Pain,Decreased balance,Decreased strength,Postural dysfunction  Visit Diagnosis: Chronic midline low back pain without sciatica  Decreased ROM of lumbar spine  Difficulty in walking, not elsewhere classified     Problem List Patient Active Problem List   Diagnosis Date Noted  . 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 Heme positive stool 07/15/2020  . Morbid obesity with BMI of 40.0-44.9, adult ([place]) 07/09/2020  . Bipolar 1 disorder, depressed, moderate ([place]) 02/10/2020  . Breast pain, left 06/11/2019  . Radiculopathy, cervical region 11/21/2018  . Carpal tunnel syndrome, right upper limb 10/08/2018  . Carpal tunnel syndrome, left upper limb 10/08/2018  . Hypertension 09/30/2015  . Type 2 diabetes mellitus with hyperglycemia, with long-term current use of insulin ([place]) 09/30/2015  . S/P cholecystectomy 09/05/2013  . Borderline behavior 03/14/2012    Class: Chronic  . OCD (obsessive compulsive disorder) 03/14/2012    Class: Chronic  . PTSD (post-traumatic stress disorder) 03/14/2012    Class: Chronic  . 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 Lumbago without sciatica 03/12/2012  . Major depression, recurrent ([place]) 03/08/2012    Class: Chronic  . Snoring 12/02/2011  . PCOS (polycystic ovarian syndrome) 09/05/2011  . Hypothyroidism 08/13/2010  . Vitamin D deficiency 08/13/2010  . ANEMIA-NOS 08/13/2010    Gar Ponto MS,  PT 08/16/20 11:57 AM   [facility] [address] [place], [place], [address] Phone: [phone]   Fax:  [phone]  Name: Lynn Martinez MRN: [id number] Date of Birth: 1970/08/16

## 2020-08-17 ENCOUNTER — Other Ambulatory Visit: Payer: Self-pay | Admitting: Internal Medicine

## 2020-08-17 ENCOUNTER — Telehealth: Payer: Self-pay | Admitting: Dietician

## 2020-08-17 ENCOUNTER — Encounter: Payer: 59 | Attending: Internal Medicine | Admitting: Dietician

## 2020-08-17 ENCOUNTER — Other Ambulatory Visit: Payer: Self-pay

## 2020-08-17 DIAGNOSIS — Z794 Long term (current) use of insulin: Secondary | ICD-10-CM | POA: Diagnosis present

## 2020-08-17 DIAGNOSIS — E1165 Type 2 diabetes mellitus with hyperglycemia: Secondary | ICD-10-CM | POA: Diagnosis not present

## 2020-08-17 MED ORDER — DEXCOM G6 SENSOR MISC: 1.0000 | 3 refills | Status: DC

## 2020-08-17 MED ORDER — DEXCOM G6 TRANSMITTER MISC: 1.0000 | 3 refills | Status: DC

## 2020-08-17 MED ORDER — INSULIN ASPART 100 UNIT/ML ~~LOC~~ SOLN: SUBCUTANEOUS | 3 refills | Status: DC

## 2020-08-17 NOTE — Progress Notes (Signed)
Medical Nutrition Therapy  Appointment Start time:  [phone]  Appointment End time:  4580 Patient is here today alone.  Primary concerns today: patient brought her Omnipod and would like to get training.  Discussed that this was scheduled as a nutrition visit and pump orders are needed for training.  She would like to lose weight. Referral diagnosis: Type 2 Diabetes Preferred learning style:  no preference indicated Learning readiness: ready/contemplating   NUTRITION ASSESSMENT   Anthropometrics  5'10" 303 lbs 08/2020 292 lbs 07/24/2020 293 lbs 07/04/2018    Clinical Medical Hx: Type 2 Diabetes, HTN, hypothyroidism, bipolar disorder, (PCOS per patient) Medications: see list to include:  Vitamin D, Latuda, Lithium, Actos, Lantus 40 units q HS, Novolog 22 units before each meal - states that she is not consistent with the Novolog (forgets) or increased carbohydrates throughout the day- sweetened beverages.  No issues with affording her medication per patient. 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 Patient lives with her husband.  They share shopping and cooking and her husband does most.  Stress / self-care: job related, pain related to back, food security/finances Current average weekly physical activity: rehab for back with daily exercise  24-Hr Dietary Recall First Meal: 1-2 packs instant oatmeal (blueberries and cream), occasional egg or bacon, egg, and cheese sandwich Snack: pizza (1 slice) Second Meal: fast food or leftovers or grab Snack: candied popcorn Third Meal: (before 8 pm) meat, starch, vegetable, dessert if she takes the insulin (cake or cookies) Snack: (10 pm) deli sandwich or PB&J sandwich on honey wheat Beverages: water, diet soda, apple or orange juice or beet juice with greens and apple     Estimated Energy Needs Calories: 2000 Carbohydrate: 225g Protein: 150g Fat: 56g   NUTRITION DIAGNOSIS  NB-1.1 Food and nutrition-related knowledge deficit As related to balance of carbohydrates, protein, fat, meal timing, snacks, mindful eating.  As evidenced by diet hx and patient report.   NUTRITION INTERVENTION  Nutrition education (E-1) on the following topics:  . Medication review, timing of insulin . Balance of protein and carbohydrates . Carbohydrate counting review . Carbohydrate counting resources (meal plan card, [employer]) . Mindfulness . Benefits of exercise . Implication of increased sweetened beverages . Treatment of low blood sugar and symptoms of low blood sugar when glucose is WNL  Handouts Provided Include   Snack list  Meal plan card  My plate  Learning Style & Readiness for Change Teaching method utilized: Visual & Auditory  Demonstrated degree of understanding via: Teach Back  Barriers to learning/adherence to lifestyle change: finances, time, pain  Goals Established by Pt Plan ahead (snacks and meals). Consider giving your husband a grocery list.   MONITORING & EVALUATION Insulin Pump Training 08/19/2020 Dexcom training date pending  Next Steps  Patient is to bring Novolog vial to pump training appointment.

## 2020-08-17 NOTE — Telephone Encounter (Signed)
Called patient to confirm insulin pump training on 3/9 at 4:00. Instructed her to bring a vial of Novolog to this appointment. Dexcom training can possibly be done 4/9.  Will determine.  Prescription request for Novolog and Dexcom sent to her MD.  Antonieta Iba, RD, LDN, CDCES

## 2020-08-18 ENCOUNTER — Encounter: Payer: 59 | Admitting: Nutrition

## 2020-08-18 ENCOUNTER — Other Ambulatory Visit: Payer: Self-pay

## 2020-08-18 DIAGNOSIS — E1165 Type 2 diabetes mellitus with hyperglycemia: Secondary | ICD-10-CM | POA: Diagnosis not present

## 2020-08-18 DIAGNOSIS — Z794 Long term (current) use of insulin: Secondary | ICD-10-CM

## 2020-08-18 NOTE — Progress Notes (Signed)
Patient was trained on how to use the Dexcom sensor.  Her phone was not able to download the app.  She was give a receiver and a sensor, and she inserted the sensor into her right outer upper arm.  The transmitter was linked to the receiver and inserted into the sensor.   She was given a sharing code with a handout with directions to use when she gets home to set up a clarity account to download the receiver and send readings to [facility].  We reviewed how to do this and and she had no final questions.

## 2020-08-18 NOTE — Patient Instructions (Signed)
Change sensor every 10 days Change transmitter every 3 months Read over manual for how to use the sensor/reader. Call Dexcom help line if questions.

## 2020-08-19 ENCOUNTER — Encounter: Payer: 59 | Attending: Internal Medicine | Admitting: Dietician

## 2020-08-19 DIAGNOSIS — Z794 Long term (current) use of insulin: Secondary | ICD-10-CM | POA: Insufficient documentation

## 2020-08-19 DIAGNOSIS — E1165 Type 2 diabetes mellitus with hyperglycemia: Secondary | ICD-10-CM | POA: Insufficient documentation

## 2020-08-20 ENCOUNTER — Other Ambulatory Visit: Payer: Self-pay | Admitting: Critical Care Medicine

## 2020-08-20 ENCOUNTER — Telehealth: Payer: Self-pay | Admitting: Internal Medicine

## 2020-08-20 ENCOUNTER — Other Ambulatory Visit: Payer: Self-pay

## 2020-08-20 ENCOUNTER — Encounter: Payer: Self-pay | Admitting: Critical Care Medicine

## 2020-08-20 ENCOUNTER — Ambulatory Visit: Payer: 59 | Attending: Critical Care Medicine | Admitting: Critical Care Medicine

## 2020-08-20 DIAGNOSIS — N3281 Overactive bladder: Secondary | ICD-10-CM | POA: Insufficient documentation

## 2020-08-20 DIAGNOSIS — Z794 Long term (current) use of insulin: Secondary | ICD-10-CM

## 2020-08-20 DIAGNOSIS — L750 Bromhidrosis: Secondary | ICD-10-CM | POA: Diagnosis not present

## 2020-08-20 DIAGNOSIS — E890 Postprocedural hypothyroidism: Secondary | ICD-10-CM

## 2020-08-20 DIAGNOSIS — F3132 Bipolar disorder, current episode depressed, moderate: Secondary | ICD-10-CM

## 2020-08-20 DIAGNOSIS — E1165 Type 2 diabetes mellitus with hyperglycemia: Secondary | ICD-10-CM

## 2020-08-20 DIAGNOSIS — I10 Essential (primary) hypertension: Secondary | ICD-10-CM | POA: Diagnosis not present

## 2020-08-20 MED ORDER — CLINDAMYCIN PHOS-BENZOYL PEROX 1-5 % EX GEL: Freq: Two times a day (BID) | CUTANEOUS | 1 refills | Status: DC

## 2020-08-20 NOTE — Assessment & Plan Note (Signed)
Now on insulin pump care per endocrinology

## 2020-08-20 NOTE — Assessment & Plan Note (Signed)
Continue thyroid replacement

## 2020-08-20 NOTE — Assessment & Plan Note (Signed)
Plan no change in current blood pressure management

## 2020-08-20 NOTE — Assessment & Plan Note (Signed)
Improved with current deodorants antibacterial soap and BenzaClin
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
## 2020-08-20 NOTE — Assessment & Plan Note (Signed)
Patient encouraged to follow-up with mental health provider no change in medications made

## 2020-08-20 NOTE — Assessment & Plan Note (Signed)
Continue Detrol and refer to urology

## 2020-08-20 NOTE — Telephone Encounter (Signed)
Received a fax from [place] stated that if we can change to humalog for patient because it will be free  Please advise

## 2020-08-21 ENCOUNTER — Other Ambulatory Visit: Payer: Self-pay | Admitting: Internal Medicine

## 2020-08-21 ENCOUNTER — Telehealth: Payer: Self-pay | Admitting: Dietician

## 2020-08-21 MED ORDER — INSULIN LISPRO 100 UNIT/ML ~~LOC~~ SOLN: SUBCUTANEOUS | 3 refills | Status: DC

## 2020-08-21 NOTE — Progress Notes (Signed)
Start time:  1600   End time:  1815  Patient is here today alone for training on the Omnipod DASH Insulin Pump.  It was confirmed that the patient was aware of the following: . Blood glucose (BG) testing . Treating hypoglycemia . Hyperglycemia . Carbohydrate counting   (Patient will be using carbohydrate counting with use of this pump.) . Sick day management  Patient was instructed on the following: Have the following supplies available to be prepared: . Omnipod PDM and pods . Insulin and syringe . Blood glucose meter, strips, lancets, lancing device . Glucose tablets/fast acting source of carbohydrate/Glucagon  Supply Reorder . [place] [phone] ext [phone] . Reorder - info/contact number . When to reorder (when last box of pods is opened)  System Overview . Communication process/distance . Storage guidelines . Diagnostic tests (CT scans/MRI/Xrays) . Travel guidelines  Pod: . Fill port/adhesive/needle cap/pink slide insert/Waterproof  PDM . Battery/button layout/wireless updates Occupational psychologist updates)  PDM Settings . Pump Therapy Order Form with settings below . Basic settings - Personalized lock screen/time/time zone/date/date format . Basal settings o Max basal 2.5U/hr o Basal rates 12 am-12-am 1.25 U/hr o Temp basal   . Bolus settings o Target Blood glucose  120 o Insulin to carb (IC) ratio  1:12 o Correction factor  20 for BG above 140 o Minimum blood glucose for bolus calculations   o Reverse correction o Duration of insulin action  4 hours o Extended bolus o Max bolus Patient was able to accurately enter pump settings into PDM.  Pod Activation . Change Pod  o Room temperature insulin o Fill syringe - min/max amounts o DO NOT prefill Pod o Site selection/rotation & prep o Automated cannula insertion - check infusion site/viewing window & pink slide insert o Blood glucose reminder 1.5 hours after insertion o When to change POD Patient was  able to place pod and view insert.  Home Screen . Status Bar, Menu Icon, Notification/Alarms . Tabs o Dashboard - IOB (if Calculator ON) - Instructed to leave calculator on o Basal (Temp Basal if Temp Basal running) o Pod Info - View Pod detail . Last Bolus, Last BG . Bolus button  Menu icon . PDM function - Temp Basal, Pod, Enter BG, Suspend . Manage Programs & Presets - Basal programs, temp basal presets, bolus presets . Food Library ([employer] app, other tools for carbohydrate counting) . History - Notifications & Alarms, Insulin & BG History . Settings - PDM Device, Pod sites, Reminders, Blood Glucose, Basal & Temp Basal, Bolus . About  Advanced Features (to be discussed at a further date based on patient needs) . Extended bolus . Temp basal rate . Additional basal programs . Presets - Temp basal/Bolus presets  Troubleshooting . Hypoglycemia . Sick day management resource . Hyperglycemia & Ketones  Notifications & Alarms . Custom reminders . Pod Expiration alert . Low Reservoir Chief Operating Officer . Advisory alarms - intermittent tones - response required . [place] alarm - Continuous vibration, and Tone - urgent attention required - Pod Expired, Empty Reservoir, Occlusion, Pod Error, Auto Off, PDM Error, System Error  Ongoing Success . [place] invitation provided . Omnipod app(s) . Register for [employer]  . [facility] 24/7 Customer Care  [phone] . Reviewed User Guide . Showed patient Customer's Bill of Rights and Responsibilities and instructed them to review.   Handouts Provided: . Insulin Pump Packet . Food insecurity handouts emailed to patient  Patient to contact me via  MyChart or phone.

## 2020-08-21 NOTE — Telephone Encounter (Signed)
done

## 2020-08-21 NOTE — Telephone Encounter (Signed)
Called patient 08/18/2020 pm and patient reported no issues with the Omnipod.  Answered question about insulin onboard.  Reminded patient to call for an appointment with Dr. Kelton Pillar.  Called patient 08/21/2020.   She is having no issues with her Omnipod or Dexcom.  Blood glucose 80 a couple of times.   First pod change is Saturday.  She has my number for any questions. Informed patient that her Novolog will be changing to Humalog as she will be able to obtain this for free per [facility]. Instructed her to call to make an appointment with Dr. Kelton Pillar in a couple of weeks.   I will also message the admin pool. Appointment made with myself for 09/04/2020 at 4:00.  Antonieta Iba, RD, LDN, CDCES

## 2020-08-22 ENCOUNTER — Ambulatory Visit: Payer: 59

## 2020-08-22 ENCOUNTER — Other Ambulatory Visit: Payer: Self-pay

## 2020-08-22 DIAGNOSIS — G8929 Other chronic pain: Secondary | ICD-10-CM

## 2020-08-22 DIAGNOSIS — M545 Low back pain, unspecified: Secondary | ICD-10-CM | POA: Diagnosis not present

## 2020-08-22 DIAGNOSIS — M5386 Other specified dorsopathies, lumbar region: Secondary | ICD-10-CM

## 2020-08-22 DIAGNOSIS — R262 Difficulty in walking, not elsewhere classified: Secondary | ICD-10-CM

## 2020-08-22 NOTE — Therapy (Signed)
[place], [place], [address] Phone: [phone]   Fax:  [phone]  Physical Therapy Treatment  Patient Details  Name: Lynn Martinez MRN: [id number] Date of Birth: [birth year] Referring Provider (PT): Elsie Stain, MD   Encounter Date: 08/22/2020   PT End of Session - 08/22/20 1231    Visit Number 4    Number of Visits 7    Date for PT Re-Evaluation 09/12/20    Authorization Type Self pay    Authorization Time Period FOTO- 5th visit    PT Start Time 1030    PT Stop Time 1115    PT Time Calculation (min) 45 min    Activity Tolerance Patient tolerated treatment well;No increased pain    Behavior During Therapy WFL for tasks assessed/performed           Past Medical History:  Diagnosis Date  . Anemia   . Anxiety   . Arthritis    "knees" (09/30/2015)  . Bipolar disorder ([place])   . Depression   . Diabetes mellitus without complication ([place])   . Fibroid    s/p myomectomy 2010, hysterectomy 2013  . Grave's disease   . Heart murmur   . Hormone disorder   . Hypertension   . Hypothyroidism   . 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 Migraine     otc med prn  . PCOS (polycystic ovarian syndrome)   . PMS (premenstrual syndrome)   . PTSD (post-traumatic stress disorder)   . Seasonal allergies   . Thyroid cancer ([place]) 2005  . Thyroid disease   . Type II diabetes mellitus ([place])   . Vertigo     Past Surgical History:  Procedure Laterality Date  . ABDOMINAL HYSTERECTOMY  10/11/2011   Procedure: HYSTERECTOMY ABDOMINAL;  Surgeon: Terrance Mass, MD;  Location: [facility];  Service: Gynecology;  Laterality: N/A;  With Repair of serosa.  . ABDOMINAL HYSTERECTOMY    . CHOLECYSTECTOMY N/A 09/06/2013   Procedure: LAPAROSCOPIC CHOLECYSTECTOMY WITH INTRAOPERATIVE CHOLANGIOGRAM;  Surgeon: Merrie Roof, MD;  Location: [facility];  Service: General;  Laterality: N/A;  .  DIAGNOSTIC LAPAROSCOPY    . DILATION AND CURETTAGE OF UTERUS    . HERNIA REPAIR    . MYOMECTOMY  2010   LAPAROSCOPY  . thyroid removed    . TOTAL THYROIDECTOMY  2006  . UMBILICAL HERNIA REPAIR  1982    There were no vitals filed for this visit.   Subjective Assessment - 08/22/20 1025    Subjective Pt reports she is experiencing pain constantly on her L side. Some of the exs help.    Patient Stated Goals For my back pain to get better and to have better balance.    Currently in Pain? Yes    Pain Score 7     Pain Location Back    Pain Orientation Left    Pain Descriptors / Indicators Sharp;Aching    Pain Type Chronic pain    Pain Onset More than a month ago    Pain Frequency Constant                             [facility] Adult PT Treatment/Exercise - 08/22/20 0001      Exercises   Exercises Lumbar;Knee/Hip      Lumbar Exercises: Stretches   Active Hamstring Stretch Right;Left;1 rep;20 seconds  Other Lumbar Stretch Exercise Seated forward trunk flexion, 10 sec each      Lumbar Exercises: Aerobic   Nustep 5 mins; L5; UEs/LEs      Lumbar Exercises: Seated   Other Seated Lumbar Exercises PPT 10x, 3 sec; PPT c Marching 10x3      Knee/Hip Exercises: Seated   Ball Squeeze 10x; 5 sec; c PPT    Clamshell with TheraBand Green   10x, 5 sec, c PPT   Sit to Sand without UE support;10 reps                  PT Education - 08/22/20 1230    Education Details HEP- was adapted to sitting for pt's comfory    Person(s) Educated Patient    Methods Explanation;Demonstration;Tactile cues;Verbal cues;Handout    Comprehension Verbalized understanding;Returned demonstration;Verbal cues required;Tactile cues required            PT Short Term Goals - 08/08/20 1139      PT SHORT TERM GOAL #1   Title Pt will be Ind in an initial HEP    Status On-going      PT SHORT TERM GOAL #2   Title Pt will voice understanding of measures to assist with the reduction of low  back pain    Status On-going             PT Long Term Goals - 07/26/20 2204      PT LONG TERM GOAL #1   Title Pt's lumbar ext and L SB will improved to full ROM    Baseline 25% limited for both movements    Status New    Target Date 09/13/20      PT LONG TERM GOAL #2   Title Pt will report a decrease in her low back to 6/10 or less with daily activities to improve the function of her low back    Baseline 5-10/10    Status New    Target Date 09/12/20      PT LONG TERM GOAL #3   Title Pt will demonstrate proper body mechanics for household activities to assist in the reduction of low back strain and pain    Status New    Target Date 09/13/20      PT LONG TERM GOAL #4   Title Pt will be Ind in a final HEP to maintain or progress achieved LOF    Status New    Target Date 09/12/20                 Plan - 08/22/20 1232    Clinical Impression Statement During today's PT session, pt reported that lying in a supine/hook lying positions bothered her low back. Lumbopelvic exs were then adapted for sitting. Explained to pt the purpose of ther ex was 2 fold. To increase flexibility and reduce pressure in her low back as well as strengthening to stabilize her low back, pelvis and hip. Pt voiced understanding. After the completion of the session, pt reported a reduction of low back pain from 7/10 to 5/10.    Personal Factors and Comorbidities Time since onset of injury/illness/exacerbation;Comorbidity 1    Comorbidities Obese    Examination-Activity Limitations Stand;Locomotion Level    Stability/Clinical Decision Making Stable/Uncomplicated    Clinical Decision Making Low    Rehab Potential Good    PT Frequency 1x / week    PT Duration 6 weeks    PT Treatment/Interventions ADLs/Self Care Home Management;Cryotherapy;Electrical Stimulation;Ultrasound;Traction;Moist Heat;Iontophoresis 4mg /ml Dexamethasone;Functional  mobility training;Therapeutic activities;Therapeutic  exercise;Balance training;Patient/family education;Manual techniques;Dry needling;Passive range of motion;Taping    PT Next Visit Plan Assess response to HEP in sitting    PT Home Exercise Plan [id number]    Consulted and Agree with Plan of Care Patient           Patient will benefit from skilled therapeutic intervention in order to improve the following deficits and impairments:  Difficulty walking,Decreased range of motion,Obesity,Decreased activity tolerance,Pain,Decreased balance,Decreased strength,Postural dysfunction  Visit Diagnosis: Chronic midline low back pain without sciatica  Decreased ROM of lumbar spine  Difficulty in walking, not elsewhere classified     Problem List Patient Active Problem List   Diagnosis Date Noted  . Overactive bladder 08/20/2020  . 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 Heme positive stool 07/15/2020  . Morbid obesity with BMI of 40.0-44.9, adult ([place]) 07/09/2020  . Bipolar 1 disorder, depressed, moderate ([place]) 02/10/2020  . Breast pain, left 06/11/2019  . Radiculopathy, cervical region 11/21/2018  . Carpal tunnel syndrome, right upper limb 10/08/2018  . Carpal tunnel syndrome, left upper limb 10/08/2018  . Hypertension 09/30/2015  . Type 2 diabetes mellitus with hyperglycemia, with long-term current use of insulin ([place]) 09/30/2015  . S/P cholecystectomy 09/05/2013  . Borderline behavior 03/14/2012    Class: Chronic  . OCD (obsessive compulsive disorder) 03/14/2012    Class: Chronic  . PTSD (post-traumatic stress disorder) 03/14/2012    Class: Chronic  . 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 Lumbago without sciatica 03/12/2012  . Major depression, recurrent ([place]) 03/08/2012    Class: Chronic  . Snoring 12/02/2011  . PCOS (polycystic ovarian syndrome) 09/05/2011  . Hypothyroidism 08/13/2010  . Vitamin D deficiency 08/13/2010  . ANEMIA-NOS 08/13/2010    Gar Ponto MS, PT 08/22/20 12:43 PM  [facility] [address] [place], [place], [address] Phone: [phone]   Fax:  [phone]  Name: Raynetta Osterloh MRN: [id number] Date of Birth: Aug 14, 1970

## 2020-08-23 ENCOUNTER — Encounter: Payer: Self-pay | Admitting: Dietician

## 2020-08-29 ENCOUNTER — Ambulatory Visit: Payer: 59

## 2020-08-31 ENCOUNTER — Telehealth: Payer: Self-pay | Admitting: Internal Medicine

## 2020-08-31 NOTE — Telephone Encounter (Signed)
MEDICATION: pt doesn't know the name "the sticky that the chip fits inside for Dexcom"  PHARMACY:   [id number] - [place], [place] Phone:  [phone]  Fax:  [phone]      HAS THE PATIENT CONTACTED [place]?  no  IS THIS A 90 DAY SUPPLY : no  IS PATIENT OUT OF MEDICATION: yes  IF NOT; HOW MUCH IS LEFT:   LAST APPOINTMENT DATE: @3 /03/2021  NEXT APPOINTMENT DATE:@6 /08/2020  DO WE HAVE YOUR PERMISSION TO LEAVE A DETAILED MESSAGE?:  OTHER COMMENTS:    **Let patient know to contact pharmacy at the end of the day to make sure medication is ready. **  ** Please notify patient to allow 48-72 hours to process**  **Encourage patient to contact the pharmacy for refills or they can request refills through [facility]**

## 2020-09-02 ENCOUNTER — Ambulatory Visit (INDEPENDENT_AMBULATORY_CARE_PROVIDER_SITE_OTHER): Payer: 59 | Admitting: Internal Medicine

## 2020-09-02 ENCOUNTER — Other Ambulatory Visit: Payer: Self-pay

## 2020-09-02 ENCOUNTER — Encounter: Payer: Self-pay | Admitting: Internal Medicine

## 2020-09-02 VITALS — BP 122/80 | HR 69 | Resp 20 | Ht 70.0 in | Wt 302.6 lb

## 2020-09-02 DIAGNOSIS — E1165 Type 2 diabetes mellitus with hyperglycemia: Secondary | ICD-10-CM | POA: Diagnosis not present

## 2020-09-02 DIAGNOSIS — Z794 Long term (current) use of insulin: Secondary | ICD-10-CM | POA: Diagnosis not present

## 2020-09-02 DIAGNOSIS — E890 Postprocedural hypothyroidism: Secondary | ICD-10-CM

## 2020-09-02 LAB — TSH: TSH: 12.52 u[IU]/mL — ABNORMAL HIGH (ref 0.35–4.50)

## 2020-09-02 NOTE — Progress Notes (Signed)
Name: Lynn Martinez  Age/ Sex: 50 y.o., female   MRN/ DOB: [id number], 02-19-1971     PCP: Elsie Stain, MD   Reason for Endocrinology Evaluation: Type 2 Diabetes Mellitus  Initial Endocrine Consultative Visit: 12/20    PATIENT IDENTIFIER: Lynn Martinez is a 50 y.o. female with a past medical history of T2DM, hypothyroidism, bipolar disorder and HTN . The patient has followed with Endocrinology clinic since 06/01/18 for consultative assistance with management of her diabetes.  DIABETIC HISTORY:  Lynn Martinez was diagnosed with T2DM ~ 2019, and started insulin therapy shortly after the diagnosis. She has tried Glipizide and Januvia in the past.Intolerant to Metformin .  Her hemoglobin A1c has ranged from 9.1%  in 2017, peaking at 11.1% in 2019.    Thyroid History : She was diagnosed with Graves' disease in 2004. She was initially treated with thionamides. In 07/2003, she was offered treatment with RAI therapy vs surgical options, pt elected to be treated surgically . She underwent total thyroidectomy in 08/15/2003. 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 She was started on LT-4 replacement after surgery and has been compliant with her regimen.   She got married to [employer] (Gene) on 06/14/2019   Omnipod started 08/19/2020  SUBJECTIVE:   During the last visit (11/04/2019): A1c was 5.9 % we continued lantus, novolog and pioglitazone.      Today (09/02/2020): Lynn Martinez is here for a  follow up on her diabetes management . She  Has has the [place]. has been checking glucose multiple times a day through Dexcom . Has been getting hypoglycemic episodes daily, mainly occurring in the fasting status.      Denies nausea or diarrhea   This patient with type 2 diabetes is treated with Omnipod (insulin pump). During the visit the pump basal and bolus doses were reviewed including carb/insulin  rations and supplemental doses. The clinical list was updated. The glucose meter download was reviewed in detail to determine if the current pump settings are providing the best glycemic control without excessive hypoglycemia.  Pump and meter download:    Pump   Omnipod    Settings   Insulin type   Humalog    Basal rate       0000 1.25              I:C ratio       0000  1:12                  Sensitivity       0000  20      Goal       0000  70-110             Type & Model of Pump: OMniPOD Insulin Type: Currently using Humalog    PUMP STATISTICS: Average BG: 123 BG Readings: 1.4 day Average Daily Carbs (g): 138.3 Average Total Daily Insulin: 34.5 Average Daily Basal: 25.6 (74 %) Average Daily Bolus: 9 (26 %)       CONTINUOUS GLUCOSE MONITORING RECORD INTERPRETATION    Dates of Recording: 3/5-3/18/2022   Sensor description:Dexcom   Results statistics:   CGM use % of time 71  Average and SD 127/32  Time in range   93     %  % Time Above 180 6  % Time above 250 0  % Time Below target <1      Glycemic patterns summary: BG's optimal  through the day and night, occasional hypoglycemia over night   Hyperglycemic episodes  N/S  Hypoglycemic episodes occurred fasting   Overnight periods: trends down at times        HOME DIABETES REGIMEN:  Humalog  Actos 30 mg daily          DIABETIC COMPLICATIONS: Microvascular complications:    Denies: neuropathy, nephropathy, retinopathy  Last eye exam: Completed 06/2019  Macrovascular complications:    Denies: CAD, PVD, CVA        HISTORY:  Past Medical History:  Past Medical History:  Diagnosis Date  . Anemia   . Anxiety   . Arthritis    "knees" (09/30/2015)  . Bipolar disorder ([place])   . Depression   . Diabetes mellitus without complication ([place])   . Fibroid    s/p myomectomy 2010, hysterectomy 2013  . Grave's disease   . Heart murmur   . Hormone disorder   .  Hypertension   . Hypothyroidism   . 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 Migraine     otc med prn  . PCOS (polycystic ovarian syndrome)   . PMS (premenstrual syndrome)   . PTSD (post-traumatic stress disorder)   . Seasonal allergies   . Thyroid cancer ([place]) 2005  . Thyroid disease   . Type II diabetes mellitus ([place])   . Vertigo    Past Surgical History:  Past Surgical History:  Procedure Laterality Date  . ABDOMINAL HYSTERECTOMY  10/11/2011   Procedure: HYSTERECTOMY ABDOMINAL;  Surgeon: Terrance Mass, MD;  Location: [facility];  Service: Gynecology;  Laterality: N/A;  With Repair of serosa.  . ABDOMINAL HYSTERECTOMY    . CHOLECYSTECTOMY N/A 09/06/2013   Procedure: LAPAROSCOPIC CHOLECYSTECTOMY WITH INTRAOPERATIVE CHOLANGIOGRAM;  Surgeon: Merrie Roof, MD;  Location: [facility];  Service: General;  Laterality: N/A;  . DIAGNOSTIC LAPAROSCOPY    . DILATION AND CURETTAGE OF UTERUS    . HERNIA REPAIR    . MYOMECTOMY  2010   LAPAROSCOPY  . thyroid removed    . TOTAL THYROIDECTOMY  2006  . UMBILICAL HERNIA REPAIR  1982    Social History:  reports that she has never smoked. She has never used smokeless tobacco. She reports that she does not drink alcohol and does not use drugs. Family History:  Family History  Problem Relation Age of Onset  . Thyroid disease Mother   . Diabetes Father   . Hypertension Father   . Heart disease Father   . Heart attack Father   . Cancer Maternal Aunt        BRAIN  . Cancer Maternal Grandmother        LYMPHOMA  . Hypertension Maternal Grandmother   . Cancer Paternal Grandmother        PANCREATIC  . Diabetes Paternal Grandmother   . Hypertension Paternal Grandmother   . Hypertension Maternal Grandfather   . Diabetes Maternal Grandfather   . Hypertension Paternal Grandfather   . Pancreatic cancer Paternal Grandfather      HOME MEDICATIONS: Allergies as of 09/02/2020      Reactions   Bee Venom    Codeine Itching    Tolerates Hydrocodone OK.   Ibuprofen Hives, Other (See Comments)   Happened in childhood   Ibuprofen Hives, Swelling   Metformin And Related Other (See Comments)   Muscle weakness   Morphine Itching, Other (See Comments)   Throat swelling   Penicillins  Childhood Allergy Has patient had a PCN reaction causing immediate rash, facial/tongue/throat swelling, SOB or lightheadedness with hypotension: Unknown Has patient had a PCN reaction causing severe rash involving mucus membranes or skin necrosis: Unknown Has patient had a PCN reaction that required hospitalization: Unknown Has patient had a PCN reaction occurring within the last 10 years: Unknown If all of the above answers are "NO", then may proceed with Cephalosporin use.   Aspirin Hives, Swelling, Rash      Medication List       Accurate as of September 02, 2020  7:34 AM. If you have any questions, ask your nurse or doctor.        atorvastatin 10 MG tablet Commonly known as: LIPITOR Take 1 tablet (10 mg total) by mouth daily. To lower cholesterol   citalopram 20 MG tablet Commonly known as: CeleXA Take 1 tablet (20 mg total) by mouth daily.   clindamycin-benzoyl peroxide gel Commonly known as: BenzaClin Apply topically 2 (two) times daily. To underarms, groin areas   Dexcom G6 Sensor Misc 1 Device by Does not apply route as directed.   Dexcom G6 Transmitter Misc 1 Device by Does not apply route as directed.   diclofenac Sodium 1 % Gel Commonly known as: VOLTAREN Apply topically.   gabapentin 300 MG capsule Commonly known as: Neurontin Take 2 capsules (600 mg total) by mouth daily.   hydrOXYzine 25 MG tablet Commonly known as: ATARAX/VISTARIL Take 1 tablet (25 mg total) by mouth 2 (two) times daily.   insulin lispro 100 UNIT/ML injection Commonly known as: HumaLOG Max dAILY 100 UNITS per pump use   Lantus SoloStar 100 UNIT/ML Solostar Pen Generic drug: insulin glargine Inject 50 Units into the skin  daily.   Levothyroxine Sodium 137 MCG Caps Take 1 capsule (137 mcg total) by mouth daily before breakfast.   lithium carbonate 450 MG CR tablet Commonly known as: ESKALITH Take 2 capsules at bedtime   lurasidone 80 MG Tabs tablet Commonly known as: LATUDA Take 1 tablet (80 mg total) by mouth at bedtime.   methocarbamol 500 MG tablet Commonly known as: Robaxin Take 1 tablet (500 mg total) by mouth 3 (three) times daily as needed for muscle spasms.   omeprazole 40 MG capsule Commonly known as: PRILOSEC Take 1 capsule (40 mg total) by mouth 2 (two) times daily.   OmniPod Dash 5 Pack Pods Misc 1 Device by Does not apply route as directed.   pioglitazone 30 MG tablet Commonly known as: ACTOS Take 1 tablet (30 mg total) by mouth daily.   prazosin 1 MG capsule Commonly known as: MINIPRESS Take 1 capsule (1 mg total) by mouth at bedtime. For sleep/nightmares   propranolol 40 MG tablet Commonly known as: INDERAL Take 1 tablet (40 mg total) by mouth 2 (two) times daily.   Simethicone 125 MG Caps Take twice daily   tolterodine 4 MG 24 hr capsule Commonly known as: Detrol LA Take 1 capsule (4 mg total) by mouth daily.   TRUEplus Pen Needles 32G X 4 MM Misc Generic drug: Insulin Pen Needle Use as instructed to inject insulin.   Vitamin D (Ergocalciferol) 1.25 MG (50000 UNIT) Caps capsule Commonly known as: DRISDOL Take 1 capsule (50,000 Units total) by mouth every 7 (seven) days.        OBJECTIVE:   Vital Signs: BP 122/80 (BP Location: Left Arm, Patient Position: Sitting, Cuff Size: Large)   Pulse 69   Resp 20   Ht 5\' 10"  (1.778 m)  Wt (!) 302 lb 9.6 oz (137.3 kg)   LMP 09/20/2011   SpO2 98%   BMI 43.42 kg/m   Wt Readings from Last 3 Encounters:  09/02/20 (!) 302 lb 9.6 oz (137.3 kg)  08/13/20 (!) 303 lb 8 oz (137.7 kg)  08/06/20 298 lb (135.2 kg)     Exam: General: Pt appears well and is in NAD  Lungs: Clear with good BS bilat with no rales, rhonchi,  or wheezes  Heart: RRR with normal S1 and S2 and no gallops; no murmurs; no rub  Extremities: No pretibial edema.   Neuro: MS is good with appropriate affect, pt is alert and Ox3   DM foot exam:07/05/2019 The skin of the feet is intact without sores or ulcerations. The pedal pulses are 2+ on right and 2+ on left. The sensation is intact to a screening 5.07, 10 gram monofilament bilaterally     DATA REVIEWED:  Lab Results  Component Value Date   LDLCALC 93 07/09/2020   CREATININE 0.76 07/09/2020   Results for Lynn Martinez, Lynn Martinez (MRN [id number]) as of 07/27/2020 08:06  Ref. Range 07/09/2020 09:51  TSH Latest Ref Range: 0.450 - 4.500 uIU/mL 8.110 (H)    ASSESSMENT / PLAN / RECOMMENDATIONS:   1) Type 2 Diabetes Mellitus, Poorly Controlled, Without complications - Most recent A1c of 13.7 %. Goal A1c < 7.0 %.    - Too soon to repeat A1c today but in reviewing her pump and CGM download, her BG's  Been optimal , will reduce basal rate due to hypoglycemia - She was just started on the Omnipod pump and is doing great , she has been noted with hypoglycemia , will adjust the basal rate - She has been couting carbs and feels comfortable      MEDICATIONS:   Continue Pioglitazone 30 mg daily    Humalog    Pump   Omnipod    Settings   Insulin type   Humalog    Basal rate       0000 1.20              I:C ratio       0000  1:12                  Sensitivity       0000  20      Goal       0000  70-110            EDUCATION / INSTRUCTIONS:  BG monitoring instructions: Patient is instructed to check her blood sugars 4 times a day,before meals and bedtime.  Call [facility] if: BG persistently < 70 or > 300. . I reviewed the Rule of 15 for the treatment of hypoglycemia in detail with the patient. Literature supplied.     2) Diabetic complications:   Eye: Does not have known diabetic retinopathy.   Neuro/ Feet: Does not have known diabetic  peripheral neuropathy.  Renal: Patient does not have known baseline CKD. She is not on an ACEI/ARB at present.    3) Post-Surgical Hypothyroidism :   She is clinically euthyroid   Despite increasing levothyroxine from 125 to 137 mcg , her TSH is worse and I suspect there's compliance issues as she did not take it today  - No changes at this time, but will encourage compliance  Path report shows no evidence of atypia or malignancy as per records from "care everywhere", these results were conveyed to the  patient, as she was under the impression she had possible cancer in her thyroid.     Medication  Continue Levothyroxine 137 mcg daily     F/U in 3 months     Signed electronically by: Mack Guise, MD  [facility]  [facility] [address] [place], [place] [address] Phone: [phone] FAX: [phone]   CC: Elsie Stain, MD [address] [place] [address] Phone: [phone]  Fax: [phone]  Return to Endocrinology clinic as below: Future Appointments  Date Time Provider [place]  09/04/2020  4:00 PM Clydell Hakim, RD [place] [facility]  09/09/2020  9:10 AM Thornton Park, MD [facility]  09/12/2020 10:30 AM Gar Ponto, PT [facility]  09/14/2020  1:00 PM [place] Crane, MD [facility] None  09/19/2020 10:30 AM Gar Ponto, PT [facility]  10/14/2020  8:30 AM Elsie Stain, MD [facility] None  11/13/2020  3:40 PM Mayo Owczarzak, Melanie Crazier, MD [facility] None

## 2020-09-04 ENCOUNTER — Other Ambulatory Visit: Payer: Self-pay

## 2020-09-04 ENCOUNTER — Encounter: Payer: Self-pay | Admitting: Dietician

## 2020-09-04 ENCOUNTER — Encounter: Payer: 59 | Attending: Internal Medicine | Admitting: Dietician

## 2020-09-04 DIAGNOSIS — Z794 Long term (current) use of insulin: Secondary | ICD-10-CM | POA: Insufficient documentation

## 2020-09-04 DIAGNOSIS — E1165 Type 2 diabetes mellitus with hyperglycemia: Secondary | ICD-10-CM | POA: Insufficient documentation

## 2020-09-04 MED ORDER — DEXCOM G6 SENSOR MISC: 1.0000 | 1 refills | Status: DC

## 2020-09-04 NOTE — Patient Instructions (Addendum)
  If you get a new phone, be sure it is compatability with the Dexcom.  [email or web address]  Make a Dexom account Make a Clarity account. [email or web address]  Use the same user name and password as your Dexcom account.  See the handout provided.  Share your information using the code provided via e-mail.  Find the e-mal from Lynn Martinez for instructions for sharing the omnipod information with our office.

## 2020-09-04 NOTE — Telephone Encounter (Signed)
Refill for the Dexcom sensors

## 2020-09-04 NOTE — Progress Notes (Signed)
Visit Start:  8280  Visit End:  0349  Patient is here today alone.  She was last seen by this RD 08/19/2020 for training on the Omnipod Dash insulin pump.  Patient states that she loves the glucose numbers that she is getting since wearing the Omnipod.  She has had 2-3 low blood sugar readings and basal settings were changed by MD.  This is to follow up for this.  She states that the Omnipod fell off yesterday (day 1) possibly due to placement vertically on her stomach.  She states that she has not had time to put another one back on and has been bolusing with insulin injections.  She has successfully started PODS since education 3/9 and has no concerns related to this. Discussed that horizontal placement on the stomach is best.  The sample Dexcom ran out and her new Dexcom is at the pharmacy to be picked up.  She will complete information on line to share the Dexcom and Omnipod information with the [facility] office.  Another Share Code for the Dexcom was e-mailed to her today.  We discussed scenario of bolusing for 45 grams of carbohydrates (as an example) but not eating the full amount.  Stated that her Insulin to Carbohydrates ratio is 1:12 (1 unit of insulin per 12 grams carbs).  If it is a smaller amount of carbs that she did not eat, this may not be an issue, if a larger amount was not eaten, she may need to eat some other carb such as fruit to cover but in the least, she should monitor her Dexcom readings for any sudden sensor glucose decrease warnings.  Discussed that if she gets a new phone to check on the Dexcom app for compatibility as this would be needed when the Omnipod 5 upgrade is approved for type 2 diabetes.  Plan:  If you get a new phone, be sure it is compatability with the Dexcom.  [email or web address]  Make a Dexom account Make a Clarity account. [email or web address]  Use the same user name and password as your Dexcom account.  See the handout provided.  Share  your information using the code provided via e-mail.  Find the e-mal from [facility] [place] for instructions for sharing the omnipod information with our office.  Follow up in 6 weeks.  Download pump and Dexcom.  Antonieta Iba, RD, LDN, CDCES

## 2020-09-09 ENCOUNTER — Telehealth: Payer: Self-pay

## 2020-09-09 ENCOUNTER — Other Ambulatory Visit: Payer: Self-pay

## 2020-09-09 ENCOUNTER — Ambulatory Visit: Payer: 59 | Admitting: Gastroenterology

## 2020-09-09 ENCOUNTER — Encounter: Payer: Self-pay | Admitting: Gastroenterology

## 2020-09-09 VITALS — BP 110/80 | HR 64 | Ht 70.0 in | Wt 302.0 lb

## 2020-09-09 DIAGNOSIS — R195 Other fecal abnormalities: Secondary | ICD-10-CM

## 2020-09-09 NOTE — Progress Notes (Signed)
Referring Provider: Elsie Stain, MD Primary Care Physician:  Elsie Stain, MD  Reason for Consultation:  Blood in the stool, constipation, loose stools   IMPRESSION:  Occult blood noted in the stool Recent blood and mucus in the rectum Longstanding history of alternating diarrhea and constipation No prior colon cancer screening No GI malignancies in first-degree relatives Maternal grandmother with lymphoma and colon cancer  Occult blood in the stool followed by recent onset of BRB in the stool: Outlet sources such as fissure or hemorrhoids as possible, as well as polyps, mass, ulcers, and colitis, particularly given her history of altered bowel habits.  Given this differential I am recommending a colonoscopy.  Longstanding history of alternating bowel habits with associated abdominal pain relieved by defecation: Suspected irritable bowel syndrome although with recent onset of bleeding, must consider other causes. Will start with colonoscopy. If nondiagnostic, will proceed with evaluation for IBS masqueraders including celiac disease, IBD, food intolerance (lactose, fructose, sucrose), SIBO, thyroid disorder. In the meantime, daily stool bulking agent recommended.    PLAN: - Trial of a stool bulking agent such as psyllium or methylcellulose - Colonoscopy  Please see the "Patient Instructions" section for addition details about the plan.  HPI: Lynn Martinez is a 50 y.o. female referred by Dr. Joya Gaskins for further evaluation of heme positive stools. The history is obtained through the patient and review of her electronic health record.  She has hypertension, hypothyroidism, type 2 diabetes, bipolar disorder, migraines, back pain, vitamin D deficiency, overflow incontinence, OCD, and PTSD.  She recently had an annual visit with Dr. Joya Gaskins.  Fecal occult blood testing recommended for colon cancer screening was positive. There is no associated anemia.  However, over the  last several months she has noticed blood streaking and mucus in her stools.  She has a longstanding history of alternating diarrhea and constipation.  There is occasionally an associated abdominal fullness or pressure that is relieved by defecation.  She will often go 3 to 4 days between bowel movements.  When constipated she will have hard firm stools with associated straining.  She often feels a ripping sensation followed by rectal pain.  Labs 07/09/2020 show a hemoglobin of 13, platelets 404  Maternal grandmother had lymphoma and probable colon cancer. No other known family history of colon cancer or polyps. No family history of uterine/endometrial cancer, pancreatic cancer or gastric/stomach cancer.   Past Medical History:  Diagnosis Date  . Anemia   . Anxiety   . Arthritis    "knees" (09/30/2015)  . Bipolar disorder ([place])   . Depression   . Diabetes mellitus without complication ([place])   . Fibroid    s/p myomectomy 2010, hysterectomy 2013  . Grave's disease   . Heart murmur   . Hormone disorder   . Hypertension   . Hypothyroidism   . 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 Migraine     otc med prn  . PCOS (polycystic ovarian syndrome)   . PMS (premenstrual syndrome)   . PTSD (post-traumatic stress disorder)   . Seasonal allergies   . Thyroid cancer ([place]) 2005  . Thyroid disease   . Type II diabetes mellitus ([place])   . Vertigo     Past Surgical History:  Procedure Laterality Date  . ABDOMINAL HYSTERECTOMY  10/11/2011   Procedure: HYSTERECTOMY ABDOMINAL;  Surgeon: Terrance Mass, MD;  Location: [facility];  Service: Gynecology;  Laterality: N/A;  With  Repair of serosa.  . ABDOMINAL HYSTERECTOMY    . CHOLECYSTECTOMY N/A 09/06/2013   Procedure: LAPAROSCOPIC CHOLECYSTECTOMY WITH INTRAOPERATIVE CHOLANGIOGRAM;  Surgeon: Merrie Roof, MD;  Location: [facility];  Service: General;  Laterality: N/A;  . DIAGNOSTIC LAPAROSCOPY    . DILATION AND CURETTAGE OF  UTERUS    . HERNIA REPAIR    . MYOMECTOMY  2010   LAPAROSCOPY  . thyroid removed    . TOTAL THYROIDECTOMY  2006  . UMBILICAL HERNIA REPAIR  1982    Current Outpatient Medications  Medication Sig Dispense Refill  . atorvastatin (LIPITOR) 10 MG tablet Take 1 tablet (10 mg total) by mouth daily. To lower cholesterol 90 tablet 3  . citalopram (CELEXA) 20 MG tablet Take 1 tablet (20 mg total) by mouth daily. 30 tablet 2  . clindamycin-benzoyl peroxide (BENZACLIN) gel Apply topically 2 (two) times daily. To underarms, groin areas 25 g 1  . Continuous Blood Gluc Sensor (DEXCOM G6 SENSOR) MISC 1 Device by Does not apply route as directed. 9 each 1  . Continuous Blood Gluc Transmit (DEXCOM G6 TRANSMITTER) MISC 1 Device by Does not apply route as directed. 1 each 3  . diclofenac Sodium (VOLTAREN) 1 % GEL Apply topically.    . gabapentin (NEURONTIN) 300 MG capsule Take 2 capsules (600 mg total) by mouth daily. 60 capsule 2  . hydrOXYzine (ATARAX/VISTARIL) 25 MG tablet Take 1 tablet (25 mg total) by mouth 2 (two) times daily. 60 tablet 2  . Insulin Disposable Pump (OMNIPOD DASH 5 PACK PODS) MISC 1 Device by Does not apply route as directed. 9 each 3  . insulin lispro (HUMALOG) 100 UNIT/ML injection Max dAILY 100 UNITS per pump use 90 mL 3  . Insulin Pen Needle (TRUEPLUS PEN NEEDLES) 32G X 4 MM MISC Use as instructed to inject insulin. 100 each 11  . Levothyroxine Sodium 137 MCG CAPS Take 1 capsule (137 mcg total) by mouth daily before breakfast. 90 capsule 3  . lithium carbonate (ESKALITH) 450 MG CR tablet Take 2 capsules at bedtime 60 tablet 2  . lurasidone (LATUDA) 80 MG TABS tablet Take 1 tablet (80 mg total) by mouth at bedtime. 30 tablet 2  . methocarbamol (ROBAXIN) 500 MG tablet Take 1 tablet (500 mg total) by mouth 3 (three) times daily as needed for muscle spasms. 90 tablet 0  . omeprazole (PRILOSEC) 40 MG capsule Take 1 capsule (40 mg total) by mouth 2 (two) times daily. 60 capsule 3  .  pioglitazone (ACTOS) 30 MG tablet Take 1 tablet (30 mg total) by mouth daily. 30 tablet 2  . prazosin (MINIPRESS) 1 MG capsule Take 1 capsule (1 mg total) by mouth at bedtime. For sleep/nightmares 30 capsule 2  . propranolol (INDERAL) 40 MG tablet Take 1 tablet (40 mg total) by mouth 2 (two) times daily. 60 tablet 3  . Simethicone 125 MG CAPS Take twice daily 28 capsule 0  . tolterodine (DETROL LA) 4 MG 24 hr capsule Take 1 capsule (4 mg total) by mouth daily. 30 capsule 3  . Vitamin D, Ergocalciferol, (DRISDOL) 1.25 MG (50000 UNIT) CAPS capsule Take 1 capsule (50,000 Units total) by mouth every 7 (seven) days. 14 capsule 1   No current facility-administered medications for this visit.    Allergies as of 09/09/2020 - Review Complete 09/09/2020  Allergen Reaction Noted  . Bee venom  09/28/2017  . Codeine Itching 08/13/2010  . Ibuprofen Hives and Other (See Comments) 08/13/2010  . Ibuprofen Hives  and Swelling 01/24/2018  . Metformin and related Other (See Comments) 12/07/2014  . Morphine Itching and Other (See Comments) 08/13/2010  . Penicillins  01/24/2018  . Aspirin Hives, Swelling, and Rash 08/13/2010    Family History  Problem Relation Age of Onset  . Thyroid disease Mother   . Diabetes Father   . Hypertension Father   . Heart disease Father   . Heart attack Father   . Cancer Maternal Aunt        BRAIN  . Cancer Maternal Grandmother        LYMPHOMA  . Hypertension Maternal Grandmother   . Cancer Paternal Grandmother        PANCREATIC  . Diabetes Paternal Grandmother   . Hypertension Paternal Grandmother   . Hypertension Maternal Grandfather   . Diabetes Maternal Grandfather   . Hypertension Paternal Grandfather   . Pancreatic cancer Paternal Grandfather      Review of Systems: 12 system ROS is negative except as noted above in addition to allergies, anxiety, arthritis, back pain, vision changes, depression, fatigue, headaches, hearing problems, sleeping problems,  excessive thirst, excessive urination.   Physical Exam: General:   Alert,  well-nourished, pleasant and cooperative in NAD Head:  Normocephalic and atraumatic. Eyes:  Sclera clear, no icterus.   Conjunctiva pink. Ears:  Normal auditory acuity. Nose:  No deformity, discharge,  or lesions. Mouth:  No deformity or lesions.   Neck:  Supple; no masses or thyromegaly. Lungs:  Clear throughout to auscultation.   No wheezes. Heart:  Regular rate and rhythm; no murmurs. Abdomen:  Soft, central obesity, nontender, nondistended, normal bowel sounds, no rebound or guarding. No hepatosplenomegaly.   Rectal:  Deferred  Msk:  Symmetrical. No boney deformities LAD: No inguinal or umbilical LAD Extremities:  No clubbing or edema. Neurologic:  Alert and  oriented x4;  grossly nonfocal Skin:  Intact without significant lesions or rashes. Psych:  Alert and cooperative. Normal mood and affect.    Safiyya Stokes L. Tarri Glenn, MD, MPH 09/09/2020, 11:57 AM

## 2020-09-09 NOTE — Telephone Encounter (Signed)
Called Lynn Martinez and informed her about Dr. Quin Hoop response below. Verbalized acceptance and understanding.

## 2020-09-09 NOTE — Patient Instructions (Addendum)
It was a pleasure to meet you today. Based on our discussion, I am providing you with my recommendations below:  RECOMMENDATION(S):   . I am recommending that you take 1 dose of Metamucil daily. You may purchase this product over the counter  . I am also recommending a colonoscopy to better evaluate your symptoms  COLONOSCOPY:   . You have been scheduled for a colonoscopy. Please follow written instructions given to you at your visit today.   PREP:   . Please pick up your prep supplies at the pharmacy within the next 1-3 days.  INHALERS:   . If you use inhalers (even only as needed), please bring them with you on the day of your procedure.  INSULIN PUMP:  We will contact your provider to request instructions about your insulin pump  COLONOSCOPY TIPS:  . To reduce nausea and dehydration, stay well hydrated for 3-4 days prior to the exam.  . To prevent skin/hemorrhoid irritation - prior to wiping, put A&Dointment or vaseline on the toilet paper. Marland Kitchen Keep a towel or pad on the bed.  Marland Kitchen BEFORE STARTING YOUR PREP, drink  64oz of clear liquids in the morning. This will help to flush the colon and will ensure you are well hydrated!!!!  NOTE - This is in addition to the fluids required for to complete your prep. . Use of a flavored hard candy, such as grape Anise Salvo, can counteract some of the flavor of the prep and may prevent some nausea.   BMI:  . If you are age 23 or younger, your body mass index should be between 19-25. Your There is no height or weight on file to calculate BMI. If this is out of the aformentioned range listed, please consider follow up with your Primary Care Provider.   Thank you for trusting me with your gastrointestinal care!    Thornton Park, MD, MPH

## 2020-09-09 NOTE — Telephone Encounter (Signed)
INSULIN PUMP INSTRUCTIONS    Lynn Martinez 08-Feb-1971 [id number]  Procedure: Colonoscopy Anesthesia type:  MAC Procedure Date: 11/24/20 Provider: Dr. Tarri Glenn  Type of Clearance needed: Pharmacy - INSULIN PUMP  Instructions: Please provide insulin pump instructions  Misc: Pt has also been advised to take her Actos as prescribed the day before her procedure. Advised to hold Actos the day of her procedure. CBG's will be obtained and pt treated based on results of CBG.  Please review request and advise by either responding to this message or by sending your response to the fax # provided below.  Thank you,  [facility]  Phone: [phone] Fax: [phone] ATTENTION: Kerrin Markman, LPN

## 2020-09-09 NOTE — Telephone Encounter (Signed)
Pt will continue to use the pump with continuation of the basal rate.  She will be NPO so the pt with NOT bolus for any carbohydrates .

## 2020-09-12 ENCOUNTER — Ambulatory Visit: Payer: 59 | Attending: Critical Care Medicine

## 2020-09-12 ENCOUNTER — Other Ambulatory Visit: Payer: Self-pay

## 2020-09-12 DIAGNOSIS — G8929 Other chronic pain: Secondary | ICD-10-CM | POA: Insufficient documentation

## 2020-09-12 DIAGNOSIS — M545 Low back pain, unspecified: Secondary | ICD-10-CM | POA: Insufficient documentation

## 2020-09-12 DIAGNOSIS — M5386 Other specified dorsopathies, lumbar region: Secondary | ICD-10-CM | POA: Insufficient documentation

## 2020-09-12 DIAGNOSIS — R262 Difficulty in walking, not elsewhere classified: Secondary | ICD-10-CM | POA: Diagnosis present

## 2020-09-13 NOTE — Therapy (Signed)
[place], [place], [address] Phone: [phone]   Fax:  [phone]  Physical Therapy Treatment  Patient Details  Name: Lynn Martinez MRN: [id number] Date of Birth: 1971-05-24 Referring Provider (PT): Elsie Stain, MD   Encounter Date: 09/12/2020    Past Medical History:  Diagnosis Date  . Anemia   . Anxiety   . Arthritis    "knees" (09/30/2015)  . Bipolar disorder ([place])   . Depression   . Diabetes mellitus without complication ([place])   . Fibroid    s/p myomectomy 2010, hysterectomy 2013  . Grave's disease   . Heart murmur   . Hormone disorder   . Hypertension   . Hypothyroidism   . 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 Migraine     otc med prn  . PCOS (polycystic ovarian syndrome)   . PMS (premenstrual syndrome)   . PTSD (post-traumatic stress disorder)   . Seasonal allergies   . Thyroid cancer ([place]) 2005  . Thyroid disease   . Type II diabetes mellitus ([employer])   . Vertigo     Past Surgical History:  Procedure Laterality Date  . ABDOMINAL HYSTERECTOMY  10/11/2011   Procedure: HYSTERECTOMY ABDOMINAL;  Surgeon: Terrance Mass, MD;  Location: [facility];  Service: Gynecology;  Laterality: N/A;  With Repair of serosa.  . ABDOMINAL HYSTERECTOMY    . CHOLECYSTECTOMY N/A 09/06/2013   Procedure: LAPAROSCOPIC CHOLECYSTECTOMY WITH INTRAOPERATIVE CHOLANGIOGRAM;  Surgeon: Merrie Roof, MD;  Location: [facility];  Service: General;  Laterality: N/A;  . DIAGNOSTIC LAPAROSCOPY    . DILATION AND CURETTAGE OF UTERUS    . HERNIA REPAIR    . MYOMECTOMY  2010   LAPAROSCOPY  . thyroid removed    . TOTAL THYROIDECTOMY  2006  . UMBILICAL HERNIA REPAIR  1982    There were no vitals filed for this visit.   Subjective Assessment - 09/13/20 2318    Subjective Pt reports her L low back pain continues to be an issue, but her HEP helps to manage her pain.    Patient  Stated Goals For my back pain to get better and to have better balance.    Currently in Pain? Yes    Pain Score 7     Pain Location Back    Pain Orientation Left    Pain Descriptors / Indicators Sharp;Aching    Pain Type Chronic pain    Pain Onset More than a month ago    Pain Frequency Constant    Aggravating Factors  Prolonged activity: walking, standing, sitting    Pain Relieving Factors Lying down or reclined              [facility] PT Assessment - 09/13/20 0001      Observation/Other Assessments   Focus on Therapeutic Outcomes (FOTO)  36% functional ability                         [facility] Adult PT Treatment/Exercise - 09/13/20 0001      Exercises   Exercises Lumbar;Knee/Hip      Lumbar Exercises: Stretches   Active Hamstring Stretch Right;Left;1 rep;20 seconds    Other Lumbar Stretch Exercise Seated forward trunk flexion, 10 sec each      Lumbar Exercises: Aerobic   Nustep 5 mins; L5; UEs/LEs      Lumbar Exercises: Standing   Row Both;10  reps   2 sets   Theraband Level (Row) Level 3 (Green)    Shoulder Extension Both;10 reps   2 sets   Theraband Level (Shoulder Extension) Level 3 (Green)      Lumbar Exercises: Seated   Other Seated Lumbar Exercises PPT 10x, 3 sec; PPT c Marching 10x3      Manual Therapy   Manual Therapy Joint mobilization    Joint Mobilization of the L LE grade lll-IV.                  PT Education - 09/13/20 2319    Education Details updated HEP: Posterior chain strengthening-sh extension and rows c green Tband    Person(s) Educated Patient    Methods Explanation;Demonstration;Tactile cues;Verbal cues    Comprehension Verbalized understanding;Returned demonstration;Verbal cues required;Tactile cues required            PT Short Term Goals - 09/13/20 2326      PT SHORT TERM GOAL #1   Title Pt will be Ind in an initial HEP    Status Achieved    Target Date 09/12/20      PT SHORT TERM GOAL #2   Title Pt will voice  understanding of measures to assist with the reduction of low back pain    Status Achieved    Target Date 09/12/20             PT Long Term Goals - 09/13/20 2327      PT LONG TERM GOAL #1   Title Pt's lumbar ext and L SB will improved to full ROM    Baseline 25% limited for both movements    Status On-going    Target Date 10/17/20      PT LONG TERM GOAL #2   Title Pt will report a decrease in her low back to 6/10 or less with daily activities to improve the function of her low back    Baseline 5-10/10    Status On-going    Target Date 10/17/20      PT LONG TERM GOAL #3   Title Pt will demonstrate proper body mechanics for household activities to assist in the reduction of low back strain and pain    Status On-going    Target Date 10/17/20      PT LONG TERM GOAL #4   Title Pt will be Ind in a final HEP to maintain or progress achieved LOF    Status On-going    Target Date 10/17/20                 Plan - 09/13/20 2324    Clinical Impression Statement PT was completed for lumbopelvic strengthening and flexibilty. Posterior chain exs were completed and added to the pt's HEP. To assist in low back flexibility and decompression, R lumbar R UPA and L LE LAD mobs were completed. Following the session, pt reported a reduction in L low back pain to 5/10. Pt will continue to benefit from PT 1w4 to decrease pain and optimize functional mobility.    Personal Factors and Comorbidities Time since onset of injury/illness/exacerbation;Comorbidity 1    Comorbidities Obese    Examination-Activity Limitations Stand;Locomotion Level    Stability/Clinical Decision Making Stable/Uncomplicated    Clinical Decision Making Low    Rehab Potential Good    PT Frequency 1x / week    PT Duration 6 weeks    PT Treatment/Interventions ADLs/Self Care Home Management;Cryotherapy;Electrical Stimulation;Ultrasound;Traction;Moist Heat;Iontophoresis 4mg /ml Dexamethasone;Functional mobility  training;Therapeutic activities;Therapeutic exercise;Balance  training;Patient/family education;Manual techniques;Dry needling;Passive range of motion;Taping    PT Next Visit Plan Assess response to lumbar R UPA and L LE LAD mobs    PT Home Exercise Plan [id number]-Posterior chain strengthening-sh extension and rows c green Tband    Consulted and Agree with Plan of Care Patient           Patient will benefit from skilled therapeutic intervention in order to improve the following deficits and impairments:  Difficulty walking,Decreased range of motion,Obesity,Decreased activity tolerance,Pain,Decreased balance,Decreased strength,Postural dysfunction  Visit Diagnosis: Chronic midline low back pain without sciatica  Decreased ROM of lumbar spine  Difficulty in walking, not elsewhere classified     Problem List Patient Active Problem List   Diagnosis Date Noted  . Overactive bladder 08/20/2020  . 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 Heme positive stool 07/15/2020  . Morbid obesity with BMI of 40.0-44.9, adult ([place]) 07/09/2020  . Bipolar 1 disorder, depressed, moderate ([place]) 02/10/2020  . Breast pain, left 06/11/2019  . Radiculopathy, cervical region 11/21/2018  . Carpal tunnel syndrome, right upper limb 10/08/2018  . Carpal tunnel syndrome, left upper limb 10/08/2018  . Hypertension 09/30/2015  . Type 2 diabetes mellitus with hyperglycemia, with long-term current use of insulin ([place]) 09/30/2015  . S/P cholecystectomy 09/05/2013  . Borderline behavior 03/14/2012    Class: Chronic  . OCD (obsessive compulsive disorder) 03/14/2012    Class: Chronic  . PTSD (post-traumatic stress disorder) 03/14/2012    Class: Chronic  . 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 Lumbago without sciatica 03/12/2012  . Major depression, recurrent ([place]) 03/08/2012    Class: Chronic  . Snoring 12/02/2011  . PCOS (polycystic ovarian syndrome) 09/05/2011  . Hypothyroidism 08/13/2010   . Vitamin D deficiency 08/13/2010  . ANEMIA-NOS 08/13/2010    Gar Ponto MS, PT 09/13/20 11:46 PM  [facility] [address] [place], [place], [address] Phone: [phone]   Fax:  [phone]  Name: Lynn Martinez MRN: [id number] Date of Birth: [birth year]

## 2020-09-14 ENCOUNTER — Ambulatory Visit (HOSPITAL_COMMUNITY): Payer: No Payment, Other | Admitting: Psychiatry

## 2020-09-18 ENCOUNTER — Other Ambulatory Visit: Payer: Self-pay

## 2020-09-18 MED FILL — Omeprazole Cap Delayed Release 40 MG: ORAL | 30 days supply | Qty: 60 | Fill #0 | Status: AC

## 2020-09-18 MED FILL — Lurasidone HCl Tab 80 MG: ORAL | 30 days supply | Qty: 30 | Fill #0 | Status: AC

## 2020-09-18 MED FILL — Pioglitazone HCl Tab 30 MG (Base Equiv): ORAL | 30 days supply | Qty: 30 | Fill #0 | Status: AC

## 2020-09-18 MED FILL — Atorvastatin Calcium Tab 10 MG (Base Equivalent): ORAL | 30 days supply | Qty: 30 | Fill #0 | Status: AC

## 2020-09-18 MED FILL — Lithium Carbonate Tab ER 450 MG: ORAL | 30 days supply | Qty: 60 | Fill #0 | Status: AC

## 2020-09-18 MED FILL — Hydroxyzine HCl Tab 25 MG: ORAL | 30 days supply | Qty: 60 | Fill #0 | Status: AC

## 2020-09-18 MED FILL — Prazosin HCl Cap 1 MG: ORAL | 30 days supply | Qty: 30 | Fill #0 | Status: AC

## 2020-09-18 MED FILL — Gabapentin Cap 300 MG: ORAL | 30 days supply | Qty: 60 | Fill #0 | Status: AC

## 2020-09-18 MED FILL — Tolterodine Tartrate Cap ER 24HR 4 MG: ORAL | 30 days supply | Qty: 30 | Fill #0 | Status: CN

## 2020-09-18 MED FILL — Ergocalciferol Cap 1.25 MG (50000 Unit): ORAL | 28 days supply | Qty: 4 | Fill #0 | Status: AC

## 2020-09-18 MED FILL — Propranolol HCl Tab 40 MG: ORAL | 30 days supply | Qty: 60 | Fill #0 | Status: AC

## 2020-09-18 MED FILL — Levothyroxine Sodium Tab 137 MCG: ORAL | 30 days supply | Qty: 30 | Fill #0 | Status: AC

## 2020-09-19 ENCOUNTER — Other Ambulatory Visit: Payer: Self-pay

## 2020-09-19 ENCOUNTER — Ambulatory Visit: Payer: 59

## 2020-09-19 DIAGNOSIS — R262 Difficulty in walking, not elsewhere classified: Secondary | ICD-10-CM

## 2020-09-19 DIAGNOSIS — M545 Low back pain, unspecified: Secondary | ICD-10-CM

## 2020-09-19 DIAGNOSIS — M5386 Other specified dorsopathies, lumbar region: Secondary | ICD-10-CM

## 2020-09-19 DIAGNOSIS — G8929 Other chronic pain: Secondary | ICD-10-CM

## 2020-09-19 NOTE — Therapy (Signed)
[place], [place], [address] Phone: [phone]   Fax:  [phone]  Physical Therapy Treatment  Patient Details  Name: Lynn Martinez MRN: [id number] Date of Birth: 1970-06-15 Referring Provider (PT): Elsie Stain, MD   Encounter Date: 09/19/2020   PT End of Session - 09/19/20 1035    Visit Number 6    Number of Visits 7    Date for PT Re-Evaluation 10/17/20    Authorization Type [employer]    Authorization Time Period FOTO- 5th visit    PT Start Time 1035    PT Stop Time 1115    PT Time Calculation (min) 40 min    Activity Tolerance Patient tolerated treatment well;No increased pain    Behavior During Therapy WFL for tasks assessed/performed           Past Medical History:  Diagnosis Date  . Anemia   . Anxiety   . Arthritis    "knees" (09/30/2015)  . Bipolar disorder ([place])   . Depression   . Diabetes mellitus without complication ([place])   . Fibroid    s/p myomectomy 2010, hysterectomy 2013  . Grave's disease   . Heart murmur   . Hormone disorder   . Hypertension   . Hypothyroidism   . 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 Migraine     otc med prn  . PCOS (polycystic ovarian syndrome)   . PMS (premenstrual syndrome)   . PTSD (post-traumatic stress disorder)   . Seasonal allergies   . Thyroid cancer ([place]) 2005  . Thyroid disease   . Type II diabetes mellitus ([place])   . Vertigo     Past Surgical History:  Procedure Laterality Date  . ABDOMINAL HYSTERECTOMY  10/11/2011   Procedure: HYSTERECTOMY ABDOMINAL;  Surgeon: Terrance Mass, MD;  Location: [facility];  Service: Gynecology;  Laterality: N/A;  With Repair of serosa.  . ABDOMINAL HYSTERECTOMY    . CHOLECYSTECTOMY N/A 09/06/2013   Procedure: LAPAROSCOPIC CHOLECYSTECTOMY WITH INTRAOPERATIVE CHOLANGIOGRAM;  Surgeon: Merrie Roof, MD;  Location: [facility];  Service: General;  Laterality: N/A;   . DIAGNOSTIC LAPAROSCOPY    . DILATION AND CURETTAGE OF UTERUS    . HERNIA REPAIR    . MYOMECTOMY  2010   LAPAROSCOPY  . thyroid removed    . TOTAL THYROIDECTOMY  2006  . UMBILICAL HERNIA REPAIR  1982    There were no vitals filed for this visit.   Subjective Assessment - 09/19/20 1040    Subjective Pt reports less sharp pain/catch in her low back this week. Pt reports situation at work limited her ability to    Patient Stated Goals For my back pain to get better and to have better balance.    Pain Score 6     Pain Location Back    Pain Orientation Left;Lower;Posterior    Pain Descriptors / Indicators Aching;Sharp    Pain Type Chronic pain    Pain Onset More than a month ago    Pain Frequency Constant    Multiple Pain Sites No                             [facility] Adult PT Treatment/Exercise - 09/19/20 0001      Exercises   Exercises Lumbar;Knee/Hip      Lumbar Exercises: Stretches   Active Hamstring Stretch Right;Left;1  rep;20 seconds    Lower Trunk Rotation 5 reps;10 seconds    Other Lumbar Stretch Exercise Seated forward trunk flexion, 10 sec each      Lumbar Exercises: Aerobic   Nustep 5 mins; L5; UEs/LEs      Manual Therapy   Manual Therapy Joint mobilization    Joint Mobilization LAD of the L LE grade lll-IV. R UPA grade 3 mobs to L1-L5.                    PT Short Term Goals - 09/13/20 2326      PT SHORT TERM GOAL #1   Title Pt will be Ind in an initial HEP    Status Achieved    Target Date 09/12/20      PT SHORT TERM GOAL #2   Title Pt will voice understanding of measures to assist with the reduction of low back pain    Status Achieved    Target Date 09/12/20             PT Long Term Goals - 09/13/20 2327      PT LONG TERM GOAL #1   Title Pt's lumbar ext and L SB will improved to full ROM    Baseline 25% limited for both movements    Status On-going    Target Date 10/17/20      PT LONG TERM GOAL #2   Title Pt  will report a decrease in her low back to 6/10 or less with daily activities to improve the function of her low back    Baseline 5-10/10    Status On-going    Target Date 10/17/20      PT LONG TERM GOAL #3   Title Pt will demonstrate proper body mechanics for household activities to assist in the reduction of low back strain and pain    Status On-going    Target Date 10/17/20      PT LONG TERM GOAL #4   Title Pt will be Ind in a final HEP to maintain or progress achieved LOF    Status On-going    Target Date 10/17/20                 Plan - 09/19/20 1036    Personal Factors and Comorbidities Time since onset of injury/illness/exacerbation;Comorbidity 1    Comorbidities Obese    Examination-Activity Limitations Stand;Locomotion Level    Stability/Clinical Decision Making Stable/Uncomplicated    Clinical Decision Making Low    Rehab Potential Good    PT Frequency 1x / week    PT Duration 6 weeks    PT Treatment/Interventions ADLs/Self Care Home Management;Cryotherapy;Electrical Stimulation;Ultrasound;Traction;Moist Heat;Iontophoresis 4mg /ml Dexamethasone;Functional mobility training;Therapeutic activities;Therapeutic exercise;Balance training;Patient/family education;Manual techniques;Dry needling;Passive range of motion;Taping    PT Next Visit Plan Assess response to lumbar R UPA and L LE LAD mobs    PT Home Exercise Plan [id number]-Posterior chain strengthening-sh extension and rows c green Tband    Consulted and Agree with Plan of Care Patient           Patient will benefit from skilled therapeutic intervention in order to improve the following deficits and impairments:  Difficulty walking,Decreased range of motion,Obesity,Decreased activity tolerance,Pain,Decreased balance,Decreased strength,Postural dysfunction  Visit Diagnosis: Chronic midline low back pain without sciatica  Decreased ROM of lumbar spine  Difficulty in walking, not elsewhere  classified     Problem List Patient Active Problem List   Diagnosis Date Noted  . Overactive bladder 08/20/2020  .  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 Heme positive stool 07/15/2020  . Morbid obesity with BMI of 40.0-44.9, adult ([place]) 07/09/2020  . Bipolar 1 disorder, depressed, moderate ([place]) 02/10/2020  . Breast pain, left 06/11/2019  . Radiculopathy, cervical region 11/21/2018  . Carpal tunnel syndrome, right upper limb 10/08/2018  . Carpal tunnel syndrome, left upper limb 10/08/2018  . Hypertension 09/30/2015  . Type 2 diabetes mellitus with hyperglycemia, with long-term current use of insulin ([place]) 09/30/2015  . S/P cholecystectomy 09/05/2013  . Borderline behavior 03/14/2012    Class: Chronic  . OCD (obsessive compulsive disorder) 03/14/2012    Class: Chronic  . PTSD (post-traumatic stress disorder) 03/14/2012    Class: Chronic  . 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 Lumbago without sciatica 03/12/2012  . Major depression, recurrent ([place]) 03/08/2012    Class: Chronic  . Snoring 12/02/2011  . PCOS (polycystic ovarian syndrome) 09/05/2011  . Hypothyroidism 08/13/2010  . Vitamin D deficiency 08/13/2010  . ANEMIA-NOS 08/13/2010   Gar Ponto MS, PT 09/19/20 11:58 AM  [facility] [address] [place], [place], [address] Phone: [phone]   Fax:  [phone]  Name: Jennifier Smitherman MRN: [id number] Date of Birth: May 31, 1971

## 2020-09-22 ENCOUNTER — Other Ambulatory Visit: Payer: Self-pay

## 2020-09-23 ENCOUNTER — Other Ambulatory Visit: Payer: Self-pay

## 2020-09-30 ENCOUNTER — Other Ambulatory Visit: Payer: Self-pay

## 2020-10-03 ENCOUNTER — Encounter: Payer: Self-pay | Admitting: Rehabilitative and Restorative Service Providers"

## 2020-10-03 ENCOUNTER — Ambulatory Visit: Payer: 59 | Admitting: Rehabilitative and Restorative Service Providers"

## 2020-10-03 ENCOUNTER — Other Ambulatory Visit: Payer: Self-pay

## 2020-10-03 DIAGNOSIS — M545 Low back pain, unspecified: Secondary | ICD-10-CM | POA: Diagnosis not present

## 2020-10-03 DIAGNOSIS — G8929 Other chronic pain: Secondary | ICD-10-CM

## 2020-10-03 DIAGNOSIS — M5386 Other specified dorsopathies, lumbar region: Secondary | ICD-10-CM

## 2020-10-03 DIAGNOSIS — R262 Difficulty in walking, not elsewhere classified: Secondary | ICD-10-CM

## 2020-10-03 NOTE — Therapy (Signed)
[name], [place], [address] Phone: [phone]   Fax:  [phone]  Physical Therapy Treatment  Patient Details  Name: Lynn Martinez MRN: [id number] Date of Birth: 03-Oct-1970 Referring Provider (PT): Elsie Stain, MD   Encounter Date: 10/03/2020   PT End of Session - 10/03/20 1125    Visit Number 7    Number of Visits 7    Date for PT Re-Evaluation 10/17/20    Authorization Type [employer]    PT Start Time 1120    PT Stop Time 1205    PT Time Calculation (min) 45 min    Activity Tolerance Patient tolerated treatment well;No increased pain    Behavior During Therapy WFL for tasks assessed/performed           Past Medical History:  Diagnosis Date  . Anemia   . Anxiety   . Arthritis    "knees" (09/30/2015)  . Bipolar disorder ([place])   . Depression   . Diabetes mellitus without complication ([place])   . Fibroid    s/p myomectomy 2010, hysterectomy 2013  . Grave's disease   . Heart murmur   . Hormone disorder   . Hypertension   . Hypothyroidism   . 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 Migraine     otc med prn  . PCOS (polycystic ovarian syndrome)   . PMS (premenstrual syndrome)   . PTSD (post-traumatic stress disorder)   . Seasonal allergies   . Thyroid cancer ([place]) 2005  . Thyroid disease   . Type II diabetes mellitus ([place])   . Vertigo     Past Surgical History:  Procedure Laterality Date  . ABDOMINAL HYSTERECTOMY  10/11/2011   Procedure: HYSTERECTOMY ABDOMINAL;  Surgeon: Terrance Mass, MD;  Location: [facility];  Service: Gynecology;  Laterality: N/A;  With Repair of serosa.  . ABDOMINAL HYSTERECTOMY    . CHOLECYSTECTOMY N/A 09/06/2013   Procedure: LAPAROSCOPIC CHOLECYSTECTOMY WITH INTRAOPERATIVE CHOLANGIOGRAM;  Surgeon: Merrie Roof, MD;  Location: [facility];  Service: General;  Laterality: N/A;  . DIAGNOSTIC LAPAROSCOPY    . DILATION AND  CURETTAGE OF UTERUS    . HERNIA REPAIR    . MYOMECTOMY  2010   LAPAROSCOPY  . thyroid removed    . TOTAL THYROIDECTOMY  2006  . UMBILICAL HERNIA REPAIR  1982    There were no vitals filed for this visit.   Subjective Assessment - 10/03/20 1124    Subjective 8/10 pain today. Just feel stiff.    Currently in Pain? Yes    Pain Score 8     Pain Location Back    Pain Orientation Left;Lower;Posterior    Pain Descriptors / Indicators Sharp;Throbbing    Pain Type Chronic pain    Pain Onset More than a month ago    Multiple Pain Sites No                             [facility] Adult PT Treatment/Exercise - 10/03/20 0001      Lumbar Exercises: Aerobic   Nustep 5 min; bil UEs/LEs      Lumbar Exercises: Standing   Other Standing Lumbar Exercises standing trunk flexion holding onto free motion handle 3x30 sec followed by oblique/lat stretch 2x30 sec      Lumbar Exercises: Supine   Other Supine Lumbar Exercises tilt x 20; tilt with  ball squeeze x 20 with 5 sec hold; tilt with ball squeeze and bridge x 20; tilt with ball squeeze and bridge using peeling motion from gluteal up to pt tolerance;      Knee/Hip Exercises: Supine   Other Supine Knee/Hip Exercises butterfly stretch 2x30 sec; hamstring stretch bil 2x30 sec      Manual Therapy   Joint Mobilization . R UPA grade 3 mobs to T10-L3.                    PT Short Term Goals - 09/13/20 2326      PT SHORT TERM GOAL #1   Title Pt will be Ind in an initial HEP    Status Achieved    Target Date 09/12/20      PT SHORT TERM GOAL #2   Title Pt will voice understanding of measures to assist with the reduction of low back pain    Status Achieved    Target Date 09/12/20             PT Long Term Goals - 10/03/20 1125      PT LONG TERM GOAL #1   Title Pt's lumbar ext and L SB will improved to full ROM    Status On-going      PT LONG TERM GOAL #2   Title Pt will report a decrease in her low back to 6/10 or  less with daily activities to improve the function of her low back    Status On-going      PT LONG TERM GOAL #3   Title Pt will demonstrate proper body mechanics for household activities to assist in the reduction of low back strain and pain    Status On-going      PT LONG TERM GOAL #4   Title Pt will be Ind in a final HEP to maintain or progress achieved LOF    Status On-going                 Plan - 10/03/20 1135    Clinical Impression Statement Pt continues to report LBP with functional mobility and at rest. Pt reports manual therapy to assist with pain relief alleviation for up to 10 hours. Pt would benefit from continued PT for manual therapy, lumbopelvic strengthening and flexibility to improve pain and function.    Rehab Potential Good    PT Frequency 1x / week    PT Duration 6 weeks    PT Treatment/Interventions ADLs/Self Care Home Management;Cryotherapy;Electrical Stimulation;Ultrasound;Traction;Moist Heat;Iontophoresis 4mg /ml Dexamethasone;Functional mobility training;Therapeutic activities;Therapeutic exercise;Balance training;Patient/family education;Manual techniques;Dry needling;Passive range of motion;Taping    PT Next Visit Plan continue lumbar R UPA and L LE LAD mobs, lumbar/core strengthening, ERO    Consulted and Agree with Plan of Care Patient           Patient will benefit from skilled therapeutic intervention in order to improve the following deficits and impairments:  Difficulty walking,Decreased range of motion,Obesity,Decreased activity tolerance,Pain,Decreased balance,Decreased strength,Postural dysfunction  Visit Diagnosis: Chronic midline low back pain without sciatica  Decreased ROM of lumbar spine  Difficulty in walking, not elsewhere classified     Problem List Patient Active Problem List   Diagnosis Date Noted  . Overactive bladder 08/20/2020  . 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 Heme positive stool 07/15/2020  . Morbid obesity with BMI of  40.0-44.9, adult ([place]) 07/09/2020  . Bipolar 1 disorder, depressed, moderate ([place]) 02/10/2020  . Breast pain, left 06/11/2019  . Radiculopathy, cervical region 11/21/2018  .  Carpal tunnel syndrome, right upper limb 10/08/2018  . Carpal tunnel syndrome, left upper limb 10/08/2018  . Hypertension 09/30/2015  . Type 2 diabetes mellitus with hyperglycemia, with long-term current use of insulin ([place]) 09/30/2015  . S/P cholecystectomy 09/05/2013  . Borderline behavior 03/14/2012    Class: Chronic  . OCD (obsessive compulsive disorder) 03/14/2012    Class: Chronic  . PTSD (post-traumatic stress disorder) 03/14/2012    Class: Chronic  . 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 Lumbago without sciatica 03/12/2012  . Major depression, recurrent ([place]) 03/08/2012    Class: Chronic  . Snoring 12/02/2011  . PCOS (polycystic ovarian syndrome) 09/05/2011  . Hypothyroidism 08/13/2010  . Vitamin D deficiency 08/13/2010  . ANEMIA-NOS 08/13/2010    America Brown, PT, DPT 10/03/2020, 12:08 PM  [facility] [address] [place], [place], [address] Phone: [phone]   Fax:  [phone]  Name: Lynn Martinez MRN: [id number] Date of Birth: 08-29-1970

## 2020-10-10 ENCOUNTER — Other Ambulatory Visit: Payer: Self-pay

## 2020-10-10 ENCOUNTER — Ambulatory Visit: Payer: 59

## 2020-10-10 DIAGNOSIS — M545 Low back pain, unspecified: Secondary | ICD-10-CM

## 2020-10-10 DIAGNOSIS — G8929 Other chronic pain: Secondary | ICD-10-CM

## 2020-10-10 DIAGNOSIS — M5386 Other specified dorsopathies, lumbar region: Secondary | ICD-10-CM

## 2020-10-10 DIAGNOSIS — R262 Difficulty in walking, not elsewhere classified: Secondary | ICD-10-CM

## 2020-10-10 NOTE — Therapy (Signed)
[place], [place], [address] Phone: [phone]   Fax:  [phone]  Physical Therapy Treatment  Patient Details  Name: Nalani Andreen MRN: [id number] Date of Birth: 1971-04-30 Referring Provider (PT): Elsie Stain, MD   Encounter Date: 10/10/2020   PT End of Session - 10/10/20 2300    Visit Number 8    Number of Visits 13    Date for PT Re-Evaluation 12/05/20    Authorization Type [employer]    Authorization Time Period FOTO- 10th visit    PT Start Time 1030    PT Stop Time 1115    PT Time Calculation (min) 45 min    Activity Tolerance Patient tolerated treatment well;No increased pain    Behavior During Therapy WFL for tasks assessed/performed           Past Medical History:  Diagnosis Date  . Anemia   . Anxiety   . Arthritis    "knees" (09/30/2015)  . Bipolar disorder ([place])   . Depression   . Diabetes mellitus without complication ([place])   . Fibroid    s/p myomectomy 2010, hysterectomy 2013  . Grave's disease   . Heart murmur   . Hormone disorder   . Hypertension   . Hypothyroidism   . 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 Migraine     otc med prn  . PCOS (polycystic ovarian syndrome)   . PMS (premenstrual syndrome)   . PTSD (post-traumatic stress disorder)   . Seasonal allergies   . Thyroid cancer ([place]) 2005  . Thyroid disease   . Type II diabetes mellitus ([place])   . Vertigo     Past Surgical History:  Procedure Laterality Date  . ABDOMINAL HYSTERECTOMY  10/11/2011   Procedure: HYSTERECTOMY ABDOMINAL;  Surgeon: Terrance Mass, MD;  Location: [facility];  Service: Gynecology;  Laterality: N/A;  With Repair of serosa.  . ABDOMINAL HYSTERECTOMY    . CHOLECYSTECTOMY N/A 09/06/2013   Procedure: LAPAROSCOPIC CHOLECYSTECTOMY WITH INTRAOPERATIVE CHOLANGIOGRAM;  Surgeon: Merrie Roof, MD;  Location: [facility];  Service: General;  Laterality:  N/A;  . DIAGNOSTIC LAPAROSCOPY    . DILATION AND CURETTAGE OF UTERUS    . HERNIA REPAIR    . MYOMECTOMY  2010   LAPAROSCOPY  . thyroid removed    . TOTAL THYROIDECTOMY  2006  . UMBILICAL HERNIA REPAIR  1982    There were no vitals filed for this visit.   Subjective Assessment - 10/10/20 1037    Subjective Pt reports this is not a good day concerning pain. My neck, back and knees are hurting. Since the initiation of PT, pt reports 40% improvement with her pain and function.    Patient Stated Goals For my back pain to get better and to have better balance.    Currently in Pain? Yes    Pain Score 8     Pain Location Back    Pain Orientation Left;Posterior    Pain Descriptors / Indicators Sharp;Throbbing    Pain Type Chronic pain    Pain Onset More than a month ago    Pain Frequency Constant    Aggravating Factors  Prolonged activity: walking, standing, sitting    Pain Relieving Factors Resting              [facility] PT Assessment - 10/10/20 0001      AROM   Lumbar Flexion Full  Lumbar Extension Full    Lumbar - Right Side Bend Full    Lumbar - Left Side Bend Full    Lumbar - Right Rotation Full    Lumbar - Left Rotation Full                         [facility] Adult PT Treatment/Exercise - 10/10/20 0001      Exercises   Exercises Lumbar;Knee/Hip      Lumbar Exercises: Stretches   Lower Trunk Rotation 5 reps;10 seconds    Other Lumbar Stretch Exercise Seated forward trunk flexion, 10 sec each      Lumbar Exercises: Standing   Row Both;15 reps    Theraband Level (Row) Level 3 (Green)    Shoulder Extension Both;15 reps    Theraband Level (Shoulder Extension) Level 3 (Green)      Lumbar Exercises: Supine   Pelvic Tilt 10 reps   3 sec   Dead Bug 5 reps   10 sec   Dead Bug Limitations abdominal bracing    Other Supine Lumbar Exercises hip add sets c ball squeeze; 15x; 3 sec    Other Supine Lumbar Exercises Clam c green Tband; 15x                   PT Education - 10/10/20 2315    Education Details Updated HEP    Person(s) Educated Patient    Methods Explanation    Comprehension Verbalized understanding;Returned demonstration;Verbal cues required;Tactile cues required            PT Short Term Goals - 09/13/20 2326      PT SHORT TERM GOAL #1   Title Pt will be Ind in an initial HEP    Status Achieved    Target Date 09/12/20      PT SHORT TERM GOAL #2   Title Pt will voice understanding of measures to assist with the reduction of low back pain    Status Achieved    Target Date 09/12/20             PT Long Term Goals - 10/10/20 2308      PT LONG TERM GOAL #1   Title Pt's lumbar ext and L SB will improved to full ROM. 10/10/20: Pt demostrates lumbar ROM WNLs.    Baseline 25% limited for both movements    Status Achieved    Target Date 10/10/20      PT LONG TERM GOAL #2   Title Pt will report a decrease in her low back to 6/10 or less with daily activities to improve the function of her low back    Baseline 5-10/10    Status On-going    Target Date 12/05/20      PT LONG TERM GOAL #3   Title Pt will demonstrate proper body mechanics for household activities to assist in the reduction of low back strain and pain    Status On-going    Target Date 12/05/20      PT LONG TERM GOAL #4   Title Pt will be Ind in a final HEP to maintain or progress achieved LOF    Status On-going    Target Date 12/05/20                 Plan - 10/10/20 2303    Clinical Impression Statement Pt is making progress in PT re: improved trunk ROM and improved ease of mobility. Trunk ROMs are WNLs.  Additionally, pt reports her pain has improved minimally. Recommend the continuation of PT 1w6 to address lumbopelvic strengthening and stability to optimize pt's function. In addition to PT in the clinic, pt will also benefit from the aquatics program at [facility] for a supportive medium for the pt to exercise in.    Personal  Factors and Comorbidities Time since onset of injury/illness/exacerbation;Comorbidity 1    Comorbidities Obese    Examination-Activity Limitations Stand;Locomotion Level    Stability/Clinical Decision Making Stable/Uncomplicated    Clinical Decision Making Low    Rehab Potential Good    PT Frequency 1x / week    PT Duration 6 weeks    PT Treatment/Interventions ADLs/Self Care Home Management;Cryotherapy;Electrical Stimulation;Ultrasound;Traction;Moist Heat;Iontophoresis 4mg /ml Dexamethasone;Functional mobility training;Therapeutic activities;Therapeutic exercise;Balance training;Patient/family education;Manual techniques;Dry needling;Passive range of motion;Taping;Aquatic Therapy    PT Next Visit Plan continue lumbar R UPA and L LE LAD mobs, lumbar/core strengthening, ERO    PT Home Exercise Plan [id number]-Posterior chain strengthening-sh extension and rows c green Tband    Consulted and Agree with Plan of Care Patient           Patient will benefit from skilled therapeutic intervention in order to improve the following deficits and impairments:  Difficulty walking,Decreased range of motion,Obesity,Decreased activity tolerance,Pain,Decreased balance,Decreased strength,Postural dysfunction  Visit Diagnosis: Chronic midline low back pain without sciatica  Decreased ROM of lumbar spine  Difficulty in walking, not elsewhere classified     Problem List Patient Active Problem List   Diagnosis Date Noted  . Overactive bladder 08/20/2020  . 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 Heme positive stool 07/15/2020  . Morbid obesity with BMI of 40.0-44.9, adult ([place]) 07/09/2020  . Bipolar 1 disorder, depressed, moderate ([place]) 02/10/2020  . Breast pain, left 06/11/2019  . Radiculopathy, cervical region 11/21/2018  . Carpal tunnel syndrome, right upper limb 10/08/2018  . Carpal tunnel syndrome, left upper limb 10/08/2018  . Hypertension 09/30/2015  . Type 2 diabetes mellitus with hyperglycemia, with  long-term current use of insulin ([place]) 09/30/2015  . S/P cholecystectomy 09/05/2013  . Borderline behavior 03/14/2012    Class: Chronic  . OCD (obsessive compulsive disorder) 03/14/2012    Class: Chronic  . PTSD (post-traumatic stress disorder) 03/14/2012    Class: Chronic  . 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 Lumbago without sciatica 03/12/2012  . Major depression, recurrent ([place]) 03/08/2012    Class: Chronic  . Snoring 12/02/2011  . PCOS (polycystic ovarian syndrome) 09/05/2011  . Hypothyroidism 08/13/2010  . Vitamin D deficiency 08/13/2010  . ANEMIA-NOS 08/13/2010    Gar Ponto MS, PT 10/10/20 11:57 PM  [place] [facility] [address] [place], [place], [address] Phone: [phone]   Fax:  [phone]  Name: Jameah Rouser MRN: [id number] Date of Birth: 10-17-1970

## 2020-10-14 ENCOUNTER — Ambulatory Visit: Payer: No Typology Code available for payment source | Admitting: Critical Care Medicine

## 2020-10-15 ENCOUNTER — Other Ambulatory Visit: Payer: Self-pay

## 2020-10-17 ENCOUNTER — Ambulatory Visit: Payer: 59

## 2020-10-19 ENCOUNTER — Ambulatory Visit (INDEPENDENT_AMBULATORY_CARE_PROVIDER_SITE_OTHER): Payer: No Payment, Other | Admitting: Psychiatry

## 2020-10-19 ENCOUNTER — Other Ambulatory Visit: Payer: Self-pay

## 2020-10-19 ENCOUNTER — Encounter (HOSPITAL_COMMUNITY): Payer: Self-pay | Admitting: Psychiatry

## 2020-10-19 VITALS — BP 159/83 | HR 90 | Ht 70.0 in | Wt 302.0 lb

## 2020-10-19 DIAGNOSIS — F3176 Bipolar disorder, in full remission, most recent episode depressed: Secondary | ICD-10-CM | POA: Insufficient documentation

## 2020-10-19 MED ORDER — LURASIDONE HCL 80 MG PO TABS
ORAL_TABLET | Freq: Every day | ORAL | 1 refills | Status: DC
  Filled 2020-10-19: qty 30, 30d supply, fill #0
  Filled 2020-11-20: qty 30, 30d supply, fill #1

## 2020-10-19 MED ORDER — LITHIUM CARBONATE ER 450 MG PO TBCR
EXTENDED_RELEASE_TABLET | Freq: Every day | ORAL | 1 refills | Status: DC
  Filled 2020-10-19: qty 60, 30d supply, fill #0
  Filled 2020-11-20 – 2020-12-02 (×2): qty 60, 30d supply, fill #1

## 2020-10-19 MED ORDER — HYDROXYZINE HCL 25 MG PO TABS
ORAL_TABLET | Freq: Two times a day (BID) | ORAL | 1 refills | Status: DC
  Filled 2020-10-19: qty 60, 30d supply, fill #0
  Filled 2020-11-20: qty 60, 30d supply, fill #1

## 2020-10-19 MED ORDER — PRAZOSIN HCL 1 MG PO CAPS
ORAL_CAPSULE | ORAL | 1 refills | Status: DC
  Filled 2020-10-19: qty 30, 30d supply, fill #0
  Filled 2020-11-20: qty 30, 30d supply, fill #1

## 2020-10-19 MED ORDER — CITALOPRAM HYDROBROMIDE 40 MG PO TABS
40.0000 mg | ORAL_TABLET | Freq: Every day | ORAL | 1 refills | Status: DC
  Filled 2020-10-19: qty 30, 30d supply, fill #0
  Filled 2020-11-20: qty 30, 30d supply, fill #1

## 2020-10-19 NOTE — Progress Notes (Signed)
[facility] Progress Note    Patient Identification: Lynn Martinez MRN:  [id number] Date of Evaluation:  10/19/2020    Chief Complaint:   " I feel overwhelmed."  Visit Diagnosis:    ICD-10-CM   1. Bipolar 1 disorder, depressed, full remission ([place])  F31.76     History of Present Illness: Patient reported that she feels overwhelmed because of everything that she has to go through.  She stated that her biggest stressor is the fact that she is still in full-time school along with a full-time job.  She is supposed to be taking her finals in September and then will get her degree.  She is looking forward to getting her bachelor's degree because that will open more opportunities for her professionally. In the meantime she feels she stuck at this job where a lot of the things that are done are not the way they should be done.  She stated that she was in a different area of work in the past and dealing with the people at this present job is somewhat tricky. She feels things can be done differently but at the same time she still has interest in going to work. She stated that the other biggest stressor is the fact that her husband continues to be addicted to crack cocaine and is still not taking any responsibility for the household.  She feels like the only breadwinner who is responsible for everything. She stated that her husband also has severe form of bipolar disorder and PTSD.  He is a retired English as a second language teacher.  He continues to self medicate himself with crack cocaine and spends a lot of money on that.  She informed that he is already connected with ACT team and despite that he is not doing well.  She stated that she feels irritable and depressed on some days and also feels very anxious.  She asked if any of her medications can be adjusted.  She was agreeable to adjusting the dose of Celexa to a higher dose for optimal effects.  She stated that once she is done with her classes in September she will be  able to breathe and will look for other opportunities where she can make more money. She continues to be engaged with her stepchildren's children.  She stated that they treated her very well on Mother's Day despite her not being their biological mother.    Past Psychiatric History: Bipolar d/o, anxiety   Past Medical History:  Past Medical History:  Diagnosis Date  . Anemia   . Anxiety   . Arthritis    "knees" (09/30/2015)  . Bipolar disorder ([place])   . Depression   . Diabetes mellitus without complication ([place])   . Fibroid    s/p myomectomy 2010, hysterectomy 2013  . Grave's disease   . Heart murmur   . Hormone disorder   . Hypertension   . Hypothyroidism   . 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 Migraine     otc med prn  . PCOS (polycystic ovarian syndrome)   . PMS (premenstrual syndrome)   . PTSD (post-traumatic stress disorder)   . Seasonal allergies   . Thyroid cancer ([place]) 2005  . Thyroid disease   . Type II diabetes mellitus ([place])   . Vertigo     Past Surgical History:  Procedure Laterality Date  . ABDOMINAL HYSTERECTOMY  10/11/2011   Procedure: HYSTERECTOMY ABDOMINAL;  Surgeon: Terrance Mass, MD;  Location:  [facility];  Service: Gynecology;  Laterality: N/A;  With Repair of serosa.  . ABDOMINAL HYSTERECTOMY    . CHOLECYSTECTOMY N/A 09/06/2013   Procedure: LAPAROSCOPIC CHOLECYSTECTOMY WITH INTRAOPERATIVE CHOLANGIOGRAM;  Surgeon: Merrie Roof, MD;  Location: [facility];  Service: General;  Laterality: N/A;  . DIAGNOSTIC LAPAROSCOPY    . DILATION AND CURETTAGE OF UTERUS    . HERNIA REPAIR    . MYOMECTOMY  2010   LAPAROSCOPY  . thyroid removed    . TOTAL THYROIDECTOMY  2006  . UMBILICAL HERNIA REPAIR  1982    Family Psychiatric History: denied  Family History:  Family History  Problem Relation Age of Onset  . Thyroid disease Mother   . Diabetes Father   . Hypertension Father   . Heart disease Father   . Heart attack Father   .  Cancer Maternal Aunt        BRAIN  . Cancer Maternal Grandmother        LYMPHOMA  . Hypertension Maternal Grandmother   . Cancer Paternal Grandmother        PANCREATIC  . Diabetes Paternal Grandmother   . Hypertension Paternal Grandmother   . Hypertension Maternal Grandfather   . Diabetes Maternal Grandfather   . Hypertension Paternal Grandfather   . Pancreatic cancer Paternal Grandfather     Social History:   Social History   Socioeconomic History  . Marital status: Single    Spouse name: Not on file  . Number of children: Not on file  . Years of education: Not on file  . Highest education level: 12th grade  Occupational History  . Not on file  Tobacco Use  . Smoking status: Never Smoker  . Smokeless tobacco: Never Used  Vaping Use  . Vaping Use: Never used  Substance and Sexual Activity  . Alcohol use: Never  . Drug use: Never  . Sexual activity: Yes    Birth control/protection: Surgical  Other Topics Concern  . Not on file  Social History Narrative   ** Merged History Encounter **       Social Determinants of Health   Financial Resource Strain: Not on file  Food Insecurity: Food Insecurity Present  . Worried About Charity fundraiser in the Last Year: Sometimes true  . Ran Out of Food in the Last Year: Sometimes true  Transportation Needs: No Transportation Needs  . Lack of Transportation (Medical): No  . Lack of Transportation (Non-Medical): No  Physical Activity: Not on file  Stress: Not on file  Social Connections: Not on file    Additional Social History: Lives with husband. Works at [employer] center. Husband is a retired English as a second language teacher. Has no children.  Allergies:   Allergies  Allergen Reactions  . Bee Venom   . Codeine Itching    Tolerates Hydrocodone OK.  . Ibuprofen Hives and Other (See Comments)    Happened in childhood  . Ibuprofen Hives and Swelling  . Metformin And Related Other (See Comments)    Muscle weakness  . Morphine  Itching and Other (See Comments)    Throat swelling  . Penicillins     Childhood Allergy Has patient had a PCN reaction causing immediate rash, facial/tongue/throat swelling, SOB or lightheadedness with hypotension: Unknown Has patient had a PCN reaction causing severe rash involving mucus membranes or skin necrosis: Unknown Has patient had a PCN reaction that required hospitalization: Unknown Has patient had a PCN reaction occurring within the last 10 years: Unknown If  all of the above answers are "NO", then may proceed with Cephalosporin use.   . Aspirin Hives, Swelling and Rash    Metabolic Disorder Labs: Lab Results  Component Value Date   HGBA1C 13.7 (A) 07/09/2020   MPG 214 09/30/2015   MPG 123 (H) 03/08/2012   No results found for: PROLACTIN Lab Results  Component Value Date   CHOL 172 07/09/2020   TRIG 117 07/09/2020   HDL 58 07/09/2020   CHOLHDL 3.0 07/09/2020   VLDL 22 09/30/2015   LDLCALC 93 07/09/2020   LDLCALC 86 03/18/2020   Lab Results  Component Value Date   TSH 12.52 (H) 09/02/2020    Therapeutic Level Labs: Lab Results  Component Value Date   LITHIUM 0.6 07/09/2020   No results found for: CBMZ No results found for: VALPROATE  Current Medications: Current Outpatient Medications  Medication Sig Dispense Refill  . atorvastatin (LIPITOR) 10 MG tablet TAKE 1 TABLET (10 MG TOTAL) BY MOUTH DAILY. TO LOWER CHOLESTEROL 90 tablet 3  . citalopram (CELEXA) 20 MG tablet TAKE 1 TABLET (20 MG TOTAL) BY MOUTH DAILY. 30 tablet 2  . clindamycin-benzoyl peroxide (BENZACLIN) gel APPLY TOPICALLY 2 (TWO) TIMES DAILY. TO UNDERARMS, GROIN AREAS 25 g 1  . Continuous Blood Gluc Sensor (DEXCOM G6 SENSOR) MISC 1 Device by Does not apply route as directed. 9 each 1  . Continuous Blood Gluc Transmit (DEXCOM G6 TRANSMITTER) MISC 1 Device by Does not apply route as directed. 1 each 3  . diclofenac Sodium (VOLTAREN) 1 % GEL Apply topically.    . gabapentin (NEURONTIN) 300 MG  capsule TAKE 2 CAPSULES (600 MG TOTAL) BY MOUTH DAILY. 60 capsule 2  . hydrOXYzine (ATARAX/VISTARIL) 25 MG tablet TAKE 1 TABLET (25 MG TOTAL) BY MOUTH 2 (TWO) TIMES DAILY. 60 tablet 2  . Insulin Disposable Pump (OMNIPOD DASH 5 PACK PODS) MISC USE 1 DEVICE AS DIRECTED. 9 each 3  . insulin lispro (HUMALOG) 100 UNIT/ML injection MAX DAILY 100 UNITS PER PUMP USE 90 mL 3  . Insulin Pen Needle 32G X 4 MM MISC USE AS INSTRUCTED TO INJECT INSULIN. 100 each 11  . levothyroxine (SYNTHROID) 137 MCG tablet TAKE 1 CAPSULE (137 MCG TOTAL) BY MOUTH DAILY BEFORE BREAKFAST. 90 tablet 3  . Levothyroxine Sodium 137 MCG CAPS Take 1 capsule (137 mcg total) by mouth daily before breakfast. 90 capsule 3  . lithium carbonate (ESKALITH) 450 MG CR tablet TAKE 2 CAPSULES AT BEDTIME 60 tablet 2  . lurasidone (LATUDA) 80 MG TABS tablet TAKE 1 TABLET (80 MG TOTAL) BY MOUTH AT BEDTIME. 30 tablet 2  . methocarbamol (ROBAXIN) 500 MG tablet TAKE 1 TABLET (500 MG TOTAL) BY MOUTH 3 (THREE) TIMES DAILY AS NEEDED FOR MUSCLE SPASMS. 90 tablet 0  . omeprazole (PRILOSEC) 40 MG capsule TAKE 1 CAPSULE (40 MG TOTAL) BY MOUTH 2 (TWO) TIMES DAILY. 60 capsule 3  . pioglitazone (ACTOS) 30 MG tablet TAKE 1 TABLET (30 MG TOTAL) BY MOUTH DAILY. 30 tablet 2  . prazosin (MINIPRESS) 1 MG capsule TAKE 1 CAPSULE (1 MG TOTAL) BY MOUTH AT BEDTIME. FOR SLEEP/NIGHTMARES 30 capsule 2  . propranolol (INDERAL) 40 MG tablet TAKE 1 TABLET (40 MG TOTAL) BY MOUTH 2 (TWO) TIMES DAILY. 60 tablet 3  . Simethicone 125 MG CAPS Take twice daily 28 capsule 0  . tolterodine (DETROL LA) 4 MG 24 hr capsule TAKE 1 CAPSULE (4 MG TOTAL) BY MOUTH DAILY. 30 capsule 3  . Vitamin D, Ergocalciferol, (DRISDOL) 1.25 MG (  50000 UNIT) CAPS capsule TAKE 1 CAPSULE (50,000 UNITS TOTAL) BY MOUTH EVERY 7 (SEVEN) DAYS. 14 capsule 1   No current facility-administered medications for this visit.     Psychiatric Specialty Exam: Review of Systems  Blood pressure (!) 159/83, pulse 90, height  5\' 10"  (1.778 m), weight (!) 302 lb (137 kg), last menstrual period 09/20/2011, SpO2 99 %.Body mass index is 43.33 kg/m.  General Appearance: Fairly Groomed  Eye Contact:  Good  Speech:  Clear and Coherent and Normal Rate  Volume:  Normal  Mood:  Anxious  Affect:  Congruent  Thought Process:  Goal Directed and Descriptions of Associations: Intact  Orientation:  Full (Time, Place, and Person)  Thought Content:  Logical  Suicidal Thoughts:  No  Homicidal Thoughts:  No  Memory:  Immediate;   Good Recent;   Good  Judgement:  Fair  Insight:  Fair  Psychomotor Activity:  Normal  Concentration:  Concentration: Good and Attention Span: Good  Recall:  Good  Fund of Knowledge:Good  Language: Good  Akathisia:  Negative  Handed:  Right  AIMS (if indicated):  Not done  Assets:  Communication Skills Desire for Improvement Financial Resources/Insurance Housing  ADL's:  Intact  Cognition: WNL  Sleep:  Fair   Screenings: GAD-7   Personnel officer Visit from 07/09/2020 in [facility] Office Visit from 03/18/2020 in [facility] Office Visit from 02/28/2019 in [facility] Office Visit from 10/03/2018 in [facility] Office Visit from 07/04/2018 in [facility]  Total GAD-7 Score 14 14 12 15 14     PHQ2-9   Flowsheet Row Nutrition [facility]2020 [facility] Visit from 07/09/2020 in [facility] Office Visit from 03/18/2020 in [facility] Office Visit from 07/03/2019 in [facility] Office Visit from 02/28/2019 in [facility]  PHQ-2 Total Score 0 4 4 2 4   PHQ-9 Total Score -- 14 14 9 14       Assessment and Plan: Patient reported feeling overwhelmed due to several different stressors.  Her last blood  work was done back in January including lithium levels.  We will continue same regimen for now except for increasing the dose of Celexa to 40 mg for optimal control of symptoms.  1. Bipolar 1 disorder, depressed, moderate ([facility])  - hydrOXYzine (ATARAX/VISTARIL) 25 MG tablet; Take 1 tablet (25 mg total) by mouth 2 (two) times daily.  Dispense: 60 tablet; Refill: 2 - lurasidone (LATUDA) 80 MG TABS tablet; Take 1 tablet (80 mg total) by mouth at bedtime.  Dispense: 30 tablet; Refill: 2 - lithium carbonate (ESKALITH) 450 MG CR tablet; Take 2 capsules at bedtime  Dispense: 60 tablet; Refill: 2 - prazosin (MINIPRESS) 1 MG capsule; Take 1 capsule (1 mg total) by mouth at bedtime. For sleep/nightmares  Dispense: 30 capsule; Refill: 2 -Increase citalopram (CELEXA) 40 MG tablet; Take 1 tablet (40 mg total) by mouth daily.  Dispense: 30 tablet; Refill: 2   Continue same medication regimen. Follow up in 3 months.     Nevada Crane, MD 5/9/20223:12 PM

## 2020-10-20 ENCOUNTER — Other Ambulatory Visit: Payer: Self-pay

## 2020-10-20 MED FILL — Propranolol HCl Tab 40 MG: ORAL | 30 days supply | Qty: 60 | Fill #1 | Status: AC

## 2020-10-20 MED FILL — Atorvastatin Calcium Tab 10 MG (Base Equivalent): ORAL | 30 days supply | Qty: 30 | Fill #1 | Status: AC

## 2020-10-20 MED FILL — Tolterodine Tartrate Cap ER 24HR 4 MG: ORAL | 30 days supply | Qty: 30 | Fill #0 | Status: AC

## 2020-10-20 MED FILL — Clindamycin Phosphate-Benzoyl Peroxide Gel 1-5%: CUTANEOUS | 12 days supply | Qty: 25 | Fill #0 | Status: AC

## 2020-10-21 ENCOUNTER — Other Ambulatory Visit: Payer: Self-pay

## 2020-10-21 MED FILL — Omeprazole Cap Delayed Release 40 MG: ORAL | 30 days supply | Qty: 60 | Fill #1 | Status: AC

## 2020-10-21 MED FILL — Levothyroxine Sodium Tab 137 MCG: ORAL | 30 days supply | Qty: 30 | Fill #1 | Status: AC

## 2020-10-21 MED FILL — Pioglitazone HCl Tab 30 MG (Base Equiv): ORAL | 30 days supply | Qty: 30 | Fill #1 | Status: AC

## 2020-10-22 ENCOUNTER — Ambulatory Visit: Payer: 59 | Admitting: Dietician

## 2020-10-24 ENCOUNTER — Ambulatory Visit: Payer: 59 | Admitting: Physical Therapy

## 2020-11-07 ENCOUNTER — Ambulatory Visit: Payer: 59 | Admitting: Rehabilitative and Restorative Service Providers"

## 2020-11-13 ENCOUNTER — Ambulatory Visit: Payer: No Typology Code available for payment source | Admitting: Internal Medicine

## 2020-11-14 ENCOUNTER — Ambulatory Visit: Payer: 59 | Attending: Critical Care Medicine | Admitting: Rehabilitative and Restorative Service Providers"

## 2020-11-14 ENCOUNTER — Encounter: Payer: Self-pay | Admitting: Rehabilitative and Restorative Service Providers"

## 2020-11-14 ENCOUNTER — Other Ambulatory Visit: Payer: Self-pay

## 2020-11-14 DIAGNOSIS — G8929 Other chronic pain: Secondary | ICD-10-CM | POA: Diagnosis present

## 2020-11-14 DIAGNOSIS — R262 Difficulty in walking, not elsewhere classified: Secondary | ICD-10-CM | POA: Diagnosis present

## 2020-11-14 DIAGNOSIS — M5386 Other specified dorsopathies, lumbar region: Secondary | ICD-10-CM | POA: Diagnosis not present

## 2020-11-14 DIAGNOSIS — M545 Low back pain, unspecified: Secondary | ICD-10-CM | POA: Insufficient documentation

## 2020-11-14 NOTE — Therapy (Signed)
[facility] [place], [place], [address] Phone: [phone]   Fax:  [phone]  Physical Therapy Treatment/Discharge Patient Details  Name: Lynn Martinez MRN: [id number] Date of Birth: 1971-04-12 Referring Provider (PT): Elsie Stain, MD   Encounter Date: 11/14/2020   PT End of Session - 11/14/20 1056    Visit Number 9    Authorization Type [employer]    PT Start Time 3557    PT Stop Time 1119    PT Time Calculation (min) 45 min    Activity Tolerance Patient tolerated treatment well;No increased pain    Behavior During Therapy WFL for tasks assessed/performed           Past Medical History:  Diagnosis Date  . Anemia   . Anxiety   . Arthritis    "knees" (09/30/2015)  . Bipolar disorder ([place])   . Depression   . Diabetes mellitus without complication ([place])   . Fibroid    s/p myomectomy 2010, hysterectomy 2013  . Grave's disease   . Heart murmur   . Hormone disorder   . Hypertension   . Hypothyroidism   . 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 Migraine     otc med prn  . PCOS (polycystic ovarian syndrome)   . PMS (premenstrual syndrome)   . PTSD (post-traumatic stress disorder)   . Seasonal allergies   . Thyroid cancer ([place]) 2005  . Thyroid disease   . Type II diabetes mellitus ([place])   . Vertigo     Past Surgical History:  Procedure Laterality Date  . ABDOMINAL HYSTERECTOMY  10/11/2011   Procedure: HYSTERECTOMY ABDOMINAL;  Surgeon: Terrance Mass, MD;  Location: [facility];  Service: Gynecology;  Laterality: N/A;  With Repair of serosa.  . ABDOMINAL HYSTERECTOMY    . CHOLECYSTECTOMY N/A 09/06/2013   Procedure: LAPAROSCOPIC CHOLECYSTECTOMY WITH INTRAOPERATIVE CHOLANGIOGRAM;  Surgeon: Merrie Roof, MD;  Location: [facility];  Service: General;  Laterality: N/A;  . DIAGNOSTIC LAPAROSCOPY    . DILATION AND CURETTAGE OF UTERUS    . HERNIA REPAIR    .  MYOMECTOMY  2010   LAPAROSCOPY  . thyroid removed    . TOTAL THYROIDECTOMY  2006  . UMBILICAL HERNIA REPAIR  1982    There were no vitals filed for this visit.   Subjective Assessment - 11/14/20 1033    Subjective I haven't been doing bad. I have been doing good. My pain has been really good.    Currently in Pain? Yes    Pain Score 4     Pain Location Back    Pain Orientation Right    Pain Descriptors / Indicators Tightness;Dull    Pain Type Chronic pain    Pain Onset More than a month ago    Pain Frequency Intermittent    Aggravating Factors  laying supine at night    Multiple Pain Sites No                             [facility] Adult PT Treatment/Exercise - 11/14/20 0001      Therapeutic Activites    Therapeutic Activities --   reviewed body mechanics, work station setup     Lumbar Exercises: Supine   Other Supine Lumbar Exercises tilt  x 20; tilt with march x 20, tilt with SLR x 20; tilt with opposite UE/LEs lifts x  20; long axis distraction x 10 with 2-3 sec hold each LE                    PT Short Term Goals - 09/13/20 2326      PT SHORT TERM GOAL #1   Title Pt will be Ind in an initial HEP    Status Achieved    Target Date 09/12/20      PT SHORT TERM GOAL #2   Title Pt will voice understanding of measures to assist with the reduction of low back pain    Status Achieved    Target Date 09/12/20             PT Long Term Goals - 11/14/20 1041      PT LONG TERM GOAL #1   Title Pt's lumbar ext and L SB will improved to full ROM. 10/10/20: Pt demostrates lumbar ROM WNLs.    Status Achieved      PT LONG TERM GOAL #2   Title Pt will report a decrease in her low back to 6/10 or less with daily activities to improve the function of her low back    Status Achieved      PT LONG TERM GOAL #3   Title Pt will demonstrate proper body mechanics for household activities to assist in the reduction of low back strain and pain    Baseline reviewed  and body mechanics handout issued    Status Achieved      PT LONG TERM GOAL #4   Title Pt will be Ind in a final HEP to maintain or progress achieved LOF    Status Achieved    Target Date 12/05/20                 Plan - 11/14/20 1057    Clinical Impression Statement Pt presents to PT after having not been to therapy in 1 month with reduced pain. PT reviewed body mechanics handouts and work station setup. discussed gym options for aquatic exercise. Pt to [employer]; handout given for [facility] financial assistance. Discussed [facility] 12 year contract and fee; pt prefers [facility]. DC due to pt able to manage her pain at home; happy with progress. Lumbar AROM WNL.    PT Next Visit Plan Pt DC this visit to HEP.    Consulted and Agree with Plan of Care Patient           Patient will benefit from skilled therapeutic intervention in order to improve the following deficits and impairments:     Visit Diagnosis: Decreased ROM of lumbar spine  Difficulty in walking, not elsewhere classified  Chronic midline low back pain without sciatica     Problem List Patient Active Problem List   Diagnosis Date Noted  . Bipolar 1 disorder, depressed, full remission ([place]) 10/19/2020  . Overactive bladder 08/20/2020  . 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 Heme positive stool 07/15/2020  . Morbid obesity with BMI of 40.0-44.9, adult ([place]) 07/09/2020  . Bipolar 1 disorder, depressed, moderate ([place]) 02/10/2020  . Breast pain, left 06/11/2019  . Radiculopathy, cervical region 11/21/2018  . Carpal tunnel syndrome, right upper limb 10/08/2018  . Carpal tunnel syndrome, left upper limb 10/08/2018  . Hypertension 09/30/2015  . Type 2 diabetes mellitus with hyperglycemia, with long-term current use of insulin ([place]) 09/30/2015  . S/P cholecystectomy 09/05/2013  . Borderline behavior 03/14/2012    Class: Chronic  . OCD (obsessive compulsive disorder) 03/14/2012    Class: Chronic  .  PTSD (post-traumatic stress  disorder) 03/14/2012    Class: Chronic  . 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 Lumbago without sciatica 03/12/2012  . Major depression, recurrent ([place]) 03/08/2012    Class: Chronic  . Snoring 12/02/2011  . PCOS (polycystic ovarian syndrome) 09/05/2011  . Hypothyroidism 08/13/2010  . Vitamin D deficiency 08/13/2010  . ANEMIA-NOS 08/13/2010    America Brown, PT, DPT 11/14/2020, 12:16 PM  [facility] [address] [place], [place], [address] Phone: [phone]   Fax:  [phone]  Name: Lynn Martinez MRN: [id number] Date of Birth: 08/22/1970   PHYSICAL THERAPY DISCHARGE SUMMARY  Visits from Start of Care: 9  Current functional level related to goals / functional outcomes: All goals have been met. Pt agreeable to discharge. Pt to research [employer] program. Pt indep with HEP   Remaining deficits: Pain managed by HEP; pt reports increase in functional mobility   Education / Equipment: HEP  Plan: Patient agrees to discharge.  Patient goals were met. Patient is being discharged due to meeting the stated rehab goals.  ?????     America Brown, PT, DPT

## 2020-11-14 NOTE — Patient Instructions (Signed)

## 2020-11-20 ENCOUNTER — Other Ambulatory Visit: Payer: Self-pay

## 2020-11-20 MED FILL — Propranolol HCl Tab 40 MG: ORAL | 30 days supply | Qty: 60 | Fill #2 | Status: AC

## 2020-11-20 MED FILL — Omeprazole Cap Delayed Release 40 MG: ORAL | 30 days supply | Qty: 60 | Fill #2 | Status: AC

## 2020-11-20 MED FILL — Atorvastatin Calcium Tab 10 MG (Base Equivalent): ORAL | 30 days supply | Qty: 30 | Fill #2 | Status: AC

## 2020-11-20 MED FILL — Levothyroxine Sodium Tab 137 MCG: ORAL | 30 days supply | Qty: 30 | Fill #2 | Status: AC

## 2020-11-20 MED FILL — Pioglitazone HCl Tab 30 MG (Base Equiv): ORAL | 30 days supply | Qty: 30 | Fill #2 | Status: AC

## 2020-11-20 MED FILL — Gabapentin Cap 300 MG: ORAL | 30 days supply | Qty: 60 | Fill #1 | Status: AC

## 2020-11-20 MED FILL — Tolterodine Tartrate Cap ER 24HR 4 MG: ORAL | 30 days supply | Qty: 30 | Fill #1 | Status: CN

## 2020-11-23 ENCOUNTER — Other Ambulatory Visit: Payer: Self-pay

## 2020-11-24 ENCOUNTER — Other Ambulatory Visit: Payer: Self-pay

## 2020-11-24 ENCOUNTER — Encounter: Payer: Self-pay | Admitting: Gastroenterology

## 2020-11-24 ENCOUNTER — Ambulatory Visit (AMBULATORY_SURGERY_CENTER): Payer: 59 | Admitting: Gastroenterology

## 2020-11-24 VITALS — BP 143/76 | HR 58 | Temp 98.4°F | Resp 11 | Ht 70.0 in | Wt 302.0 lb

## 2020-11-24 DIAGNOSIS — R195 Other fecal abnormalities: Secondary | ICD-10-CM | POA: Diagnosis not present

## 2020-11-24 MED ORDER — SODIUM CHLORIDE 0.9 % IV SOLN: 500.0000 mL | Freq: Once | INTRAVENOUS | Status: DC

## 2020-11-24 NOTE — Progress Notes (Signed)
To PACU, VSS. Report to [email or web address] 

## 2020-11-24 NOTE — Progress Notes (Signed)
Vitals-CW  Pt's states no medical or surgical changes since previsit or office visit. 

## 2020-11-24 NOTE — Op Note (Signed)
[place] Patient Name: Lynn Martinez Procedure Date: 11/24/2020 9:45 AM MRN: [id number] Endoscopist: Thornton Park MD, MD Age: 50 Referring MD:  Date of Birth: [birth year] Gender: Female Account #: [id number] Procedure:                Colonoscopy Indications:              Occult blood noted in the stool                           Recent blood and mucus in the rectum                           Longstanding history of alternating diarrhea and                            constipation                           No prior colon cancer screening                           No GI malignancies in first-degree relatives                           Maternal grandmother with lymphoma and colon cancer Medicines:                Monitored Anesthesia Care Procedure:                Pre-Anesthesia Assessment:                           - Prior to the procedure, a History and Physical                            was performed, and patient medications and                            allergies were reviewed. The patient's tolerance of                            previous anesthesia was also reviewed. The risks                            and benefits of the procedure and the sedation                            options and risks were discussed with the patient.                            All questions were answered, and informed consent                            was obtained. Prior Anticoagulants: The patient has                            taken no previous  anticoagulant or antiplatelet                            agents. ASA Grade Assessment: III - A patient with                            severe systemic disease. After reviewing the risks                            and benefits, the patient was deemed in                            satisfactory condition to undergo the procedure.                           After obtaining informed consent, the colonoscope                            was passed under  direct vision. Throughout the                            procedure, the patient's blood pressure, pulse, and                            oxygen saturations were monitored continuously. The                            Olympus [id number]-[id number] (#[id number]) Colonoscope was                            introduced through the anus and advanced to the the                            cecum, identified by appendiceal orifice and                            ileocecal valve. The colonoscopy was technically                            difficult and complex due to inadequate bowel prep.                            Despite repeated attempts at suction and lavage,                            there was stool particles large enough to                            repeatedly clog the scope. The patient tolerated                            the procedure well. The quality of the bowel  preparation was inadequate. The ileocecal valve,                            appendiceal orifice, and rectum were photographed. Scope In: 10:02:37 AM Scope Out: 10:17:51 AM Scope Withdrawal Time: 0 hours 8 minutes 54 seconds  Total Procedure Duration: 0 hours 15 minutes 14 seconds  Findings:                 The perianal and digital rectal examinations were                            normal.                           Non-bleeding internal hemorrhoids were found.                           Stool was found in the entire colon, interfering                            with visualization.                           The exam was otherwise without abnormality on                            direct and retroflexion views. Complications:            No immediate complications. Estimated blood loss:                            Minimal. Estimated Blood Loss:     Estimated blood loss was minimal. Impression:               - Non-bleeding internal hemorrhoids.                           - Stool in the entire examined colon limiting any                             meaningful evaluation. Recommendation:           - Patient has a contact number available for                            emergencies. The signs and symptoms of potential                            delayed complications were discussed with the                            patient. Return to normal activities tomorrow.                            Written discharge instructions were provided to the                            patient.                           -  Resume previous diet.                           - Continue present medications.                           - Repeat colonoscopy at the next available                            appointment because the bowel preparation was poor.                           - Emerging evidence supports eating a diet of                            fruits, vegetables, grains, calcium, and yogurt                            while reducing red meat and alcohol may reduce the                            risk of colon cancer. Thornton Park MD, MD 11/24/2020 10:26:04 AM This report has been signed electronically.

## 2020-11-24 NOTE — Patient Instructions (Signed)
Please see scheduled appointments.   YOU HAD AN ENDOSCOPIC PROCEDURE TODAY AT [facility]:   Refer to the procedure report that was given to you for any specific questions about what was found during the examination.  If the procedure report does not answer your questions, please call your gastroenterologist to clarify.  If you requested that your care partner not be given the details of your procedure findings, then the procedure report has been included in a sealed envelope for you to review at your convenience later.  YOU SHOULD EXPECT: Some feelings of bloating in the abdomen. Passage of more gas than usual.  Walking can help get rid of the air that was put into your GI tract during the procedure and reduce the bloating. If you had a lower endoscopy (such as a colonoscopy or flexible sigmoidoscopy) you may notice spotting of blood in your stool or on the toilet paper. If you underwent a bowel prep for your procedure, you may not have a normal bowel movement for a few days.  Please Note:  You might notice some irritation and congestion in your nose or some drainage.  This is from the oxygen used during your procedure.  There is no need for concern and it should clear up in a day or so.  SYMPTOMS TO REPORT IMMEDIATELY:  Following lower endoscopy (colonoscopy or flexible sigmoidoscopy):  Excessive amounts of blood in the stool  Significant tenderness or worsening of abdominal pains  Swelling of the abdomen that is new, acute  Fever of 100F or higher  For urgent or emergent issues, a gastroenterologist can be reached at any hour by calling [phone]. Do not use MyChart messaging for urgent concerns.    DIET:  We do recommend a small meal at first, but then you may proceed to your regular diet.  Drink plenty of fluids but you should avoid alcoholic beverages for 24 hours.  ACTIVITY:  You should plan to take it easy for the rest of today and you should NOT DRIVE or use  heavy machinery until tomorrow (because of the sedation medicines used during the test).    FOLLOW UP: Our staff will call the number listed on your records 48-72 hours following your procedure to check on you and address any questions or concerns that you may have regarding the information given to you following your procedure. If we do not reach you, we will leave a message.  We will attempt to reach you two times.  During this call, we will ask if you have developed any symptoms of COVID 19. If you develop any symptoms (ie: fever, flu-like symptoms, shortness of breath, cough etc.) before then, please call [phone].  If you test positive for Covid 19 in the 2 weeks post procedure, please call and report this information to [place].    If any biopsies were taken you will be contacted by phone or by letter within the next 1-3 weeks.  Please call us at [phone] if you have not heard about the biopsies in 3 weeks.    SIGNATURES/CONFIDENTIALITY: You and/or your care partner have signed paperwork which will be entered into your electronic medical record.  These signatures attest to the fact that that the information above on your After Visit Summary has been reviewed and is understood.  Full responsibility of the confidentiality of this discharge information lies with you and/or your care-partner.

## 2020-11-25 ENCOUNTER — Telehealth: Payer: Self-pay | Admitting: *Deleted

## 2020-11-25 NOTE — Telephone Encounter (Signed)
Please acquire insulin pump instructions for the day before and day of colonoscopy. Thanks!

## 2020-11-26 ENCOUNTER — Telehealth: Payer: Self-pay | Admitting: *Deleted

## 2020-11-26 NOTE — Telephone Encounter (Signed)
  Follow up Call-  Call back number 11/24/2020  Post procedure Call Back phone  # [phone]  Permission to leave phone message Yes  Some recent data might be hidden     Patient questions:  Do you have a fever, pain , or abdominal swelling? No. Pain Score  0 *  Have you tolerated food without any problems? Yes.    Have you been able to return to your normal activities? Yes.    Do you have any questions about your discharge instructions: Diet   No. Medications  No. Follow up visit  No.  Do you have questions or concerns about your Care? No.  Actions: * If pain score is 4 or above: No action needed, pain <4.  Have you developed a fever since your procedure? no  2.   Have you had an respiratory symptoms (SOB or cough) since your procedure? no  3.   Have you tested positive for COVID 19 since your procedure no  4.   Have you had any family members/close contacts diagnosed with the COVID 19 since your procedure?  no   If yes to any of these questions please route to Joylene John, RN and Joella Prince, RN

## 2020-11-30 ENCOUNTER — Other Ambulatory Visit: Payer: Self-pay

## 2020-11-30 ENCOUNTER — Ambulatory Visit (AMBULATORY_SURGERY_CENTER): Payer: 59 | Admitting: *Deleted

## 2020-11-30 VITALS — Ht 70.0 in | Wt 300.0 lb

## 2020-11-30 DIAGNOSIS — R195 Other fecal abnormalities: Secondary | ICD-10-CM

## 2020-11-30 MED ORDER — PEG 3350-KCL-NA BICARB-NACL 420 G PO SOLR
4000.0000 mL | Freq: Once | ORAL | 0 refills | Status: AC
  Filled 2020-11-30: qty 4000, 1d supply, fill #0

## 2020-11-30 NOTE — Progress Notes (Signed)
  No egg or soy allergy known to patient  No issues with past sedation with any surgeries or procedures Patient denies ever being told they had issues or difficulty with intubation  No FH of Malignant Hyperthermia No diet pills per patient No home 02 use per patient  No blood thinners per patient  Pt STATES  issues with constipation THAT FLUCTUATES WITH DIARRHEA- NO MEDS CURRENTLY - USES METAMUCIL  No A fib or A flutter  EMMI video to pt or via [place] 19 guidelines implemented in PV today with Pt and RN  Pt is fully vaccinated  for Covid   Due to the COVID-19 pandemic we are asking patients to follow certain guidelines.  Pt aware of COVID protocols and [facility] guidelines

## 2020-12-02 ENCOUNTER — Ambulatory Visit: Payer: No Typology Code available for payment source | Admitting: Internal Medicine

## 2020-12-02 ENCOUNTER — Other Ambulatory Visit: Payer: Self-pay

## 2020-12-02 MED FILL — Tolterodine Tartrate Cap ER 24HR 4 MG: ORAL | 30 days supply | Qty: 30 | Fill #1 | Status: AC

## 2020-12-07 ENCOUNTER — Ambulatory Visit: Payer: No Typology Code available for payment source | Admitting: Critical Care Medicine

## 2020-12-07 ENCOUNTER — Encounter: Payer: Self-pay | Admitting: Gastroenterology

## 2020-12-07 ENCOUNTER — Ambulatory Visit (AMBULATORY_SURGERY_CENTER): Payer: 59 | Admitting: Gastroenterology

## 2020-12-07 ENCOUNTER — Other Ambulatory Visit: Payer: Self-pay

## 2020-12-07 VITALS — BP 130/71 | HR 67 | Temp 97.7°F | Resp 15 | Ht 70.0 in | Wt 300.0 lb

## 2020-12-07 DIAGNOSIS — R195 Other fecal abnormalities: Secondary | ICD-10-CM | POA: Diagnosis not present

## 2020-12-07 DIAGNOSIS — D122 Benign neoplasm of ascending colon: Secondary | ICD-10-CM | POA: Diagnosis not present

## 2020-12-07 MED ORDER — SODIUM CHLORIDE 0.9 % IV SOLN: 500.0000 mL | Freq: Once | INTRAVENOUS | Status: DC

## 2020-12-07 NOTE — Progress Notes (Signed)
Called to room to assist during endoscopic procedure.  Patient ID and intended procedure confirmed with present staff. Received instructions for my participation in the procedure from the performing physician.  

## 2020-12-07 NOTE — Patient Instructions (Signed)
YOU HAD AN ENDOSCOPIC PROCEDURE TODAY AT THE [facility]:   Refer to the procedure report that was given to you for any specific questions about what was found during the examination.  If the procedure report does not answer your questions, please call your gastroenterologist to clarify.  If you requested that your care partner not be given the details of your procedure findings, then the procedure report has been included in a sealed envelope for you to review at your convenience later.  YOU SHOULD EXPECT: Some feelings of bloating in the abdomen. Passage of more gas than usual.  Walking can help get rid of the air that was put into your GI tract during the procedure and reduce the bloating. If you had a lower endoscopy (such as a colonoscopy or flexible sigmoidoscopy) you may notice spotting of blood in your stool or on the toilet paper. If you underwent a bowel prep for your procedure, you may not have a normal bowel movement for a few days.  Please Note:  You might notice some irritation and congestion in your nose or some drainage.  This is from the oxygen used during your procedure.  There is no need for concern and it should clear up in a day or so.  SYMPTOMS TO REPORT IMMEDIATELY:   Following lower endoscopy (colonoscopy or flexible sigmoidoscopy):  Excessive amounts of blood in the stool  Significant tenderness or worsening of abdominal pains  Swelling of the abdomen that is new, acute  Fever of 100F or higher   Following upper endoscopy (EGD)  Vomiting of blood or coffee ground material  New chest pain or pain under the shoulder blades  Painful or persistently difficult swallowing  New shortness of breath  Fever of 100F or higher  Black, tarry-looking stools  For urgent or emergent issues, a gastroenterologist can be reached at any hour by calling [phone]. Do not use MyChart messaging for urgent concerns.    DIET:  We do recommend a small meal at first, but  then you may proceed to your regular diet.  Drink plenty of fluids but you should avoid alcoholic beverages for 24 hours.  ACTIVITY:  You should plan to take it easy for the rest of today and you should NOT DRIVE or use heavy machinery until tomorrow (because of the sedation medicines used during the test).    FOLLOW UP: Our staff will call the number listed on your records 48-72 hours following your procedure to check on you and address any questions or concerns that you may have regarding the information given to you following your procedure. If we do not reach you, we will leave a message.  We will attempt to reach you two times.  During this call, we will ask if you have developed any symptoms of COVID 19. If you develop any symptoms (ie: fever, flu-like symptoms, shortness of breath, cough etc.) before then, please call [phone].  If you test positive for Covid 19 in the 2 weeks post procedure, please call and report this information to us.    If any biopsies were taken you will be contacted by phone or by letter within the next 1-3 weeks.  Please call us at [phone] if you have not heard about the biopsies in 3 weeks.    SIGNATURES/CONFIDENTIALITY: You and/or your care partner have signed paperwork which will be entered into your electronic medical record.  These signatures attest to the fact that that the information above on   your After Visit Summary has been reviewed and is understood.  Full responsibility of the confidentiality of this discharge information lies with you and/or your care-partner. 

## 2020-12-07 NOTE — Progress Notes (Signed)
Pt. Reports no change in her medical or surgical history since her pre-visit 11/30/20.

## 2020-12-07 NOTE — Op Note (Signed)
[name]: Lynn Martinez Procedure Date: 12/07/2020 9:33 AM MRN: [id number] Endoscopist: Thornton Park MD, MD Age: 50 Referring MD:  Date of Birth: August 26, 1970 Gender: Female Account #: [id number] Procedure:                Colonoscopy Indications:              Occult blood noted in the stool                           Recent blood and mucus in the rectum                           Longstanding history of alternating diarrhea and                            constipation                           Failed attempt at colonoscopy 11/24/20 due to poor                            prep                           No GI malignancies in first-degree relatives                           Maternal grandmother with lymphoma and colon cancer Medicines:                Monitored Anesthesia Care Procedure:                Pre-Anesthesia Assessment:                           - Prior to the procedure, a History and Physical                            was performed, and patient medications and                            allergies were reviewed. The patient's tolerance of                            previous anesthesia was also reviewed. The risks                            and benefits of the procedure and the sedation                            options and risks were discussed with the patient.                            All questions were answered, and informed consent                            was obtained. Prior Anticoagulants: The patient  has                            taken no previous anticoagulant or antiplatelet                            agents. ASA Grade Assessment: II - A patient with                            mild systemic disease. After reviewing the risks                            and benefits, the patient was deemed in                            satisfactory condition to undergo the procedure.                           After obtaining informed consent, the  colonoscope                            was passed under direct vision. Throughout the                            procedure, the patient's blood pressure, pulse, and                            oxygen saturations were monitored continuously. The                            Olympus CF-[id number]) Colonoscope was                            introduced through the anus and advanced to the 3                            cm into the ileum. A second forward view of the                            right colon was performed. The colonoscopy was                            performed with moderate difficulty due to                            restricted mobility of the colon, a redundant colon                            and significant looping. Successful completion of                            the procedure was aided by applying abdominal  pressure. The patient tolerated the procedure well.                            The quality of the bowel preparation was excellent.                            The terminal ileum, ileocecal valve, appendiceal                            orifice, and rectum were photographed. Scope In: 9:42:01 AM Scope Out: 9:59:51 AM Scope Withdrawal Time: 0 hours 14 minutes 29 seconds  Total Procedure Duration: 0 hours 17 minutes 50 seconds  Findings:                 The perianal and digital rectal examinations were                            normal.                           A 3 mm polyp was found in the distal ascending                            colon. The polyp was flat. The polyp was removed                            with a cold snare. Resection and retrieval were                            complete. Estimated blood loss was minimal.                           The entire colon was tortuous. The rectosigmoid                            junction is fixed.                           The exam was otherwise without abnormality on                             direct and retroflexion views except for small                            internal hemorrhoids. Complications:            No immediate complications. Estimated blood loss:                            Minimal. Estimated Blood Loss:     Estimated blood loss was minimal. Impression:               - One 3 mm polyp in the distal ascending colon,                            removed with a  cold snare. Resected and retrieved.                           - Tortuous colon. Recommendation:           - Patient has a contact number available for                            emergencies. The signs and symptoms of potential                            delayed complications were discussed with the                            patient. Return to normal activities tomorrow.                            Written discharge instructions were provided to the                            patient.                           - Resume previous diet.                           - Continue present medications.                           - Await pathology results.                           - Repeat colonoscopy date to be determined after                            pending pathology results are reviewed for                            surveillance. Two day bowel prep recommended in the                            future.                           - Emerging evidence supports eating a diet of                            fruits, vegetables, grains, calcium, and yogurt                            while reducing red meat and alcohol may reduce the                            risk of colon cancer.                           - Thank you for allowing  me to be involved in your                            colon cancer prevention. Thornton Park MD, MD 12/07/2020 10:08:12 AM This report has been signed electronically.

## 2020-12-07 NOTE — Progress Notes (Signed)
Report to PACU, RN, vss, BBS= Clear.  

## 2020-12-09 ENCOUNTER — Telehealth: Payer: Self-pay

## 2020-12-09 NOTE — Telephone Encounter (Signed)
  Follow up Call-  Call back number 12/07/2020 11/24/2020  Post procedure Call Back phone  # [phone]  Permission to leave phone message Yes Yes  Some recent data might be hidden     Patient questions:  Do you have a fever, pain , or abdominal swelling? No. Pain Score  0 *  Have you tolerated food without any problems? Yes.    Have you been able to return to your normal activities? Yes.    Do you have any questions about your discharge instructions: Diet   No. Medications  No. Follow up visit  No.  Do you have questions or concerns about your Care? No.  Actions: * If pain score is 4 or above: No action needed, pain <4.

## 2020-12-11 ENCOUNTER — Encounter: Payer: Self-pay | Admitting: Gastroenterology

## 2021-01-08 ENCOUNTER — Other Ambulatory Visit (HOSPITAL_COMMUNITY): Payer: Self-pay | Admitting: Psychiatry

## 2021-01-08 ENCOUNTER — Other Ambulatory Visit: Payer: Self-pay | Admitting: Critical Care Medicine

## 2021-01-08 ENCOUNTER — Other Ambulatory Visit: Payer: Self-pay

## 2021-01-08 DIAGNOSIS — E119 Type 2 diabetes mellitus without complications: Secondary | ICD-10-CM

## 2021-01-08 DIAGNOSIS — F3176 Bipolar disorder, in full remission, most recent episode depressed: Secondary | ICD-10-CM

## 2021-01-08 DIAGNOSIS — Z794 Long term (current) use of insulin: Secondary | ICD-10-CM

## 2021-01-08 MED ORDER — PIOGLITAZONE HCL 30 MG PO TABS
30.0000 mg | ORAL_TABLET | Freq: Every day | ORAL | 0 refills | Status: DC
  Filled 2021-01-08 – 2021-01-29 (×2): qty 30, 30d supply, fill #0

## 2021-01-08 MED ORDER — CLINDAMYCIN PHOS-BENZOYL PEROX 1-5 % EX GEL
Freq: Two times a day (BID) | CUTANEOUS | 0 refills | Status: DC
  Filled 2021-01-08: qty 25, 30d supply, fill #0

## 2021-01-08 MED FILL — Levothyroxine Sodium Tab 137 MCG: ORAL | 30 days supply | Qty: 30 | Fill #3 | Status: AC

## 2021-01-08 MED FILL — Omeprazole Cap Delayed Release 40 MG: ORAL | 30 days supply | Qty: 60 | Fill #3 | Status: AC

## 2021-01-08 MED FILL — Propranolol HCl Tab 40 MG: ORAL | 30 days supply | Qty: 60 | Fill #3 | Status: AC

## 2021-01-08 NOTE — Telephone Encounter (Signed)
  Notes to clinic:  Patient has upcoming appt on 01/19/2021 Due for follow up in May  Review for refills   Requested Prescriptions  Pending Prescriptions Disp Refills   clindamycin-benzoyl peroxide (BENZACLIN) gel 25 g 1    Sig: APPLY TOPICALLY 2 (TWO) TIMES DAILY. TO Lynn Martinez AREAS      Dermatology:  Acne preparations Passed - 01/08/2021  3:25 PM      Passed - Valid encounter within last 12 months    Recent Outpatient Visits           4 months ago [place] Elsie Stain, MD   6 months ago Type 2 diabetes mellitus without complication, with long-term current use of insulin [facility])   [place] Elsie Stain, MD   9 months ago Type 2 diabetes mellitus without complication, with long-term current use of insulin [facility])   [place] Co[place]ran, [place], [place]   1 year ago Atypical chest pain   [place] Fulp, [place], MD   1 year ago Need for influenza vaccination   [place], Stephen L, RPH-CPP       Future Appointments             In 1 week Elsie Stain, MD [name]               pioglitazone (ACTOS) 30 MG tablet 30 tablet 2    Sig: TAKE 1 TABLET (30 MG TOTAL) BY MOUTH DAILY.      Endocrinology:  Diabetes - Glitazones - pioglitazone Failed - 01/08/2021  3:25 PM      Failed - HBA1C is between 0 and 7.9 and within 180 days    HbA1c, POC (prediabetic range)  Date Value Ref Range Status  03/15/2018 11.1 (A) 5.7 - 6.4 % Final   HbA1c, POC (controlled diabetic range)  Date Value Ref Range Status  07/09/2020 13.7 (A) 0.0 - 7.0 % Final          Passed - Valid encounter within last 6 months    Recent Outpatient Visits           4 months ago [place] Elsie Stain, MD   6 months ago Type 2  diabetes mellitus without complication, with long-term current use of insulin [facility])   [place] Elsie Stain, MD   9 months ago Type 2 diabetes mellitus without complication, with long-term current use of insulin [facility])   [place], [place], [place]   1 year ago Atypical chest pain   [place] Fulp, [place], MD   1 year ago Need for influenza vaccination   [place], RPH-CPP       Future Appointments             In 1 week [name], MD [place]

## 2021-01-11 ENCOUNTER — Other Ambulatory Visit: Payer: Self-pay

## 2021-01-12 ENCOUNTER — Other Ambulatory Visit (HOSPITAL_COMMUNITY): Payer: Self-pay | Admitting: Psychiatry

## 2021-01-12 ENCOUNTER — Other Ambulatory Visit: Payer: Self-pay

## 2021-01-12 DIAGNOSIS — F3176 Bipolar disorder, in full remission, most recent episode depressed: Secondary | ICD-10-CM

## 2021-01-12 MED ORDER — LURASIDONE HCL 80 MG PO TABS
ORAL_TABLET | Freq: Every day | ORAL | 1 refills | Status: DC
Start: 2021-01-12 — End: 2021-01-19
  Filled 2021-01-12: qty 30, 30d supply, fill #0

## 2021-01-12 MED ORDER — PRAZOSIN HCL 1 MG PO CAPS
ORAL_CAPSULE | ORAL | 1 refills | Status: DC
Start: 2021-01-12 — End: 2021-01-19
  Filled 2021-01-12: qty 30, 30d supply, fill #0

## 2021-01-12 MED ORDER — LITHIUM CARBONATE ER 450 MG PO TBCR
EXTENDED_RELEASE_TABLET | Freq: Every day | ORAL | 1 refills | Status: DC
Start: 2021-01-12 — End: 2021-01-19
  Filled 2021-01-12 – 2021-01-19 (×2): qty 60, 30d supply, fill #0

## 2021-01-12 MED ORDER — CITALOPRAM HYDROBROMIDE 40 MG PO TABS
40.0000 mg | ORAL_TABLET | Freq: Every day | ORAL | 1 refills | Status: DC
  Filled 2021-01-12: qty 30, 30d supply, fill #0

## 2021-01-12 MED ORDER — HYDROXYZINE HCL 25 MG PO TABS
ORAL_TABLET | Freq: Two times a day (BID) | ORAL | 1 refills | Status: DC
Start: 2021-01-12 — End: 2021-01-19
  Filled 2021-01-12: qty 60, 30d supply, fill #0

## 2021-01-13 ENCOUNTER — Other Ambulatory Visit: Payer: Self-pay

## 2021-01-15 ENCOUNTER — Other Ambulatory Visit: Payer: Self-pay

## 2021-01-18 ENCOUNTER — Other Ambulatory Visit: Payer: Self-pay

## 2021-01-19 ENCOUNTER — Ambulatory Visit: Payer: 59 | Attending: Critical Care Medicine | Admitting: Critical Care Medicine

## 2021-01-19 ENCOUNTER — Other Ambulatory Visit: Payer: Self-pay

## 2021-01-19 ENCOUNTER — Telehealth (HOSPITAL_COMMUNITY): Payer: Self-pay | Admitting: Psychiatry

## 2021-01-19 ENCOUNTER — Other Ambulatory Visit (HOSPITAL_COMMUNITY): Payer: Self-pay | Admitting: Psychiatry

## 2021-01-19 ENCOUNTER — Encounter: Payer: Self-pay | Admitting: Critical Care Medicine

## 2021-01-19 VITALS — BP 139/82 | HR 89 | Ht 70.0 in | Wt 301.2 lb

## 2021-01-19 DIAGNOSIS — E785 Hyperlipidemia, unspecified: Secondary | ICD-10-CM

## 2021-01-19 DIAGNOSIS — E1165 Type 2 diabetes mellitus with hyperglycemia: Secondary | ICD-10-CM

## 2021-01-19 DIAGNOSIS — M545 Low back pain, unspecified: Secondary | ICD-10-CM | POA: Diagnosis not present

## 2021-01-19 DIAGNOSIS — G8929 Other chronic pain: Secondary | ICD-10-CM

## 2021-01-19 DIAGNOSIS — F339 Major depressive disorder, recurrent, unspecified: Secondary | ICD-10-CM

## 2021-01-19 DIAGNOSIS — I10 Essential (primary) hypertension: Secondary | ICD-10-CM

## 2021-01-19 DIAGNOSIS — L750 Bromhidrosis: Secondary | ICD-10-CM

## 2021-01-19 DIAGNOSIS — F3176 Bipolar disorder, in full remission, most recent episode depressed: Secondary | ICD-10-CM

## 2021-01-19 DIAGNOSIS — G43909 Migraine, unspecified, not intractable, without status migrainosus: Secondary | ICD-10-CM | POA: Diagnosis not present

## 2021-01-19 DIAGNOSIS — N3281 Overactive bladder: Secondary | ICD-10-CM

## 2021-01-19 DIAGNOSIS — R0789 Other chest pain: Secondary | ICD-10-CM

## 2021-01-19 DIAGNOSIS — E1169 Type 2 diabetes mellitus with other specified complication: Secondary | ICD-10-CM

## 2021-01-19 DIAGNOSIS — Z794 Long term (current) use of insulin: Secondary | ICD-10-CM | POA: Diagnosis not present

## 2021-01-19 DIAGNOSIS — E890 Postprocedural hypothyroidism: Secondary | ICD-10-CM

## 2021-01-19 LAB — POCT GLYCOSYLATED HEMOGLOBIN (HGB A1C): HbA1c, POC (controlled diabetic range): 11.6 % — AB (ref 0.0–7.0)

## 2021-01-19 MED ORDER — LURASIDONE HCL 80 MG PO TABS
ORAL_TABLET | Freq: Every day | ORAL | 2 refills | Status: DC
Start: 2021-01-19 — End: 2021-03-17
  Filled 2021-01-19 – 2021-01-29 (×3): qty 30, 30d supply, fill #0
  Filled 2021-03-01: qty 30, 30d supply, fill #1

## 2021-01-19 MED ORDER — ATORVASTATIN CALCIUM 10 MG PO TABS
ORAL_TABLET | ORAL | 3 refills | Status: DC
  Filled 2021-01-19: qty 90, 90d supply, fill #0
  Filled 2021-01-29: qty 30, 30d supply, fill #0
  Filled 2021-03-01: qty 30, 30d supply, fill #1
  Filled 2021-04-12: qty 30, 30d supply, fill #2

## 2021-01-19 MED ORDER — CLINDAMYCIN PHOS-BENZOYL PEROX 1-5 % EX GEL
Freq: Two times a day (BID) | CUTANEOUS | 4 refills | Status: DC
  Filled 2021-01-19: qty 25, 7d supply, fill #0
  Filled 2021-01-29: qty 25, 30d supply, fill #0
  Filled 2021-03-01: qty 25, 30d supply, fill #1

## 2021-01-19 MED ORDER — METHOCARBAMOL 500 MG PO TABS
ORAL_TABLET | ORAL | 0 refills | Status: DC
  Filled 2021-01-19 – 2021-03-01 (×2): qty 90, 30d supply, fill #0

## 2021-01-19 MED ORDER — CITALOPRAM HYDROBROMIDE 40 MG PO TABS
40.0000 mg | ORAL_TABLET | Freq: Every day | ORAL | 2 refills | Status: DC
  Filled 2021-01-19 – 2021-01-29 (×2): qty 30, 30d supply, fill #0
  Filled 2021-03-01: qty 30, 30d supply, fill #1

## 2021-01-19 MED ORDER — LITHIUM CARBONATE ER 450 MG PO TBCR
EXTENDED_RELEASE_TABLET | Freq: Every day | ORAL | 2 refills | Status: DC
  Filled 2021-01-19 – 2021-01-29 (×2): qty 60, 30d supply, fill #0
  Filled 2021-03-01: qty 60, 30d supply, fill #1

## 2021-01-19 MED ORDER — PROPRANOLOL HCL 40 MG PO TABS
ORAL_TABLET | Freq: Two times a day (BID) | ORAL | 3 refills | Status: DC
  Filled 2021-01-19: qty 60, fill #0
  Filled 2021-03-01: qty 60, 30d supply, fill #0
  Filled 2021-04-22: qty 60, 30d supply, fill #1

## 2021-01-19 MED ORDER — HYDROXYZINE HCL 25 MG PO TABS
ORAL_TABLET | Freq: Two times a day (BID) | ORAL | 2 refills | Status: DC
  Filled 2021-01-19 – 2021-01-29 (×2): qty 60, 30d supply, fill #0
  Filled 2021-03-01: qty 60, 30d supply, fill #1

## 2021-01-19 MED ORDER — PRAZOSIN HCL 1 MG PO CAPS
ORAL_CAPSULE | ORAL | 2 refills | Status: DC
Start: 2021-01-19 — End: 2021-03-17
  Filled 2021-01-19 – 2021-01-29 (×2): qty 30, 30d supply, fill #0
  Filled 2021-03-01: qty 30, 30d supply, fill #1

## 2021-01-19 MED ORDER — VITAMIN D (ERGOCALCIFEROL) 1.25 MG (50000 UNIT) PO CAPS
ORAL_CAPSULE | ORAL | 1 refills | Status: DC
  Filled 2021-01-19: qty 12, 84d supply, fill #0
  Filled 2021-01-29: qty 4, 28d supply, fill #0
  Filled 2021-03-01: qty 4, 28d supply, fill #1
  Filled 2021-04-22: qty 4, 28d supply, fill #2

## 2021-01-19 MED ORDER — OMEPRAZOLE 40 MG PO CPDR
40.0000 mg | DELAYED_RELEASE_CAPSULE | Freq: Every day | ORAL | 4 refills | Status: DC
  Filled 2021-01-19: qty 40, 40d supply, fill #0

## 2021-01-19 MED ORDER — GABAPENTIN 300 MG PO CAPS
ORAL_CAPSULE | ORAL | 2 refills | Status: DC
  Filled 2021-01-19: qty 60, 30d supply, fill #0

## 2021-01-19 MED ORDER — LEVOTHYROXINE SODIUM 137 MCG PO TABS
ORAL_TABLET | ORAL | 3 refills | Status: DC
Start: 2021-01-19 — End: 2021-05-20
  Filled 2021-01-19: qty 90, fill #0
  Filled 2021-03-01: qty 30, 30d supply, fill #0
  Filled 2021-04-13: qty 30, 30d supply, fill #1

## 2021-01-19 NOTE — Assessment & Plan Note (Signed)
Stable on propranolol '40mg'$  daily. Patient reports high stress with job and insomnia that contribute to high blood pressure. She was encouraged to apply for a membership to the [facility] and patient is motivated to make positive health changes. Also encouraged to increase fruit, vegetable and fiber intake.
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
## 2021-01-19 NOTE — Assessment & Plan Note (Signed)
Also likely contributed to hyperglycemia. Currently on Myrbetirq and has appointment with urology tomorrow. No changes made today. Will obtain UA microalbumin today to screen for proteinuria. Not on Detrol

## 2021-01-19 NOTE — Assessment & Plan Note (Addendum)
Managed by endocrinology with dexcom pump and implantable meter. Has had difficulty placing on body and has not been testing blood sugar or administering insulin in the past 2 months. A1C 11.6 today and down from 13.7 in January. Instructed patient to made a follow up with endocrinologist so she can get back on with her continuous meter and insulin pump.   Plan to recheck urine microalbumin today.   She was instructed to make an appointment for an annual diabetic eye exam this fall with [facility].

## 2021-01-19 NOTE — Assessment & Plan Note (Signed)
High stress with job and lack of diabetes equipment. Currently established with psychiatry and also has therapist and engages with them regularly. She has an upcoming appointment with psychiatrist this month.

## 2021-01-19 NOTE — Assessment & Plan Note (Signed)
History of graves disease and s/p total thyroidectomy. On lifelong thyroid replacement managed by endocrinology. TSH was 12.5 in March 2022

## 2021-01-19 NOTE — Telephone Encounter (Signed)
Patient refilled and sent to preferred pharmacy.

## 2021-01-19 NOTE — Patient Instructions (Signed)
We gave you a dental resource sheet to consider dentists in the community  Consider joining [employer] in improving your health moving your body more  Refills on all your medications sent to our pharmacy  We discussed a trigger to try to get the insulin pump going again and you are going to focus on eating the rainbow with healthier dietary choices considering going to the [place] on Saturday mornings between [place] and [place]  Referral to dermatology was sent  Referral back to Dr. Kelton Pillar was sent for endocrinology  Return to see Dr. Joya Gaskins in 2 months  Only lab today is a urine sample to check for protein in the urine  Your A1c is down to 11-1/2 keep up the good work and get back on your insulin program
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
## 2021-01-19 NOTE — Assessment & Plan Note (Addendum)
Improved with topical clindamycin gel but not resolved. She requests a referral to a dermatologist that will be sent today. Discussed that her uncontrolled diabetes can affect the health of her skin which may contribute to symptoms.

## 2021-01-20 ENCOUNTER — Other Ambulatory Visit: Payer: Self-pay

## 2021-01-20 ENCOUNTER — Encounter (HOSPITAL_COMMUNITY): Payer: Self-pay | Admitting: Psychiatry

## 2021-01-21 ENCOUNTER — Other Ambulatory Visit: Payer: Self-pay

## 2021-01-21 LAB — MICROALBUMIN / CREATININE URINE RATIO
Creatinine, Urine: 94.6 mg/dL
Microalb/Creat Ratio: 40 mg/g creat — ABNORMAL HIGH (ref 0–29)
Microalbumin, Urine: 38.3 ug/mL

## 2021-01-22 ENCOUNTER — Telehealth (HOSPITAL_COMMUNITY): Payer: Self-pay | Admitting: *Deleted

## 2021-01-22 NOTE — Telephone Encounter (Signed)
Prior auth submitted for patients Lynn Martinez has been approved by [employer]. Pharmacy notified.  It is effective from 8/12-8/04/2022.

## 2021-01-25 ENCOUNTER — Telehealth (HOSPITAL_COMMUNITY): Payer: Self-pay | Admitting: *Deleted

## 2021-01-25 NOTE — Telephone Encounter (Signed)
[facility] -- MEDIMPACT  APPROVED LATUDA 8 MG TABLET Pharmacy notified.  It is effective from 8/12-8/04/2022 for Quantity limit 30 tablets per 30 days

## 2021-01-26 ENCOUNTER — Other Ambulatory Visit: Payer: Self-pay

## 2021-01-27 ENCOUNTER — Other Ambulatory Visit: Payer: Self-pay

## 2021-01-29 ENCOUNTER — Other Ambulatory Visit: Payer: Self-pay

## 2021-03-01 ENCOUNTER — Other Ambulatory Visit: Payer: Self-pay

## 2021-03-01 ENCOUNTER — Other Ambulatory Visit: Payer: Self-pay | Admitting: Critical Care Medicine

## 2021-03-01 DIAGNOSIS — Z794 Long term (current) use of insulin: Secondary | ICD-10-CM

## 2021-03-01 DIAGNOSIS — E119 Type 2 diabetes mellitus without complications: Secondary | ICD-10-CM

## 2021-03-01 NOTE — Telephone Encounter (Signed)
Requested medication (s) are due for refill today - no  Requested medication (s) are on the active medication list -no  Future visit scheduled -yes  Last refill: 01/08/21  Notes to clinic: Request RF: no longer on current medication list  Requested Prescriptions  Pending Prescriptions Disp Refills   pioglitazone (ACTOS) 30 MG tablet 30 tablet 0    Sig: Take 1 tablet (30 mg total) by mouth daily.     Endocrinology:  Diabetes - Glitazones - pioglitazone Failed - 03/01/2021  8:56 AM      Failed - HBA1C is between 0 and 7.9 and within 180 days    HbA1c, POC (prediabetic range)  Date Value Ref Range Status  03/15/2018 11.1 (A) 5.7 - 6.4 % Final   HbA1c, POC (controlled diabetic range)  Date Value Ref Range Status  01/19/2021 11.6 (A) 0.0 - 7.0 % Final          Passed - Valid encounter within last 6 months    Recent Outpatient Visits           1 month ago Type 2 diabetes mellitus with hyperglycemia, with long-term current use of insulin ([place])   [place] Elsie Stain, MD   6 months ago [place] Elsie Stain, MD   7 months ago Type 2 diabetes mellitus without complication, with long-term current use of insulin [facility])   [place] Elsie Stain, MD   11 months ago Type 2 diabetes mellitus without complication, with long-term current use of insulin [facility])   [place], [place], [place]   1 year ago Atypical chest pain   [place], MD       Future Appointments             In 4 weeks Elsie Stain, MD [place]               Requested Prescriptions  Pending Prescriptions Disp Refills   pioglitazone (ACTOS) 30 MG tablet 30 tablet 0    Sig: Take 1 tablet (30 mg total) by mouth daily.     Endocrinology:  Diabetes - Glitazones -  pioglitazone Failed - 03/01/2021  8:56 AM      Failed - HBA1C is between 0 and 7.9 and within 180 days    HbA1c, POC (prediabetic range)  Date Value Ref Range Status  03/15/2018 11.1 (A) 5.7 - 6.4 % Final   HbA1c, POC (controlled diabetic range)  Date Value Ref Range Status  01/19/2021 11.6 (A) 0.0 - 7.0 % Final          Passed - Valid encounter within last 6 months    Recent Outpatient Visits           1 month ago Type 2 diabetes mellitus with hyperglycemia, with long-term current use of insulin [facility])   [place] Elsie Stain, MD   6 months ago [place] Elsie Stain, MD   7 months ago Type 2 diabetes mellitus without complication, with long-term current use of insulin [facility])   [place] Elsie Stain, MD   11 months ago Type 2 diabetes mellitus without complication, with long-term current use of insulin ([place])   [place]  [place] Donovan, PA-C   1 year ago Atypical chest pain   [place], MD       Future Appointments             In 4 weeks [name], MD [place]

## 2021-03-02 ENCOUNTER — Other Ambulatory Visit: Payer: Self-pay

## 2021-03-10 ENCOUNTER — Other Ambulatory Visit: Payer: Self-pay

## 2021-03-17 ENCOUNTER — Ambulatory Visit (INDEPENDENT_AMBULATORY_CARE_PROVIDER_SITE_OTHER): Payer: No Payment, Other | Admitting: Psychiatry

## 2021-03-17 ENCOUNTER — Encounter (HOSPITAL_COMMUNITY): Payer: Self-pay | Admitting: Psychiatry

## 2021-03-17 ENCOUNTER — Other Ambulatory Visit: Payer: Self-pay

## 2021-03-17 DIAGNOSIS — F3176 Bipolar disorder, in full remission, most recent episode depressed: Secondary | ICD-10-CM | POA: Diagnosis not present

## 2021-03-17 MED ORDER — HYDROXYZINE HCL 25 MG PO TABS
ORAL_TABLET | Freq: Two times a day (BID) | ORAL | 3 refills | Status: DC
  Filled 2021-03-17: qty 60, fill #0
  Filled 2021-04-22: qty 60, 30d supply, fill #0
  Filled 2021-06-15: qty 60, 30d supply, fill #1

## 2021-03-17 MED ORDER — LITHIUM CARBONATE ER 450 MG PO TBCR
EXTENDED_RELEASE_TABLET | Freq: Every day | ORAL | 3 refills | Status: DC
  Filled 2021-03-17: qty 60, fill #0
  Filled 2021-04-12: qty 60, 30d supply, fill #0

## 2021-03-17 MED ORDER — PRAZOSIN HCL 1 MG PO CAPS
ORAL_CAPSULE | ORAL | 3 refills | Status: DC
  Filled 2021-03-17: qty 30, fill #0
  Filled 2021-04-12: qty 30, 30d supply, fill #0

## 2021-03-17 MED ORDER — CITALOPRAM HYDROBROMIDE 40 MG PO TABS
40.0000 mg | ORAL_TABLET | Freq: Every day | ORAL | 3 refills | Status: DC
Start: 2021-03-17 — End: 2021-05-24
  Filled 2021-03-17 – 2021-04-22 (×2): qty 30, 30d supply, fill #0

## 2021-03-17 MED ORDER — LURASIDONE HCL 80 MG PO TABS
ORAL_TABLET | Freq: Every day | ORAL | 3 refills | Status: DC
  Filled 2021-03-17: qty 30, fill #0
  Filled 2021-04-12: qty 30, 30d supply, fill #0
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
## 2021-03-29 ENCOUNTER — Other Ambulatory Visit: Payer: Self-pay

## 2021-03-30 ENCOUNTER — Encounter: Payer: Self-pay | Admitting: Critical Care Medicine

## 2021-03-30 ENCOUNTER — Other Ambulatory Visit: Payer: Self-pay

## 2021-03-30 ENCOUNTER — Ambulatory Visit: Payer: Self-pay | Attending: Critical Care Medicine | Admitting: Critical Care Medicine

## 2021-03-30 DIAGNOSIS — Z91199 Patient's noncompliance with other medical treatment and regimen due to unspecified reason: Secondary | ICD-10-CM

## 2021-03-30 NOTE — Progress Notes (Signed)
Called pt and no answer. Left VM will need to reschedule

## 2021-04-09 ENCOUNTER — Emergency Department (HOSPITAL_COMMUNITY): Payer: Self-pay

## 2021-04-09 ENCOUNTER — Other Ambulatory Visit: Payer: Self-pay

## 2021-04-09 ENCOUNTER — Inpatient Hospital Stay (HOSPITAL_COMMUNITY): Payer: Self-pay

## 2021-04-09 ENCOUNTER — Encounter (HOSPITAL_COMMUNITY): Payer: Self-pay

## 2021-04-09 ENCOUNTER — Inpatient Hospital Stay (HOSPITAL_COMMUNITY)
Admission: EM | Admit: 2021-04-09 | Discharge: 2021-04-11 | DRG: 342 | Disposition: A | Discharge status: Home / Self Care | Payer: Self-pay | Attending: Family Medicine | Admitting: Family Medicine

## 2021-04-09 DIAGNOSIS — Z886 Allergy status to analgesic agent status: Secondary | ICD-10-CM

## 2021-04-09 DIAGNOSIS — E890 Postprocedural hypothyroidism: Secondary | ICD-10-CM | POA: Diagnosis present

## 2021-04-09 DIAGNOSIS — Z885 Allergy status to narcotic agent status: Secondary | ICD-10-CM

## 2021-04-09 DIAGNOSIS — E1165 Type 2 diabetes mellitus with hyperglycemia: Secondary | ICD-10-CM | POA: Diagnosis present

## 2021-04-09 DIAGNOSIS — Z807 Family history of other malignant neoplasms of lymphoid, hematopoietic and related tissues: Secondary | ICD-10-CM

## 2021-04-09 DIAGNOSIS — Z8349 Family history of other endocrine, nutritional and metabolic diseases: Secondary | ICD-10-CM

## 2021-04-09 DIAGNOSIS — I10 Essential (primary) hypertension: Secondary | ICD-10-CM | POA: Diagnosis present

## 2021-04-09 DIAGNOSIS — K353 Acute appendicitis with localized peritonitis, without perforation or gangrene: Secondary | ICD-10-CM

## 2021-04-09 DIAGNOSIS — Z23 Encounter for immunization: Secondary | ICD-10-CM

## 2021-04-09 DIAGNOSIS — Z90710 Acquired absence of both cervix and uterus: Secondary | ICD-10-CM

## 2021-04-09 DIAGNOSIS — Z20822 Contact with and (suspected) exposure to covid-19: Secondary | ICD-10-CM | POA: Diagnosis present

## 2021-04-09 DIAGNOSIS — F4310 Post-traumatic stress disorder, unspecified: Secondary | ICD-10-CM | POA: Diagnosis present

## 2021-04-09 DIAGNOSIS — Z6841 Body Mass Index (BMI) 40.0 and over, adult: Secondary | ICD-10-CM

## 2021-04-09 DIAGNOSIS — K219 Gastro-esophageal reflux disease without esophagitis: Secondary | ICD-10-CM | POA: Diagnosis present

## 2021-04-09 DIAGNOSIS — Z8249 Family history of ischemic heart disease and other diseases of the circulatory system: Secondary | ICD-10-CM

## 2021-04-09 DIAGNOSIS — F339 Major depressive disorder, recurrent, unspecified: Secondary | ICD-10-CM | POA: Diagnosis present

## 2021-04-09 DIAGNOSIS — R011 Cardiac murmur, unspecified: Secondary | ICD-10-CM

## 2021-04-09 DIAGNOSIS — Z800 Family history of malignant neoplasm of digestive organs: Secondary | ICD-10-CM

## 2021-04-09 DIAGNOSIS — K660 Peritoneal adhesions (postprocedural) (postinfection): Secondary | ICD-10-CM | POA: Diagnosis present

## 2021-04-09 DIAGNOSIS — K3580 Unspecified acute appendicitis: Principal | ICD-10-CM | POA: Diagnosis present

## 2021-04-09 DIAGNOSIS — Z833 Family history of diabetes mellitus: Secondary | ICD-10-CM

## 2021-04-09 DIAGNOSIS — Z8371 Family history of colonic polyps: Secondary | ICD-10-CM

## 2021-04-09 DIAGNOSIS — R1031 Right lower quadrant pain: Secondary | ICD-10-CM

## 2021-04-09 DIAGNOSIS — E11649 Type 2 diabetes mellitus with hypoglycemia without coma: Secondary | ICD-10-CM | POA: Diagnosis not present

## 2021-04-09 DIAGNOSIS — Z880 Allergy status to penicillin: Secondary | ICD-10-CM

## 2021-04-09 DIAGNOSIS — Z794 Long term (current) use of insulin: Secondary | ICD-10-CM

## 2021-04-09 DIAGNOSIS — F3132 Bipolar disorder, current episode depressed, moderate: Secondary | ICD-10-CM | POA: Diagnosis present

## 2021-04-09 DIAGNOSIS — Z888 Allergy status to other drugs, medicaments and biological substances status: Secondary | ICD-10-CM

## 2021-04-09 DIAGNOSIS — E039 Hypothyroidism, unspecified: Secondary | ICD-10-CM | POA: Diagnosis present

## 2021-04-09 DIAGNOSIS — E785 Hyperlipidemia, unspecified: Secondary | ICD-10-CM | POA: Diagnosis present

## 2021-04-09 LAB — CBC
HCT: 41.1 % (ref 36.0–46.0)
Hemoglobin: 13.1 g/dL (ref 12.0–15.0)
MCH: 26.6 pg (ref 26.0–34.0)
MCHC: 31.9 g/dL (ref 30.0–36.0)
MCV: 83.5 fL (ref 80.0–100.0)
Platelets: 460 10*3/uL — ABNORMAL HIGH (ref 150–400)
RBC: 4.92 MIL/uL (ref 3.87–5.11)
RDW: 14 % (ref 11.5–15.5)
WBC: 8.1 10*3/uL (ref 4.0–10.5)
nRBC: 0 % (ref 0.0–0.2)

## 2021-04-09 LAB — COMPREHENSIVE METABOLIC PANEL
ALT: 17 U/L (ref 0–44)
AST: 15 U/L (ref 15–41)
Albumin: 3.8 g/dL (ref 3.5–5.0)
Alkaline Phosphatase: 80 U/L (ref 38–126)
Anion gap: 10 (ref 5–15)
BUN: 7 mg/dL (ref 6–20)
CO2: 22 mmol/L (ref 22–32)
Calcium: 9.2 mg/dL (ref 8.9–10.3)
Chloride: 102 mmol/L (ref 98–111)
Creatinine, Ser: 0.52 mg/dL (ref 0.44–1.00)
GFR, Estimated: >60 mL/min (ref >60)
Glucose, Bld: 351 mg/dL — ABNORMAL HIGH (ref 70–99)
Potassium: 3.8 mmol/L (ref 3.5–5.1)
Sodium: 134 mmol/L — ABNORMAL LOW (ref 135–145)
Total Bilirubin: 0.3 mg/dL (ref 0.3–1.2)
Total Protein: 7.5 g/dL (ref 6.5–8.1)

## 2021-04-09 LAB — RESP PANEL BY RT-PCR (FLU A&B, COVID) ARPGX2
Influenza A by PCR: NEGATIVE
Influenza B by PCR: NEGATIVE
SARS Coronavirus 2 by RT PCR: NEGATIVE

## 2021-04-09 LAB — URINALYSIS, ROUTINE W REFLEX MICROSCOPIC
Bilirubin Urine: NEGATIVE
Glucose, UA: >=500 mg/dL — AB
Hgb urine dipstick: NEGATIVE
Ketones, ur: 20 mg/dL — AB
Leukocytes,Ua: NEGATIVE
Nitrite: NEGATIVE
Protein, ur: NEGATIVE mg/dL
Specific Gravity, Urine: 1.028 (ref 1.005–1.030)
pH: 5 (ref 5.0–8.0)

## 2021-04-09 LAB — GLUCOSE, CAPILLARY: Glucose-Capillary: 366 mg/dL — ABNORMAL HIGH (ref 70–99)

## 2021-04-09 LAB — LIPASE, BLOOD: Lipase: 18 U/L (ref 11–51)

## 2021-04-09 LAB — MRSA NEXT GEN BY PCR, NASAL: MRSA by PCR Next Gen: NOT DETECTED

## 2021-04-09 IMAGING — DX DG CHEST 1V PORT
1 series · 1 of 1 positions shown · non-contrast
Comparison: Included lung bases from abdominal CT earlier today.
Chest radiograph [id number]

CLINICAL DATA: Pancreatitis.  Heart murmur.

EXAM:
PORTABLE CHEST 1 VIEW

[chest ap]
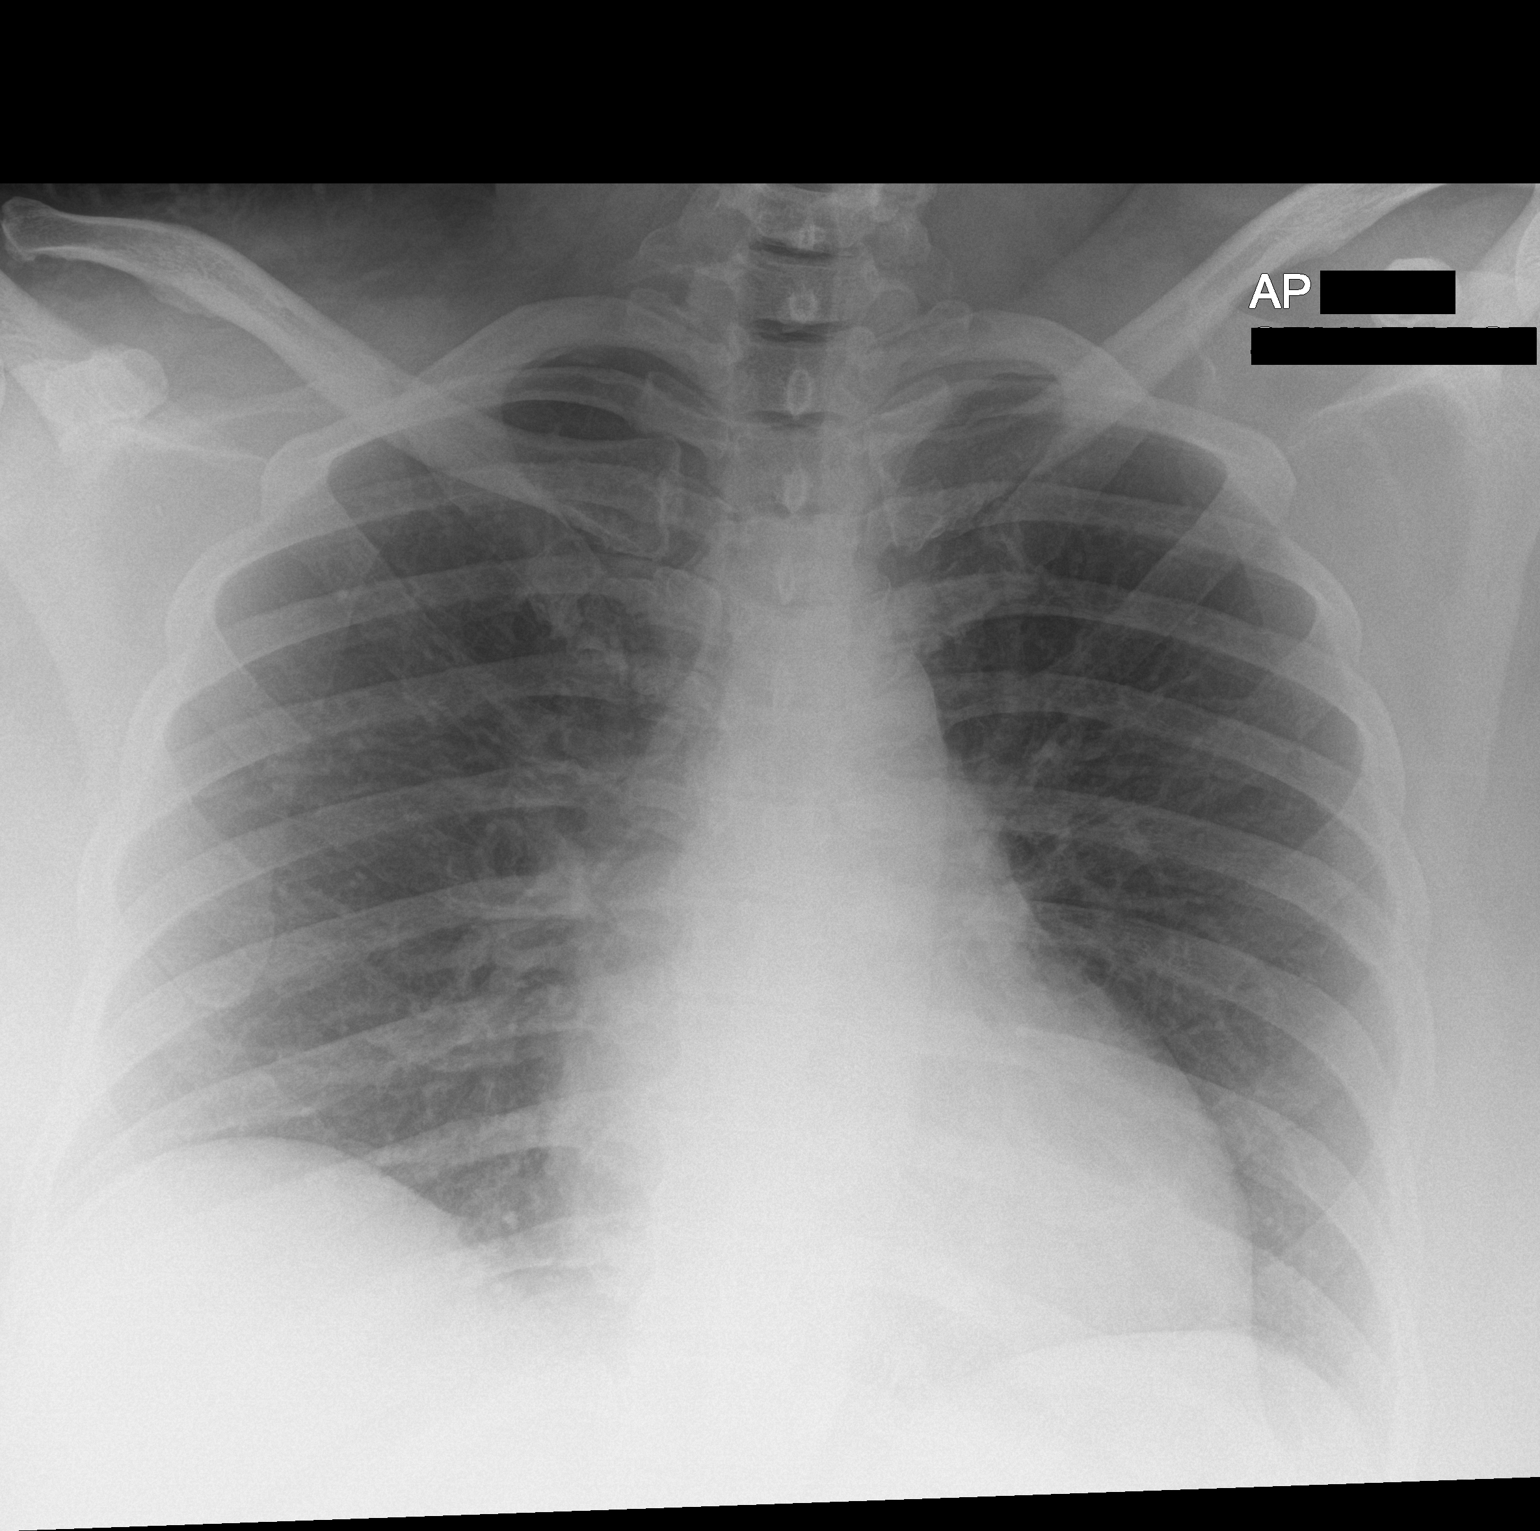

[1 of 1 positions shown; findings below may reference images not displayed]

FINDINGS: The cardiomediastinal contours are normal on this portable AP view.
The lungs are clear. Pulmonary vasculature is normal. No
consolidation, pleural effusion, or pneumothorax. No acute osseous
abnormalities are seen.
IMPRESSION: No acute chest findings.

## 2021-04-09 MED ORDER — GABAPENTIN 300 MG PO CAPS
300.0000 mg | ORAL_CAPSULE | ORAL | Status: AC
  Administered 2021-04-10: 300 mg via ORAL
  Filled 2021-04-09: qty 1

## 2021-04-09 MED ORDER — GENTAMICIN SULFATE 40 MG/ML IJ SOLN
480.0000 mg | INTRAVENOUS | Status: AC
  Administered 2021-04-10: 480 mg via INTRAVENOUS
  Filled 2021-04-09: qty 12

## 2021-04-09 MED ORDER — ATORVASTATIN CALCIUM 10 MG PO TABS
10.0000 mg | ORAL_TABLET | Freq: Every day | ORAL | Status: DC
  Administered 2021-04-09 – 2021-04-10 (×2): 10 mg via ORAL
  Filled 2021-04-09 (×2): qty 1

## 2021-04-09 MED ORDER — CHLORHEXIDINE GLUCONATE CLOTH 2 % EX PADS
6.0000 | MEDICATED_PAD | Freq: Once | CUTANEOUS | Status: AC
  Administered 2021-04-10: 6 via TOPICAL

## 2021-04-09 MED ORDER — ACETAMINOPHEN 500 MG PO TABS
1000.0000 mg | ORAL_TABLET | ORAL | Status: AC
  Administered 2021-04-10: 1000 mg via ORAL
  Filled 2021-04-09: qty 2

## 2021-04-09 MED ORDER — PROPRANOLOL HCL 40 MG PO TABS
40.0000 mg | ORAL_TABLET | Freq: Two times a day (BID) | ORAL | Status: DC
  Administered 2021-04-09 – 2021-04-11 (×4): 40 mg via ORAL
  Filled 2021-04-09: qty 1
  Filled 2021-04-09 (×2): qty 2
  Filled 2021-04-09: qty 1

## 2021-04-09 MED ORDER — CLINDAMYCIN PHOSPHATE 900 MG/50ML IV SOLN
900.0000 mg | INTRAVENOUS | Status: AC
  Administered 2021-04-10: 900 mg via INTRAVENOUS
  Filled 2021-04-09: qty 50

## 2021-04-09 MED ORDER — PANTOPRAZOLE SODIUM 40 MG PO TBEC
40.0000 mg | DELAYED_RELEASE_TABLET | Freq: Every day | ORAL | Status: DC
  Administered 2021-04-10 – 2021-04-11 (×2): 40 mg via ORAL
  Filled 2021-04-09 (×2): qty 1

## 2021-04-09 MED ORDER — LURASIDONE HCL 40 MG PO TABS
80.0000 mg | ORAL_TABLET | Freq: Every day | ORAL | Status: DC
  Administered 2021-04-09 – 2021-04-10 (×2): 80 mg via ORAL
  Filled 2021-04-09 (×2): qty 2

## 2021-04-09 MED ORDER — IOHEXOL 350 MG/ML SOLN
100.0000 mL | Freq: Once | INTRAVENOUS | Status: AC | PRN
  Administered 2021-04-09: 100 mL via INTRAVENOUS

## 2021-04-09 MED ORDER — INSULIN ASPART 100 UNIT/ML IJ SOLN
0.0000 [IU] | INTRAMUSCULAR | Status: DC
Start: 2021-04-09 — End: 2021-04-10
  Administered 2021-04-09: 15 [IU] via SUBCUTANEOUS
  Administered 2021-04-10: 5 [IU] via SUBCUTANEOUS
  Filled 2021-04-09: qty 0.15

## 2021-04-09 MED ORDER — INSULIN GLARGINE-YFGN 100 UNIT/ML ~~LOC~~ SOLN
15.0000 [IU] | Freq: Every day | SUBCUTANEOUS | Status: DC
  Administered 2021-04-09: 15 [IU] via SUBCUTANEOUS
  Filled 2021-04-09 (×2): qty 0.15

## 2021-04-09 MED ORDER — CHLORHEXIDINE GLUCONATE CLOTH 2 % EX PADS: 6.0000 | MEDICATED_PAD | Freq: Once | CUTANEOUS | Status: DC

## 2021-04-09 MED ORDER — METRONIDAZOLE 500 MG/100ML IV SOLN
500.0000 mg | Freq: Two times a day (BID) | INTRAVENOUS | Status: DC
  Administered 2021-04-09 – 2021-04-10 (×2): 500 mg via INTRAVENOUS
  Filled 2021-04-09 (×2): qty 100

## 2021-04-09 MED ORDER — LITHIUM CARBONATE ER 450 MG PO TBCR
900.0000 mg | EXTENDED_RELEASE_TABLET | Freq: Every day | ORAL | Status: DC
  Administered 2021-04-09 – 2021-04-10 (×2): 900 mg via ORAL
  Filled 2021-04-09 (×2): qty 2

## 2021-04-09 MED ORDER — INFLUENZA VAC SPLIT QUAD 0.5 ML IM SUSY
0.5000 mL | PREFILLED_SYRINGE | INTRAMUSCULAR | Status: AC
  Administered 2021-04-11: 0.5 mL via INTRAMUSCULAR
  Filled 2021-04-09: qty 0.5

## 2021-04-09 MED ORDER — LEVOTHYROXINE SODIUM 25 MCG PO TABS
137.0000 ug | ORAL_TABLET | Freq: Every day | ORAL | Status: DC
  Administered 2021-04-10 – 2021-04-11 (×2): 137 ug via ORAL
  Filled 2021-04-09 (×2): qty 1

## 2021-04-09 MED ORDER — CITALOPRAM HYDROBROMIDE 20 MG PO TABS
40.0000 mg | ORAL_TABLET | Freq: Every day | ORAL | Status: DC
  Administered 2021-04-10 – 2021-04-11 (×3): 40 mg via ORAL
  Filled 2021-04-09: qty 4
  Filled 2021-04-09 (×2): qty 2

## 2021-04-09 MED ORDER — HYDROCODONE-ACETAMINOPHEN 5-325 MG PO TABS
1.0000 | ORAL_TABLET | Freq: Four times a day (QID) | ORAL | Status: DC | PRN
  Administered 2021-04-09 – 2021-04-11 (×4): 1 via ORAL
  Filled 2021-04-09 (×4): qty 1

## 2021-04-09 MED ORDER — HYDROMORPHONE HCL 1 MG/ML IJ SOLN
0.5000 mg | INTRAMUSCULAR | Status: DC | PRN
Start: 2021-04-09 — End: 2021-04-10
  Administered 2021-04-09 – 2021-04-10 (×2): 0.5 mg via INTRAVENOUS
  Filled 2021-04-09: qty 1
  Filled 2021-04-09: qty 0.5

## 2021-04-09 MED ORDER — PRAZOSIN HCL 1 MG PO CAPS
1.0000 mg | ORAL_CAPSULE | Freq: Every day | ORAL | Status: DC
  Administered 2021-04-09 – 2021-04-10 (×2): 1 mg via ORAL
  Filled 2021-04-09 (×2): qty 1

## 2021-04-09 MED ORDER — CIPROFLOXACIN IN D5W 400 MG/200ML IV SOLN
400.0000 mg | Freq: Two times a day (BID) | INTRAVENOUS | Status: DC
  Administered 2021-04-09 – 2021-04-10 (×2): 400 mg via INTRAVENOUS
  Filled 2021-04-09 (×2): qty 200

## 2021-04-09 MED ORDER — ONDANSETRON HCL 4 MG/2ML IJ SOLN
4.0000 mg | Freq: Four times a day (QID) | INTRAMUSCULAR | Status: DC | PRN
  Administered 2021-04-09: 4 mg via INTRAVENOUS
  Filled 2021-04-09: qty 2

## 2021-04-09 MED ORDER — GABAPENTIN 300 MG PO CAPS: 300.0000 mg | ORAL_CAPSULE | Freq: Three times a day (TID) | ORAL | Status: DC | PRN

## 2021-04-09 NOTE — ED Triage Notes (Signed)
20G IV placed in RAC for CT scan with permission from Laurice Record, RN. Placed back in lobby. Instructed not to leave with IV in place. Pt verbalized understanding.

## 2021-04-09 NOTE — Assessment & Plan Note (Addendum)
BP controlled with propranolol this afternoon - Continue propanolol

## 2021-04-09 NOTE — Hospital Course (Addendum)
[name] is a 50 y.o. F with MO, PTSD, Bipolar depression, IDDM poorly controlled, and hypothyroidism who presented with 4 days RLQ abdominal pain, sweats and nausea.  In the ER, CT of the abdomen and pelvis showed uncomplicated appendicitis.  Pelvic ultrasound unremarkable. General surgery were consulted recommended appendectomy.  Her glucose was 351.   10/29: Successful appendectomy this morning, Glucoses still 200-300 this afternoon, and patient dizzy, not yet been able to tolerate solid diet

## 2021-04-09 NOTE — Assessment & Plan Note (Addendum)
Glucoses still 200s, 300s today.   - Lantus, increase dose -Hold actos -Continue statin -Continue SS corrections

## 2021-04-09 NOTE — ED Provider Notes (Signed)
Emergency Medicine Provider Triage Evaluation Note  Lynn Martinez , a 50 y.o. female  was evaluated in triage.  Pt complains of right lower quadrant pain progressively worsening for the last 4 days with associated nausea without fevers, chills, vomiting, bleeding or discharge.  Status post hysterectomy but does retain her ovaries.  Review of Systems  Positive: Right lower quadrant pain, nausea Negative: Chest pain, fevers, chills  Physical Exam  BP (!) 146/89 (BP Location: Left Arm)   Pulse 80   Temp 98.2 F (36.8 C) (Oral)   Resp 16   Ht 5\' 10"  (1.778 m)   Wt 136.1 kg   LMP 09/20/2011   SpO2 96%   BMI 43.05 kg/m  Gen:   Awake, no distress   Resp:  Normal effort  MSK:   Moves extremities without difficulty  Other:  RRR no M/R/G.  Lungs CTA B.  No CVA tenderness bilaterally.  Right lower quadrant, suprapubic, and periumbilical pain to palpation.  Positive Rovsing sign.  Medical Decision Making  Medically screening exam initiated at 11:30 AM.  Appropriate orders placed.  [name] [name] was informed that the remainder of the evaluation will be completed by another provider, this initial triage assessment does not replace that evaluation, and the importance of remaining in the ED until their evaluation is complete.  Will order transvaginal ultrasound to evaluate the ovaries given more medial location of her pain relative to McBurney's point.  Pending labs and ultrasound may require CT of the abdomen.  This chart was dictated using voice recognition software, Dragon. Despite the best efforts of this provider to proofread and correct errors, errors may still occur which can change documentation meaning.    Aura Dials 04/09/21 1131    Valarie Merino, MD 04/09/21 1331
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
## 2021-04-09 NOTE — Assessment & Plan Note (Addendum)
S/p appendectomy 04/10/2021 by Dr. Johney Maine - Stop antibiotics - Continue clears, advance to solid diet if able tonight or tomorrow

## 2021-04-09 NOTE — ED Provider Notes (Signed)
[facility] DEPT Provider Note   CSN: [id number] Arrival date & time: 04/09/21  1027     History Chief Complaint  Patient presents with   Abdominal Pain   Nausea    Lynn Martinez [name] is a 50 y.o. female.  HPI Patient presents with her husband who assists with the history. She presents with pain that has been present possibly for 4 days, worse over the past day.  Pain is right-sided, moderate, sore, persistent, not improved with anything.  There is associated anorexia, nausea, no reported vomiting, nor fever.    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   Migraine     otc med prn   PCOS (polycystic ovarian syndrome)    PMS (premenstrual syndrome)    PTSD (post-traumatic stress disorder)    Seasonal allergies    Thyroid cancer ([place]) 2005   PAST HX   Thyroid disease    Type II diabetes mellitus ([place])    Vertigo     Patient Active Problem List   Diagnosis Date Noted   Bipolar 1 disorder, depressed, full remission ([place]) 10/19/2020   Overactive bladder 08/20/2020   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   Heme positive stool 07/15/2020   Morbid obesity with BMI of 40.0-44.9, adult ([place]) 07/09/2020   Bipolar 1 disorder, depressed, moderate ([place]) 02/10/2020   Breast pain, left 06/11/2019   Radiculopathy, cervical region 11/21/2018   Carpal tunnel syndrome, right upper limb 10/08/2018   Carpal tunnel syndrome, left upper limb 10/08/2018   Hypertension 09/30/2015   Type 2 diabetes mellitus with hyperglycemia, with  long-term current use of insulin ([place]) 09/30/2015   S/P cholecystectomy 09/05/2013   Borderline behavior 03/14/2012    Class: Chronic   OCD (obsessive compulsive disorder) 03/14/2012    Class: Chronic   PTSD (post-traumatic stress disorder) 03/14/2012    Class: Chronic   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   ABDOMINAL HYSTERECTOMY     CHOLECYSTECTOMY N/A 09/06/2013   Procedure: LAPAROSCOPIC CHOLECYSTECTOMY WITH INTRAOPERATIVE CHOLANGIOGRAM;  Surgeon: Merrie Roof, MD;  Location: [facility];  Service: General;  Laterality: N/A;   COLONOSCOPY     DIAGNOSTIC LAPAROSCOPY     DILATION AND CURETTAGE OF UTERUS     HERNIA REPAIR     MYOMECTOMY  06/13/2008   LAPAROSCOPY   thyroid removed     TOTAL THYROIDECTOMY  26/71/2458   UMBILICAL HERNIA REPAIR  06/13/1980     OB History     Gravida  2   Para  0   Term  0   Preterm  0   AB  2   Living         SAB  2   IAB  0   Ectopic  0   Multiple      Live Births  0           Family History  Problem Relation Age of Onset   Thyroid disease Mother    Colon polyps Father    Diabetes Father    Hypertension Father    Heart disease Father    Heart attack Father    Cancer Maternal Aunt        BRAIN   Stomach cancer Maternal Grandmother    Colon cancer Maternal Grandmother    Cancer Maternal Grandmother        LYMPHOMA   Hypertension Maternal Grandmother    Colon cancer Maternal  Grandfather    Hypertension Maternal Grandfather    Diabetes Maternal Grandfather    Cancer Paternal Grandmother        PANCREATIC   Diabetes Paternal Grandmother    Hypertension Paternal Grandmother    Cancer Paternal Grandfather    Hypertension Paternal Grandfather    Pancreatic cancer Paternal Grandfather    Esophageal cancer Neg Hx    Rectal cancer Neg Hx     Social History   Tobacco Use   Smoking status: Never   Smokeless tobacco: Never  Vaping Use   Vaping Use: Never used  Substance Use Topics   Alcohol use: Never   Drug use: Never    Home Medications Prior to Admission medications   Medication Sig Start Date End Date Taking? Authorizing Provider  atorvastatin (LIPITOR) 10 MG tablet TAKE 1 TABLET (10 MG TOTAL) BY MOUTH DAILY. TO LOWER CHOLESTEROL 01/19/21 01/19/22  Elsie Stain, MD  citalopram (CELEXA) 40 MG tablet Take 1 tablet (40 mg total) by mouth daily. 03/17/21   Salley Slaughter, NP  clindamycin-benzoyl peroxide (BENZACLIN) gel Apply topically 2 (two) times daily. 01/19/21 01/19/22  Elsie Stain, MD  Continuous Blood Gluc Transmit (DEXCOM G6 TRANSMITTER) MISC 1 Device by Does not apply route as directed. 08/17/20   Shamleffer, Melanie Crazier, MD  gabapentin (NEURONTIN) 300 MG capsule TAKE 2 CAPSULES (600 MG TOTAL) BY MOUTH DAILY. 01/19/21 01/19/22  Elsie Stain, MD  hydrOXYzine (ATARAX/VISTARIL) 25 MG tablet TAKE 1 TABLET (25 MG TOTAL) BY MOUTH 2 (TWO) TIMES DAILY. 03/17/21   Salley Slaughter, NP  Insulin Disposable Pump (OMNIPOD DASH 5 PACK PODS) MISC USE 1 DEVICE AS DIRECTED. 08/12/20 08/12/21  Shamleffer, Melanie Crazier, MD  insulin lispro (HUMALOG) 100 UNIT/ML injection MAX DAILY 100 UNITS PER PUMP USE 08/21/20 08/21/21  Shamleffer, Melanie Crazier, MD  Insulin Pen Needle 32G X 4 MM MISC USE AS INSTRUCTED TO INJECT INSULIN. 07/09/20 07/09/21  Elsie Stain, MD  levothyroxine (SYNTHROID) 137 MCG tablet TAKE 1 CAPSULE (137 MCG TOTAL) BY MOUTH DAILY BEFORE  BREAKFAST. 01/19/21 01/19/22  Elsie Stain, MD  lithium carbonate (ESKALITH) 450 MG CR tablet TAKE 2 CAPSULES AT BEDTIME 03/17/21   Eulis Canner E, NP  lurasidone (LATUDA) 80 MG TABS tablet TAKE 1 TABLET (80 MG TOTAL) BY MOUTH AT BEDTIME. 03/17/21   Eulis Canner E, NP  methocarbamol (ROBAXIN) 500 MG tablet TAKE 1 TABLET (500 MG TOTAL) BY MOUTH 3 (THREE) TIMES DAILY AS NEEDED FOR  MUSCLE SPASMS. 01/19/21 01/19/22  Elsie Stain, MD  omeprazole (PRILOSEC) 40 MG capsule Take 1 capsule (40 mg total) by mouth daily. 01/19/21 01/19/22  Elsie Stain, MD  prazosin (MINIPRESS) 1 MG capsule TAKE 1 CAPSULE (1 MG TOTAL) BY MOUTH AT BEDTIME. FOR SLEEP/NIGHTMARES 03/17/21   Eulis Canner E, NP  propranolol (INDERAL) 40 MG tablet TAKE 1 TABLET (40 MG TOTAL) BY MOUTH 2 (TWO) TIMES DAILY. 01/19/21 01/19/22  Elsie Stain, MD  Vitamin D, Ergocalciferol, (DRISDOL) 1.25 MG (50000 UNIT) CAPS capsule TAKE 1 CAPSULE (50,000 UNITS TOTAL) BY MOUTH EVERY 7 (SEVEN) DAYS. 01/19/21 01/19/22  Elsie Stain, MD  insulin aspart (NOVOLOG) 100 UNIT/ML injection Max daily 100 units per pump 08/17/20 08/21/20  Shamleffer, Melanie Crazier, MD    Allergies    Bee venom, Morphine, Codeine, Golytely [peg 3350-electrolytes], Ibuprofen, Ibuprofen, Metformin and related, Penicillins, Tape, and Aspirin  Review of Systems   Review of Systems  Constitutional:        Per HPI, otherwise negative  HENT:         Per HPI, otherwise negative  Respiratory:         Per HPI, otherwise negative  Cardiovascular:        Per HPI, otherwise negative  Gastrointestinal:  Negative for vomiting.  Endocrine:       Negative aside from HPI  Genitourinary:        Neg aside from HPI   Musculoskeletal:        Per HPI, otherwise negative  Skin: Negative.   Neurological:  Negative for syncope.   Physical Exam Updated Vital Signs BP (!) 146/89 (BP Location: Left Arm)   Pulse 80   Temp 98.2 F (36.8 C) (Oral)   Resp 16   Ht 5\' 10"  (1.778  m)   Wt 136.1 kg   LMP 09/20/2011   SpO2 96%   BMI 43.05 kg/m   Physical Exam Vitals and nursing note reviewed.  Constitutional:      General: She is not in acute distress.    Appearance: She is well-developed. She is obese. She is not ill-appearing.  HENT:     Head: Normocephalic and atraumatic.  Eyes:     Conjunctiva/sclera: Conjunctivae normal.  Cardiovascular:     Rate and Rhythm: Normal rate and regular rhythm.  Pulmonary:     Effort: Pulmonary effort is normal. No respiratory distress.     Breath sounds: Normal breath sounds. No stridor.  Abdominal:     General: There is no distension.     Tenderness: There is abdominal tenderness in the right lower quadrant.  Skin:    General: Skin is warm and dry.  Neurological:     Mental Status: She is alert and oriented to person, place, and time.     Cranial Nerves: No cranial nerve deficit.    ED Results / Procedures / Treatments   Labs (all labs ordered are listed, but only abnormal results are displayed) Labs Reviewed  COMPREHENSIVE METABOLIC PANEL - Abnormal; Notable for the following components:      Result Value   Sodium 134 (*)    Glucose, Bld 351 (*)    All other components within normal limits  CBC - Abnormal; Notable for the following components:   Platelets 460 (*)    All other components within normal limits  URINALYSIS, ROUTINE W REFLEX MICROSCOPIC - Abnormal; Notable for the following components:   Glucose, UA >=500 (*)    Ketones, ur 20 (*)  Bacteria, UA RARE (*)    All other components within normal limits  RESP PANEL BY RT-PCR (FLU A&B, COVID) ARPGX2  LIPASE, BLOOD    EKG None  Radiology CT Abdomen Pelvis W Contrast  Result Date: 04/09/2021 CLINICAL DATA:  Right lower quadrant abdominal pain. EXAM: CT ABDOMEN AND PELVIS WITH CONTRAST TECHNIQUE: Multidetector CT imaging of the abdomen and pelvis was performed using the standard protocol following bolus administration of intravenous contrast.  CONTRAST:  14mL OMNIPAQUE IOHEXOL 350 MG/ML SOLN COMPARISON:  CT February 21, 2018 FINDINGS: Lower chest: No acute abnormality. Hepatobiliary: Diffuse hepatic steatosis. Gallbladder surgically absent. No biliary ductal dilation. Pancreas: No pancreatic ductal dilation or evidence of acute inflammation. Spleen: Within normal limits. Adrenals/Urinary Tract: Bilateral adrenal glands are unremarkable. No hydronephrosis. Possible punctate nonobstructive right renal stones. Urinary bladder is unremarkable for degree of distension. Stomach/Bowel: No enteric contrast was administered. Stomach is unremarkable for degree of distension. No pathologic dilation of small or large bowel. Inflamed retrocecal appendix overlying the right psoas muscle measuring up to 12 mm with adjacent inflammatory stranding. Vascular/Lymphatic: No abdominal aortic aneurysm. Prominent right lower quadrant lymph nodes measuring up to 4 mm, likely reactive. No pathologically enlarged abdominopelvic lymph nodes. Reproductive: Status post hysterectomy. No adnexal masses. Other: No significant abdominopelvic ascites. No pneumoperitoneum. No walled off fluid collections. Musculoskeletal: No acute or significant osseous findings. IMPRESSION: 1. Acute uncomplicated appendicitis. 2. Diffuse hepatic steatosis. 3. Possible punctate nonobstructive right renal stones. Electronically Signed   By: Dahlia Bailiff M.D.   On: 04/09/2021 15:28   US PELVIC COMPLETE W TRANSVAGINAL AND TORSION R/O  Result Date: 04/09/2021 CLINICAL DATA:  RIGHT lower quadrant pain for 4 days, intense EXAM: TRANSABDOMINAL AND TRANSVAGINAL ULTRASOUND OF PELVIS DOPPLER ULTRASOUND OF OVARIES TECHNIQUE: Both transabdominal and transvaginal ultrasound examinations of the pelvis were performed. Transabdominal technique was performed for global imaging of the pelvis including uterus, ovaries, adnexal regions, and pelvic cul-de-sac. It was necessary to proceed with endovaginal exam  following the transabdominal exam to visualize the ovaries and adnexa. Color and duplex Doppler ultrasound was utilized to evaluate blood flow to the ovaries. COMPARISON:  05/22/2015 FINDINGS: Uterus Surgically absent Endometrium Surgically absent Right ovary Measurements: 3.6 x 2.1 x 1.5 cm = volume: 5.7 mL. Normal morphology without mass. Internal blood flow present on color Doppler imaging. Left ovary Not visualized, likely obscured by bowel Pulsed Doppler evaluation of both ovaries demonstrates normal low-resistance arterial and venous waveforms. Other findings No free pelvic fluid or adnexal masses. IMPRESSION: Surgical absence of uterus with nonvisualization of LEFT ovary. Normal appearing RIGHT ovary. No intrapelvic abnormalities identified. Electronically Signed   By: Lavonia Dana M.D.   On: 04/09/2021 12:53    Procedures Procedures   Medications Ordered in ED Medications  iohexol (OMNIPAQUE) 350 MG/ML injection 100 mL (100 mLs Intravenous Contrast Given 04/09/21 1511)    ED Course  I have reviewed the triage vital signs and the nursing notes.  Pertinent labs & imaging results that were available during my care of the patient were reviewed by me and considered in my medical decision making (see chart for details).  Prior to my evaluation patient had ultrasound performed following triage, medical screening exam.  These results were reassuring.  CT scan, however, notable for acute appendicitis, uncomplicated.4:26 PM Patient, husband aware of all findings, including indication for surgical consult.  I discussed her case with our surgical colleagues.  She is awake, alert, sitting upright, hemodynamically unremarkable, and with findings as above concerning for acute  appendicitis, patient will require admission for anticipated monitoring, management, intervention. MDM Rules/Calculators/A&P MDM Number of Diagnoses or Management Options Acute appendicitis with localized peritonitis, without  perforation, abscess, or gangrene: new, needed workup Pain, abdominal, RLQ: new, needed workup   Amount and/or Complexity of Data Reviewed Clinical lab tests: reviewed and ordered Tests in the radiology section of CPT: reviewed Tests in the medicine section of CPT: reviewed and ordered Decide to obtain previous medical records or to obtain history from someone other than the patient: yes Obtain history from someone other than the patient: yes Review and summarize past medical records: yes Discuss the patient with other providers: yes Independent visualization of images, tracings, or specimens: yes  Risk of Complications, Morbidity, and/or Mortality Presenting problems: high Diagnostic procedures: high Management options: high  Critical Care Total time providing critical care: < 30 minutes  Patient Progress Patient progress: stable   Final Clinical Impression(s) / ED Diagnoses Final diagnoses:  Acute appendicitis with localized peritonitis, without perforation, abscess, or gangrene     Carmin Muskrat, MD 04/09/21 1629

## 2021-04-09 NOTE — ED Triage Notes (Signed)
Patient reports that she began having RLQ abdominal pain with nausea x 4 days.
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
## 2021-04-09 NOTE — Assessment & Plan Note (Signed)
-   Continue prazosin

## 2021-04-09 NOTE — Assessment & Plan Note (Signed)
-   Continue Latuda, lithium

## 2021-04-09 NOTE — Assessment & Plan Note (Signed)
BMI 43 

## 2021-04-09 NOTE — Assessment & Plan Note (Signed)
Continue levothyroxine 

## 2021-04-10 ENCOUNTER — Encounter (HOSPITAL_COMMUNITY)
Admission: EM | Disposition: A | Discharge status: Home / Self Care | Payer: Self-pay | Source: Home / Self Care | Attending: Family Medicine

## 2021-04-10 ENCOUNTER — Encounter (HOSPITAL_COMMUNITY): Payer: Self-pay | Admitting: Family Medicine

## 2021-04-10 ENCOUNTER — Inpatient Hospital Stay (HOSPITAL_COMMUNITY): Payer: Self-pay | Admitting: Anesthesiology

## 2021-04-10 HISTORY — PX: LAPAROSCOPIC APPENDECTOMY: SHX408

## 2021-04-10 LAB — GLUCOSE, CAPILLARY
Glucose-Capillary: 222 mg/dL — ABNORMAL HIGH (ref 70–99)
Glucose-Capillary: 92 mg/dL (ref 70–99)
Glucose-Capillary: 253 mg/dL — ABNORMAL HIGH (ref 70–99)
Glucose-Capillary: 256 mg/dL — ABNORMAL HIGH (ref 70–99)
Glucose-Capillary: 263 mg/dL — ABNORMAL HIGH (ref 70–99)
Glucose-Capillary: 276 mg/dL — ABNORMAL HIGH (ref 70–99)
Glucose-Capillary: 244 mg/dL — ABNORMAL HIGH (ref 70–99)
Glucose-Capillary: 264 mg/dL — ABNORMAL HIGH (ref 70–99)
Glucose-Capillary: 299 mg/dL — ABNORMAL HIGH (ref 70–99)

## 2021-04-10 LAB — HEMOGLOBIN A1C
Hgb A1c MFr Bld: 13 % — ABNORMAL HIGH (ref 4.8–5.6)
Mean Plasma Glucose: 326.4 mg/dL

## 2021-04-10 SURGERY — APPENDECTOMY, LAPAROSCOPIC
Anesthesia: General

## 2021-04-10 MED ORDER — SODIUM CHLORIDE (PF) 0.9 % IJ SOLN
INTRAMUSCULAR | Status: AC
  Filled 2021-04-10: qty 20

## 2021-04-10 MED ORDER — BUPIVACAINE-EPINEPHRINE 0.25% -1:200000 IJ SOLN
INTRAMUSCULAR | Status: DC | PRN
  Administered 2021-04-10: 30 mL

## 2021-04-10 MED ORDER — INSULIN ASPART 100 UNIT/ML IJ SOLN
0.0000 [IU] | Freq: Every day | INTRAMUSCULAR | Status: DC
  Administered 2021-04-10: 3 [IU] via SUBCUTANEOUS

## 2021-04-10 MED ORDER — PHENYLEPHRINE HCL (PRESSORS) 10 MG/ML IV SOLN
INTRAVENOUS | Status: AC
  Filled 2021-04-10: qty 2

## 2021-04-10 MED ORDER — MIDAZOLAM HCL 2 MG/2ML IJ SOLN
INTRAMUSCULAR | Status: AC
  Filled 2021-04-10: qty 2

## 2021-04-10 MED ORDER — INSULIN ASPART 100 UNIT/ML IJ SOLN
INTRAMUSCULAR | Status: AC
  Administered 2021-04-10: 5 [IU] via SUBCUTANEOUS
  Filled 2021-04-10: qty 1

## 2021-04-10 MED ORDER — CALCIUM POLYCARBOPHIL 625 MG PO TABS
625.0000 mg | ORAL_TABLET | Freq: Two times a day (BID) | ORAL | Status: DC
  Administered 2021-04-10 – 2021-04-11 (×2): 625 mg via ORAL
  Filled 2021-04-10 (×2): qty 1

## 2021-04-10 MED ORDER — SUGAMMADEX SODIUM 500 MG/5ML IV SOLN
INTRAVENOUS | Status: DC | PRN
  Administered 2021-04-10: 300 mg via INTRAVENOUS

## 2021-04-10 MED ORDER — INSULIN ASPART 100 UNIT/ML IJ SOLN
6.0000 [IU] | Freq: Once | INTRAMUSCULAR | Status: AC
  Administered 2021-04-10: 6 [IU] via SUBCUTANEOUS

## 2021-04-10 MED ORDER — FENTANYL CITRATE (PF) 250 MCG/5ML IJ SOLN
INTRAMUSCULAR | Status: DC | PRN
  Administered 2021-04-10: 50 ug via INTRAVENOUS
  Administered 2021-04-10: 100 ug via INTRAVENOUS

## 2021-04-10 MED ORDER — BUPIVACAINE LIPOSOME 1.3 % IJ SUSP
INTRAMUSCULAR | Status: AC
  Filled 2021-04-10: qty 20

## 2021-04-10 MED ORDER — PROPOFOL 10 MG/ML IV BOLUS
INTRAVENOUS | Status: DC | PRN
  Administered 2021-04-10: 200 mg via INTRAVENOUS

## 2021-04-10 MED ORDER — 0.9 % SODIUM CHLORIDE (POUR BTL) OPTIME
TOPICAL | Status: DC | PRN
  Administered 2021-04-10: 1000 mL

## 2021-04-10 MED ORDER — SODIUM CHLORIDE 0.9% FLUSH: 3.0000 mL | INTRAVENOUS | Status: DC | PRN

## 2021-04-10 MED ORDER — ACETAMINOPHEN 500 MG PO TABS
1000.0000 mg | ORAL_TABLET | Freq: Four times a day (QID) | ORAL | Status: DC
  Administered 2021-04-10: 1000 mg via ORAL
  Filled 2021-04-10: qty 2

## 2021-04-10 MED ORDER — PROPOFOL 10 MG/ML IV BOLUS
INTRAVENOUS | Status: AC
  Filled 2021-04-10: qty 20

## 2021-04-10 MED ORDER — ROCURONIUM BROMIDE 10 MG/ML (PF) SYRINGE
PREFILLED_SYRINGE | INTRAVENOUS | Status: DC | PRN
  Administered 2021-04-10: 40 mg via INTRAVENOUS
  Administered 2021-04-10: 20 mg via INTRAVENOUS

## 2021-04-10 MED ORDER — EPHEDRINE 5 MG/ML INJ
INTRAVENOUS | Status: AC
  Filled 2021-04-10: qty 5

## 2021-04-10 MED ORDER — DEXAMETHASONE SODIUM PHOSPHATE 10 MG/ML IJ SOLN
INTRAMUSCULAR | Status: AC
  Filled 2021-04-10: qty 1

## 2021-04-10 MED ORDER — ALUM & MAG HYDROXIDE-SIMETH 200-200-20 MG/5ML PO SUSP
30.0000 mL | ORAL | Status: DC | PRN
  Administered 2021-04-10: 30 mL via ORAL
  Filled 2021-04-10: qty 30

## 2021-04-10 MED ORDER — FENTANYL CITRATE PF 50 MCG/ML IJ SOSY
25.0000 ug | PREFILLED_SYRINGE | INTRAMUSCULAR | Status: DC | PRN
  Administered 2021-04-10 (×3): 50 ug via INTRAVENOUS

## 2021-04-10 MED ORDER — TRAMADOL HCL 50 MG PO TABS
50.0000 mg | ORAL_TABLET | Freq: Four times a day (QID) | ORAL | Status: DC | PRN
  Administered 2021-04-10 – 2021-04-11 (×3): 100 mg via ORAL
  Filled 2021-04-10 (×3): qty 2

## 2021-04-10 MED ORDER — SUCCINYLCHOLINE CHLORIDE 200 MG/10ML IV SOSY
PREFILLED_SYRINGE | INTRAVENOUS | Status: AC
  Filled 2021-04-10: qty 10

## 2021-04-10 MED ORDER — SODIUM CHLORIDE 0.9 % IV SOLN: 250.0000 mL | INTRAVENOUS | Status: DC | PRN

## 2021-04-10 MED ORDER — BUPIVACAINE-EPINEPHRINE (PF) 0.25% -1:200000 IJ SOLN
INTRAMUSCULAR | Status: AC
  Filled 2021-04-10: qty 60

## 2021-04-10 MED ORDER — SUCCINYLCHOLINE CHLORIDE 200 MG/10ML IV SOSY
PREFILLED_SYRINGE | INTRAVENOUS | Status: DC | PRN
  Administered 2021-04-10: 200 mg via INTRAVENOUS

## 2021-04-10 MED ORDER — DEXTROSE 5 % IV SOLN
1000.0000 mg | Freq: Four times a day (QID) | INTRAVENOUS | Status: DC | PRN
Start: 2021-04-10 — End: 2021-04-10
  Filled 2021-04-10: qty 10

## 2021-04-10 MED ORDER — PROCHLORPERAZINE EDISYLATE 10 MG/2ML IJ SOLN: 5.0000 mg | INTRAMUSCULAR | Status: DC | PRN

## 2021-04-10 MED ORDER — PHENYLEPHRINE HCL-NACL 20-0.9 MG/250ML-% IV SOLN
INTRAVENOUS | Status: DC | PRN
  Administered 2021-04-10: 50 ug/min via INTRAVENOUS

## 2021-04-10 MED ORDER — PHENYLEPHRINE 40 MCG/ML (10ML) SYRINGE FOR IV PUSH (FOR BLOOD PRESSURE SUPPORT)
PREFILLED_SYRINGE | INTRAVENOUS | Status: AC
  Filled 2021-04-10: qty 20

## 2021-04-10 MED ORDER — LACTATED RINGERS IR SOLN
Status: DC | PRN
  Administered 2021-04-10: 1000 mL

## 2021-04-10 MED ORDER — TRAMADOL HCL 50 MG PO TABS: 50.0000 mg | ORAL_TABLET | Freq: Four times a day (QID) | ORAL | 0 refills | Status: DC | PRN

## 2021-04-10 MED ORDER — INSULIN GLARGINE-YFGN 100 UNIT/ML ~~LOC~~ SOLN
25.0000 [IU] | Freq: Every day | SUBCUTANEOUS | Status: DC
  Administered 2021-04-10: 25 [IU] via SUBCUTANEOUS
  Filled 2021-04-10: qty 0.25

## 2021-04-10 MED ORDER — DEXAMETHASONE SODIUM PHOSPHATE 10 MG/ML IJ SOLN
INTRAMUSCULAR | Status: DC | PRN
  Administered 2021-04-10: 10 mg via INTRAVENOUS

## 2021-04-10 MED ORDER — ACETAMINOPHEN 325 MG PO TABS: 650.0000 mg | ORAL_TABLET | Freq: Four times a day (QID) | ORAL | Status: DC | PRN

## 2021-04-10 MED ORDER — METHOCARBAMOL 500 MG PO TABS
1000.0000 mg | ORAL_TABLET | Freq: Four times a day (QID) | ORAL | Status: DC | PRN
  Administered 2021-04-10 – 2021-04-11 (×2): 1000 mg via ORAL
  Filled 2021-04-10 (×2): qty 2

## 2021-04-10 MED ORDER — INSULIN ASPART 100 UNIT/ML IJ SOLN
INTRAMUSCULAR | Status: AC
  Filled 2021-04-10: qty 1

## 2021-04-10 MED ORDER — INSULIN ASPART 100 UNIT/ML IJ SOLN
0.0000 [IU] | Freq: Three times a day (TID) | INTRAMUSCULAR | Status: DC
  Administered 2021-04-10: 8 [IU] via SUBCUTANEOUS
  Administered 2021-04-11: 11 [IU] via SUBCUTANEOUS
  Administered 2021-04-11: 3 [IU] via SUBCUTANEOUS

## 2021-04-10 MED ORDER — LIDOCAINE 2% (20 MG/ML) 5 ML SYRINGE
INTRAMUSCULAR | Status: DC | PRN
  Administered 2021-04-10: 100 mg via INTRAVENOUS

## 2021-04-10 MED ORDER — FENTANYL CITRATE (PF) 250 MCG/5ML IJ SOLN
INTRAMUSCULAR | Status: AC
  Filled 2021-04-10: qty 5

## 2021-04-10 MED ORDER — DIPHENHYDRAMINE HCL 50 MG/ML IJ SOLN: 12.5000 mg | Freq: Four times a day (QID) | INTRAMUSCULAR | Status: DC | PRN

## 2021-04-10 MED ORDER — LACTATED RINGERS IV BOLUS: 1000.0000 mL | Freq: Three times a day (TID) | INTRAVENOUS | Status: DC | PRN

## 2021-04-10 MED ORDER — SUGAMMADEX SODIUM 500 MG/5ML IV SOLN
INTRAVENOUS | Status: AC
  Filled 2021-04-10: qty 5

## 2021-04-10 MED ORDER — ONDANSETRON HCL 4 MG PO TABS: 4.0000 mg | ORAL_TABLET | Freq: Three times a day (TID) | ORAL | 2 refills | Status: DC | PRN

## 2021-04-10 MED ORDER — EPHEDRINE SULFATE-NACL 50-0.9 MG/10ML-% IV SOSY
PREFILLED_SYRINGE | INTRAVENOUS | Status: DC | PRN
  Administered 2021-04-10: 15 mg via INTRAVENOUS

## 2021-04-10 MED ORDER — LACTATED RINGERS IV SOLN: INTRAVENOUS | Status: DC | PRN

## 2021-04-10 MED ORDER — BUPIVACAINE LIPOSOME 1.3 % IJ SUSP
INTRAMUSCULAR | Status: DC | PRN
  Administered 2021-04-10: 20 mL

## 2021-04-10 MED ORDER — ROCURONIUM BROMIDE 10 MG/ML (PF) SYRINGE
PREFILLED_SYRINGE | INTRAVENOUS | Status: AC
  Filled 2021-04-10: qty 10

## 2021-04-10 MED ORDER — STERILE WATER FOR IRRIGATION IR SOLN
Status: DC | PRN
  Administered 2021-04-10: 1000 mL

## 2021-04-10 MED ORDER — MIDAZOLAM HCL 2 MG/2ML IJ SOLN
INTRAMUSCULAR | Status: DC | PRN
  Administered 2021-04-10: 2 mg via INTRAVENOUS

## 2021-04-10 MED ORDER — ONDANSETRON HCL 4 MG/2ML IJ SOLN
INTRAMUSCULAR | Status: DC | PRN
  Administered 2021-04-10: 4 mg via INTRAVENOUS

## 2021-04-10 MED ORDER — PHENYLEPHRINE 40 MCG/ML (10ML) SYRINGE FOR IV PUSH (FOR BLOOD PRESSURE SUPPORT)
PREFILLED_SYRINGE | INTRAVENOUS | Status: DC | PRN
  Administered 2021-04-10 (×2): 200 ug via INTRAVENOUS

## 2021-04-10 MED ORDER — MAGIC MOUTHWASH
15.0000 mL | Freq: Four times a day (QID) | ORAL | Status: DC | PRN
  Filled 2021-04-10: qty 15

## 2021-04-10 MED ORDER — ONDANSETRON HCL 4 MG/2ML IJ SOLN
INTRAMUSCULAR | Status: AC
  Filled 2021-04-10: qty 2

## 2021-04-10 MED ORDER — SODIUM CHLORIDE 0.9% FLUSH
3.0000 mL | Freq: Two times a day (BID) | INTRAVENOUS | Status: DC
  Administered 2021-04-10 – 2021-04-11 (×2): 3 mL via INTRAVENOUS

## 2021-04-10 MED ORDER — FENTANYL CITRATE PF 50 MCG/ML IJ SOSY
PREFILLED_SYRINGE | INTRAMUSCULAR | Status: AC
  Filled 2021-04-10: qty 3

## 2021-04-10 SURGICAL SUPPLY — 60 items
ADH SKN CLS APL DERMABOND .7 (GAUZE/BANDAGES/DRESSINGS) ×1
APL PRP STRL LF DISP 70% ISPRP (MISCELLANEOUS) ×1
APPLIER CLIP 5 13 M/L LIGAMAX5 (MISCELLANEOUS)
APPLIER CLIP ROT 10 11.4 M/L (STAPLE)
APR CLP MED LRG 11.4X10 (STAPLE)
APR CLP MED LRG 5 ANG JAW (MISCELLANEOUS)
BAG COUNTER SPONGE SURGICOUNT (BAG) IMPLANT
BAG SPEC RTRVL 10 TROC 200 (ENDOMECHANICALS) ×1
BAG SPNG CNTER NS LX DISP (BAG)
BAG SURGICOUNT SPONGE COUNTING (BAG)
CABLE HIGH FREQUENCY MONO STRZ (ELECTRODE) ×3 IMPLANT
CHLORAPREP W/TINT 26 (MISCELLANEOUS) ×3 IMPLANT
CLIP APPLIE 5 13 M/L LIGAMAX5 (MISCELLANEOUS) IMPLANT
CLIP APPLIE ROT 10 11.4 M/L (STAPLE) IMPLANT
COVER SURGICAL LIGHT HANDLE (MISCELLANEOUS) ×3 IMPLANT
DECANTER SPIKE VIAL GLASS SM (MISCELLANEOUS) ×3 IMPLANT
DERMABOND ADVANCED (GAUZE/BANDAGES/DRESSINGS) ×2
DERMABOND ADVANCED .7 DNX12 (GAUZE/BANDAGES/DRESSINGS) IMPLANT
DEVICE TROCAR PUNCTURE CLOSURE (ENDOMECHANICALS) IMPLANT
DRAPE LAPAROSCOPIC ABDOMINAL (DRAPES) ×3 IMPLANT
DRAPE WARM FLUID 44X44 (DRAPES) ×3 IMPLANT
DRSG TEGADERM 2-3/8X2-3/4 SM (GAUZE/BANDAGES/DRESSINGS) ×2 IMPLANT
DRSG TEGADERM 4X4.75 (GAUZE/BANDAGES/DRESSINGS) ×1 IMPLANT
ELECT REM PT RETURN 15FT ADLT (MISCELLANEOUS) ×3 IMPLANT
ENDOLOOP SUT PDS II  0 18 (SUTURE)
ENDOLOOP SUT PDS II 0 18 (SUTURE) IMPLANT
GAUZE SPONGE 2X2 8PLY STRL LF (GAUZE/BANDAGES/DRESSINGS) ×1 IMPLANT
GLOVE SURG NEOPR MICRO LF SZ8 (GLOVE) ×3 IMPLANT
GLOVE SURG UNDER LTX SZ8 (GLOVE) ×3 IMPLANT
GOWN STRL REUS W/TWL XL LVL3 (GOWN DISPOSABLE) ×8 IMPLANT
IRRIG SUCT STRYKERFLOW 2 WTIP (MISCELLANEOUS) ×3
IRRIGATION SUCT STRKRFLW 2 WTP (MISCELLANEOUS) ×1 IMPLANT
KIT BASIN OR (CUSTOM PROCEDURE TRAY) ×3 IMPLANT
KIT TURNOVER KIT A (KITS) IMPLANT
PAD POSITIONING PINK XL (MISCELLANEOUS) ×3 IMPLANT
PENCIL SMOKE EVACUATOR (MISCELLANEOUS) IMPLANT
POUCH RETRIEVAL ECOSAC 10 (ENDOMECHANICALS) ×1 IMPLANT
POUCH RETRIEVAL ECOSAC 10MM (ENDOMECHANICALS) ×3
RELOAD STAPLE 60 3.6 BLU REG (STAPLE) IMPLANT
RELOAD STAPLE 60 4.1 GRN THCK (STAPLE) IMPLANT
RELOAD STAPLER BLUE 60MM (STAPLE) ×1 IMPLANT
RELOAD STAPLER GREEN 60MM (STAPLE) IMPLANT
SCISSORS LAP 5X35 DISP (ENDOMECHANICALS) ×3 IMPLANT
SET TUBE SMOKE EVAC HIGH FLOW (TUBING) ×3 IMPLANT
SHEARS HARMONIC ACE PLUS 36CM (ENDOMECHANICALS) ×2 IMPLANT
SLEEVE XCEL OPT CAN 5 100 (ENDOMECHANICALS) ×3 IMPLANT
SPONGE GAUZE 2X2 STER 10/PKG (GAUZE/BANDAGES/DRESSINGS)
STAPLE ECHEON FLEX 60 POW ENDO (STAPLE) ×2 IMPLANT
STAPLER RELOAD BLUE 60MM (STAPLE) ×3
STAPLER RELOAD GREEN 60MM (STAPLE)
SUT MNCRL AB 4-0 PS2 18 (SUTURE) ×3 IMPLANT
SUT PDS AB 0 CT1 36 (SUTURE) IMPLANT
SUT PDS AB 1 CT1 27 (SUTURE) ×2 IMPLANT
SUT SILK 2 0 SH (SUTURE) IMPLANT
TOWEL OR 17X26 10 PK STRL BLUE (TOWEL DISPOSABLE) ×3 IMPLANT
TRAY FOLEY MTR SLVR 14FR STAT (SET/KITS/TRAYS/PACK) ×3 IMPLANT
TRAY FOLEY MTR SLVR 16FR STAT (SET/KITS/TRAYS/PACK) IMPLANT
TRAY LAPAROSCOPIC (CUSTOM PROCEDURE TRAY) ×3 IMPLANT
TROCAR BLADELESS OPT 5 100 (ENDOMECHANICALS) ×3 IMPLANT
TROCAR XCEL 12X100 BLDLESS (ENDOMECHANICALS) ×3 IMPLANT

## 2021-04-10 NOTE — Anesthesia Preprocedure Evaluation (Addendum)
Anesthesia Evaluation  Patient identified by MRN, date of birth, ID band Patient awake    Reviewed: Allergy & Precautions, NPO status , Patient's Chart, lab work & pertinent test results, reviewed documented beta blocker date and time   Airway Mallampati: I  TM Distance: >3 FB Neck ROM: Full    Dental  (+) Missing, Dental Advisory Given,    Pulmonary neg pulmonary ROS,    Pulmonary exam normal breath sounds clear to auscultation       Cardiovascular hypertension, Pt. on home beta blockers and Pt. on medications Normal cardiovascular exam Rhythm:Regular Rate:Normal     Neuro/Psych  Headaches, PSYCHIATRIC DISORDERS Anxiety Depression Bipolar Disorder    GI/Hepatic Neg liver ROS, GERD  Controlled and Medicated,  Endo/Other  diabetes, Type 2, Insulin DependentHypothyroidism Morbid obesity (BMI 43)  Renal/GU negative Renal ROS  negative genitourinary   Musculoskeletal negative musculoskeletal ROS (+)   Abdominal   Peds  Hematology negative hematology ROS (+)   Anesthesia Other Findings   Reproductive/Obstetrics                            Anesthesia Physical Anesthesia Plan  ASA: 3  Anesthesia Plan: General   Post-op Pain Management:    Induction: Intravenous and Rapid sequence  PONV Risk Score and Plan: 3 and Midazolam, Dexamethasone and Ondansetron  Airway Management Planned: Oral ETT  Additional Equipment:   Intra-op Plan:   Post-operative Plan: Extubation in OR  Informed Consent: I have reviewed the patients History and Physical, chart, labs and discussed the procedure including the risks, benefits and alternatives for the proposed anesthesia with the patient or authorized representative who has indicated his/her understanding and acceptance.     Dental advisory given  Plan Discussed with: CRNA  Anesthesia Plan Comments:        Anesthesia Quick Evaluation

## 2021-04-10 NOTE — Anesthesia Procedure Notes (Signed)
Procedure Name: Intubation Date/Time: 04/10/2021 7:46 AM Performed by: Sharlette Dense, CRNA Pre-anesthesia Checklist: Patient identified, Emergency Drugs available, Suction available and Patient being monitored Patient Re-evaluated:Patient Re-evaluated prior to induction Oxygen Delivery Method: Circle system utilized Preoxygenation: Pre-oxygenation with 100% oxygen Induction Type: IV induction and Rapid sequence Laryngoscope Size: Miller and 2 Grade View: Grade I Tube type: Oral Tube size: 7.5 mm Number of attempts: 1 Airway Equipment and Method: Stylet Placement Confirmation: ETT inserted through vocal cords under direct vision, positive ETCO2 and breath sounds checked- equal and bilateral Secured at: 23 cm Tube secured with: Tape Dental Injury: Teeth and Oropharynx as per pre-operative assessment

## 2021-04-10 NOTE — Transfer of Care (Signed)
Immediate Anesthesia Transfer of Care Note  Patient: Lynn Martinez  Procedure(s) Performed: APPENDECTOMY LAPAROSCOPIC  Patient Location: PACU  Anesthesia Type:General  Level of Consciousness: drowsy  Airway & Oxygen Therapy: Patient Spontanous Breathing and Patient connected to face mask oxygen  Post-op Assessment: Report given to RN and Post -op Vital signs reviewed and stable  Post vital signs: Reviewed and stable  Last Vitals:  Vitals Value Taken Time  BP 139/87 04/10/21 0918  Temp 36.3 C 04/10/21 0918  Pulse 64 04/10/21 0921  Resp 18 04/10/21 0921  SpO2 100 % 04/10/21 0921  Vitals shown include unvalidated device data.  Last Pain:  Vitals:   04/10/21 0918  TempSrc:   PainSc: Asleep      Patients Stated Pain Goal: 2 (72/09/19 8022)  Complications: No notable events documented.

## 2021-04-10 NOTE — Anesthesia Postprocedure Evaluation (Signed)
Anesthesia Post Note  Patient: Abel Presto  Procedure(s) Performed: APPENDECTOMY LAPAROSCOPIC     Patient location during evaluation: PACU Anesthesia Type: General Level of consciousness: awake and alert Pain management: pain level controlled Vital Signs Assessment: post-procedure vital signs reviewed and stable Respiratory status: spontaneous breathing, nonlabored ventilation, respiratory function stable and patient connected to nasal cannula oxygen Cardiovascular status: blood pressure returned to baseline and stable Postop Assessment: no apparent nausea or vomiting Anesthetic complications: no   No notable events documented.  Last Vitals:  Vitals:   04/10/21 1050 04/10/21 1102  BP:    Pulse: 66 61  Resp: 20 14  Temp:    SpO2: 97% 100%    Last Pain:  Vitals:   04/10/21 1050  TempSrc:   PainSc: 3                  Chava Dulac L Adriella Essex

## 2021-04-10 NOTE — Op Note (Signed)
PATIENT:  Lynn Martinez  50 y.o. female  Patient Care Team: Elsie Stain, MD as PCP - General (Pulmonary Disease) Elbert Ewings, FNP (Nurse Practitioner)  PRE-OPERATIVE DIAGNOSIS:  APPENDICITIS  POST-OPERATIVE DIAGNOSIS:  ACUTE PHLEGMONOUS APPENDICITIS  PROCEDURE:   APPENDECTOMY LAPAROSCOPIC LAPAROSCOPIC LYSIS OF ADHESIONS TRANSVERSUS ABDOMINIS PLANE (TAP) BLOCK - BILATERAL  SURGEON:  Adin Hector, MD  ANESTHESIA:     local and general  Regional TRANSVERSUS ABDOMINIS PLANE (TAP) nerve block for perioperative & postoperative pain control provided with liposomal bupivacaine (Experel) mixed with 0.25% bupivacaine as a Bilateral TAP block x 42mL each side at the level of the transverse abdominis & preperitoneal spaces along the flank at the anterior axillary line, from subcostal ridge to iliac crest under laparoscopic guidance     EBL:  Total I/O In: -  Out: 150 [Urine:100; Blood:50]  Delay start of Pharmacological VTE agent (>24hrs) due to surgical blood loss or risk of bleeding:  no  DRAINS: none   SPECIMEN:   APPENDIX  DISPOSITION OF SPECIMEN:  PATHOLOGY  COUNTS:  YES  PLAN OF CARE: Admit to inpatient   PATIENT DISPOSITION:  PACU - hemodynamically stable.   INDICATIONS: Patient with concerning symptoms & work up suspicious for appendicitis.  Surgery was recommended:  The anatomy & physiology of the digestive tract was discussed.  The pathophysiology of appendicitis was discussed.  Natural history risks without surgery was discussed.   I feel the risks of no intervention will lead to serious problems that outweigh the operative risks; therefore, I recommended diagnostic laparoscopy with removal of appendix to remove the pathology.  Laparoscopic & open techniques were discussed.   I noted a good likelihood this will help address the problem.    Risks such as bleeding, infection, abscess, leak, reoperation, possible ostomy, hernia, heart attack,  death, and other risks were discussed.  Goals of post-operative recovery were discussed as well.  We will work to minimize complications.  Questions were answered.  The patient expresses understanding & wishes to proceed with surgery.  OR FINDINGS: Thickened inflamed appendix with phlegmon.  No evidence of feculent peritonitis or major perforation.  Anterior midline adhesions.  Involving sigmoid colon primarily and some small bowel.  Freed off.  No obstruction.  No other malady.  No diverticulitis.  CASE DATA:  Type of patient?: LDOW CASE (Surgical Hospitalist [facility] Inpatient)  Status of Case? URGENT Add On  Infection Present At Time Of Surgery (PATOS)?  PHLEGMON  DESCRIPTION:   Informed consent was confirmed.  The patient underwent general anaesthesia without difficulty.  The patient was positioned appropriately.  VTE prevention in place.  The patient's abdomen was clipped, prepped, & draped in a sterile fashion.  Surgical timeout confirmed our plan.  Peritoneal entry with a laparoscopic port was obtained using optical entry technique in the left upper abdomen as the patient was positioned in reverse Trendelenburg.  Entry was clean.  I induced carbon dioxide insufflation.  Camera inspection revealed no injury.  Extra ports were carefully placed under direct laparoscopic visualization.  Upon inspecting the abdomen, I encountered a phlegmon involving the appendix .  There were some infraumbilical anterior midline adhesions with sigmoid colon.  Colon was not inflamed and there is no twisting or torsion.  I left those in situ I did freeze some adhesions of terminal ileum to the sigmoid colon to get better at the appendix.  I mobilized the terminal ileum to proximal ascending colon in a lateral to medial fashion.  I  took care to avoid injuring any retroperitoneal structures.  I freed the appendix off its attachments to the ascending colon and cecal mesentery.  I elevated the appendix. I skeletonized  the mesoappendix. I was able to free off the base of the appendix which was still viable.  I stapled the appendix off the cecum using a laparoscopic stapler. I took a healthy cuff of viable cecum. I ligated the mesoappendix and assured hemostasis in the mesentery.  I placed the appendix inside an EcoSac bag and removed out the 42mm stapler port.  I ran the small bowel from the ileocecal valve more proximally towards the ligament of Treitz and confirmed that there was no bowel obstruction adhesion.  No Meckel's diverticulum.  No closed-loop obstruction or other concern.  Colon appeared normal from cecum all the way down to the peritoneal reflection of the mid rectum.  No peritonitis elsewhere.  I did copious irrigation. Hemostasis was good in the mesoappendix, colon mesentery, and retroperitoneum. Staple line was intact on the cecum with no bleeding. I washed out the pelvis, retrohepatic space and right paracolic gutter. I washed out the left side as well.  Hemostasis is good. There was no perforation or injury. Because the area cleaned up well after irrigation, I did not place a drain.  I closed the 12 mm stapler port site with a #1 PDS stitch using a suture passer under direct laparoscopic visualization.   I aspirated the carbon dioxide. I removed the ports.  I closed skin using 4-0 monocryl stitch.  Sterile dressings applied.  Patient was extubated and sent to the recovery room.  I discussed the operative findings with the patient's husband & family.  I suspect the patient has a good chance of recovering and being discharged later in the day.  I do not think she needs any extra antibiotics at this time.  Prescription for tramadol sent to her pharmacy.  Questions answered. They expressed understanding and appreciation.  Adin Hector, M.D., F.A.C.S. Gastrointestinal and Minimally Invasive Surgery [facility] [address] [place], [place] [address] [phone] Main  / Paging  04/10/2021 9:08 AM

## 2021-04-10 NOTE — Progress Notes (Signed)
Report given to PACU nurse, transport took patient to PACU for surgery. Patient stable

## 2021-04-10 NOTE — Progress Notes (Signed)
Inpatient Diabetes Program Recommendations  AACE/ADA: New Consensus Statement on Inpatient Glycemic Control   Target Ranges:  Prepandial:   less than 140 mg/dL      Peak postprandial:   less than 180 mg/dL (1-2 hours)      Critically ill patients:  140 - 180 mg/dL  Results for Lynn Martinez, Lynn Martinez (MRN [id number]) as of 04/10/2021 12:04  Ref. Range 04/10/2021 01:11 04/10/2021 04:27 04/10/21 7:46 04/10/2021 09:21 04/10/2021 10:01 04/10/2021 10:27 04/10/2021 10:59 04/10/2021 11:42  Glucose-Capillary Latest Ref Range: 70 - 99 mg/dL 222 (H)  Novolog 5 units@00 :35 92     Decadron 10 mg 253 (H)  Novolog 6 units@9 :28 263 (H) 256 (H)  Novolog 6 units @10 :32 276 (H) 244 (H)  Novolog 5 units @12 :09  Results for Lynn Martinez, Lynn Martinez (MRN [id number]) as of 04/10/2021 12:04  Ref. Range 04/09/2021 21:15  Glucose-Capillary Latest Ref Range: 70 - 99 mg/dL 366 (H)  Novolog 15 units  Semgle 15 units   Results for Lynn Martinez, Lynn Martinez (MRN [id number]) as of 04/10/2021 12:04  Ref. Range 04/10/2021 04:20  Hemoglobin A1C Latest Ref Range: 4.8 - 5.6 % 13.0 (H)    Review of Glycemic Control  Diabetes history: DM2 Outpatient Diabetes medications: Actos 30 mg daily Current orders for Inpatient glycemic control: Semglee 15 units QHS, Novolog 0-15 units Q4H  Inpatient Diabetes Program Recommendations:    Outpatient DM medication regimen: Would recommend discharging on Lantus 27 units QHS (based on 136.1 kg x 0.2 units) and Humalog 0-15 units TID with meals. Patient will need to follow up with Dr. Kelton Pillar to get started on new insulin pump and Dexcom CGM.  NOTE: Received page from Dr. Loleta Books regarding DM control and discharge plan for DM. Called and spoke with patient over the phone regarding DM. Patient confirms that she sees Dr. Kelton Pillar for DM management and she has recently only been taking Actos for DM control. Patient states that she recently got a new insulin  pump and Dexcom CGM but she has not gotten started on it yet. She states that she needs to make an appointment with pump trainer at Endocrinology office so she can get started on the new CGM and insulin pump. Patient states that she has Lantus and Humalog insulin at home but she has not taken any insulin in months. Patient states her glucose is usually 200-300's mg/dl and she reports that last A1C was 13.8%. Dicussed current A1C of 13% indicating an average glucose of 326 mg/dl.  Discussed glucose and A1C goals. Discussed importance of checking CBGs and maintaining good CBG control to prevent long-term and short-term complications. Stressed to the patient the importance of improving glycemic control to prevent further complications from uncontrolled diabetes especially following surgery.  Explained that she received Decadron 10 mg today which is impacting hyperglycemia.  Informed patient that it would be recommended that she use Lantus and Humalog insulin injections for now and follow up with Dr. Kelton Pillar to get started on new insulin pump and CGM. Patient states she would be agreeable to take SQ insulin injections as an outpatient and she will call on Monday morning to get an appointment with Dr. Kelton Pillar or the pump trainer in the office.    Patient verbalized understanding of information discussed and reports no further questions at this time related to diabetes.  Thanks, Barnie Alderman, RN, MSN, CDE Diabetes Coordinator Inpatient Diabetes Program [phone] (Team Pager)

## 2021-04-10 NOTE — Discharge Instructions (Addendum)
################################################################  LAPAROSCOPIC SURGERY: POST OP INSTRUCTIONS  ######################################################################  EAT Gradually transition to a high fiber diet with a fiber supplement over the next few weeks after discharge.  Start with a pureed / full liquid diet (see below)  WALK Walk an hour a day.  Control your pain to do that.    CONTROL PAIN Control pain so that you can walk, sleep, tolerate sneezing/coughing, go up/down stairs.  HAVE A BOWEL MOVEMENT DAILY Keep your bowels regular to avoid problems.  OK to try a laxative to override constipation.  OK to use an antidairrheal to slow down diarrhea.  Call if not better after 2 tries  CALL IF YOU HAVE PROBLEMS/CONCERNS Call if you are still struggling despite following these instructions. Call if you have concerns not answered by these instructions  ######################################################################    DIET: Follow a light bland diet & liquids the first 24 hours after arrival home, such as soup, liquids, starches, etc.  Be sure to drink plenty of fluids.  Quickly advance to a usual solid diet within a few days.  Avoid fast food or heavy meals as your are more likely to get nauseated or have irregular bowels.  A low-fat, high-fiber diet for the rest of your life is ideal.  Take your usually prescribed home medications unless otherwise directed.  PAIN CONTROL: Pain is best controlled by a usual combination of three different methods TOGETHER: Ice/Heat Over the counter pain medication Prescription pain medication Most patients will experience some swelling and bruising around the incisions.  Ice packs or heating pads (30-60 minutes up to 6 times a day) will help. Use ice for the first few days to help decrease swelling and bruising, then switch to heat to help relax tight/sore spots and speed recovery.  Some people prefer to use ice alone, heat  alone, alternating between ice & heat.  Experiment to what works for you.  Swelling and bruising can take several weeks to resolve.   It is helpful to take an over-the-counter pain medication regularly for the first few weeks.  Choose one of the following that works best for you: Naproxen (Aleve, etc)  Two 260m tabs twice a day Ibuprofen (Advil, etc) Three 2033mtabs four times a day (every meal & bedtime) Acetaminophen (Tylenol, etc) 500-65061mour times a day (every meal & bedtime) A  prescription for pain medication (such as oxycodone, hydrocodone, tramadol, gabapentin, methocarbamol, etc) should be given to you upon discharge.  Take your pain medication as prescribed.  If you are having problems/concerns with the prescription medicine (does not control pain, nausea, vomiting, rash, itching, etc), please call us [phone] see if we need to switch you to a different pain medicine that will work better for you and/or control your side effect better. If you need a refill on your pain medication, please give us [place] hour notice.  contact your pharmacy.  They will contact our office to request authorization. Prescriptions will not be filled after 5 pm or on week-ends  Avoid getting constipated.   Between the surgery and the pain medications, it is common to experience some constipation.   Increasing fluid intake and taking a fiber supplement (such as Metamucil, Citrucel, FiberCon, MiraLax, etc) 1-2 times a day regularly will usually help prevent this problem from occurring.   A mild laxative (prune juice, Milk of Magnesia, MiraLax, etc) should be taken according to package directions if there are no bowel movements after 48 hours.   Watch out for diarrhea.  If you have many loose bowel movements, simplify your diet to bland foods & liquids for a few days.   Stop any stool softeners and decrease your fiber supplement.   Switching to mild anti-diarrheal medications (Kayopectate, Pepto Bismol) can  help.   If this worsens or does not improve, please call us.  Wash / shower every day.  You may shower over the dressings as they are waterproof.  Continue to shower over incision(s) after the dressing is off.  It is good for closed incisions and even open wounds to be washed every day.  Shower every day.  Short baths are fine.  Wash the incisions and wounds clean with soap & water.    You may leave closed incisions open to air if it is dry.   You may cover the incision with clean gauze & replace it after your daily shower for comfort.  DERMABOND:  You have purple skin glue (Dermabond) on your incision(s).  Leave them in place, and they will fall off on their own like a scab in 2-3 weeks.  You may trim any edges that curl up with clean scissors.    ACTIVITIES as tolerated:   You may resume regular (light) daily activities beginning the next day--such as daily self-care, walking, climbing stairs--gradually increasing activities as tolerated.  If you can walk 30 minutes without difficulty, it is safe to try more intense activity such as jogging, treadmill, bicycling, low-impact aerobics, swimming, etc. Save the most intensive and strenuous activity for last such as sit-ups, heavy lifting, contact sports, etc  Refrain from any heavy lifting or straining until you are off narcotics for pain control.   DO NOT PUSH THROUGH PAIN.  Let pain be your guide: If it hurts to do something, don't do it.  Pain is your body warning you to avoid that activity for another week until the pain goes down. You may drive when you are no longer taking prescription pain medication, you can comfortably wear a seatbelt, and you can safely maneuver your car and apply brakes. You may have sexual intercourse when it is comfortable.  FOLLOW UP in our office Please call [facility] at [phone] to set up an appointment to see your surgeon in the office for a follow-up appointment approximately 2-3 weeks after your surgery. Make  sure that you call for this appointment the day you arrive home to insure a convenient appointment time.  10. IF YOU HAVE DISABILITY OR FAMILY LEAVE FORMS, BRING THEM TO THE OFFICE FOR PROCESSING.  DO NOT GIVE THEM TO YOUR DOCTOR.   WHEN TO CALL US [phone]: Poor pain control Reactions / problems with new medications (rash/itching, nausea, etc)  Fever over 101.5 F (38.5 C) Inability to urinate Nausea and/or vomiting Worsening swelling or bruising Continued bleeding from incision. Increased pain, redness, or drainage from the incision   The clinic staff is available to answer your questions during regular business hours (8:30am-5pm).  Please don't hesitate to call and ask to speak to one of our nurses for clinical concerns.   If you have a medical emergency, go to the nearest emergency room or call 911.  A surgeon from [facility] is always on call at the hospitals   [facility] [address], [address], [place], [place]  [address] ? MAIN: [phone] ? TOLL FREE: [phone] ?  FAX [phone] [email or web address]  ##############################################################  

## 2021-04-10 NOTE — Progress Notes (Signed)
  Progress Note    Lynn Martinez   SJG:[id number]  DOB: 27-Jan-1971  DOA: 04/09/2021     1 Date of Service: 04/10/2021     Brief summary: [name] is a 50 y.o. F with MO, PTSD, Bipolar depression, IDDM poorly controlled, and hypothyroidism who presented with 4 days RLQ abdominal pain, sweats and nausea.  In the ER, CT of the abdomen and pelvis showed uncomplicated appendicitis.  Pelvic ultrasound unremarkable. General surgery were consulted recommended appendectomy.  Her glucose was 351.   10/29: Successful appendectomy this morning, Glucoses still 200-300 this afternoon, and patient dizzy, not yet been able to tolerate solid diet       Assessment and Plan * Acute appendicitis S/p appendectomy 04/10/2021 by Dr. Johney Maine - Stop antibiotics - Continue clears, advance to solid diet if able tonight or tomorrow    Type 2 diabetes mellitus with hyperglycemia, with long-term current use of insulin (HCC) Glucoses still 200s, 300s today.   - Lantus, increase dose -Hold actos -Continue statin -Continue SS corrections   Bipolar 1 disorder, depressed, moderate (HCC) - Continue Latuda, lithium  Hypertension BP controlled with propranolol this afternoon - Continue propanolol  Hypothyroidism - Continue levothyroxine  PTSD (post-traumatic stress disorder) - Continue prazosin  Morbid obesity with BMI of 40.0-44.9, adult (HCC) BMI 43     Subjective:  Patient is dizzy, wobbly with standing.  She is still sleepy from anesthesia, has nausea has not been able to eat anything solid this afternoon.  Glucoses have still been on the high 200s, low 300s today.  Objective Vitals:   04/10/21 1123 04/10/21 1212 04/10/21 1317 04/10/21 1415  BP: (!) 160/121 (!) 154/96 (!) 170/82 (!) 149/82  Pulse: 62 (!) 56 (!) 56 62  Resp: 16 18 18 18   Temp:      TempSrc:      SpO2: 100% 97% 98% 99%  Weight:      Height:       136.1 kg  Vital signs were reviewed and  unremarkable except for: Some hypertension   Exam General appearance: Obese adult female, sitting up in bed, appears sleepy     HEENT: Anicteric, conjunctival pink, lids and lashes normal.  No nasal deformity, discharge, or epistaxis. Skin: No suspicious rashes or lesions. Cardiac: RRR, no murmurs, no lower extremity edema Respiratory: Normal respiratory rate and rhythm, lungs clear without rales wheezes, breath sounds distant due to habitus Abdomen: Abdomen without focal tenderness MSK:  Neuro: Awake but sleepy, face symmetric, extraocular movements intact, moves upper extremities with normal strength and coordination Psych: Attention diminished, affect blunted, judgment insight appear overall normal however    Labs / Other Information My review of labs, imaging, notes and other tests is significant for A1c 13%, stable renal function     Disposition Plan: Status is: Inpatient  Remains inpatient appropriate because: Has not been able to tolerate p.o. intake, still symptomatic from hyperglycemia        Time spent: 25 minutes [facility] 04/10/2021, 4:35 PM

## 2021-04-11 ENCOUNTER — Encounter (HOSPITAL_COMMUNITY): Payer: Self-pay | Admitting: Surgery

## 2021-04-11 LAB — GLUCOSE, CAPILLARY: Glucose-Capillary: 177 mg/dL — ABNORMAL HIGH (ref 70–99)

## 2021-04-11 MED ORDER — INSULIN GLARGINE 100 UNIT/ML SOLOSTAR PEN: 28.0000 [IU] | PEN_INJECTOR | Freq: Every day | SUBCUTANEOUS | 11 refills | Status: DC

## 2021-04-11 MED ORDER — INSULIN ASPART 100 UNIT/ML FLEXPEN: 7.0000 [IU] | PEN_INJECTOR | Freq: Three times a day (TID) | SUBCUTANEOUS | 11 refills | Status: DC

## 2021-04-11 NOTE — Assessment & Plan Note (Addendum)
Glucoses difficult to control.  Patient has pump but had difficulty providing specific details for when she got it, when she was supposed to use it and how.  Diabetes education was consulted.  They conversed with patient and agreed, she would benefit from directed insulin pump education.  Discharged on: Lantus 28 nightly Aspart 7 TID plus sliding scale corrections Recommended close Endo follow up to initiate pump

## 2021-04-11 NOTE — Assessment & Plan Note (Addendum)
S/p appendectomy 04/11/2021 by Dr. Johney Maine

## 2021-04-11 NOTE — Assessment & Plan Note (Addendum)
BMI 43 

## 2021-04-11 NOTE — TOC Transition Note (Signed)
Transition of Care [facility]) - CM/SW Discharge Note   Patient Details  Name: Lynn Martinez MRN: [id number] Date of Birth: [birth year]  Transition of Care [facility]) CM/SW Contact:  Dessa Phi, RN Phone Number: 04/11/2021, 10:58 AM   Clinical Narrative:  Spoke to patient about MATCH Program/letter-informed of program-1x use witihn 12 calendar months/$3 co pay for each med that qualifies(has co pay)/no narcotics/Select pharmacies/must take letter t select pharmacy-Patient agreed to program. [facility] CSW to deliver letter to patient.     Final next level of care: Home/Self Care Barriers to Discharge: No Barriers Identified   Patient Goals and CMS Choice Patient states their goals for this hospitalization and ongoing recovery are:: go home CMS Medicare.gov Compare Post Acute Care list provided to:: Patient    Discharge Placement                       Discharge Plan and Services                                     Social Determinants of Health (SDOH) Interventions     Readmission Risk Interventions No flowsheet data found.

## 2021-04-11 NOTE — TOC Progression Note (Signed)
Transition of Care [facility]) - Progression Note    Patient Details  Name: Lynn Martinez MRN: [id number] Date of Birth: [birth year]  Transition [facility]) CM/SW Contact  Ross Ludwig, [place] Phone Number: 04/11/2021, 11:28 AM  Clinical Narrative:     Match letter given to patient's bedside nurse to give to patient with her discharge paperwork.  [place] letter has locations of pharmacies, attached to second page.    Barriers to Discharge: No Barriers Identified  Expected Discharge Plan and Services           Expected Discharge Date: 04/11/21                                     Social Determinants of Health (SDOH) Interventions    Readmission Risk Interventions No flowsheet data found.
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
## 2021-04-12 ENCOUNTER — Telehealth: Payer: Self-pay

## 2021-04-12 ENCOUNTER — Other Ambulatory Visit: Payer: Self-pay

## 2021-04-12 LAB — GLUCOSE, CAPILLARY: Glucose-Capillary: 311 mg/dL — ABNORMAL HIGH (ref 70–99)

## 2021-04-12 NOTE — Telephone Encounter (Signed)
Transition Care Management Follow-up Telephone Call Date of discharge and from where: 04/11/2021, [facility]  How have you been since you were released from the hospital? She said she is still experiencing pain but doing okay.  Tramadol is helping with pain control Any questions or concerns? 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 Her husband drives her to work and she sits down on her job. Instructed her to discuss return to work with her Psychologist, sport and exercise.   Items Reviewed: Did the pt receive and understand the discharge instructions provided? Yes  Medications obtained and verified? Yes - She said she has all medications and is aware of the changes in her med regime, including the sliding scale for novolog and increase in lantus.She has an insulin pump but needs to meet with her endocrinologist to learn how to use it correctly. She also has a dexcom and will need instruction on how to use that too.  In the meantime, she is checking her blood sugar by fingerstick. Blood sugar this morning - 209.  Other? No  Any new allergies since your discharge? No  Dietary orders reviewed? No - call was disconnected before this was reviewed.  Do you have support at home? Yes , her husband  [place] and Equipment/Supplies: Were home health services ordered? no If so, what is the name of the agency? N/a  Has the agency set up a time to come to the patient's home? not applicable Were any new equipment or medical supplies ordered?  No What is the name of the medical supply agency? N/a Were you able to get the supplies/equipment? not applicable Do you have any questions related to the use of the equipment or supplies? No  She said that her surgical site is healing well, no concerns at this time.   Functional Questionnaire: (I = Independent and D = Dependent) ADLs: independent   Follow up appointments reviewed:  PCP Hospital f/u appt  confirmed? Yes  Scheduled to see Dr Joya Gaskins on 05/03/2021 Specialist Hospital f/u appt confirmed?  She said she needs to call the surgeon tomorrow and scheduled her follow  up. She also needs to schedule an appointment with her endocrinologist.    Are transportation arrangements needed? No  If their condition worsens, is the pt aware to call PCP or go to the Emergency Dept.? Yes Was the patient provided with contact information for the PCP's office or ED? Yes Was to pt encouraged to call back with questions or concerns? The phone call was disconnected before this was discussed.  This CM called her back and left voicemail message with with CM call back number. The phone number for [facility] is on her AVS. Instructions for when to call MD are also noted on her AVS

## 2021-04-13 ENCOUNTER — Other Ambulatory Visit: Payer: Self-pay

## 2021-04-13 LAB — SURGICAL PATHOLOGY

## 2021-04-22 ENCOUNTER — Other Ambulatory Visit: Payer: Self-pay

## 2021-04-26 ENCOUNTER — Telehealth: Payer: Self-pay | Admitting: Critical Care Medicine

## 2021-04-26 NOTE — Telephone Encounter (Signed)
Dr. Joya Gaskins on vacation 11/21. Left vm for patient to call [phone] to reschedule appt.

## 2021-05-03 ENCOUNTER — Ambulatory Visit: Payer: Medicaid Other | Admitting: Critical Care Medicine

## 2021-05-12 ENCOUNTER — Emergency Department (HOSPITAL_COMMUNITY)
Admission: EM | Admit: 2021-05-12 | Discharge: 2021-05-12 | Disposition: A | Discharge status: Home / Self Care | Payer: Self-pay | Attending: Emergency Medicine | Admitting: Emergency Medicine

## 2021-05-12 ENCOUNTER — Encounter (HOSPITAL_COMMUNITY): Payer: Self-pay

## 2021-05-12 ENCOUNTER — Other Ambulatory Visit: Payer: Self-pay

## 2021-05-12 ENCOUNTER — Emergency Department (HOSPITAL_COMMUNITY): Payer: Self-pay

## 2021-05-12 DIAGNOSIS — E119 Type 2 diabetes mellitus without complications: Secondary | ICD-10-CM | POA: Insufficient documentation

## 2021-05-12 DIAGNOSIS — N9489 Other specified conditions associated with female genital organs and menstrual cycle: Secondary | ICD-10-CM | POA: Insufficient documentation

## 2021-05-12 DIAGNOSIS — Z79899 Other long term (current) drug therapy: Secondary | ICD-10-CM | POA: Insufficient documentation

## 2021-05-12 DIAGNOSIS — R109 Unspecified abdominal pain: Secondary | ICD-10-CM

## 2021-05-12 DIAGNOSIS — R197 Diarrhea, unspecified: Secondary | ICD-10-CM | POA: Insufficient documentation

## 2021-05-12 DIAGNOSIS — Z794 Long term (current) use of insulin: Secondary | ICD-10-CM | POA: Insufficient documentation

## 2021-05-12 DIAGNOSIS — I10 Essential (primary) hypertension: Secondary | ICD-10-CM | POA: Insufficient documentation

## 2021-05-12 DIAGNOSIS — R1084 Generalized abdominal pain: Secondary | ICD-10-CM | POA: Insufficient documentation

## 2021-05-12 DIAGNOSIS — Z85850 Personal history of malignant neoplasm of thyroid: Secondary | ICD-10-CM | POA: Insufficient documentation

## 2021-05-12 DIAGNOSIS — E039 Hypothyroidism, unspecified: Secondary | ICD-10-CM | POA: Insufficient documentation

## 2021-05-12 LAB — CBC
HCT: 40.1 % (ref 36.0–46.0)
Hemoglobin: 12.7 g/dL (ref 12.0–15.0)
MCH: 26.9 pg (ref 26.0–34.0)
MCHC: 31.7 g/dL (ref 30.0–36.0)
MCV: 85 fL (ref 80.0–100.0)
Platelets: 477 10*3/uL — ABNORMAL HIGH (ref 150–400)
RBC: 4.72 MIL/uL (ref 3.87–5.11)
RDW: 13.9 % (ref 11.5–15.5)
WBC: 9.6 10*3/uL (ref 4.0–10.5)
nRBC: 0 % (ref 0.0–0.2)

## 2021-05-12 LAB — URINALYSIS, ROUTINE W REFLEX MICROSCOPIC
Bilirubin Urine: NEGATIVE
Glucose, UA: >=500 mg/dL — AB
Hgb urine dipstick: NEGATIVE
Ketones, ur: NEGATIVE mg/dL
Leukocytes,Ua: NEGATIVE
Nitrite: NEGATIVE
Protein, ur: NEGATIVE mg/dL
Specific Gravity, Urine: 1.027 (ref 1.005–1.030)
pH: 5 (ref 5.0–8.0)

## 2021-05-12 LAB — COMPREHENSIVE METABOLIC PANEL
ALT: 19 U/L (ref 0–44)
AST: 12 U/L — ABNORMAL LOW (ref 15–41)
Albumin: 4 g/dL (ref 3.5–5.0)
Alkaline Phosphatase: 73 U/L (ref 38–126)
Anion gap: 6 (ref 5–15)
BUN: 12 mg/dL (ref 6–20)
CO2: 27 mmol/L (ref 22–32)
Calcium: 9 mg/dL (ref 8.9–10.3)
Chloride: 98 mmol/L (ref 98–111)
Creatinine, Ser: 0.86 mg/dL (ref 0.44–1.00)
GFR, Estimated: >60 mL/min (ref >60)
Glucose, Bld: 333 mg/dL — ABNORMAL HIGH (ref 70–99)
Potassium: 3.9 mmol/L (ref 3.5–5.1)
Sodium: 131 mmol/L — ABNORMAL LOW (ref 135–145)
Total Bilirubin: 0.3 mg/dL (ref 0.3–1.2)
Total Protein: 8.1 g/dL (ref 6.5–8.1)

## 2021-05-12 LAB — LIPASE, BLOOD: Lipase: 22 U/L (ref 11–51)

## 2021-05-12 LAB — I-STAT BETA HCG BLOOD, ED (MC, WL, AP ONLY): I-stat hCG, quantitative: <5 m[IU]/mL (ref <5)

## 2021-05-12 MED ORDER — ONDANSETRON HCL 4 MG/2ML IJ SOLN
4.0000 mg | Freq: Once | INTRAMUSCULAR | Status: AC
  Administered 2021-05-12: 4 mg via INTRAVENOUS
  Filled 2021-05-12: qty 2

## 2021-05-12 MED ORDER — INSULIN ASPART 100 UNIT/ML IJ SOLN
6.0000 [IU] | Freq: Once | INTRAMUSCULAR | Status: AC
  Administered 2021-05-12: 6 [IU] via SUBCUTANEOUS
  Filled 2021-05-12: qty 0.06

## 2021-05-12 MED ORDER — DICYCLOMINE HCL 20 MG PO TABS: 20.0000 mg | ORAL_TABLET | Freq: Two times a day (BID) | ORAL | 0 refills | Status: DC

## 2021-05-12 MED ORDER — LOPERAMIDE HCL 2 MG PO CAPS: 2.0000 mg | ORAL_CAPSULE | Freq: Four times a day (QID) | ORAL | 0 refills | Status: DC | PRN

## 2021-05-12 MED ORDER — FENTANYL CITRATE PF 50 MCG/ML IJ SOSY
50.0000 ug | PREFILLED_SYRINGE | Freq: Once | INTRAMUSCULAR | Status: AC
Start: 2021-05-12 — End: 2021-05-12
  Administered 2021-05-12: 50 ug via INTRAVENOUS
  Filled 2021-05-12: qty 1

## 2021-05-12 MED ORDER — IOHEXOL 350 MG/ML SOLN
100.0000 mL | Freq: Once | INTRAVENOUS | Status: AC | PRN
  Administered 2021-05-12: 100 mL via INTRAVENOUS

## 2021-05-12 NOTE — ED Provider Notes (Signed)
Emergency Medicine Provider Triage Evaluation Note  Lynn Martinez , a 50 y.o. female  was evaluated in triage.  Pt had an appendectomy 4 weeks ago.  Over the last 4 days she has developed increasing abdominal pain in a bandlike distribution over her lower quadrants as well as nausea.  Describes it as burning and sharp.  Review of Systems  Positive: Abdominal pain, NV, diarrhea as she transitions from liquid to solids Negative:   Physical Exam  BP (!) 141/91   Pulse 75   Temp 98.2 F (36.8 C) (Oral)   Resp 18   Ht 5\' 10"  (1.778 m)   Wt 136.1 kg   LMP 09/20/2011   SpO2 92%   BMI 43.05 kg/m  Gen:   Awake, no distress   Resp:  Normal effort  MSK:   Moves extremities without difficulty  Other:  Tenderness to palpation in lower quadrants.  Medical Decision Making  Medically screening exam initiated at 12:51 PM.  Appropriate orders placed.  Lynn Martinez was informed that the remainder of the evaluation will be completed by another provider, this initial triage assessment does not replace that evaluation, and the importance of remaining in the ED until their evaluation is complete.     Rhae Hammock, PA-C 05/12/21 [place], MD 05/12/21 [phone]

## 2021-05-12 NOTE — ED Notes (Signed)
Patient has a urine culture in the main lab 

## 2021-05-12 NOTE — Discharge Instructions (Signed)
The medications help with the diarrhea and the cramping.  You can supplement with over-the-counter acetaminophen.  Follow-up with a surgeon to be rechecked.

## 2021-05-12 NOTE — ED Provider Notes (Signed)
[facility] DEPT Provider Note   CSN: [id number] Arrival date & time: 05/12/21  8338     History Chief Complaint  Patient presents with   Abdominal Pain   Diarrhea    Lynn Martinez [name] is a 50 y.o. female.   Abdominal Pain Associated symptoms: diarrhea   Diarrhea Associated symptoms: abdominal pain    Patient presents to the ED for evaluation of abdominal pain.  Patient was admitted to the hospital October 28 for acute appendicitis.  Patient had surgery on the 29th.  Patient states that she felt like she was doing okay but symptoms have been worsening where she is getting diarrhea and abdominal pain that was initially on the left side but now all over.  She does have some pain whenever she tries to eat and she has more diarrhea.  She denies any vomiting.  She feels like her abdomen is somewhat swollen and distended.  She has not seen her surgeon since the surgery.  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   Migraine     otc med prn   PCOS (polycystic ovarian syndrome)    PMS (premenstrual syndrome)    PTSD (post-traumatic stress disorder)    Seasonal allergies    Thyroid cancer ([place]) 2005   PAST HX   Thyroid disease    Type II diabetes mellitus ([place])    Vertigo     Patient Active Problem List   Diagnosis Date Noted   Acute appendicitis 04/09/2021   Bipolar 1 disorder, depressed, full remission ([place]) 10/19/2020   Overactive  bladder 08/20/2020   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   Heme positive stool 07/15/2020   Morbid obesity with BMI of 40.0-44.9, adult ([place]) 07/09/2020   Bipolar 1 disorder, depressed, moderate ([place]) 02/10/2020   Breast pain, left 06/11/2019   Radiculopathy, cervical region 11/21/2018   Carpal tunnel syndrome, right upper limb 10/08/2018   Carpal tunnel syndrome, left upper limb 10/08/2018   Hypertension 09/30/2015   Type 2 diabetes mellitus with hyperglycemia, with long-term current use of insulin ([place]) 09/30/2015   S/P cholecystectomy 09/05/2013   Borderline behavior 03/14/2012    Class: Chronic   OCD (obsessive compulsive disorder) 03/14/2012    Class: Chronic   PTSD (post-traumatic stress disorder) 03/14/2012    Class: Chronic   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   ABDOMINAL HYSTERECTOMY     CHOLECYSTECTOMY N/A 09/06/2013   Procedure: LAPAROSCOPIC CHOLECYSTECTOMY WITH INTRAOPERATIVE CHOLANGIOGRAM;  Surgeon: Merrie Roof, MD;  Location: [facility];  Service: General;  Laterality: N/A;   COLONOSCOPY     DIAGNOSTIC LAPAROSCOPY     DILATION AND CURETTAGE OF UTERUS     HERNIA REPAIR     LAPAROSCOPIC APPENDECTOMY N/A 04/10/2021   Procedure: APPENDECTOMY LAPAROSCOPIC;  Surgeon: Michael Boston, MD;  Location: [facility];  Service: General;  Laterality: N/A;   MYOMECTOMY  06/13/2008   LAPAROSCOPY   thyroid removed     TOTAL THYROIDECTOMY  93/26/7124   UMBILICAL HERNIA  REPAIR  06/13/1980     OB History     Gravida  2   Para  0   Term  0   Preterm  0   AB  2   Living         SAB  2   IAB  0   Ectopic  0   Multiple      Live Births  0           Family History  Problem Relation Age of Onset   Thyroid disease Mother    Colon polyps Father    Diabetes Father    Hypertension Father    Heart disease Father    Heart attack Father    Cancer Maternal Aunt        BRAIN   Stomach cancer Maternal Grandmother    Colon cancer Maternal Grandmother    Cancer Maternal Grandmother        LYMPHOMA   Hypertension Maternal Grandmother    Colon cancer Maternal Grandfather    Hypertension Maternal Grandfather    Diabetes Maternal Grandfather    Cancer Paternal Grandmother        PANCREATIC   Diabetes Paternal Grandmother    Hypertension Paternal Grandmother    Cancer Paternal Grandfather    Hypertension Paternal Grandfather    Pancreatic cancer Paternal Grandfather    Esophageal cancer Neg Hx    Rectal cancer Neg Hx     Social History   Tobacco Use   Smoking status: Never   Smokeless tobacco: Never  Vaping Use   Vaping Use: Never used  Substance Use Topics   Alcohol use: Never   Drug use: Never    Home Medications Prior to Admission medications   Medication Sig Start Date End Date Taking? Authorizing Provider  dicyclomine (BENTYL) 20 MG tablet Take 1 tablet (20 mg total) by mouth 2 (two) times daily. 05/12/21  Yes Dorie Rank, MD  loperamide (IMODIUM) 2 MG capsule Take 1 capsule (2 mg total) by mouth 4 (four) times daily as needed for diarrhea or loose stools. 05/12/21  Yes Dorie Rank, MD  atorvastatin (LIPITOR) 10 MG tablet TAKE 1 TABLET (10 MG TOTAL) BY MOUTH DAILY. TO LOWER CHOLESTEROL Patient taking differently: Take 10 mg by mouth at bedtime. 01/19/21 01/19/22  Elsie Stain, MD  citalopram (CELEXA) 40 MG tablet Take 1 tablet (40 mg total) by mouth daily. 03/17/21   Salley Slaughter, NP  clindamycin-benzoyl peroxide  (BENZACLIN) gel Apply topically 2 (two) times daily. Patient taking differently: Apply 1 application topically See admin instructions. Apply to affected ares(s) 2 times a day as directed 01/19/21 01/19/22  Elsie Stain, MD  Continuous Blood Gluc Transmit (DEXCOM G6 TRANSMITTER) MISC 1 Device by Does not apply route as directed. Patient not taking:  Reported on 04/09/2021 08/17/20   Shamleffer, Melanie Crazier, MD  gabapentin (NEURONTIN) 300 MG capsule TAKE 2 CAPSULES (600 MG TOTAL) BY MOUTH DAILY. Patient taking differently: Take 900 mg by mouth 3 (three) times daily as needed (for neuropathy). 01/19/21 01/19/22  Elsie Stain, MD  GEMTESA 75 MG TABS Take 75 mg by mouth at bedtime.    [provider]  hydrOXYzine (ATARAX/VISTARIL) 25 MG tablet TAKE 1 TABLET (25 MG TOTAL) BY MOUTH 2 (TWO) TIMES DAILY. Patient taking differently: Take 25-50 mg by mouth See admin instructions. Take 25 mg by mouth in the morning and 50 mg at bedtime 03/17/21   Eulis Canner E, NP  insulin aspart (NOVOLOG) 100 UNIT/ML FlexPen Inject 7 Units into the skin 3 (three) times daily with meals. Plus sliding scale as directed in separate instructions 04/11/21   Danford, Suann Larry, MD  Insulin Disposable Pump (OMNIPOD DASH 5 PACK PODS) MISC USE 1 DEVICE AS DIRECTED. Patient not taking: No sig reported 08/12/20 08/12/21  Shamleffer, Melanie Crazier, MD  insulin glargine (LANTUS) 100 UNIT/ML Solostar Pen Inject 28 Units into the skin at bedtime. 04/11/21   Danford, Suann Larry, MD  Insulin Pen Needle 32G X 4 MM MISC USE AS INSTRUCTED TO INJECT INSULIN. 07/09/20 07/09/21  Elsie Stain, MD  levothyroxine (SYNTHROID) 137 MCG tablet TAKE 1 CAPSULE (137 MCG TOTAL) BY MOUTH DAILY BEFORE BREAKFAST. Patient taking differently: Take 137 mcg by mouth daily before breakfast. 01/19/21 01/19/22  Elsie Stain, MD  lithium carbonate (ESKALITH) 450 MG CR tablet TAKE 2 CAPSULES AT BEDTIME Patient taking differently: Take 900 mg by  mouth at bedtime. 03/17/21   Eulis Canner E, NP  lurasidone (LATUDA) 80 MG TABS tablet TAKE 1 TABLET (80 MG TOTAL) BY MOUTH AT BEDTIME. Patient taking differently: Take 80 mg by mouth at bedtime. 03/17/21   Salley Slaughter, NP  methocarbamol (ROBAXIN) 500 MG tablet TAKE 1 TABLET (500 MG TOTAL) BY MOUTH 3 (THREE) TIMES DAILY AS NEEDED FOR MUSCLE SPASMS. Patient taking differently: Take 1,500 mg by mouth daily as needed for muscle spasms. 01/19/21 01/19/22  Elsie Stain, MD  Multiple Vitamins-Minerals (ONE-A-DAY WOMENS PO) Take 1 tablet by mouth daily with breakfast.    [provider]  omeprazole (PRILOSEC) 40 MG capsule Take 1 capsule (40 mg total) by mouth daily. Patient taking differently: Take 40 mg by mouth 2 (two) times daily before a meal. 01/19/21 01/19/22  Elsie Stain, MD  ondansetron (ZOFRAN) 4 MG tablet Take 1 tablet (4 mg total) by mouth every 8 (eight) hours as needed for nausea. 04/10/21   Michael Boston, MD  pioglitazone (ACTOS) 30 MG tablet Take 30 mg by mouth daily in the afternoon.    [provider]  prazosin (MINIPRESS) 1 MG capsule TAKE 1 CAPSULE (1 MG TOTAL) BY MOUTH AT BEDTIME. FOR SLEEP/NIGHTMARES Patient taking differently: Take 1 mg by mouth at bedtime. 03/17/21   Salley Slaughter, NP  propranolol (INDERAL) 40 MG tablet TAKE 1 TABLET (40 MG TOTAL) BY MOUTH 2 (TWO) TIMES DAILY. Patient taking differently: Take 40 mg by mouth in the morning and at bedtime. 01/19/21 01/19/22  Elsie Stain, MD  traMADol (ULTRAM) 50 MG tablet Take 1-2 tablets (50-100 mg total) by mouth every 6 (six) hours as needed for moderate pain or severe pain. 04/10/21   Michael Boston, MD  Vitamin D, Ergocalciferol, (DRISDOL) 1.25 MG (50000 UNIT) CAPS capsule TAKE 1 CAPSULE (50,000 UNITS TOTAL) BY MOUTH EVERY 7 (SEVEN)  DAYS. Patient taking differently: Take 50,000 Units by mouth every Saturday. 01/19/21 01/19/22  Elsie Stain, MD  Wheat Dextrin (BENEFIBER) POWD Take 1 packet by  mouth every other day.    [provider]    Allergies    Bee venom, Morphine, Tape, Codeine, Golytely [peg 3350-electrolytes], Ibuprofen, Metformin and related, Penicillins, and Aspirin  Review of Systems   Review of Systems  Gastrointestinal:  Positive for abdominal pain and diarrhea.  All other systems reviewed and are negative.  Physical Exam Updated Vital Signs BP 127/67   Pulse 62   Temp 98.2 F (36.8 C) (Oral)   Resp 18   Ht 1.778 m (5\' 10" )   Wt 136.1 kg   LMP 09/20/2011   SpO2 98%   BMI 43.05 kg/m   Physical Exam Vitals and nursing note reviewed.  Constitutional:      General: She is not in acute distress.    Appearance: She is well-developed.  HENT:     Head: Normocephalic and atraumatic.     Right Ear: External ear normal.     Left Ear: External ear normal.  Eyes:     General: No scleral icterus.       Right eye: No discharge.        Left eye: No discharge.     Conjunctiva/sclera: Conjunctivae normal.  Neck:     Trachea: No tracheal deviation.  Cardiovascular:     Rate and Rhythm: Normal rate and regular rhythm.  Pulmonary:     Effort: Pulmonary effort is normal. No respiratory distress.     Breath sounds: Normal breath sounds. No stridor. No wheezing or rales.  Abdominal:     General: Bowel sounds are normal.     Palpations: Abdomen is soft.     Tenderness: There is generalized abdominal tenderness. There is no guarding or rebound.     Comments: Protuberant abdomen  Musculoskeletal:        General: No tenderness or deformity.     Cervical back: Neck supple.  Skin:    General: Skin is warm and dry.     Findings: No rash.  Neurological:     General: No focal deficit present.     Mental Status: She is alert.     Cranial Nerves: No cranial nerve deficit (no facial droop, extraocular movements intact, no slurred speech).     Sensory: No sensory deficit.     Motor: No abnormal muscle tone or seizure activity.     Coordination: Coordination  normal.  Psychiatric:        Mood and Affect: Mood normal.    ED Results / Procedures / Treatments   Labs (all labs ordered are listed, but only abnormal results are displayed) Labs Reviewed  COMPREHENSIVE METABOLIC PANEL - Abnormal; Notable for the following components:      Result Value   Sodium 131 (*)    Glucose, Bld 333 (*)    AST 12 (*)    All other components within normal limits  CBC - Abnormal; Notable for the following components:   Platelets 477 (*)    All other components within normal limits  URINALYSIS, ROUTINE W REFLEX MICROSCOPIC - Abnormal; Notable for the following components:   Glucose, UA >=500 (*)    Bacteria, UA RARE (*)    All other components within normal limits  LIPASE, BLOOD  I-STAT BETA HCG BLOOD, ED ([facility], [facility], [facility] ONLY)    EKG None  Radiology CT ABDOMEN PELVIS W CONTRAST  Result Date: 05/12/2021 CLINICAL DATA:  Increasing abdominal pain along the lower abdomen with nausea over the last 4 days. Appendectomy 1 month ago. EXAM: CT ABDOMEN AND PELVIS WITH CONTRAST TECHNIQUE: Multidetector CT imaging of the abdomen and pelvis was performed using the standard protocol following bolus administration of intravenous contrast. CONTRAST:  187mL OMNIPAQUE IOHEXOL 350 MG/ML SOLN COMPARISON:  04/09/2021 FINDINGS: Lower chest: Unremarkable Hepatobiliary: Cholecystectomy.  Otherwise unremarkable. Pancreas: Unremarkable Spleen: Unremarkable Adrenals/Urinary Tract: Unremarkable Stomach/Bowel: Previous appendectomy.  Otherwise unremarkable. Vascular/Lymphatic: Unremarkable Reproductive: Uterus absent.  Adnexa unremarkable. Other: No supplemental non-categorized findings. Musculoskeletal: Unremarkable IMPRESSION: 1. No specific abnormality is identified to correspond with the patient's recent lower abdominal pain. No specific complicating feature related to the patient's recent appendectomy is identified. Electronically Signed   By: Van Clines M.D.   On: 05/12/2021  14:52    Procedures Procedures   Medications Ordered in ED Medications  insulin aspart (novoLOG) injection 6 Units (has no administration in time range)  fentaNYL (SUBLIMAZE) injection 50 mcg (has no administration in time range)  ondansetron (ZOFRAN) injection 4 mg (4 mg Intravenous Given 05/12/21 1330)  iohexol (OMNIPAQUE) 350 MG/ML injection 100 mL (100 mLs Intravenous Contrast Given 05/12/21 1423)    ED Course  I have reviewed the triage vital signs and the nursing notes.  Pertinent labs & imaging results that were available during my care of the patient were reviewed by me and considered in my medical decision making (see chart for details).  Clinical Course as of 05/12/21 1502  Wed May 12, 2021  1418 CBC unremarkable.  Lipase normal.  Metabolic panel notable for sodium 131 and glucose 333.  Urinalysis unremarkable [JK]  1418 CT scan has been ordered and is pending [JK]  8502 CT scan is reassuring.  No signs of persistent complication associated with her appendectomy. [JK]    Clinical Course User Index [JK] Dorie Rank, MD   MDM Rules/Calculators/A&P                           Patient presented to the ED for evaluation of abdominal pain.  Patient recently had surgery several weeks ago.  Patient evaluated for the possibility of complication associated with her appendicitis.  Patient's laboratory test unremarkable.  No signs of hepatitis or pancreatitis.  Urinalysis not suggestive of infection.  CT scan did not show signs of any complication associated with her surgery or other acute abnormality.  We will try discharge home with symptomatic treatment.  Discussed outpatient follow-up with her surgeon Final Clinical Impression(s) / ED Diagnoses Final diagnoses:  Abdominal pain, unspecified abdominal location  Diarrhea, unspecified type    Rx / DC Orders ED Discharge Orders          Ordered    dicyclomine (BENTYL) 20 MG tablet  2 times daily        05/12/21 1501     loperamide (IMODIUM) 2 MG capsule  4 times daily PRN        05/12/21 1501             Dorie Rank, MD 05/12/21 1504

## 2021-05-12 NOTE — ED Triage Notes (Signed)
Pt reports having her appendix removed about 4.5 weeks ago. She now endorses left sided abdominal pain, nausea, and diarrhea that has become progressively worse over the past few weeks. She reports pain and diarrhea upon eating.

## 2021-05-20 ENCOUNTER — Other Ambulatory Visit: Payer: Self-pay

## 2021-05-20 ENCOUNTER — Encounter: Payer: Self-pay | Admitting: Internal Medicine

## 2021-05-20 ENCOUNTER — Ambulatory Visit (INDEPENDENT_AMBULATORY_CARE_PROVIDER_SITE_OTHER): Payer: Medicaid Other | Admitting: Internal Medicine

## 2021-05-20 VITALS — BP 138/84 | HR 91 | Ht 70.0 in | Wt 294.0 lb

## 2021-05-20 DIAGNOSIS — E039 Hypothyroidism, unspecified: Secondary | ICD-10-CM

## 2021-05-20 DIAGNOSIS — Z794 Long term (current) use of insulin: Secondary | ICD-10-CM

## 2021-05-20 DIAGNOSIS — E1165 Type 2 diabetes mellitus with hyperglycemia: Secondary | ICD-10-CM

## 2021-05-20 LAB — LIPID PANEL
Cholesterol: 178 mg/dL (ref 0–200)
HDL: 52.3 mg/dL (ref >39.00)
LDL Cholesterol: 95 mg/dL (ref 0–99)
NonHDL: 125.86
Total CHOL/HDL Ratio: 3
Triglycerides: 155 mg/dL — ABNORMAL HIGH (ref 0.0–149.0)
VLDL: 31 mg/dL (ref 0.0–40.0)

## 2021-05-20 LAB — TSH: TSH: 9.22 u[IU]/mL — ABNORMAL HIGH (ref 0.35–5.50)

## 2021-05-20 LAB — MICROALBUMIN / CREATININE URINE RATIO
Creatinine,U: 81 mg/dL
Microalb Creat Ratio: 5.1 mg/g (ref 0.0–30.0)
Microalb, Ur: 4.2 mg/dL — ABNORMAL HIGH (ref 0.0–1.9)

## 2021-05-20 LAB — GLUCOSE, POCT (MANUAL RESULT ENTRY): POC Glucose: 390 mg/dl — AB (ref 70–99)

## 2021-05-20 MED ORDER — PIOGLITAZONE HCL 30 MG PO TABS
30.0000 mg | ORAL_TABLET | Freq: Every day | ORAL | 3 refills | Status: DC
  Filled 2021-05-20: qty 30, 30d supply, fill #0

## 2021-05-20 MED ORDER — INSULIN PEN NEEDLE 32G X 4 MM MISC
1.0000 | Freq: Four times a day (QID) | 3 refills | Status: DC
  Filled 2021-05-20: qty 100, 25d supply, fill #0

## 2021-05-20 MED ORDER — HUMALOG KWIKPEN 200 UNIT/ML ~~LOC~~ SOPN
PEN_INJECTOR | SUBCUTANEOUS | 2 refills | Status: DC
  Filled 2021-05-20: qty 30, 30d supply, fill #0

## 2021-05-20 MED ORDER — LEVOTHYROXINE SODIUM 150 MCG PO TABS
150.0000 ug | ORAL_TABLET | Freq: Every day | ORAL | 3 refills | Status: DC
  Filled 2021-05-20: qty 30, 30d supply, fill #0

## 2021-05-20 MED ORDER — INSULIN ASPART 100 UNIT/ML FLEXPEN
PEN_INJECTOR | SUBCUTANEOUS | 6 refills | Status: DC
  Filled 2021-05-20: qty 30, 30d supply, fill #0

## 2021-05-20 MED ORDER — INSULIN GLARGINE 100 UNIT/ML SOLOSTAR PEN
40.0000 [IU] | PEN_INJECTOR | Freq: Every day | SUBCUTANEOUS | 6 refills | Status: DC
  Filled 2021-05-20: qty 12, 30d supply, fill #0
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
## 2021-05-20 NOTE — Patient Instructions (Signed)
Increase  lantus to  40  units daily  Continue Novolog  20 units with each meal  Continue Actos  30 mg  1 tablet daily     HOW TO TREAT LOW BLOOD SUGARS (Blood sugar LESS THAN 70 MG/DL) Please follow the RULE OF 15 for the treatment of hypoglycemia treatment (when your (blood sugars are less than 70 mg/dL)   STEP 1: Take 15 grams of carbohydrates when your blood sugar is low, which includes:  3-4 GLUCOSE TABS  OR 3-4 OZ OF JUICE OR REGULAR SODA OR ONE TUBE OF GLUCOSE GEL    STEP 2: RECHECK blood sugar in 15 MINUTES STEP 3: If your blood sugar is still low at the 15 minute recheck --> then, go back to STEP 1 and treat AGAIN with another 15 grams of carbohydrates.;

## 2021-05-21 ENCOUNTER — Other Ambulatory Visit: Payer: Self-pay

## 2021-05-21 MED ORDER — INSULIN LISPRO (1 UNIT DIAL) 100 UNIT/ML (KWIKPEN)
PEN_INJECTOR | SUBCUTANEOUS | 6 refills | Status: DC
  Filled 2021-05-21: qty 30, 30d supply, fill #0

## 2021-05-21 NOTE — Addendum Note (Signed)
Addended by: Dorita Sciara on: 05/21/2021 07:05 AM   Modules accepted: Orders
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
## 2021-05-24 ENCOUNTER — Telehealth: Payer: Self-pay

## 2021-05-24 ENCOUNTER — Other Ambulatory Visit: Payer: Self-pay

## 2021-05-24 ENCOUNTER — Encounter: Payer: Self-pay | Admitting: Critical Care Medicine

## 2021-05-24 ENCOUNTER — Ambulatory Visit: Payer: Medicaid Other | Attending: Critical Care Medicine | Admitting: Critical Care Medicine

## 2021-05-24 VITALS — BP 135/82 | HR 83 | Resp 16 | Wt 299.4 lb

## 2021-05-24 DIAGNOSIS — G43909 Migraine, unspecified, not intractable, without status migrainosus: Secondary | ICD-10-CM

## 2021-05-24 DIAGNOSIS — K3580 Unspecified acute appendicitis: Secondary | ICD-10-CM

## 2021-05-24 DIAGNOSIS — Z794 Long term (current) use of insulin: Secondary | ICD-10-CM

## 2021-05-24 DIAGNOSIS — Z23 Encounter for immunization: Secondary | ICD-10-CM

## 2021-05-24 DIAGNOSIS — I10 Essential (primary) hypertension: Secondary | ICD-10-CM

## 2021-05-24 DIAGNOSIS — E559 Vitamin D deficiency, unspecified: Secondary | ICD-10-CM

## 2021-05-24 DIAGNOSIS — E1169 Type 2 diabetes mellitus with other specified complication: Secondary | ICD-10-CM

## 2021-05-24 DIAGNOSIS — Z6841 Body Mass Index (BMI) 40.0 and over, adult: Secondary | ICD-10-CM

## 2021-05-24 DIAGNOSIS — R0789 Other chest pain: Secondary | ICD-10-CM

## 2021-05-24 DIAGNOSIS — L750 Bromhidrosis: Secondary | ICD-10-CM

## 2021-05-24 DIAGNOSIS — E785 Hyperlipidemia, unspecified: Secondary | ICD-10-CM

## 2021-05-24 DIAGNOSIS — F3176 Bipolar disorder, in full remission, most recent episode depressed: Secondary | ICD-10-CM

## 2021-05-24 DIAGNOSIS — E1165 Type 2 diabetes mellitus with hyperglycemia: Secondary | ICD-10-CM

## 2021-05-24 DIAGNOSIS — E890 Postprocedural hypothyroidism: Secondary | ICD-10-CM

## 2021-05-24 DIAGNOSIS — G8929 Other chronic pain: Secondary | ICD-10-CM

## 2021-05-24 DIAGNOSIS — M545 Low back pain, unspecified: Secondary | ICD-10-CM

## 2021-05-24 LAB — GLUCOSE, POCT (MANUAL RESULT ENTRY): POC Glucose: 364 mg/dl — AB (ref 70–99)

## 2021-05-24 MED ORDER — CITALOPRAM HYDROBROMIDE 40 MG PO TABS
40.0000 mg | ORAL_TABLET | Freq: Every day | ORAL | 3 refills | Status: DC
  Filled 2021-05-24 – 2021-06-03 (×2): qty 30, 30d supply, fill #0

## 2021-05-24 MED ORDER — DAPAGLIFLOZIN PROPANEDIOL 10 MG PO TABS
10.0000 mg | ORAL_TABLET | Freq: Every day | ORAL | 3 refills | Status: DC
  Filled 2021-05-24 – 2021-06-03 (×2): qty 30, 30d supply, fill #0

## 2021-05-24 MED ORDER — GABAPENTIN 300 MG PO CAPS
600.0000 mg | ORAL_CAPSULE | Freq: Two times a day (BID) | ORAL | 3 refills | Status: DC
  Filled 2021-05-24 – 2021-06-03 (×2): qty 120, 30d supply, fill #0

## 2021-05-24 MED ORDER — SENNA-DOCUSATE SODIUM 8.6-50 MG PO TABS
3.0000 | ORAL_TABLET | Freq: Every day | ORAL | 2 refills | Status: DC
  Filled 2021-05-24: qty 90, 30d supply, fill #0

## 2021-05-24 MED ORDER — OMEPRAZOLE 40 MG PO CPDR
40.0000 mg | DELAYED_RELEASE_CAPSULE | Freq: Two times a day (BID) | ORAL | 3 refills | Status: DC
  Filled 2021-05-24: qty 60, 30d supply, fill #0

## 2021-05-24 MED ORDER — CLINDAMYCIN PHOS-BENZOYL PEROX 1-5 % EX GEL
Freq: Two times a day (BID) | CUTANEOUS | 4 refills | Status: DC
  Filled 2021-05-24: qty 25, 10d supply, fill #0
  Filled 2021-06-03: qty 25, 30d supply, fill #0

## 2021-05-24 MED ORDER — PROPRANOLOL HCL 40 MG PO TABS
40.0000 mg | ORAL_TABLET | Freq: Two times a day (BID) | ORAL | 4 refills | Status: DC
  Filled 2021-05-24 – 2021-06-03 (×2): qty 60, 30d supply, fill #0

## 2021-05-24 MED ORDER — ATORVASTATIN CALCIUM 10 MG PO TABS
10.0000 mg | ORAL_TABLET | Freq: Every day | ORAL | 2 refills | Status: DC
  Filled 2021-05-24 – 2021-06-03 (×3): qty 30, 30d supply, fill #0

## 2021-05-24 MED ORDER — VITAMIN D (ERGOCALCIFEROL) 1.25 MG (50000 UNIT) PO CAPS
50000.0000 [IU] | ORAL_CAPSULE | ORAL | 3 refills | Status: DC
  Filled 2021-05-24: qty 4, 28d supply, fill #0
  Filled 2021-06-03: qty 12, 84d supply, fill #0

## 2021-05-24 NOTE — Assessment & Plan Note (Signed)
Blood pressure well controlled no changes made ?

## 2021-05-24 NOTE — Assessment & Plan Note (Signed)
Continue thyroid supplement

## 2021-05-24 NOTE — Patient Instructions (Signed)
Stop pioglitazone and begin Loaza 1 daily for diabetes  Refills on all your other medications sent to our pharmacy  Prevnar 20 pneumonia vaccine was given  Follow-up abdominal bloating protocol increase hydration and fiber in your diet  Follow healthy diet as attached you need access to fresh vegetables and fruits, we will connect you with our clinical nurse case manager for food options  Return to see Dr. Joya Gaskins 6 weeks  You may resume the BenzaClin's ointment to the groin area for your bromhidrosis

## 2021-05-24 NOTE — Assessment & Plan Note (Signed)
Poorly controlled diabetes A1c of 13  Continue Lantus 40 units daily Humalog 20 units 3 times daily with food discontinue Actos and begin Farxiga 10 mg daily  Patient instructed as to a healthy diet  Return to see clinical pharmacy in the next 2 weeks

## 2021-05-24 NOTE — Assessment & Plan Note (Signed)
Acute appendicitis now resolved

## 2021-05-24 NOTE — Assessment & Plan Note (Signed)
Wound has improved therefore will allow patient to resume her BenzaClin's ointment

## 2021-05-24 NOTE — Telephone Encounter (Signed)
Met with patient when she was in the clinic today. Provided her with information about local food pantries and Out of the [place] [employer]. Instructed her to call this CM if she has any questions.

## 2021-05-24 NOTE — Assessment & Plan Note (Signed)
Educated as to proper diet

## 2021-05-24 NOTE — Assessment & Plan Note (Signed)
Renew vitamin D supplement

## 2021-05-31 ENCOUNTER — Other Ambulatory Visit: Payer: Self-pay

## 2021-06-03 ENCOUNTER — Other Ambulatory Visit: Payer: Self-pay

## 2021-06-11 ENCOUNTER — Emergency Department (HOSPITAL_COMMUNITY)
Admission: EM | Admit: 2021-06-11 | Discharge: 2021-06-11 | Disposition: A | Discharge status: Home / Self Care | Payer: Medicaid Other | Attending: Emergency Medicine | Admitting: Emergency Medicine

## 2021-06-11 ENCOUNTER — Encounter (HOSPITAL_COMMUNITY): Payer: Self-pay | Admitting: Emergency Medicine

## 2021-06-11 DIAGNOSIS — Z79899 Other long term (current) drug therapy: Secondary | ICD-10-CM | POA: Insufficient documentation

## 2021-06-11 DIAGNOSIS — T162XXA Foreign body in left ear, initial encounter: Secondary | ICD-10-CM | POA: Insufficient documentation

## 2021-06-11 DIAGNOSIS — E039 Hypothyroidism, unspecified: Secondary | ICD-10-CM | POA: Insufficient documentation

## 2021-06-11 DIAGNOSIS — X58XXXA Exposure to other specified factors, initial encounter: Secondary | ICD-10-CM | POA: Insufficient documentation

## 2021-06-11 DIAGNOSIS — E119 Type 2 diabetes mellitus without complications: Secondary | ICD-10-CM | POA: Insufficient documentation

## 2021-06-11 DIAGNOSIS — Z85850 Personal history of malignant neoplasm of thyroid: Secondary | ICD-10-CM | POA: Insufficient documentation

## 2021-06-11 DIAGNOSIS — I10 Essential (primary) hypertension: Secondary | ICD-10-CM | POA: Insufficient documentation

## 2021-06-11 DIAGNOSIS — Z794 Long term (current) use of insulin: Secondary | ICD-10-CM | POA: Insufficient documentation

## 2021-06-11 NOTE — ED Provider Notes (Signed)
[facility] DEPT Provider Note   CSN: [id number] Arrival date & time: 06/11/21  1340     History Chief Complaint  Patient presents with   Foreign Body in [place] is a 50 y.o. female.  50 year old female with prior medical history as detailed below presents for evaluation.  Patient reports that she was cleaning her ear with a Q-tip earlier today.  The cotton tip came off the Q-tip and remained in the left ear canal.  She was able to remove it herself.  She denies other complaint.  The history is provided by the patient.  Foreign Body in Ear This is a new problem. The current episode started 3 to 5 hours ago. The problem occurs rarely. The problem has not changed since onset.Nothing aggravates the symptoms.      Past Medical History:  Diagnosis Date   Allergy    Anemia    Anxiety    Arthritis    "knees" (09/30/2015)   Bipolar disorder ([place])    Depression    Diabetes mellitus without complication ([place])    Fibroid    s/p myomectomy 2010, hysterectomy 2013   GERD (gastroesophageal reflux disease)    Grave's disease    Heart murmur    Hormone disorder    Hyperlipidemia    Hypertension    Hypothyroidism    Memory loss    "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   Migraine     otc med prn   PCOS (polycystic ovarian syndrome)    PMS (premenstrual syndrome)    PTSD (post-traumatic stress disorder)    Seasonal allergies    Thyroid cancer ([place]) 2005   PAST HX   Thyroid disease    Type II diabetes mellitus ([place])    Vertigo     Patient Active Problem List   Diagnosis Date Noted   Bipolar 1 disorder, depressed, full remission ([place]) 10/19/2020   Overactive bladder 08/20/2020   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   Heme positive stool 07/15/2020   Morbid obesity with BMI of 40.0-44.9, adult ([place]) 07/09/2020   Bipolar 1 disorder, depressed, moderate ([place]) 02/10/2020   Breast pain, left 06/11/2019   Radiculopathy,  cervical region 11/21/2018   Carpal tunnel syndrome, right upper limb 10/08/2018   Carpal tunnel syndrome, left upper limb 10/08/2018   Hypertension 09/30/2015   Type 2 diabetes mellitus with hyperglycemia, with long-term current use of insulin ([place]) 09/30/2015   S/P cholecystectomy 09/05/2013   Borderline behavior 03/14/2012    Class: Chronic   OCD (obsessive compulsive disorder) 03/14/2012    Class: Chronic   PTSD (post-traumatic stress disorder) 03/14/2012    Class: Chronic   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   ABDOMINAL HYSTERECTOMY     CHOLECYSTECTOMY N/A 09/06/2013   Procedure: LAPAROSCOPIC CHOLECYSTECTOMY WITH INTRAOPERATIVE CHOLANGIOGRAM;  Surgeon: Merrie Roof, MD;  Location: [facility];  Service: General;  Laterality: N/A;   COLONOSCOPY  DIAGNOSTIC LAPAROSCOPY     DILATION AND CURETTAGE OF UTERUS     HERNIA REPAIR     LAPAROSCOPIC APPENDECTOMY N/A 04/10/2021   Procedure: APPENDECTOMY LAPAROSCOPIC;  Surgeon: Michael Boston, MD;  Location: [facility];  Service: General;  Laterality: N/A;   MYOMECTOMY  06/13/2008   LAPAROSCOPY   thyroid removed     TOTAL THYROIDECTOMY  64/40/3474   UMBILICAL HERNIA REPAIR  06/13/1980     OB History     Gravida  2   Para  0   Term  0   Preterm  0   AB  2   Living         SAB  2   IAB  0   Ectopic  0   Multiple      Live Births  0           Family History  Problem Relation Age of  Onset   Thyroid disease Mother    Colon polyps Father    Diabetes Father    Hypertension Father    Heart disease Father    Heart attack Father    Cancer Maternal Aunt        BRAIN   Stomach cancer Maternal Grandmother    Colon cancer Maternal Grandmother    Cancer Maternal Grandmother        LYMPHOMA   Hypertension Maternal Grandmother    Colon cancer Maternal Grandfather    Hypertension Maternal Grandfather    Diabetes Maternal Grandfather    Cancer Paternal Grandmother        PANCREATIC   Diabetes Paternal Grandmother    Hypertension Paternal Grandmother    Cancer Paternal Grandfather    Hypertension Paternal Grandfather    Pancreatic cancer Paternal Grandfather    Esophageal cancer Neg Hx    Rectal cancer Neg Hx     Social History   Tobacco Use   Smoking status: Never   Smokeless tobacco: Never  Vaping Use   Vaping Use: Never used  Substance Use Topics   Alcohol use: Never   Drug use: Never    Home Medications Prior to Admission medications   Medication Sig Start Date End Date Taking? Authorizing Provider  atorvastatin (LIPITOR) 10 MG tablet Take 1 tablet (10 mg total) by mouth at bedtime. 05/24/21 05/24/22  Elsie Stain, MD  citalopram (CELEXA) 40 MG tablet Take 1 tablet (40 mg total) by mouth daily. 05/24/21   Elsie Stain, MD  clindamycin-benzoyl peroxide [facility]) gel Apply topically 2 (two) times daily. 05/24/21 05/24/22  Elsie Stain, MD  Continuous Blood Gluc Transmit (DEXCOM G6 TRANSMITTER) MISC 1 Device by Does not apply route as directed. 08/17/20   Shamleffer, Melanie Crazier, MD  cyclobenzaprine (FLEXERIL) 5 MG tablet Take 5 mg by mouth 3 (three) times daily as needed. 05/20/21   [provider]  dapagliflozin propanediol (FARXIGA) 10 MG TABS tablet Take 1 tablet (10 mg total) by mouth daily before breakfast. 05/24/21   Elsie Stain, MD  gabapentin (NEURONTIN) 300 MG capsule Take 2 capsules (600 mg total) by mouth 2 (two)  times daily. 05/24/21 05/24/22  Elsie Stain, MD  GEMTESA 75 MG TABS Take 75 mg by mouth at bedtime. Patient not taking: Reported on 05/24/2021    [provider]  hydrOXYzine (ATARAX/VISTARIL) 25 MG tablet TAKE 1 TABLET (25 MG TOTAL) BY MOUTH 2 (TWO) TIMES DAILY. Patient taking differently: Take 25-50 mg by mouth See admin instructions. Take 25 mg by mouth  in the morning and 50 mg at bedtime 03/17/21   Eulis Canner E, NP  Insulin Disposable Pump (OMNIPOD DASH 5 PACK PODS) MISC USE 1 DEVICE AS DIRECTED. Patient not taking: Reported on 04/09/2021 08/12/20 08/12/21  Shamleffer, Melanie Crazier, MD  insulin glargine (LANTUS) 100 UNIT/ML Solostar Pen Inject 40 Units into the skin at bedtime. 05/20/21   Shamleffer, Melanie Crazier, MD  insulin lispro (HUMALOG KWIKPEN) 100 UNIT/ML KwikPen Inject maximum of 100 units units into the skin daily 05/21/21   Shamleffer, Melanie Crazier, MD  Insulin Pen Needle 32G X 4 MM MISC Inject 1 Device into the skin in the morning, at noon, in the evening, and at bedtime. 05/20/21   Shamleffer, Melanie Crazier, MD  levothyroxine (SYNTHROID) 150 MCG tablet Take 1 tablet (150 mcg total) by mouth daily. 05/20/21   Shamleffer, Melanie Crazier, MD  lithium carbonate (ESKALITH) 450 MG CR tablet TAKE 2 CAPSULES AT BEDTIME Patient taking differently: Take 900 mg by mouth at bedtime. 03/17/21   Salley Slaughter, NP  loperamide (IMODIUM) 2 MG capsule Take 1 capsule (2 mg total) by mouth 4 (four) times daily as needed for diarrhea or loose stools. Patient not taking: Reported on 05/24/2021 05/12/21   Dorie Rank, MD  lurasidone (LATUDA) 80 MG TABS tablet TAKE 1 TABLET (80 MG TOTAL) BY MOUTH AT BEDTIME. Patient taking differently: Take 80 mg by mouth at bedtime. 03/17/21   Salley Slaughter, NP  methocarbamol (ROBAXIN) 500 MG tablet TAKE 1 TABLET (500 MG TOTAL) BY MOUTH 3 (THREE) TIMES DAILY AS NEEDED FOR MUSCLE SPASMS. Patient taking differently: Take 1,500 mg by mouth  daily as needed for muscle spasms. 01/19/21 01/19/22  Elsie Stain, MD  Multiple Vitamins-Minerals (ONE-A-DAY WOMENS PO) Take 1 tablet by mouth daily with breakfast.    [provider]  omeprazole (PRILOSEC) 40 MG capsule Take 1 capsule (40 mg total) by mouth 2 (two) times daily before a meal. 05/24/21 05/24/22  Elsie Stain, MD  ondansetron (ZOFRAN) 4 MG tablet Take 1 tablet (4 mg total) by mouth every 8 (eight) hours as needed for nausea. Patient not taking: Reported on 05/24/2021 04/10/21   Michael Boston, MD  prazosin (MINIPRESS) 1 MG capsule TAKE 1 CAPSULE (1 MG TOTAL) BY MOUTH AT BEDTIME. FOR SLEEP/NIGHTMARES Patient taking differently: Take 1 mg by mouth at bedtime. 03/17/21   Salley Slaughter, NP  propranolol (INDERAL) 40 MG tablet Take 1 tablet (40 mg total) by mouth in the morning and at bedtime. 05/24/21 05/24/22  Elsie Stain, MD  sennosides-docusate sodium (SENOKOT-S) 8.6-50 MG tablet Take 3 tablets by mouth daily. 05/24/21   Elsie Stain, MD  Vitamin D, Ergocalciferol, (DRISDOL) 1.25 MG (50000 UNIT) CAPS capsule Take 1 capsule (50,000 Units total) by mouth every Saturday. 05/29/21 05/29/22  Elsie Stain, MD  Wheat Dextrin (BENEFIBER) POWD Take 1 packet by mouth every other day.    [provider]    Allergies    Bee venom, Morphine, Tape, Codeine, Golytely [peg 3350-electrolytes], Ibuprofen, Metformin and related, Penicillins, and Aspirin  Review of Systems   Review of Systems  All other systems reviewed and are negative.  Physical Exam Updated Vital Signs BP 109/71 (BP Location: Left Arm)    Pulse 82    Temp 98 F (36.7 C) (Oral)    Resp 18    LMP 09/20/2011    SpO2 94%   Physical Exam Vitals and nursing note reviewed.  Constitutional:      General:  She is not in acute distress.    Appearance: Normal appearance. She is well-developed.  HENT:     Head: Normocephalic and atraumatic.     Right Ear: Tympanic membrane normal.      Ears:     Comments: Cotton material from Q-tip in left ear canal    Nose: Nose normal.     Mouth/Throat:     Mouth: Mucous membranes are moist.  Eyes:     Conjunctiva/sclera: Conjunctivae normal.     Pupils: Pupils are equal, round, and reactive to light.  Cardiovascular:     Rate and Rhythm: Normal rate and regular rhythm.     Heart sounds: Normal heart sounds.  Pulmonary:     Effort: Pulmonary effort is normal. No respiratory distress.     Breath sounds: Normal breath sounds.  Abdominal:     General: There is no distension.     Palpations: Abdomen is soft.     Tenderness: There is no abdominal tenderness.  Musculoskeletal:        General: No deformity. Normal range of motion.     Cervical back: Normal range of motion and neck supple.  Skin:    General: Skin is warm and dry.  Neurological:     General: No focal deficit present.     Mental Status: She is alert and oriented to person, place, and time.    ED Results / Procedures / Treatments   Labs (all labs ordered are listed, but only abnormal results are displayed) Labs Reviewed - No data to display  EKG None  Radiology No results found.  Procedures .Foreign Body Removal  Date/Time: 06/11/2021 3:00 PM Performed by: Valarie Merino, MD Authorized by: Valarie Merino, MD  Consent: Verbal consent obtained. Written consent obtained. Risks and benefits: risks, benefits and alternatives were discussed Consent given by: patient Patient understanding: patient states understanding of the procedure being performed Imaging studies: imaging studies available Required items: required blood products, implants, devices, and special equipment available Patient identity confirmed: verbally with patient Time out: Immediately prior to procedure a "time out" was called to verify the correct patient, procedure, equipment, support staff and site/side marked as required. Body area: ear Location details: left  ear  Sedation: Patient sedated: no  Patient restrained: no Patient cooperative: yes Removal mechanism: alligator forceps Complexity: simple 1 objects recovered. Objects recovered: Q-tip tip Post-procedure assessment: foreign body removed    Medications Ordered in ED Medications - No data to display  ED Course  I have reviewed the triage vital signs and the nursing notes.  Pertinent labs & imaging results that were available during my care of the patient were reviewed by me and considered in my medical decision making (see chart for details).    MDM Rules/Calculators/A&P                         MDM  MSE complete  Elley Harp [name] was evaluated in Emergency Department on 06/11/2021 for the symptoms described in the history of present illness. She was evaluated in the context of the global COVID-19 pandemic, which necessitated consideration that the patient might be at risk for infection with the SARS-CoV-2 virus that causes COVID-19. Institutional protocols and algorithms that pertain to the evaluation of patients at risk for COVID-19 are in a state of rapid change based on information released by regulatory bodies including the CDC and federal and state organizations. These policies and algorithms were followed during  the patient's care in the ED.  Patient presented with complaint of retained Q-tip tip in left ear canal.  Foreign body removed without difficulty.  Patient tolerated this well.  Patient does understand need for close follow-up.  Strict return precautions given understood.     Final Clinical Impression(s) / ED Diagnoses Final diagnoses:  Foreign body of left ear, initial encounter    Rx / DC Orders ED Discharge Orders     None        Valarie Merino, MD 06/11/21 [phone]

## 2021-06-11 NOTE — Discharge Instructions (Signed)
Return for any problem.  ?

## 2021-06-11 NOTE — ED Triage Notes (Signed)
PT c/o qtip in L ear since this morning. Denies pain.

## 2021-06-13 ENCOUNTER — Emergency Department (HOSPITAL_COMMUNITY)
Admission: EM | Admit: 2021-06-13 | Discharge: 2021-06-13 | Disposition: A | Discharge status: Home / Self Care | Payer: Self-pay | Attending: Emergency Medicine | Admitting: Emergency Medicine

## 2021-06-13 ENCOUNTER — Encounter (HOSPITAL_COMMUNITY): Payer: Self-pay

## 2021-06-13 ENCOUNTER — Other Ambulatory Visit: Payer: Self-pay

## 2021-06-13 DIAGNOSIS — Z20822 Contact with and (suspected) exposure to covid-19: Secondary | ICD-10-CM

## 2021-06-13 DIAGNOSIS — I10 Essential (primary) hypertension: Secondary | ICD-10-CM | POA: Insufficient documentation

## 2021-06-13 DIAGNOSIS — U071 COVID-19: Secondary | ICD-10-CM | POA: Insufficient documentation

## 2021-06-13 DIAGNOSIS — Z794 Long term (current) use of insulin: Secondary | ICD-10-CM | POA: Insufficient documentation

## 2021-06-13 DIAGNOSIS — E119 Type 2 diabetes mellitus without complications: Secondary | ICD-10-CM | POA: Insufficient documentation

## 2021-06-13 DIAGNOSIS — Z79899 Other long term (current) drug therapy: Secondary | ICD-10-CM | POA: Insufficient documentation

## 2021-06-13 LAB — RESP PANEL BY RT-PCR (FLU A&B, COVID) ARPGX2
Influenza A by PCR: NEGATIVE
Influenza B by PCR: NEGATIVE
SARS Coronavirus 2 by RT PCR: POSITIVE — AB

## 2021-06-13 MED ORDER — MOLNUPIRAVIR EUA 200MG CAPSULE
4.0000 | ORAL_CAPSULE | Freq: Two times a day (BID) | ORAL | 0 refills | Status: AC
  Filled 2021-06-13: qty 40, 5d supply, fill #0

## 2021-06-13 NOTE — ED Triage Notes (Signed)
Pt sts covid exposure to spouse. Thought she would check in while spouse is being evaluated. Cough x 3 days.

## 2021-06-13 NOTE — Discharge Instructions (Addendum)
Take molnupirovir as directed  Your covid test will result within the next few hours. Please follow up on this result in your mychart  Please follow up with your primary care provider within 5-7 days for re-evaluation of your symptoms. If you do not have a primary care provider, information for a healthcare clinic has been provided for you to make arrangements for follow up care. Please return to the emergency department for any new or worsening symptoms.

## 2021-06-13 NOTE — ED Provider Notes (Signed)
[facility] DEPT Provider Note   CSN: [id number] Arrival date & time: 06/13/21  0133     History  Chief Complaint  Patient presents with   Covid Exposure    Lynn Martinez is a 51 y.o. female.  HPI  51 year old female with a history of hypertension, diabetes, morbid obesity, who presents to the emergency department today for evaluation of COVID.  Patient states that she tested positive for COVID earlier today.  She reports that her husband recently tested positive as well.  She has had symptoms for the last 3 days.  She reports nasal congestion, rhinorrhea, cough, fevers and chills.  Home Medications Prior to Admission medications   Medication Sig Start Date End Date Taking? Authorizing Provider  molnupiravir EUA (LAGEVRIO) 200 mg CAPS capsule Take 4 capsules (800 mg total) by mouth 2 (two) times daily for 5 days. 06/13/21 06/18/21 Yes Brihana Quickel S, PA-C  atorvastatin (LIPITOR) 10 MG tablet Take 1 tablet (10 mg total) by mouth at bedtime. 05/24/21 05/24/22  Elsie Stain, MD  citalopram (CELEXA) 40 MG tablet Take 1 tablet (40 mg total) by mouth daily. 05/24/21   Elsie Stain, MD  clindamycin-benzoyl peroxide [facility]) gel Apply topically 2 (two) times daily. 05/24/21 05/24/22  Elsie Stain, MD  Continuous Blood Gluc Transmit (DEXCOM G6 TRANSMITTER) MISC 1 Device by Does not apply route as directed. 08/17/20   Shamleffer, Melanie Crazier, MD  cyclobenzaprine (FLEXERIL) 5 MG tablet Take 5 mg by mouth 3 (three) times daily as needed. 05/20/21   [provider]  dapagliflozin propanediol (FARXIGA) 10 MG TABS tablet Take 1 tablet (10 mg total) by mouth daily before breakfast. 05/24/21   Elsie Stain, MD  gabapentin (NEURONTIN) 300 MG capsule Take 2 capsules (600 mg total) by mouth 2 (two) times daily. 05/24/21 05/24/22  Elsie Stain, MD  GEMTESA 75 MG TABS Take 75 mg by mouth at bedtime. Patient not taking:  Reported on 05/24/2021    [provider]  hydrOXYzine (ATARAX/VISTARIL) 25 MG tablet TAKE 1 TABLET (25 MG TOTAL) BY MOUTH 2 (TWO) TIMES DAILY. Patient taking differently: Take 25-50 mg by mouth See admin instructions. Take 25 mg by mouth in the morning and 50 mg at bedtime 03/17/21   Eulis Canner E, NP  Insulin Disposable Pump (OMNIPOD DASH 5 PACK PODS) MISC USE 1 DEVICE AS DIRECTED. Patient not taking: Reported on 04/09/2021 08/12/20 08/12/21  Shamleffer, Melanie Crazier, MD  insulin glargine (LANTUS) 100 UNIT/ML Solostar Pen Inject 40 Units into the skin at bedtime. 05/20/21   Shamleffer, Melanie Crazier, MD  insulin lispro (HUMALOG KWIKPEN) 100 UNIT/ML KwikPen Inject maximum of 100 units units into the skin daily 05/21/21   Shamleffer, Melanie Crazier, MD  Insulin Pen Needle 32G X 4 MM MISC Inject 1 Device into the skin in the morning, at noon, in the evening, and at bedtime. 05/20/21   Shamleffer, Melanie Crazier, MD  levothyroxine (SYNTHROID) 150 MCG tablet Take 1 tablet (150 mcg total) by mouth daily. 05/20/21   Shamleffer, Melanie Crazier, MD  lithium carbonate (ESKALITH) 450 MG CR tablet TAKE 2 CAPSULES AT BEDTIME Patient taking differently: Take 900 mg by mouth at bedtime. 03/17/21   Salley Slaughter, NP  loperamide (IMODIUM) 2 MG capsule Take 1 capsule (2 mg total) by mouth 4 (four) times daily as needed for diarrhea or loose stools. Patient not taking: Reported on 05/24/2021 05/12/21   Dorie Rank, MD  lurasidone (LATUDA) 80 MG TABS  tablet TAKE 1 TABLET (80 MG TOTAL) BY MOUTH AT BEDTIME. Patient taking differently: Take 80 mg by mouth at bedtime. 03/17/21   Salley Slaughter, NP  methocarbamol (ROBAXIN) 500 MG tablet TAKE 1 TABLET (500 MG TOTAL) BY MOUTH 3 (THREE) TIMES DAILY AS NEEDED FOR MUSCLE SPASMS. Patient taking differently: Take 1,500 mg by mouth daily as needed for muscle spasms. 01/19/21 01/19/22  Elsie Stain, MD  Multiple Vitamins-Minerals (ONE-A-DAY WOMENS PO) Take 1  tablet by mouth daily with breakfast.    [provider]  omeprazole (PRILOSEC) 40 MG capsule Take 1 capsule (40 mg total) by mouth 2 (two) times daily before a meal. 05/24/21 05/24/22  Elsie Stain, MD  ondansetron (ZOFRAN) 4 MG tablet Take 1 tablet (4 mg total) by mouth every 8 (eight) hours as needed for nausea. Patient not taking: Reported on 05/24/2021 04/10/21   Michael Boston, MD  prazosin (MINIPRESS) 1 MG capsule TAKE 1 CAPSULE (1 MG TOTAL) BY MOUTH AT BEDTIME. FOR SLEEP/NIGHTMARES Patient taking differently: Take 1 mg by mouth at bedtime. 03/17/21   Salley Slaughter, NP  propranolol (INDERAL) 40 MG tablet Take 1 tablet (40 mg total) by mouth in the morning and at bedtime. 05/24/21 05/24/22  Elsie Stain, MD  sennosides-docusate sodium (SENOKOT-S) 8.6-50 MG tablet Take 3 tablets by mouth daily. 05/24/21   Elsie Stain, MD  Vitamin D, Ergocalciferol, (DRISDOL) 1.25 MG (50000 UNIT) CAPS capsule Take 1 capsule (50,000 Units total) by mouth every Saturday. 05/29/21 05/29/22  Elsie Stain, MD  Wheat Dextrin (BENEFIBER) POWD Take 1 packet by mouth every other day.    [provider]      Allergies    Bee venom, Morphine, Tape, Codeine, Golytely [peg 3350-electrolytes], Ibuprofen, Metformin and related, Penicillins, and Aspirin    Review of Systems   Review of Systems  Constitutional:  Positive for chills, diaphoresis and fever.  HENT:  Positive for congestion and rhinorrhea.   Eyes:  Negative for visual disturbance.  Respiratory:  Positive for cough. Negative for shortness of breath.   Cardiovascular:  Negative for chest pain.  Gastrointestinal:  Positive for vomiting (1 episode, posttussive). Negative for abdominal pain, constipation, diarrhea and nausea.  Genitourinary:  Negative for dysuria and hematuria.  Musculoskeletal:  Negative for back pain.  Skin:  Negative for rash.  Neurological:  Negative for syncope.  All other systems reviewed and are  negative.  Physical Exam Updated Vital Signs BP (!) 152/101 (BP Location: Right Arm)    Pulse 98    Temp 98.5 F (36.9 C) (Oral)    Resp 18    Ht 5\' 10"  (1.778 m)    Wt 136.1 kg    LMP 09/20/2011    SpO2 94%    BMI 43.05 kg/m  Physical Exam Vitals and nursing note reviewed.  Constitutional:      General: She is not in acute distress.    Appearance: She is well-developed.  HENT:     Head: Normocephalic and atraumatic.  Eyes:     Conjunctiva/sclera: Conjunctivae normal.  Cardiovascular:     Rate and Rhythm: Normal rate and regular rhythm.     Heart sounds: Normal heart sounds. No murmur heard. Pulmonary:     Effort: Pulmonary effort is normal. No respiratory distress.     Breath sounds: Normal breath sounds.  Abdominal:     Palpations: Abdomen is soft.     Tenderness: There is no abdominal tenderness.  Musculoskeletal:  General: No swelling.     Cervical back: Neck supple.  Skin:    General: Skin is warm and dry.     Capillary Refill: Capillary refill takes less than 2 seconds.  Neurological:     Mental Status: She is alert.  Psychiatric:        Mood and Affect: Mood normal.    ED Results / Procedures / Treatments   Labs (all labs ordered are listed, but only abnormal results are displayed) Labs Reviewed  RESP PANEL BY RT-PCR (FLU A&B, COVID) [id number]    EKG None  Radiology No results found.  Procedures Procedures    Medications Ordered in ED Medications - No data to display  ED Course/ Medical Decision Making/ A&P                           Medical Decision Making  Patient presenting for evaluation for Covid.  Reports symptoms ongoing for 3 days.  Patient nontoxic, well-appearing, no distress.  Vital signs are reassuring.   Tested for Covid in the ED. Results pending at the time of discharge. Pt did have positive test at home. Advised on quarantine measures. Will give Rx for symptomatic management as well as molnupirovir. Pt not a good candidate for  paxlovid due to multiple drug interactions.  Advised on f/u and return precautions. Pt voiced understanding of the plan and reasons to return. All questions answered, pt stable for d/c.   Final Clinical Impression(s) / ED Diagnoses Final diagnoses:  Suspected COVID-19 virus infection    Rx / DC Orders ED Discharge Orders          Ordered    molnupiravir EUA (LAGEVRIO) 200 mg CAPS capsule  2 times daily        06/13/21 0220              Rodney Booze, PA-C 06/13/21 0226    Ripley Fraise, MD 06/13/21 [phone]

## 2021-06-14 ENCOUNTER — Other Ambulatory Visit: Payer: Self-pay

## 2021-06-15 ENCOUNTER — Other Ambulatory Visit: Payer: Self-pay

## 2021-06-21 ENCOUNTER — Telehealth (INDEPENDENT_AMBULATORY_CARE_PROVIDER_SITE_OTHER): Payer: No Payment, Other | Admitting: Psychiatry

## 2021-06-21 ENCOUNTER — Other Ambulatory Visit: Payer: Self-pay

## 2021-06-21 ENCOUNTER — Encounter (HOSPITAL_COMMUNITY): Payer: Self-pay | Admitting: Psychiatry

## 2021-06-21 DIAGNOSIS — F3162 Bipolar disorder, current episode mixed, moderate: Secondary | ICD-10-CM | POA: Insufficient documentation

## 2021-06-21 DIAGNOSIS — F515 Nightmare disorder: Secondary | ICD-10-CM

## 2021-06-21 DIAGNOSIS — F411 Generalized anxiety disorder: Secondary | ICD-10-CM | POA: Diagnosis not present

## 2021-06-21 MED ORDER — HYDROXYZINE HCL 25 MG PO TABS
25.0000 mg | ORAL_TABLET | Freq: Three times a day (TID) | ORAL | 3 refills | Status: DC
  Filled 2021-06-21 – 2021-07-26 (×2): qty 90, 30d supply, fill #0
  Filled 2021-09-03: qty 90, 30d supply, fill #1
  Filled 2021-12-02: qty 90, 30d supply, fill #2

## 2021-06-21 MED ORDER — LURASIDONE HCL 120 MG PO TABS
120.0000 mg | ORAL_TABLET | Freq: Every day | ORAL | 3 refills | Status: DC
  Filled 2021-06-21 – 2021-06-30 (×2): qty 30, 30d supply, fill #0
  Filled 2021-08-09: qty 30, 30d supply, fill #1
  Filled 2021-09-16: qty 30, 30d supply, fill #2

## 2021-06-21 MED ORDER — LITHIUM CARBONATE ER 450 MG PO TBCR
EXTENDED_RELEASE_TABLET | Freq: Every day | ORAL | 3 refills | Status: DC
  Filled 2021-06-21 – 2021-06-30 (×2): qty 60, 30d supply, fill #0
  Filled 2021-08-09: qty 60, 30d supply, fill #1
  Filled 2021-09-16: qty 60, 30d supply, fill #2

## 2021-06-21 MED ORDER — PRAZOSIN HCL 1 MG PO CAPS
ORAL_CAPSULE | ORAL | 3 refills | Status: DC
  Filled 2021-06-21 – 2021-07-09 (×2): qty 30, 30d supply, fill #0
  Filled 2021-08-09: qty 30, 30d supply, fill #1
  Filled 2021-09-16: qty 30, 30d supply, fill #2

## 2021-06-21 MED ORDER — CITALOPRAM HYDROBROMIDE 40 MG PO TABS
40.0000 mg | ORAL_TABLET | Freq: Every day | ORAL | 3 refills | Status: DC
  Filled 2021-06-21 – 2021-08-09 (×2): qty 30, 30d supply, fill #0
  Filled 2021-09-03 – 2021-09-16 (×2): qty 30, 30d supply, fill #1
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
## 2021-06-22 ENCOUNTER — Other Ambulatory Visit: Payer: Self-pay

## 2021-06-25 ENCOUNTER — Other Ambulatory Visit: Payer: Self-pay

## 2021-06-28 ENCOUNTER — Other Ambulatory Visit: Payer: Self-pay

## 2021-06-30 ENCOUNTER — Other Ambulatory Visit: Payer: Self-pay

## 2021-07-09 ENCOUNTER — Other Ambulatory Visit: Payer: Self-pay
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
## 2021-07-12 ENCOUNTER — Other Ambulatory Visit: Payer: Self-pay

## 2021-07-12 ENCOUNTER — Encounter: Payer: Self-pay | Admitting: Critical Care Medicine

## 2021-07-12 ENCOUNTER — Ambulatory Visit: Payer: Self-pay | Attending: Critical Care Medicine | Admitting: Critical Care Medicine

## 2021-07-12 VITALS — BP 135/85 | HR 58 | Resp 16 | Wt 288.2 lb

## 2021-07-12 DIAGNOSIS — Z6841 Body Mass Index (BMI) 40.0 and over, adult: Secondary | ICD-10-CM

## 2021-07-12 DIAGNOSIS — G8929 Other chronic pain: Secondary | ICD-10-CM

## 2021-07-12 DIAGNOSIS — F3132 Bipolar disorder, current episode depressed, moderate: Secondary | ICD-10-CM

## 2021-07-12 DIAGNOSIS — R0789 Other chest pain: Secondary | ICD-10-CM

## 2021-07-12 DIAGNOSIS — E890 Postprocedural hypothyroidism: Secondary | ICD-10-CM

## 2021-07-12 DIAGNOSIS — E559 Vitamin D deficiency, unspecified: Secondary | ICD-10-CM

## 2021-07-12 DIAGNOSIS — E1165 Type 2 diabetes mellitus with hyperglycemia: Secondary | ICD-10-CM

## 2021-07-12 DIAGNOSIS — M545 Low back pain, unspecified: Secondary | ICD-10-CM

## 2021-07-12 DIAGNOSIS — L750 Bromhidrosis: Secondary | ICD-10-CM

## 2021-07-12 DIAGNOSIS — E785 Hyperlipidemia, unspecified: Secondary | ICD-10-CM

## 2021-07-12 DIAGNOSIS — I10 Essential (primary) hypertension: Secondary | ICD-10-CM

## 2021-07-12 DIAGNOSIS — E1169 Type 2 diabetes mellitus with other specified complication: Secondary | ICD-10-CM

## 2021-07-12 DIAGNOSIS — G43909 Migraine, unspecified, not intractable, without status migrainosus: Secondary | ICD-10-CM

## 2021-07-12 DIAGNOSIS — F3176 Bipolar disorder, in full remission, most recent episode depressed: Secondary | ICD-10-CM

## 2021-07-12 DIAGNOSIS — Z5181 Encounter for therapeutic drug level monitoring: Secondary | ICD-10-CM

## 2021-07-12 DIAGNOSIS — Z794 Long term (current) use of insulin: Secondary | ICD-10-CM

## 2021-07-12 LAB — POCT GLYCOSYLATED HEMOGLOBIN (HGB A1C): HbA1c, POC (controlled diabetic range): 12 % — AB (ref 0.0–7.0)

## 2021-07-12 LAB — GLUCOSE, POCT (MANUAL RESULT ENTRY): POC Glucose: 289 mg/dl — AB (ref 70–99)

## 2021-07-12 MED ORDER — OMEPRAZOLE 40 MG PO CPDR
40.0000 mg | DELAYED_RELEASE_CAPSULE | Freq: Two times a day (BID) | ORAL | 3 refills | Status: DC
  Filled 2021-07-12: qty 60, 30d supply, fill #0
  Filled 2021-09-16 – 2021-12-02 (×2): qty 60, 30d supply, fill #1

## 2021-07-12 MED ORDER — CLINDAMYCIN PHOS-BENZOYL PEROX 1-5 % EX GEL
Freq: Two times a day (BID) | CUTANEOUS | 4 refills | Status: DC
  Filled 2021-07-12: qty 25, 20d supply, fill #0

## 2021-07-12 MED ORDER — INSULIN GLARGINE 100 UNIT/ML SOLOSTAR PEN
40.0000 [IU] | PEN_INJECTOR | Freq: Every day | SUBCUTANEOUS | 6 refills | Status: DC
  Filled 2021-07-12: qty 12, 30d supply, fill #0

## 2021-07-12 MED ORDER — INSULIN PEN NEEDLE 32G X 4 MM MISC
1.0000 | Freq: Four times a day (QID) | 3 refills | Status: DC
  Filled 2021-07-12: qty 100, 25d supply, fill #0

## 2021-07-12 MED ORDER — VITAMIN D (ERGOCALCIFEROL) 1.25 MG (50000 UNIT) PO CAPS
50000.0000 [IU] | ORAL_CAPSULE | ORAL | 3 refills | Status: DC
  Filled 2021-07-12 – 2021-09-03 (×2): qty 12, 84d supply, fill #0
  Filled 2021-09-16 – 2021-12-02 (×2): qty 4, 28d supply, fill #0

## 2021-07-12 MED ORDER — METHOCARBAMOL 500 MG PO TABS
ORAL_TABLET | ORAL | 3 refills | Status: DC
  Filled 2021-07-12: qty 90, 30d supply, fill #0
  Filled 2021-09-16 – 2021-12-02 (×2): qty 90, 30d supply, fill #1

## 2021-07-12 MED ORDER — INSULIN LISPRO (1 UNIT DIAL) 100 UNIT/ML (KWIKPEN)
PEN_INJECTOR | SUBCUTANEOUS | 6 refills | Status: DC
  Filled 2021-07-12: qty 30, 30d supply, fill #0

## 2021-07-12 MED ORDER — GABAPENTIN 300 MG PO CAPS
600.0000 mg | ORAL_CAPSULE | Freq: Two times a day (BID) | ORAL | 3 refills | Status: DC
  Filled 2021-07-12: qty 120, 30d supply, fill #0

## 2021-07-12 MED ORDER — LEVOTHYROXINE SODIUM 150 MCG PO TABS
150.0000 ug | ORAL_TABLET | Freq: Every day | ORAL | 3 refills | Status: DC
  Filled 2021-07-12: qty 30, 30d supply, fill #0
  Filled 2021-08-27: qty 30, 30d supply, fill #1

## 2021-07-12 MED ORDER — PROPRANOLOL HCL 40 MG PO TABS
40.0000 mg | ORAL_TABLET | Freq: Two times a day (BID) | ORAL | 4 refills | Status: DC
  Filled 2021-07-12: qty 60, 30d supply, fill #0
  Filled 2021-08-27: qty 60, 30d supply, fill #1
  Filled 2021-12-02: qty 60, 30d supply, fill #2

## 2021-07-12 MED ORDER — ATORVASTATIN CALCIUM 10 MG PO TABS
10.0000 mg | ORAL_TABLET | Freq: Every day | ORAL | 2 refills | Status: DC
  Filled 2021-07-12: qty 30, 30d supply, fill #0
  Filled 2021-08-27: qty 30, 30d supply, fill #1

## 2021-07-12 MED ORDER — DAPAGLIFLOZIN PROPANEDIOL 10 MG PO TABS
10.0000 mg | ORAL_TABLET | Freq: Every day | ORAL | 3 refills | Status: DC
Start: 2021-07-12 — End: 2021-09-16
  Filled 2021-07-12: qty 30, 30d supply, fill #0
  Filled 2021-08-27: qty 30, 30d supply, fill #1

## 2021-07-12 NOTE — Assessment & Plan Note (Signed)
Hypertension well controlled continue Inderal

## 2021-07-12 NOTE — Assessment & Plan Note (Signed)
Improved with BenzaClin continue same

## 2021-07-12 NOTE — Assessment & Plan Note (Signed)
Improved with therapy as per psychiatry

## 2021-07-12 NOTE — Assessment & Plan Note (Signed)
Continue vitamin D supplementation 

## 2021-07-12 NOTE — Assessment & Plan Note (Signed)
Recent need for increased dose of Synthroid continue same 150 mcg daily

## 2021-07-12 NOTE — Assessment & Plan Note (Signed)
Poorly controlled type 2 diabetes not taking insulin or Farxiga due to lack of access to medicines  Patient to connect with clinical pharmacy  Refills on all medicines given for the patient she will pick these up today  Foot exam normal  Patient instructed to get an eye exam this calendar year  Patient needs dental care but she does not have insurance this will be a barrier

## 2021-07-12 NOTE — Patient Instructions (Signed)
Refills on all medications sent to our new pharmacy downstairs  Insulin pen needles sent to the pharmacy  Labs today : metabolic panel, lithium level, cholesterol level  Follow healthy diet attached  See Lurena Joiner our clinical pharmacist in three weeks and dr Joya Gaskins in 2 months  Obtain an eye appt with [place] for diabetic eye exam this year [facility]

## 2021-07-13 ENCOUNTER — Other Ambulatory Visit: Payer: Self-pay

## 2021-07-13 LAB — LIPID PANEL
Chol/HDL Ratio: 2.8 ratio (ref 0.0–4.4)
Cholesterol, Total: 131 mg/dL (ref 100–199)
HDL: 47 mg/dL (ref >39)
LDL Chol Calc (NIH): 63 mg/dL (ref 0–99)
Triglycerides: 118 mg/dL (ref 0–149)
VLDL Cholesterol Cal: 21 mg/dL (ref 5–40)

## 2021-07-13 LAB — COMPREHENSIVE METABOLIC PANEL
ALT: 19 IU/L (ref 0–32)
AST: 13 IU/L (ref 0–40)
Albumin/Globulin Ratio: 1.7 (ref 1.2–2.2)
Albumin: 4.7 g/dL (ref 3.8–4.8)
Alkaline Phosphatase: 76 IU/L (ref 44–121)
BUN/Creatinine Ratio: 9 (ref 9–23)
BUN: 11 mg/dL (ref 6–24)
Bilirubin Total: 0.3 mg/dL (ref 0.0–1.2)
CO2: 24 mmol/L (ref 20–29)
Calcium: 10 mg/dL (ref 8.7–10.2)
Chloride: 101 mmol/L (ref 96–106)
Creatinine, Ser: 1.16 mg/dL — ABNORMAL HIGH (ref 0.57–1.00)
Globulin, Total: 2.7 g/dL (ref 1.5–4.5)
Glucose: 268 mg/dL — ABNORMAL HIGH (ref 70–99)
Potassium: 4.7 mmol/L (ref 3.5–5.2)
Sodium: 139 mmol/L (ref 134–144)
Total Protein: 7.4 g/dL (ref 6.0–8.5)
eGFR: 58 mL/min/{1.73_m2} — ABNORMAL LOW (ref >59)

## 2021-07-13 LAB — LITHIUM LEVEL: Lithium Lvl: 0.5 mmol/L (ref 0.5–1.2)

## 2021-07-26 ENCOUNTER — Other Ambulatory Visit: Payer: Self-pay

## 2021-07-29 ENCOUNTER — Ambulatory Visit: Payer: Medicaid Other | Admitting: Critical Care Medicine

## 2021-07-30 ENCOUNTER — Other Ambulatory Visit: Payer: Self-pay

## 2021-08-09 ENCOUNTER — Other Ambulatory Visit: Payer: Self-pay

## 2021-08-10 ENCOUNTER — Other Ambulatory Visit: Payer: Self-pay

## 2021-08-25 ENCOUNTER — Telehealth (HOSPITAL_COMMUNITY): Payer: No Payment, Other | Admitting: Psychiatry

## 2021-08-27 ENCOUNTER — Other Ambulatory Visit: Payer: Self-pay

## 2021-08-31 ENCOUNTER — Other Ambulatory Visit: Payer: Self-pay

## 2021-09-03 ENCOUNTER — Other Ambulatory Visit: Payer: Self-pay

## 2021-09-08 ENCOUNTER — Other Ambulatory Visit: Payer: Self-pay

## 2021-09-10 ENCOUNTER — Other Ambulatory Visit: Payer: Self-pay

## 2021-09-16 ENCOUNTER — Other Ambulatory Visit: Payer: Self-pay

## 2021-09-16 ENCOUNTER — Telehealth: Payer: Self-pay | Admitting: Pharmacy Technician

## 2021-09-16 ENCOUNTER — Encounter: Payer: Self-pay | Admitting: Internal Medicine

## 2021-09-16 ENCOUNTER — Ambulatory Visit (INDEPENDENT_AMBULATORY_CARE_PROVIDER_SITE_OTHER): Payer: BLUE CROSS/BLUE SHIELD | Admitting: Internal Medicine

## 2021-09-16 ENCOUNTER — Other Ambulatory Visit (HOSPITAL_COMMUNITY): Payer: Self-pay

## 2021-09-16 VITALS — BP 128/72 | HR 67 | Ht 70.0 in | Wt 274.0 lb

## 2021-09-16 DIAGNOSIS — Z794 Long term (current) use of insulin: Secondary | ICD-10-CM

## 2021-09-16 DIAGNOSIS — E890 Postprocedural hypothyroidism: Secondary | ICD-10-CM | POA: Diagnosis not present

## 2021-09-16 DIAGNOSIS — R739 Hyperglycemia, unspecified: Secondary | ICD-10-CM

## 2021-09-16 DIAGNOSIS — E1165 Type 2 diabetes mellitus with hyperglycemia: Secondary | ICD-10-CM | POA: Diagnosis not present

## 2021-09-16 LAB — POCT GLYCOSYLATED HEMOGLOBIN (HGB A1C): Hemoglobin A1C: 9.8 % — AB (ref 4.0–5.6)

## 2021-09-16 LAB — TSH: TSH: 4.1 u[IU]/mL (ref 0.35–5.50)

## 2021-09-16 LAB — T4, FREE: Free T4: 1.18 ng/dL (ref 0.60–1.60)

## 2021-09-16 LAB — POCT GLUCOSE (DEVICE FOR HOME USE): Glucose Fasting, POC: 167 mg/dL — AB (ref 70–99)

## 2021-09-16 MED ORDER — INSULIN GLARGINE 100 UNIT/ML SOLOSTAR PEN
30.0000 [IU] | PEN_INJECTOR | Freq: Every day | SUBCUTANEOUS | 3 refills | Status: DC
  Filled 2021-09-16: qty 9, 30d supply, fill #0

## 2021-09-16 MED ORDER — DAPAGLIFLOZIN PROPANEDIOL 10 MG PO TABS
10.0000 mg | ORAL_TABLET | Freq: Every day | ORAL | 3 refills | Status: DC
  Filled 2021-09-16: qty 90, 90d supply, fill #0

## 2021-09-16 MED ORDER — DEXCOM G6 TRANSMITTER MISC
1.0000 | 3 refills | Status: DC
Start: 2021-09-16 — End: 2022-01-28
  Filled 2021-09-16: qty 1, 84d supply, fill #0

## 2021-09-16 MED ORDER — DEXCOM G6 SENSOR MISC
1.0000 | 3 refills | Status: DC
  Filled 2021-09-16: qty 3, 28d supply, fill #0

## 2021-09-16 MED ORDER — BD PEN NEEDLE MICRO U/F 32G X 6 MM MISC
1.0000 | Freq: Four times a day (QID) | 3 refills | Status: DC
  Filled 2021-09-16: qty 100, 25d supply, fill #0

## 2021-09-16 MED ORDER — INSULIN LISPRO (1 UNIT DIAL) 100 UNIT/ML (KWIKPEN)
PEN_INJECTOR | SUBCUTANEOUS | 6 refills | Status: DC
  Filled 2021-09-16: qty 15, 30d supply, fill #0

## 2021-09-16 NOTE — Telephone Encounter (Addendum)
Patient Advocate Encounter ? ?Received notification from [place] that prior authorization for [place] is required. ?  ?PA submitted on 4.6.23 ?TRANSMITTER Key [id number]  ?SENSOR Key [id number]  ?Status is pending ?  ?[facility] will continue to follow ? ?Plato Alspaugh R Markan Cazarez, CPhT ?Patient Advocate ?[facility] ?Phone: [phone] ?Fax:  [phone] ? ?

## 2021-09-16 NOTE — Patient Instructions (Signed)
Restart Lantus at 30  units daily  ?Continue Farxiga 10 mg daily  ?Humalog correctional insulin: Use the scale below to help guide you before each meal  ? ?Blood sugar before meal Number of units to inject  ?Less than 150 0 unit  ?151 -  170 1 units  ?171 -  190 2 units  ?191 -  210 3 units  ?211 -  230 4 units  ?231 -  250 5 units  ?251 -  270 6 units  ?271 -  290 7 units  ?291 -  310 8 units  ? ? ? ? ? ?HOW TO TREAT LOW BLOOD SUGARS (Blood sugar LESS THAN 70 MG/DL) ?Please follow the RULE OF 15 for the treatment of hypoglycemia treatment (when your (blood sugars are less than 70 mg/dL)  ? ?STEP 1: Take 15 grams of carbohydrates when your blood sugar is low, which includes:  ?3-4 GLUCOSE TABS  OR ?3-4 OZ OF JUICE OR REGULAR SODA OR ?ONE TUBE OF GLUCOSE GEL   ? ?STEP 2: RECHECK blood sugar in 15 MINUTES ?STEP 3: If your blood sugar is still low at the 15 minute recheck --> then, go back to STEP 1 and treat AGAIN with another 15 grams of carbohydrates.; ?

## 2021-09-16 NOTE — Telephone Encounter (Signed)
Patient Advocate Encounter ? ?Received notification from [place] that prior authorization for [facility] '10MG'$  is required. ?  ?PA submitted on 4.6.23 ?Key [id number]  ?Status is pending ?  ?[facility] will continue to follow ? ?Elner Seifert R Dimple Bastyr, CPhT ?Patient Advocate ?[facility] ?Phone: [phone] ?Fax:  [phone] ? ?
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
## 2021-09-17 ENCOUNTER — Ambulatory Visit: Payer: Medicaid Other | Admitting: Internal Medicine

## 2021-09-17 ENCOUNTER — Other Ambulatory Visit: Payer: Self-pay

## 2021-09-17 NOTE — Telephone Encounter (Signed)
PRIOR AUTHORIZATION NOT NEEDED AT THIS TIME ? ?

## 2021-09-20 ENCOUNTER — Other Ambulatory Visit: Payer: Self-pay

## 2021-09-20 ENCOUNTER — Telehealth: Payer: Self-pay | Admitting: Critical Care Medicine

## 2021-09-20 ENCOUNTER — Other Ambulatory Visit: Payer: Self-pay | Admitting: Pharmacist

## 2021-09-20 ENCOUNTER — Other Ambulatory Visit (HOSPITAL_COMMUNITY): Payer: Self-pay

## 2021-09-20 MED ORDER — DAPAGLIFLOZIN PROPANEDIOL 10 MG PO TABS
10.0000 mg | ORAL_TABLET | Freq: Every day | ORAL | 3 refills | Status: DC
Start: 2021-09-20 — End: 2021-09-28
  Filled 2021-09-20 – 2021-09-23 (×2): qty 90, 90d supply, fill #0

## 2021-09-20 MED ORDER — LEVOTHYROXINE SODIUM 150 MCG PO TABS
150.0000 ug | ORAL_TABLET | Freq: Every day | ORAL | 3 refills | Status: DC
  Filled 2021-09-20: qty 90, 90d supply, fill #0

## 2021-09-20 NOTE — Telephone Encounter (Signed)
Will resend the [place] over to our pharmacy. I also signed her up for the [place] card. Called the patient and she will go by our pharmacy to see what the new price is and let us know if she's willing to continue this.  ?

## 2021-09-20 NOTE — Telephone Encounter (Signed)
Copied from [place] [id number]. Topic: General - Other ?>> Sep 20, 2021  8:56 AM Leward Quan A wrote: ?Reason for CRM: Patient called in to inquire of Dr Joya Gaskins about getting coupons for dapagliflozin propanediol (FARXIGA) 10 MG TABS tablet Say that she was getting this from the company but now that she have insurance she is asked to pay almost $600 for the medication. Patient asking for some instructions on what to do please call Ph#  [phone] ?

## 2021-09-20 NOTE — Telephone Encounter (Signed)
Patient Advocate Encounter ? ?Prior Authorization for [employer] has been approved.   ? ?PA# [id number] ? ?Effective dates: 09/16/21 through 09/15/22 ? ?Per Test Claim Patients co-pay is $363.10.  ? ?Dexcom G6 Transmitter - $231.57 copay ? ?Spoke with Pharmacy to Process. ? ?Patient Advocate ?Fax: [phone]  ?

## 2021-09-23 ENCOUNTER — Other Ambulatory Visit: Payer: Self-pay

## 2021-09-28 ENCOUNTER — Encounter: Payer: Self-pay | Admitting: Critical Care Medicine

## 2021-09-28 ENCOUNTER — Other Ambulatory Visit: Payer: Self-pay

## 2021-09-28 ENCOUNTER — Telehealth: Payer: Self-pay | Admitting: Critical Care Medicine

## 2021-09-28 ENCOUNTER — Ambulatory Visit: Payer: BLUE CROSS/BLUE SHIELD | Attending: Critical Care Medicine | Admitting: Critical Care Medicine

## 2021-09-28 VITALS — BP 133/82 | HR 67 | Wt 269.8 lb

## 2021-09-28 DIAGNOSIS — F339 Major depressive disorder, recurrent, unspecified: Secondary | ICD-10-CM

## 2021-09-28 DIAGNOSIS — F3162 Bipolar disorder, current episode mixed, moderate: Secondary | ICD-10-CM

## 2021-09-28 DIAGNOSIS — Z794 Long term (current) use of insulin: Secondary | ICD-10-CM

## 2021-09-28 DIAGNOSIS — M545 Low back pain, unspecified: Secondary | ICD-10-CM | POA: Diagnosis not present

## 2021-09-28 DIAGNOSIS — I10 Essential (primary) hypertension: Secondary | ICD-10-CM

## 2021-09-28 DIAGNOSIS — E890 Postprocedural hypothyroidism: Secondary | ICD-10-CM

## 2021-09-28 DIAGNOSIS — E1165 Type 2 diabetes mellitus with hyperglycemia: Secondary | ICD-10-CM

## 2021-09-28 DIAGNOSIS — E1169 Type 2 diabetes mellitus with other specified complication: Secondary | ICD-10-CM

## 2021-09-28 DIAGNOSIS — G8929 Other chronic pain: Secondary | ICD-10-CM

## 2021-09-28 DIAGNOSIS — E785 Hyperlipidemia, unspecified: Secondary | ICD-10-CM

## 2021-09-28 MED ORDER — INSULIN LISPRO (1 UNIT DIAL) 100 UNIT/ML (KWIKPEN): PEN_INJECTOR | SUBCUTANEOUS | 6 refills | Status: DC

## 2021-09-28 MED ORDER — DAPAGLIFLOZIN PROPANEDIOL 10 MG PO TABS: ORAL_TABLET | ORAL | 3 refills | Status: DC

## 2021-09-28 MED ORDER — "INSULIN SYRINGE 27G X 1/2"" 0.5 ML MISC": 2 refills | Status: DC

## 2021-09-28 MED ORDER — INSULIN GLARGINE 100 UNIT/ML SOLOSTAR PEN: PEN_INJECTOR | SUBCUTANEOUS | 3 refills | Status: DC

## 2021-09-28 MED ORDER — INSULIN NPH ISOPHANE & REGULAR (70-30) 100 UNIT/ML ~~LOC~~ SUSP: 15.0000 [IU] | Freq: Two times a day (BID) | SUBCUTANEOUS | 11 refills | Status: DC

## 2021-09-28 NOTE — Telephone Encounter (Signed)
Pt told to hold current rxs d/t cost issues. She will begin OTC 70/30 Relion insulin and see me in May for titration. ?

## 2021-09-28 NOTE — Patient Instructions (Signed)
Hold Farxiga and Lantus and Humalog for now ? ?Begin insulin NPH [employer] brand 70/30 take 15 units twice a day, you will have to use an insulin syringe and drawl up the doses each day you will need to obtain alcohol swabs and you pick up this medication ? ?This is only temporary until we can determine from clinical pharmacy what other options are best for you ? ?No other medication changes ? ?Return to see Lurena Joiner our clinical pharmacist in 2 weeks for face-to-face visit and see Dr. Joya Gaskins in 6 weeks ?

## 2021-09-28 NOTE — Assessment & Plan Note (Signed)
Patient still able to use the thyroid medication she will maintain current dose ?

## 2021-09-28 NOTE — Progress Notes (Signed)
? ?Established Patient Office Visit ? ?Subjective:  ?Patient ID: Lynn Martinez, female    DOB: [birth year]  Age: 51 y.o. MRN: [id number] ? ?CC:  ?No chief complaint on file. ? ? ?HPI ?07/12/21 ?Lynn Martinez presents for diabetes follow-up.  Patient last seen in December.  She has not been using her insulin because she does not have insulin pen needles.  She now has the KwikPen insulin pens but is yet to get the needles.  Patient is taking Lipitor and Inderal however she is not taking her Wilder Glade as well.  A1c today is 13 blood sugar on arrival is elevated at 289 blood pressure is at 135/85.  Patient is a 1C remains elevated because she is not taking her diabetic medications.  She did not apply or get the Dexcom meter she still does not have insurance.  She is using BenzaClin for bromhidrosis and this works well. ? ?Patient will need an eye exam in this calendar year. ? ?Patient did have COVID infection in early January she has little congestion left over from this she took a 5-day course of an oral antiviral for this which worked well. ? ?Patient complains of poor dentition but cannot afford a dentist at this time. ? ?Patient recently was started on new psychiatric medications per behavioral health center she does need a lithium level obtained at this visit. ? ?09/28/2021 ?This patient is seen in return follow-up and unfortunately despite achieving insurance off the insurance exchange with [employer] the co-pays for for G go Lantus and Humalog are too high.  Even with coupon cards her monthly co-pay for Wilder Glade is $300 and Lantus is $200 Humalog is similar amounts.  She does not have the funding for this.  She has had to move in with her mother who lives in [place].  She is out of all of her insulin products and has only 1 4 [name] pill left.  She just saw endocrinology last week and recommend she stay on current medications and referred her back to our clinical pharmacist to  see if we could achieve any of these medications for her.  Unfortunately none of our patient assistance programs will work because she is already insured and it is not legal for [place] to provide free Farxiga Lantus or Humalog to her if she already has an Nurse, mental health. ? ?Patient states she is interested in trying to obtain improved insurance.  Right now financially this is a barrier. ? ?Below is documentation from the last endocrinology visit. ?PATIENT IDENTIFIER: [name] is a 51 y.o. female with a past medical history of T2DM, hypothyroidism, bipolar disorder and HTN . The patient has followed with Endocrinology clinic since 06/01/18 for consultative assistance with management of her diabetes. ?  ?DIABETIC HISTORY:  ?[name] was diagnosed with T2DM ~ 2019, and started insulin therapy shortly after the diagnosis. She has tried Glipizide and Januvia in the past.Intolerant to Metformin .  Her hemoglobin A1c has ranged from 9.1%  in 2017, peaking at 11.1% in 2019. ?  ?Wilder Glade started by PCP 05/2021 ?Pioglitazone stopped 2022-patient not sure what stopped it ?  ?Thyroid History : ?She was diagnosed with Graves' disease in 2004. She was initially treated with thionamides. In 07/2003, she was offered treatment with RAI therapy vs surgical options, pt elected to be treated surgically . ?She underwent total thyroidectomy in 08/15/2003. 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 She was started on LT-4 replacement after surgery and has been compliant with her regimen.  ?  ?  ?  ?Omnipod started 08/19/2020 ? ?1) Type 2 Diabetes Mellitus, Poorly Controlled, Without complications - Most recent A1c of 9.8 %. Goal A1c < 7.0 %.   ?  ?-A1c is trending down but continues to be above goal ?-Patient with multiple social determinants ?-She has not been checking her glucoses not taking her insulin ?-We will restart basal insulin as below,  she will also be started on prandial insulin based on a correction scale, she is not going to be provided with a baseline prandial dose ?  ?  ?  ?MEDICATIONS: ?-Continue Farxiga 10 mg daily ?-Restart Lantus 30 units daily ?-Correction factor : Humalog (BG -130/20) ?  ?  ?Note this patient is trying to follow a healthier diet she has lost some weight she is down to 269 pounds.  Previously has been as much as 300 pounds ?Past Medical History:  ?Diagnosis Date  ? Allergy   ? Anemia   ? Anxiety   ? Arthritis   ? "knees" (09/30/2015)  ? Bipolar disorder ([place])   ? Depression   ? Diabetes mellitus without complication ([place])   ? Fibroid   ? s/p myomectomy 2010, hysterectomy 2013  ? GERD (gastroesophageal reflux disease)   ? Grave's disease   ? Heart murmur   ? Hormone disorder   ? Hyperlipidemia   ? Hypertension   ? Hypothyroidism   ? Memory loss   ? 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 Migraine   ?  otc med prn  ? PCOS (polycystic ovarian syndrome)   ? PMS (premenstrual syndrome)   ? PTSD (post-traumatic stress disorder)   ? Seasonal allergies   ? Thyroid cancer ([place]) 2005  ? PAST HX  ? Thyroid disease   ? Type II diabetes mellitus ([place])   ? Vertigo   ? ? ?Past Surgical History:  ?Procedure Laterality Date  ? ABDOMINAL HYSTERECTOMY  10/11/2011  ? Procedure: HYSTERECTOMY ABDOMINAL;  Surgeon: Terrance Mass, MD;  Location: [facility];  Service: Gynecology;  Laterality: N/A;  With Repair of serosa.  ? ABDOMINAL HYSTERECTOMY    ? CHOLECYSTECTOMY N/A 09/06/2013  ? Procedure: LAPAROSCOPIC CHOLECYSTECTOMY WITH INTRAOPERATIVE CHOLANGIOGRAM;  Surgeon: Merrie Roof, MD;  Location: [facility];  Service: General;  Laterality: N/A;  ? COLONOSCOPY    ? DIAGNOSTIC LAPAROSCOPY    ? DILATION AND CURETTAGE OF UTERUS    ? HERNIA REPAIR    ? LAPAROSCOPIC APPENDECTOMY N/A 04/10/2021  ? Procedure: APPENDECTOMY LAPAROSCOPIC;  Surgeon: Michael Boston, MD;  Location: [facility];  Service: General;  Laterality: N/A;  ? MYOMECTOMY  06/13/2008   ? LAPAROSCOPY  ? thyroid removed    ? TOTAL THYROIDECTOMY  06/13/2004  ? UMBILICAL HERNIA REPAIR  06/13/1980  ? ? ?Family History  ?Problem Relation Age of Onset  ? Thyroid disease Mother   ? Colon polyps Father   ? Diabetes Father   ? Hypertension Father   ? Heart disease Father   ? Heart attack Father   ? Cancer Maternal Aunt   ?     BRAIN  ? Stomach cancer Maternal Grandmother   ? Colon cancer Maternal Grandmother   ? Cancer Maternal Grandmother   ?     LYMPHOMA  ? Hypertension Maternal Grandmother   ? Colon cancer Maternal Grandfather   ? Hypertension Maternal Grandfather   ? Diabetes Maternal  Grandfather   ? Cancer Paternal Grandmother   ?     PANCREATIC  ? Diabetes Paternal Grandmother   ? Hypertension Paternal Grandmother   ? Cancer Paternal Grandfather   ? Hypertension Paternal Grandfather   ? Pancreatic cancer Paternal Grandfather   ? Esophageal cancer Neg Hx   ? Rectal cancer Neg Hx   ? ? ?Social History  ? ?Socioeconomic History  ? Marital status: Single  ?  Spouse name: Not on file  ? Number of children: Not on file  ? Years of education: Not on file  ? Highest education level: 12th grade  ?Occupational History  ? Not on file  ?Tobacco Use  ? Smoking status: Never  ? Smokeless tobacco: Never  ?Vaping Use  ? Vaping Use: Never used  ?Substance and Sexual Activity  ? Alcohol use: Never  ? Drug use: Never  ? Sexual activity: Yes  ?  Birth control/protection: Surgical  ?Other Topics Concern  ? Not on file  ?Social History Narrative  ? ** Merged History Encounter **  ?    ? ?Social Determinants of Health  ? ?Financial Resource Strain: Not on file  ?Food Insecurity: Not on file  ?Transportation Needs: Not on file  ?Physical Activity: Not on file  ?Stress: Not on file  ?Social Connections: Not on file  ?Intimate Partner Violence: Not on file  ? ? ?Outpatient Medications Prior to Visit  ?Medication Sig Dispense Refill  ? atorvastatin (LIPITOR) 10 MG tablet Take 1 tablet (10 mg total) by mouth at bedtime. 60  tablet 2  ? citalopram (CELEXA) 40 MG tablet Take 1 tablet (40 mg total) by mouth daily. 30 tablet 3  ? clindamycin-benzoyl peroxide (BENZACLIN) gel Apply topically 2 (two) times daily. 25 g 4  ? Continuous B

## 2021-09-28 NOTE — Assessment & Plan Note (Signed)
The patient currently is not able to afford Humalog Lantus or Wilder Glade she cannot afford the co-pays even with a coupon cards. ? ?I partnered with our clinical pharmacist and we elected to begin her on lower cost insulin for now which is not ideal but the best we can do given her finances. ? ?Since she has been made and I sent a prescription to the [facility] for the ReliOn NPH 7030 and she is to take 15 units twice daily.  Also sent prescriptions for insulin syringes.  She will have to pull the doses out of the vial on a repeated basis using alcohol. ? ?Patient will return to see clinical pharmacy in 2 weeks and she will stop Farxiga Humalog and Lantus for now ? ?Patient is to continue her lifestyle management as she is currently performing ?

## 2021-09-28 NOTE — Telephone Encounter (Signed)
Sabrina from pharmacy stated prescriptions just sent for pt need to be sent again they are coming in as HOLD with no directions and she needs to delete them. Please resend Rx with directions.  ? ?insulin glargine (LANTUS) 100 UNIT/ML Solostar Pen,insulin lispro (HUMALOG KWIKPEN) 100 UNIT/ML KwikPen ?dapagliflozin propanediol (FARXIGA) 10 MG TABS tablet ?

## 2021-09-28 NOTE — Assessment & Plan Note (Signed)
As per mental health 

## 2021-09-28 NOTE — Assessment & Plan Note (Signed)
Care per mental health ?

## 2021-09-28 NOTE — Assessment & Plan Note (Signed)
Blood pressure at goal no changes made 

## 2021-10-13 ENCOUNTER — Other Ambulatory Visit (HOSPITAL_COMMUNITY): Payer: Self-pay | Admitting: Psychiatry

## 2021-10-13 DIAGNOSIS — F515 Nightmare disorder: Secondary | ICD-10-CM

## 2021-10-22 ENCOUNTER — Ambulatory Visit: Payer: BLUE CROSS/BLUE SHIELD | Admitting: Pharmacist

## 2021-10-27 ENCOUNTER — Telehealth: Payer: Self-pay | Admitting: Critical Care Medicine

## 2021-10-27 NOTE — Telephone Encounter (Signed)
Copied from [place] [phone]. Topic: Referral - Request for Referral ?>> Oct 27, 2021 11:33 AM Erick Blinks wrote: ?Has patient seen PCP for this complaint? Yes.   ?*If NO, is insurance requiring patient see PCP for this issue before PCP can refer them? ?Referral for which specialty: Rehab ?Preferred provider/office: [facility]  ?Reason for referral: Wants to use the pool -caller placed me on hold and did not return to the line for quite some time ?>> Oct 27, 2021  1:40 PM Tessa Lerner A wrote: ?[facility] can be reached via fax at [phone]   ?

## 2021-10-27 NOTE — Telephone Encounter (Signed)
Are we able to send referrals to [facility] ? ?

## 2021-11-18 ENCOUNTER — Other Ambulatory Visit (HOSPITAL_COMMUNITY): Payer: Self-pay | Admitting: Psychiatry

## 2021-11-18 DIAGNOSIS — F3162 Bipolar disorder, current episode mixed, moderate: Secondary | ICD-10-CM

## 2021-11-22 ENCOUNTER — Ambulatory Visit: Payer: BLUE CROSS/BLUE SHIELD | Admitting: Pharmacist

## 2021-11-23 ENCOUNTER — Other Ambulatory Visit: Payer: Self-pay

## 2021-11-23 MED ORDER — DAPAGLIFLOZIN PROPANEDIOL 10 MG PO TABS: 10.0000 mg | ORAL_TABLET | Freq: Every day | ORAL | 1 refills | Status: DC

## 2021-11-23 NOTE — Telephone Encounter (Signed)
Script sent to patient

## 2021-11-24 ENCOUNTER — Telehealth: Payer: Self-pay | Admitting: Critical Care Medicine

## 2021-11-24 NOTE — Telephone Encounter (Signed)
Copied from [place] [phone]. Topic: General - Other >> Nov 24, 2021  2:22 PM Leitha Schuller wrote: Reason for CRM: Al[facility] checking status of refill request faxed 6-12 and 6-13 for levothyroxine (SYNTHROID) 150 MCG tablet  Rep requesting fax sent back to [phone]  Please assist further

## 2021-11-25 MED ORDER — LEVOTHYROXINE SODIUM 150 MCG PO TABS: 150.0000 ug | ORAL_TABLET | Freq: Every day | ORAL | 0 refills | Status: DC

## 2021-11-25 NOTE — Telephone Encounter (Signed)
Levothyroxine rx sent to [facility].

## 2021-11-25 NOTE — Telephone Encounter (Signed)
Rep w/ [facility] checking status of refill request  Informed rep of 72 bh turn around time  Please assist further

## 2021-11-25 NOTE — Telephone Encounter (Signed)
Caller from [facility] following up on previous messages and faxes that were sent over.

## 2021-12-02 ENCOUNTER — Other Ambulatory Visit (HOSPITAL_COMMUNITY): Payer: Self-pay | Admitting: Psychiatry

## 2021-12-02 ENCOUNTER — Other Ambulatory Visit: Payer: Self-pay

## 2021-12-02 DIAGNOSIS — F3162 Bipolar disorder, current episode mixed, moderate: Secondary | ICD-10-CM

## 2021-12-06 ENCOUNTER — Ambulatory Visit: Payer: Commercial Managed Care - HMO | Attending: Critical Care Medicine | Admitting: Critical Care Medicine

## 2021-12-06 ENCOUNTER — Encounter: Payer: Self-pay | Admitting: Critical Care Medicine

## 2021-12-06 VITALS — BP 124/85 | HR 73 | Wt 252.2 lb

## 2021-12-06 DIAGNOSIS — N644 Mastodynia: Secondary | ICD-10-CM

## 2021-12-06 DIAGNOSIS — R0789 Other chest pain: Secondary | ICD-10-CM

## 2021-12-06 DIAGNOSIS — E559 Vitamin D deficiency, unspecified: Secondary | ICD-10-CM

## 2021-12-06 DIAGNOSIS — F3162 Bipolar disorder, current episode mixed, moderate: Secondary | ICD-10-CM

## 2021-12-06 DIAGNOSIS — Z1231 Encounter for screening mammogram for malignant neoplasm of breast: Secondary | ICD-10-CM

## 2021-12-06 DIAGNOSIS — G43909 Migraine, unspecified, not intractable, without status migrainosus: Secondary | ICD-10-CM

## 2021-12-06 DIAGNOSIS — F515 Nightmare disorder: Secondary | ICD-10-CM | POA: Diagnosis not present

## 2021-12-06 DIAGNOSIS — E1165 Type 2 diabetes mellitus with hyperglycemia: Secondary | ICD-10-CM

## 2021-12-06 DIAGNOSIS — E890 Postprocedural hypothyroidism: Secondary | ICD-10-CM

## 2021-12-06 DIAGNOSIS — Z794 Long term (current) use of insulin: Secondary | ICD-10-CM | POA: Diagnosis not present

## 2021-12-06 DIAGNOSIS — M79671 Pain in right foot: Secondary | ICD-10-CM

## 2021-12-06 DIAGNOSIS — M545 Low back pain, unspecified: Secondary | ICD-10-CM

## 2021-12-06 DIAGNOSIS — N6321 Unspecified lump in the left breast, upper outer quadrant: Secondary | ICD-10-CM

## 2021-12-06 DIAGNOSIS — G8929 Other chronic pain: Secondary | ICD-10-CM

## 2021-12-06 DIAGNOSIS — I10 Essential (primary) hypertension: Secondary | ICD-10-CM

## 2021-12-06 DIAGNOSIS — M79672 Pain in left foot: Secondary | ICD-10-CM

## 2021-12-06 LAB — GLUCOSE, POCT (MANUAL RESULT ENTRY): POC Glucose: 135 mg/dl — AB (ref 70–99)

## 2021-12-06 MED ORDER — OMEPRAZOLE 40 MG PO CPDR: 40.0000 mg | DELAYED_RELEASE_CAPSULE | Freq: Two times a day (BID) | ORAL | 3 refills | Status: DC

## 2021-12-06 MED ORDER — PROPRANOLOL HCL 40 MG PO TABS: 40.0000 mg | ORAL_TABLET | Freq: Two times a day (BID) | ORAL | 1 refills | Status: DC

## 2021-12-06 MED ORDER — BD PEN NEEDLE MICRO U/F 32G X 6 MM MISC: 1.0000 | Freq: Four times a day (QID) | 3 refills | Status: DC

## 2021-12-06 MED ORDER — VITAMIN D (ERGOCALCIFEROL) 1.25 MG (50000 UNIT) PO CAPS: 50000.0000 [IU] | ORAL_CAPSULE | ORAL | 3 refills | Status: DC

## 2021-12-06 MED ORDER — GABAPENTIN 300 MG PO CAPS: 600.0000 mg | ORAL_CAPSULE | Freq: Two times a day (BID) | ORAL | 3 refills | Status: DC

## 2021-12-06 MED ORDER — BASAGLAR KWIKPEN 100 UNIT/ML ~~LOC~~ SOPN: 50.0000 [IU] | PEN_INJECTOR | Freq: Every day | SUBCUTANEOUS | 2 refills | Status: DC

## 2021-12-06 MED ORDER — LURASIDONE HCL 120 MG PO TABS: 120.0000 mg | ORAL_TABLET | Freq: Every day | ORAL | 3 refills | Status: DC

## 2021-12-06 MED ORDER — HUMALOG KWIKPEN 200 UNIT/ML ~~LOC~~ SOPN: 10.0000 [IU] | PEN_INJECTOR | Freq: Two times a day (BID) | SUBCUTANEOUS | 3 refills | Status: DC

## 2021-12-06 MED ORDER — PRAZOSIN HCL 1 MG PO CAPS: ORAL_CAPSULE | ORAL | 1 refills | Status: DC

## 2021-12-06 MED ORDER — HYDROXYZINE HCL 25 MG PO TABS: 25.0000 mg | ORAL_TABLET | Freq: Three times a day (TID) | ORAL | 3 refills | Status: DC

## 2021-12-06 MED ORDER — LEVOTHYROXINE SODIUM 150 MCG PO TABS: 150.0000 ug | ORAL_TABLET | Freq: Every day | ORAL | 1 refills | Status: DC

## 2021-12-06 MED ORDER — METHOCARBAMOL 500 MG PO TABS: ORAL_TABLET | ORAL | 3 refills | Status: DC

## 2021-12-06 NOTE — Assessment & Plan Note (Signed)
Continue current dose of Synthroid

## 2021-12-06 NOTE — Assessment & Plan Note (Signed)
Patient now insured we will switch to insulin Basaglar 50 units daily and insulin lispro KwikPen 10 units twice daily

## 2021-12-06 NOTE — Assessment & Plan Note (Signed)
History of recurrent breast mass on the left we will repeat tomographic images of the left breast and ultrasound left axilla

## 2021-12-08 ENCOUNTER — Telehealth: Payer: Self-pay | Admitting: Critical Care Medicine

## 2021-12-08 NOTE — Telephone Encounter (Signed)
Copied from [place] [phone]. Topic: General - Inquiry >> Dec 08, 2021  9:41 AM Marcellus Scott wrote: Reason for CRM: Almyra Free, a case manager with [facility], will be faxing over a treatment plan for pt.   Almyra Free, stated all of her information is on the fax.

## 2021-12-09 NOTE — Telephone Encounter (Signed)
Paperwork was received  will put in providers mailbox

## 2021-12-13 ENCOUNTER — Other Ambulatory Visit: Payer: Self-pay

## 2021-12-13 ENCOUNTER — Other Ambulatory Visit: Payer: Self-pay | Admitting: Critical Care Medicine

## 2021-12-13 DIAGNOSIS — R0789 Other chest pain: Secondary | ICD-10-CM

## 2021-12-13 MED ORDER — OMEPRAZOLE 40 MG PO CPDR
40.0000 mg | DELAYED_RELEASE_CAPSULE | Freq: Every day | ORAL | 3 refills | Status: DC
  Filled 2021-12-13: qty 30, 30d supply, fill #0

## 2021-12-20 ENCOUNTER — Other Ambulatory Visit: Payer: Self-pay

## 2021-12-21 ENCOUNTER — Ambulatory Visit (INDEPENDENT_AMBULATORY_CARE_PROVIDER_SITE_OTHER): Payer: Commercial Managed Care - HMO | Admitting: Podiatry

## 2021-12-21 ENCOUNTER — Ambulatory Visit (INDEPENDENT_AMBULATORY_CARE_PROVIDER_SITE_OTHER): Payer: Commercial Managed Care - HMO

## 2021-12-21 ENCOUNTER — Other Ambulatory Visit: Payer: Self-pay

## 2021-12-21 DIAGNOSIS — M79671 Pain in right foot: Secondary | ICD-10-CM

## 2021-12-21 DIAGNOSIS — M7751 Other enthesopathy of right foot: Secondary | ICD-10-CM

## 2021-12-21 DIAGNOSIS — M79675 Pain in left toe(s): Secondary | ICD-10-CM | POA: Diagnosis not present

## 2021-12-21 DIAGNOSIS — M79674 Pain in right toe(s): Secondary | ICD-10-CM | POA: Diagnosis not present

## 2021-12-21 DIAGNOSIS — B351 Tinea unguium: Secondary | ICD-10-CM

## 2021-12-21 DIAGNOSIS — E1165 Type 2 diabetes mellitus with hyperglycemia: Secondary | ICD-10-CM

## 2021-12-21 DIAGNOSIS — M79672 Pain in left foot: Secondary | ICD-10-CM

## 2021-12-21 DIAGNOSIS — I739 Peripheral vascular disease, unspecified: Secondary | ICD-10-CM | POA: Diagnosis not present

## 2021-12-21 DIAGNOSIS — M722 Plantar fascial fibromatosis: Secondary | ICD-10-CM

## 2021-12-21 DIAGNOSIS — Z794 Long term (current) use of insulin: Secondary | ICD-10-CM

## 2021-12-21 MED ORDER — CLOTRIMAZOLE-BETAMETHASONE 1-0.05 % EX CREA
1.0000 | TOPICAL_CREAM | Freq: Two times a day (BID) | CUTANEOUS | 2 refills | Status: DC
  Filled 2021-12-21 – 2021-12-28 (×2): qty 45, 23d supply, fill #0

## 2021-12-21 MED ORDER — CICLOPIROX 8 % EX SOLN
Freq: Every day | CUTANEOUS | 2 refills | Status: DC
Start: 2021-12-21 — End: 2021-12-22
  Filled 2021-12-21: qty 6.6, 30d supply, fill #0

## 2021-12-22 ENCOUNTER — Telehealth: Payer: Self-pay | Admitting: *Deleted

## 2021-12-22 MED ORDER — CICLOPIROX 8 % EX SOLN: Freq: Every day | CUTANEOUS | 2 refills | Status: DC

## 2021-12-22 NOTE — Telephone Encounter (Signed)
Patient is calling because prescribed medication has been sent to wrong pharmacy. Please advise/ resend

## 2021-12-24 ENCOUNTER — Other Ambulatory Visit: Payer: Self-pay

## 2021-12-24 NOTE — Progress Notes (Signed)
Subjective:   Patient ID: Lynn Martinez, female   DOB: 51 y.o.   MRN: [id number]   HPI 51 year old female presents the office today for diabetic foot care.  She also has multiple concerns.  Her main concern today is that she has no symptoms dry, flaky skin on both of her feet she occasionally gets little pus pockets.  She states that this has been ongoing for many years about 20 years and she states that time she was told it was stress related.  She has secondary concerns of pain on the lateral aspect the ankle started last year as she twisted her ankle.  She thinks that she has arthritis.  No recent treatment for this.  She also gets some discomfort in the ball of her foot she feels numbness as well as pins, needles sensation with walking.  She states massage helps this.  Lastly she states the nails to be trimmed with a thick and long and she is not able to do this herself.   Review of Systems  All other systems reviewed and are negative.  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   ABDOMINAL HYSTERECTOMY     CHOLECYSTECTOMY N/A 09/06/2013   Procedure: LAPAROSCOPIC CHOLECYSTECTOMY WITH INTRAOPERATIVE CHOLANGIOGRAM;  Surgeon: Merrie Roof, MD;  Location: [facility];  Service: General;  Laterality: N/A;   COLONOSCOPY     DIAGNOSTIC LAPAROSCOPY     DILATION AND CURETTAGE OF UTERUS     HERNIA REPAIR     LAPAROSCOPIC APPENDECTOMY N/A 04/10/2021   Procedure: APPENDECTOMY LAPAROSCOPIC;  Surgeon: Michael Boston, MD;  Location: [facility];  Service: General;  Laterality: N/A;   MYOMECTOMY  06/13/2008   LAPAROSCOPY   thyroid removed     TOTAL THYROIDECTOMY  58/02/9832   UMBILICAL HERNIA REPAIR  06/13/1980     Current Outpatient Medications:    clotrimazole-betamethasone (LOTRISONE) cream, Apply 1 Application topically 2 (two) times daily., Disp: 45 g, Rfl: 2   atorvastatin (LIPITOR) 10 MG tablet, Take 1 tablet (10 mg total) by mouth at bedtime., Disp: 60 tablet, Rfl: 2   ciclopirox (PENLAC) 8 % solution, Apply topically at bedtime. Apply over nail and surrounding skin. Apply daily over previous coat. After seven (7) days, may remove with alcohol and continue cycle., Disp: 6.6 mL, Rfl: 2  citalopram (CELEXA) 40 MG tablet, Take 1 tablet (40 mg total) by mouth daily., Disp: 30 tablet, Rfl: 3   clindamycin-benzoyl peroxide (BENZACLIN) gel, Apply topically 2 (two) times daily., Disp: 25 g, Rfl: 4   Continuous Blood Gluc Sensor (DEXCOM G6 SENSOR) MISC, 1 Device by Does not apply route as directed., Disp: 9 each, Rfl: 3   Continuous Blood Gluc Transmit (DEXCOM G6 TRANSMITTER) MISC, 1 Device by Does not apply route as directed., Disp: 1 each, Rfl: 3   dapagliflozin propanediol (FARXIGA) 10 MG TABS tablet, Take 1 tablet (10 mg total) by mouth daily., Disp: 90 tablet, Rfl: 1   gabapentin (NEURONTIN) 300 MG capsule, Take 2 capsules (600 mg total) by mouth 2 (two) times daily., Disp: 120 capsule, Rfl: 3   hydrOXYzine (ATARAX) 25 MG tablet, Take 1  tablet (25 mg total) by mouth 3 (three) times daily., Disp: 90 tablet, Rfl: 3   Insulin Glargine (BASAGLAR KWIKPEN) 100 UNIT/ML, Inject 50 Units into the skin at bedtime., Disp: 15 mL, Rfl: 2   insulin lispro (HUMALOG KWIKPEN) 200 UNIT/ML KwikPen, Inject 10 Units into the skin 2 (two) times daily before lunch and supper., Disp: 6 mL, Rfl: 3   Insulin Pen Needle (BD PEN NEEDLE MICRO U/F) 32G X 6 MM MISC, Use in the morning, at noon, evening, and at bedtime., Disp: 400 each, Rfl: 3   levothyroxine (SYNTHROID) 150 MCG tablet, Take 1 tablet (150 mcg total) by mouth daily., Disp: 90 tablet, Rfl: 1   lithium carbonate (ESKALITH) 450 MG CR tablet, TAKE 2 TABLETS BY MOUTH EVERY DAY AT BEDTIME, Disp: 180 tablet, Rfl: 1   Lurasidone HCl 120 MG TABS, Take 1 tablet (120 mg total) by mouth at bedtime., Disp: 90 tablet, Rfl: 3   methocarbamol (ROBAXIN) 500 MG tablet, TAKE 1 TABLET (500 MG TOTAL) BY MOUTH 3 (THREE) TIMES DAILY AS NEEDED FOR MUSCLE SPASMS., Disp: 90 tablet, Rfl: 3   Multiple Vitamins-Minerals (ONE-A-DAY WOMENS PO), Take 1 tablet by mouth daily with breakfast., Disp: , Rfl:    omeprazole (PRILOSEC) 40 MG capsule, Take 1 capsule (40 mg total) by mouth daily., Disp: 90 capsule, Rfl: 3   prazosin (MINIPRESS) 1 MG capsule, TAKE 1 CAPSULE AT BEDTIME FOR SLEEP/NIGHTMARES, Disp: 90 capsule, Rfl: 1   propranolol (INDERAL) 40 MG tablet, Take 1 tablet (40 mg total) by mouth in the morning and at bedtime., Disp: 180 tablet, Rfl: 1   sennosides-docusate sodium (SENOKOT-S) 8.6-50 MG tablet, Take 3 tablets by mouth daily., Disp: 90 tablet, Rfl: 2   Vitamin D, Ergocalciferol, (DRISDOL) 1.25 MG (50000 UNIT) CAPS capsule, Take 1 capsule (50,000 Units total) by mouth every Saturday., Disp: 12 capsule, Rfl: 3   Wheat Dextrin (BENEFIBER) POWD, Take 1 packet by mouth every other day., Disp: , Rfl:          Objective:  Physical Exam  General: AAO x3, NAD  Dermatological: Nails are hypertrophic, dystrophic,  brittle, discolored, elongated 10. No surrounding redness or drainage. Tenderness nails 1-5 bilaterally.  Dry, peeling skin present plantar aspect the heel.  There is currently no blister formation but she can identify areas that likely were previous small pustules.  There is currently no drainage, erythema, or there is any signs of infection.  No open lesions or pre-ulcerative lesions are identified today.   Vascular: Dorsalis Pedis artery and Posterior Tibial artery pedal pulses are 1/4 bilateral with immedate capillary fill time. There is no pain with calf compression, swelling, warmth, erythema.   Neruologic:  Sensation mildly decreased with Semmes Weinstein monofilament.  Musculoskeletal: Mild has palpation on sinus tarsi with localized edema present.  There is no pain or crepitation with subtalar joint range of motion.  She does get some discomfort along the plantar aspect only marginally with the plantar fascia.  There is no area pinpoint tenderness noted otherwise.  Muscular strength 5/5 in all groups tested bilateral.  Gait: Unassisted, Nonantalgic.       Assessment:   51 year old female with symptomatic onychomycosis, likely tinea pedis, capsulitis/plantar fasciitis, neuropathy     Plan:  -Treatment options discussed including all alternatives, risks, and complications -Etiology of symptoms were discussed -X-rays were obtained and reviewed with the patient.  3 views of the right ankle and 3 views of bilateral feet were obtained.  No evidence of acute fracture. -Sharply debrided nails x10 without any complications or bleeding.  Discussed treatment options for nail fungus.  Prescribed Penlac. -Decrease in the flaky skin on the heels.  Prescribed Lotrisone cream. -Referral to physical therapy -Continue gabapentin. -Daily foot inspection and glucose control.   Trula Slade DPM

## 2021-12-27 ENCOUNTER — Other Ambulatory Visit: Payer: Self-pay

## 2021-12-27 ENCOUNTER — Other Ambulatory Visit: Payer: Commercial Managed Care - HMO

## 2021-12-27 NOTE — Telephone Encounter (Signed)
Called patient , unable to reach, will try later.

## 2021-12-28 ENCOUNTER — Other Ambulatory Visit: Payer: Self-pay

## 2021-12-28 ENCOUNTER — Other Ambulatory Visit (HOSPITAL_COMMUNITY): Payer: Self-pay | Admitting: Psychiatry

## 2021-12-28 DIAGNOSIS — F411 Generalized anxiety disorder: Secondary | ICD-10-CM

## 2021-12-28 DIAGNOSIS — F3162 Bipolar disorder, current episode mixed, moderate: Secondary | ICD-10-CM

## 2021-12-29 ENCOUNTER — Other Ambulatory Visit: Payer: Self-pay

## 2022-01-07 ENCOUNTER — Other Ambulatory Visit: Payer: Self-pay | Admitting: Podiatry

## 2022-01-07 DIAGNOSIS — M722 Plantar fascial fibromatosis: Secondary | ICD-10-CM

## 2022-01-18 ENCOUNTER — Ambulatory Visit (HOSPITAL_COMMUNITY)
Admission: RE | Admit: 2022-01-18 | Discharge: 2022-01-18 | Disposition: A | Discharge status: Home / Self Care | Payer: Commercial Managed Care - HMO | Source: Ambulatory Visit | Attending: Cardiology | Admitting: Cardiology

## 2022-01-18 DIAGNOSIS — I739 Peripheral vascular disease, unspecified: Secondary | ICD-10-CM | POA: Insufficient documentation

## 2022-01-19 ENCOUNTER — Ambulatory Visit
Admission: RE | Admit: 2022-01-19 | Discharge: 2022-01-19 | Disposition: A | Discharge status: Home / Self Care | Payer: Commercial Managed Care - HMO | Source: Ambulatory Visit | Attending: Critical Care Medicine | Admitting: Critical Care Medicine

## 2022-01-19 ENCOUNTER — Other Ambulatory Visit: Payer: Self-pay | Admitting: Critical Care Medicine

## 2022-01-19 DIAGNOSIS — N6321 Unspecified lump in the left breast, upper outer quadrant: Secondary | ICD-10-CM

## 2022-01-19 DIAGNOSIS — N644 Mastodynia: Secondary | ICD-10-CM

## 2022-01-21 NOTE — Progress Notes (Signed)
Called patient, no answer, left message on voicemail that results are normal, to call back if any questions.

## 2022-01-25 ENCOUNTER — Encounter: Payer: Self-pay | Admitting: Podiatry

## 2022-01-28 ENCOUNTER — Ambulatory Visit (INDEPENDENT_AMBULATORY_CARE_PROVIDER_SITE_OTHER): Payer: Commercial Managed Care - HMO | Admitting: Internal Medicine

## 2022-01-28 ENCOUNTER — Encounter: Payer: Self-pay | Admitting: Internal Medicine

## 2022-01-28 VITALS — BP 120/80 | HR 73 | Ht 70.0 in | Wt 252.0 lb

## 2022-01-28 DIAGNOSIS — E890 Postprocedural hypothyroidism: Secondary | ICD-10-CM

## 2022-01-28 DIAGNOSIS — E119 Type 2 diabetes mellitus without complications: Secondary | ICD-10-CM

## 2022-01-28 DIAGNOSIS — Z794 Long term (current) use of insulin: Secondary | ICD-10-CM

## 2022-01-28 LAB — POCT GLYCOSYLATED HEMOGLOBIN (HGB A1C): Hemoglobin A1C: 6.9 % — AB (ref 4.0–5.6)

## 2022-01-28 LAB — POCT GLUCOSE (DEVICE FOR HOME USE): POC Glucose: 84 mg/dl (ref 70–99)

## 2022-01-28 MED ORDER — BASAGLAR KWIKPEN 100 UNIT/ML ~~LOC~~ SOPN: 45.0000 [IU] | PEN_INJECTOR | Freq: Every day | SUBCUTANEOUS | 3 refills | Status: DC

## 2022-01-28 MED ORDER — INSULIN LISPRO (1 UNIT DIAL) 100 UNIT/ML (KWIKPEN)
PEN_INJECTOR | SUBCUTANEOUS | 11 refills | Status: DC
Start: 2022-01-28 — End: 2022-07-12

## 2022-01-28 MED ORDER — DAPAGLIFLOZIN PROPANEDIOL 10 MG PO TABS: 10.0000 mg | ORAL_TABLET | Freq: Every day | ORAL | 3 refills | Status: DC

## 2022-01-28 MED ORDER — LEVOTHYROXINE SODIUM 150 MCG PO TABS
150.0000 ug | ORAL_TABLET | Freq: Every day | ORAL | 3 refills | Status: DC
Start: 2022-01-28 — End: 2022-07-12

## 2022-01-28 MED ORDER — DEXCOM G7 SENSOR MISC: 1.0000 | 3 refills | Status: DC

## 2022-01-28 NOTE — Patient Instructions (Addendum)
Basaglar 45 units daily  Continue Farxiga 10 mg daily  Humalog 10 units with lunch only  Humalog correctional insulin: Use the scale below to help guide you before each meal   Blood sugar before meal Number of units to inject  Less than 150 0 unit  151 -  170 1 units  171 -  190 2 units  191 -  210 3 units  211 -  230 4 units  231 -  250 5 units  251 -  270 6 units  271 -  290 7 units  291 -  310 8 units       HOW TO TREAT LOW BLOOD SUGARS (Blood sugar LESS THAN 70 MG/DL) Please follow the RULE OF 15 for the treatment of hypoglycemia treatment (when your (blood sugars are less than 70 mg/dL)   STEP 1: Take 15 grams of carbohydrates when your blood sugar is low, which includes:  3-4 GLUCOSE TABS  OR 3-4 OZ OF JUICE OR REGULAR SODA OR ONE TUBE OF GLUCOSE GEL    STEP 2: RECHECK blood sugar in 15 MINUTES STEP 3: If your blood sugar is still low at the 15 minute recheck --> then, go back to STEP 1 and treat AGAIN with another 15 grams of carbohydrates.;
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
## 2022-02-08 ENCOUNTER — Ambulatory Visit: Payer: Commercial Managed Care - HMO | Attending: Critical Care Medicine | Admitting: Critical Care Medicine

## 2022-02-08 ENCOUNTER — Encounter: Payer: Self-pay | Admitting: Critical Care Medicine

## 2022-02-08 ENCOUNTER — Other Ambulatory Visit: Payer: Self-pay

## 2022-02-08 VITALS — BP 126/82 | HR 60 | Ht 67.0 in | Wt 243.0 lb

## 2022-02-08 DIAGNOSIS — Z794 Long term (current) use of insulin: Secondary | ICD-10-CM

## 2022-02-08 DIAGNOSIS — Z6838 Body mass index (BMI) 38.0-38.9, adult: Secondary | ICD-10-CM

## 2022-02-08 DIAGNOSIS — M79672 Pain in left foot: Secondary | ICD-10-CM

## 2022-02-08 DIAGNOSIS — F411 Generalized anxiety disorder: Secondary | ICD-10-CM | POA: Diagnosis not present

## 2022-02-08 DIAGNOSIS — R195 Other fecal abnormalities: Secondary | ICD-10-CM

## 2022-02-08 DIAGNOSIS — E785 Hyperlipidemia, unspecified: Secondary | ICD-10-CM

## 2022-02-08 DIAGNOSIS — E1165 Type 2 diabetes mellitus with hyperglycemia: Secondary | ICD-10-CM | POA: Diagnosis not present

## 2022-02-08 DIAGNOSIS — F515 Nightmare disorder: Secondary | ICD-10-CM

## 2022-02-08 DIAGNOSIS — E1169 Type 2 diabetes mellitus with other specified complication: Secondary | ICD-10-CM | POA: Diagnosis not present

## 2022-02-08 DIAGNOSIS — I10 Essential (primary) hypertension: Secondary | ICD-10-CM

## 2022-02-08 DIAGNOSIS — M79671 Pain in right foot: Secondary | ICD-10-CM

## 2022-02-08 DIAGNOSIS — F3162 Bipolar disorder, current episode mixed, moderate: Secondary | ICD-10-CM

## 2022-02-08 DIAGNOSIS — E559 Vitamin D deficiency, unspecified: Secondary | ICD-10-CM

## 2022-02-08 DIAGNOSIS — R0789 Other chest pain: Secondary | ICD-10-CM

## 2022-02-08 DIAGNOSIS — E890 Postprocedural hypothyroidism: Secondary | ICD-10-CM

## 2022-02-08 DIAGNOSIS — Z1211 Encounter for screening for malignant neoplasm of colon: Secondary | ICD-10-CM

## 2022-02-08 MED ORDER — CLINDAMYCIN PHOS-BENZOYL PEROX 1-5 % EX GEL: Freq: Two times a day (BID) | CUTANEOUS | 4 refills | Status: DC

## 2022-02-08 MED ORDER — OMEPRAZOLE 40 MG PO CPDR: 40.0000 mg | DELAYED_RELEASE_CAPSULE | Freq: Every day | ORAL | 3 refills | Status: DC

## 2022-02-08 MED ORDER — ATORVASTATIN CALCIUM 10 MG PO TABS
10.0000 mg | ORAL_TABLET | Freq: Every day | ORAL | 2 refills | Status: DC
Start: 2022-02-08 — End: 2022-07-12

## 2022-02-08 MED ORDER — CITALOPRAM HYDROBROMIDE 40 MG PO TABS: 40.0000 mg | ORAL_TABLET | Freq: Every day | ORAL | 3 refills | Status: DC

## 2022-02-08 MED ORDER — LITHIUM CARBONATE ER 450 MG PO TBCR: EXTENDED_RELEASE_TABLET | ORAL | 1 refills | Status: DC

## 2022-02-08 MED ORDER — CLOTRIMAZOLE-BETAMETHASONE 1-0.05 % EX CREA: 1.0000 | TOPICAL_CREAM | Freq: Two times a day (BID) | CUTANEOUS | 2 refills | Status: DC

## 2022-02-08 NOTE — Patient Instructions (Signed)
No change in medications multiple refills were sent to your express delivery mail order pharmacy  Follow-up with podiatry regarding your toenail fungus  Flu vaccine was given  Colonoscopy referral was made  Return to Dr. Joya Gaskins 5 months  Please get another psychiatry provider scheduled with you  Excellent job on blood pressure and blood glucose your A1c was 6.9 with endocrinology and blood pressure was excellent

## 2022-02-08 NOTE — Assessment & Plan Note (Signed)
Continue with Synthroid 

## 2022-02-08 NOTE — Assessment & Plan Note (Signed)
Asked to find a psychiatrist she has current medications for her mental health

## 2022-02-08 NOTE — Assessment & Plan Note (Signed)
Continue with weekly vitamin D

## 2022-02-08 NOTE — Assessment & Plan Note (Signed)
Continue with gabapentin and follow-up with podiatry

## 2022-02-08 NOTE — Assessment & Plan Note (Signed)
Blood pressure well controlled continue current medication program

## 2022-02-08 NOTE — Assessment & Plan Note (Signed)
A1c at goal no change in medicines continue with follow-up with endocrinology

## 2022-02-08 NOTE — Assessment & Plan Note (Signed)
Nightmares improved but having insomnia told patient to try melatonin 10 mg an hour before she goes to sleep and stay on propranolol and Minipress

## 2022-02-08 NOTE — Assessment & Plan Note (Signed)
Referral for colonoscopy has been made

## 2022-02-10 ENCOUNTER — Telehealth: Payer: Self-pay | Admitting: Gastroenterology

## 2022-02-10 ENCOUNTER — Telehealth: Payer: Self-pay | Admitting: Critical Care Medicine

## 2022-02-10 NOTE — Telephone Encounter (Signed)
Copied from [place] [id number]. Topic: General - Other >> Feb 08, 2022 10:16 AM Cyndi Bender wrote: Reason for CRM: Almyra Free with [facility] requests office note for today's (02/08/22) visit to be faxed to fax# [phone]

## 2022-02-10 NOTE — Telephone Encounter (Signed)
Patient returned call, and was scheduled for an OV with Dr. Tarri Glenn on 10/17 at 11:10.

## 2022-02-10 NOTE — Telephone Encounter (Signed)
We received a referral for this patient to have a colonoscopy.  Dr. Tarri Glenn has this patient on recall for 12/2027; however, Dr. Asencion Noble told patient because she was diabetic that she needed to have one every year.  Please call patient and advise.  Thank you.  Please call her at [phone].  Thank you.

## 2022-02-10 NOTE — Telephone Encounter (Signed)
Left message for patient to call back  

## 2022-02-16 NOTE — Telephone Encounter (Signed)
Called patient twice unable to make contact or leave voicemail. Wanted to make sure she is aware of the most recent request before I fax it

## 2022-02-22 ENCOUNTER — Ambulatory Visit (INDEPENDENT_AMBULATORY_CARE_PROVIDER_SITE_OTHER): Payer: Commercial Managed Care - HMO | Admitting: Podiatry

## 2022-02-22 DIAGNOSIS — M722 Plantar fascial fibromatosis: Secondary | ICD-10-CM | POA: Diagnosis not present

## 2022-02-22 MED ORDER — KETOCONAZOLE 2 % EX CREA: 1.0000 | TOPICAL_CREAM | Freq: Every day | CUTANEOUS | 0 refills | Status: DC

## 2022-02-22 NOTE — Patient Instructions (Addendum)
For inserts I like POWERSTEPS, SUPERFEET, AETREX  WEARING INSTRUCTIONS FOR ORTHOTICS  Don't expect to be comfortable wearing your orthotic devices for the first time.  Like eyeglasses, you may be aware of them as time passes, they will not be uncomfortable and you will enjoy wearing them.  FOLLOW THESE INSTRUCTIONS EXACTLY!  Wear your orthotic devices for:       Not more than 1 hour the first day.       Not more than 2 hours the second day.       Not more than 3 hours the third day and so on.        Or wear them for as long as they feel comfortable.       If you experience discomfort in your feet or legs take them out.  When feet & legs feel       better, put them back in.  You do need to be consistent and wear them a little        everyday. 2.   If at any time the orthotic devices become acutely uncomfortable before the       time for that particular day, STOP WEARING THEM. 3.   On the next day, do not increase the wearing time. 4.   Subsequently, increase the wearing time by 15-30 minutes only if comfortable to do       so. 5.   You will be seen by your doctor about 2-4 weeks after you receive your orthotic       devices, at which time you will probably be wearing your devices comfortably        for about 8 hours or more a day. 6.   Some patients occasionally report mild aches or discomfort in other parts of the of       body such as the knees, hips or back after 3 or 4 consecutive hours of wear.  If this       is the case with you, do not extend your wearing time.  Instead, cut it back an hour or       two.  In all likelihood, these symptoms will disappear in a short period of time as your       body posture realigns itself and functions more efficiently. 7.   It is possible that your orthotic device may require some small changes or adjustment       to improve their function or make them more comfortable.   This is usually not done       before one to three months have elapsed.   These adjustments are made in        accordance with the changed position your feet are assuming as a result of       improved biomechanical function. 8.   In women's shoes, it's not unusual for your heel to slip out of the shoe, particularly if       they are step-in-shoes.  If this is the case, try other shoes or other styles.  Try to       purchase shoes which have deeper heal seats or higher heel counters. 9.   Squeaking of orthotics devices in the shoes is due to the movement of the devices       when they are functioning normally.  To eliminate squeaking, simply dust some       baby powder into your shoes before inserting the devices.  If this  does not work,        apply soap or wax to the edges of the orthotic devices or put a tissue into the shoes. 10. It is important that you follow these directions explicitly.  Failure to do so will simply       prolong the adjustment period or create problems which are easily avoided.  It makes       no difference if you are wearing your orthotic devices for only a few hours after        several months, so long as you are wearing them comfortably for those hours. 11. If you have any questions or complaints, contact our office.  We have no way of       knowing about your problems unless you tell us.  If we do not hear from you, we will       assume that you are proceeding well.

## 2022-02-22 NOTE — Progress Notes (Signed)
Subjective:   Patient ID: Lynn Martinez, female   DOB: 51 y.o.   MRN: [id number]   HPI Chief Complaint  Patient presents with   Foot Pain    Bilateral foot pain right hurts more than the left foot, patient is doing the same as the last visit, she has burning and pins and needles aon the bottom of the foot right foot sharp stabbing pain on the lateral side of the foot, rate of pain 8 out of 10, TX: Gabapentin, hasn't started P.T.  A1c-6.9 BG-95(yersterday)    51 year old female presents the office today for diabetic foot care.  She states that she has not heard from physical therapy.  She still getting arch pain and heel as well.  She also describes pins and needle sensations.  She still on gabapentin.  Also for the bumps that she had in the foot the Lotrisone cream is helping some but not completely resolving the issue.  No open lesions reports no areas of drainage.    Review of Systems  All other systems reviewed and are negative.      Objective:  Physical Exam  General: AAO x3, NAD  Dermatological: Nails are hypertrophic, dystrophic, brittle, discolored, elongated 10. No surrounding redness or drainage. Tenderness nails 1-5 bilaterally.  Dry, peeling skin present plantar aspect the heel.  No blister formation today what appears to be likely tinea, on the heel from old blisters but not currently any drainage or discharge formation.  There is no open lesions bilaterally.   Vascular: Dorsalis Pedis artery and Posterior Tibial artery pedal pulses are 1/4 bilateral with immedate capillary fill time. There is no pain with calf compression, swelling, warmth, erythema.   Neruologic: Sensation mildly decreased with Semmes Weinstein monofilament.  Musculoskeletal: No significant tenderness today upon palpation on sinus tarsi however there is localized edema present.  There is no pain or crepitation with subtalar joint range of motion.  She does get some discomfort along the  plantar aspect only marginally with the plantar fascia/arch of the foot.  There is no area pinpoint tenderness noted otherwise.  Muscular strength 5/5 in all groups tested bilateral.  Gait: Unassisted, Nonantalgic.       Assessment:   51 year old female with symptomatic onychomycosis, likely tinea pedis, plantar fasciitis, neuropathy     Plan:  -Treatment options discussed including all alternatives, risks, and complications -Etiology of symptoms were discussed -Updated referral to breakthrough physical therapy ordered.  In particular she like to do aquatic therapy. We discussed shoe modifications and good arch support. We discussed different types of inserts and she is going to start with OTC. Discussed shoe changes.  -Continue gabapentin for neuropathy -Ordered ketoconazole cream -Sharply debrided nails x 10 without complications or bleeding.  -Daily foot inspection and glucose control.   Trula Slade DPM

## 2022-03-29 ENCOUNTER — Ambulatory Visit: Payer: Commercial Managed Care - HMO | Admitting: Gastroenterology

## 2022-05-18 ENCOUNTER — Ambulatory Visit: Payer: Commercial Managed Care - HMO | Admitting: Gastroenterology

## 2022-05-23 ENCOUNTER — Telehealth: Payer: Self-pay

## 2022-05-23 NOTE — Telephone Encounter (Signed)
Called patient unable to make contact or leave vm.  Called to schedule patient for a retinal screening clinic 11/28

## 2022-05-24 ENCOUNTER — Ambulatory Visit: Payer: Commercial Managed Care - HMO | Admitting: Podiatry

## 2022-06-21 ENCOUNTER — Ambulatory Visit: Payer: Commercial Managed Care - HMO | Admitting: Podiatry

## 2022-06-22 ENCOUNTER — Other Ambulatory Visit: Payer: Self-pay | Admitting: Critical Care Medicine

## 2022-06-22 DIAGNOSIS — G43909 Migraine, unspecified, not intractable, without status migrainosus: Secondary | ICD-10-CM

## 2022-06-22 MED ORDER — PROPRANOLOL HCL 40 MG PO TABS: 40.0000 mg | ORAL_TABLET | Freq: Two times a day (BID) | ORAL | 0 refills | Status: DC

## 2022-06-22 NOTE — Telephone Encounter (Unsigned)
Copied from [place] [id number]. Topic: General - Other >> Jun 22, 2022  3:00 PM Everette C wrote: Reason for CRM: Medication Refill - Medication: propranolol (INDERAL) 40 MG tablet [[id number]]  Vitamin D, Ergocalciferol, (DRISDOL) 1.25 MG (50000 UNIT) CAPS capsule [[id number]]  Has the patient contacted their pharmacy? Yes.   (Agent: If no, request that the patient contact the pharmacy for the refill. If patient does not wish to contact the pharmacy document the reason why and proceed with request.) (Agent: If yes, when and what did the pharmacy advise?)  Preferred Pharmacy (with phone number or street name): [id number] [place], [address] - [address] [place] [id number]: [phone]: [phone]: Not open 24 hours   Has the patient been seen for an appointment in the last year OR does the patient have an upcoming appointment? Yes.    Agent: Please be advised that RX refills may take up to 3 business days. We ask that you follow-up with your pharmacy.

## 2022-06-22 NOTE — Telephone Encounter (Signed)
Requested medication (s) are due for refill today: routing for review  Requested medication (s) are on the active medication list: yes  Last refill:  12/06/21  Future visit scheduled: yes  Notes to clinic:  Manual Review: Route requests for 50,000 IU strength to the provider      Requested Prescriptions  Pending Prescriptions Disp Refills   Vitamin D, Ergocalciferol, (DRISDOL) 1.25 MG (50000 UNIT) CAPS capsule 12 capsule 3    Sig: Take 1 capsule (50,000 Units total) by mouth every Saturday.     Endocrinology:  Vitamins - Vitamin D Supplementation 2 Failed - 06/22/2022  3:39 PM      Failed - Manual Review: Route requests for 50,000 IU strength to the provider      Failed - Vitamin D in normal range and within 360 days    Vit D, 25-Hydroxy  Date Value Ref Range Status  03/18/2020 19.4 (L) 30.0 - 100.0 ng/mL Final    Comment:    Vitamin D deficiency has been defined by the [place] practice guideline as a level of serum 25-OH vitamin D less than 20 ng/mL (1,2). The Endocrine Society went on to further define vitamin D insufficiency as a level between 21 and 29 ng/mL (2). 1. IOM (Institute of Medicine). 2010. Dietary reference    intakes for calcium and D. [place]: The    [employer]. 2. Holick MF, Binkley [place], Bischoff-Ferrari HA, et al.    Evaluation, treatment, and prevention of vitamin D    deficiency: an Endocrine Society clinical practice    guideline. JCEM. 2011 Jul; 96(7):1911-30.          Passed - Ca in normal range and within 360 days    Calcium  Date Value Ref Range Status  07/12/2021 10.0 8.7 - 10.2 mg/dL Final   Calcium, Ion  Date Value Ref Range Status  05/24/2011 1.15 1.12 - 1.32 mmol/L Final         Passed - Valid encounter within last 12 months    Recent Outpatient Visits           4 months ago Type 2 diabetes mellitus with hyperglycemia, with long-term current use of insulin ([place])   [place] Lynn Stain, MD   6 months ago Type 2 diabetes mellitus with hyperglycemia, with long-term current use of insulin [facility])   [place] Lynn Stain, MD   8 months ago Type 2 diabetes mellitus with hyperglycemia, with long-term current use of insulin [facility])   [place] Lynn Stain, MD   11 months ago Type 2 diabetes mellitus with hyperglycemia, with long-term current use of insulin [facility])   [place] Lynn Stain, MD   1 year ago Type 2 diabetes mellitus with hyperglycemia, with long-term current use of insulin [facility])   [place], MD       Future Appointments             In 2 weeks Lynn Stain, MD [place]            Signed Prescriptions Disp Refills   propranolol (INDERAL) 40 MG tablet 180 tablet 0    Sig: Take 1 tablet (40 mg total) by mouth in the morning and at bedtime.     Cardiovascular:  Beta Blockers  Passed - 06/22/2022  3:39 PM      Passed - Last BP in normal range    BP Readings from Last 1 Encounters:  02/08/22 126/82         Passed - Last Heart Rate in normal range    Pulse Readings from Last 1 Encounters:  02/08/22 60         Passed - Valid encounter within last 6 months    Recent Outpatient Visits           4 months ago Type 2 diabetes mellitus with hyperglycemia, with long-term current use of insulin ([place])   [place] Lynn Stain, MD   6 months ago Type 2 diabetes mellitus with hyperglycemia, with long-term current use of insulin [facility])   [place] Lynn Stain, MD   8 months ago Type 2 diabetes mellitus with hyperglycemia, with long-term current use of insulin [facility])   [place] Lynn Stain, MD   11 months ago Type 2  diabetes mellitus with hyperglycemia, with long-term current use of insulin [facility])   [place], Patrick E, MD   1 year ago Type 2 diabetes mellitus with hyperglycemia, with long-term current use of insulin [facility])   [place], MD       Future Appointments             In 2 weeks Lynn Stain, MD [place]

## 2022-06-22 NOTE — Telephone Encounter (Signed)
Requested Prescriptions  Pending Prescriptions Disp Refills   Vitamin D, Ergocalciferol, (DRISDOL) 1.25 MG (50000 UNIT) CAPS capsule 12 capsule 3    Sig: Take 1 capsule (50,000 Units total) by mouth every Saturday.     Endocrinology:  Vitamins - Vitamin D Supplementation 2 Failed - 06/22/2022  3:39 PM      Failed - Manual Review: Route requests for 50,000 IU strength to the provider      Failed - Vitamin D in normal range and within 360 days    Vit D, 25-Hydroxy  Date Value Ref Range Status  03/18/2020 19.4 (L) 30.0 - 100.0 ng/mL Final    Comment:    Vitamin D deficiency has been defined by the [place] practice guideline as a level of serum 25-OH vitamin D less than 20 ng/mL (1,2). The Endocrine Society went on to further define vitamin D insufficiency as a level between 21 and 29 ng/mL (2). 1. IOM (Institute of Medicine). 2010. Dietary reference    intakes for calcium and D. [place]: The    [employer]. 2. Holick MF, Binkley [place], Bischoff-Ferrari HA, et al.    Evaluation, treatment, and prevention of vitamin D    deficiency: an Endocrine Society clinical practice    guideline. JCEM. 2011 Jul; 96(7):1911-30.          Passed - Ca in normal range and within 360 days    Calcium  Date Value Ref Range Status  07/12/2021 10.0 8.7 - 10.2 mg/dL Final   Calcium, Ion  Date Value Ref Range Status  05/24/2011 1.15 1.12 - 1.32 mmol/L Final         Passed - Valid encounter within last 12 months    Recent Outpatient Visits           4 months ago Type 2 diabetes mellitus with hyperglycemia, with long-term current use of insulin ([place])   [place] Elsie Stain, MD   6 months ago Type 2 diabetes mellitus with hyperglycemia, with long-term current use of insulin [facility])   [place] Elsie Stain, MD   8 months ago Type 2 diabetes mellitus with hyperglycemia, with  long-term current use of insulin [facility])   [place] Elsie Stain, MD   11 months ago Type 2 diabetes mellitus with hyperglycemia, with long-term current use of insulin [facility])   [place] Elsie Stain, MD   1 year ago Type 2 diabetes mellitus with hyperglycemia, with long-term current use of insulin [facility])   [place], MD       Future Appointments             In 2 weeks Elsie Stain, MD [place]             propranolol (INDERAL) 40 MG tablet 180 tablet 0    Sig: Take 1 tablet (40 mg total) by mouth in the morning and at bedtime.     Cardiovascular:  Beta Blockers Passed - 06/22/2022  3:39 PM      Passed - Last BP in normal range    BP Readings from Last 1 Encounters:  02/08/22 126/82         Passed - Last Heart Rate in normal range    Pulse Readings from Last 1 Encounters:  02/08/22 60  Passed - Valid encounter within last 6 months    Recent Outpatient Visits           4 months ago Type 2 diabetes mellitus with hyperglycemia, with long-term current use of insulin [facility])   [place] Elsie Stain, MD   6 months ago Type 2 diabetes mellitus with hyperglycemia, with long-term current use of insulin [facility])   [place] Elsie Stain, MD   8 months ago Type 2 diabetes mellitus with hyperglycemia, with long-term current use of insulin [facility])   [place] Elsie Stain, MD   11 months ago Type 2 diabetes mellitus with hyperglycemia, with long-term current use of insulin [facility])   [place], Patrick E, MD   1 year ago Type 2 diabetes mellitus with hyperglycemia, with long-term current use of insulin [facility])   [place], MD        Future Appointments             In 2 weeks Elsie Stain, MD [place]

## 2022-06-23 ENCOUNTER — Other Ambulatory Visit (HOSPITAL_COMMUNITY): Payer: Self-pay | Admitting: Psychiatry

## 2022-06-23 DIAGNOSIS — F515 Nightmare disorder: Secondary | ICD-10-CM

## 2022-06-23 DIAGNOSIS — F3162 Bipolar disorder, current episode mixed, moderate: Secondary | ICD-10-CM

## 2022-06-23 DIAGNOSIS — F411 Generalized anxiety disorder: Secondary | ICD-10-CM

## 2022-06-23 MED ORDER — VITAMIN D (ERGOCALCIFEROL) 1.25 MG (50000 UNIT) PO CAPS: 50000.0000 [IU] | ORAL_CAPSULE | ORAL | 0 refills | Status: DC

## 2022-07-01 ENCOUNTER — Other Ambulatory Visit: Payer: Self-pay | Admitting: Critical Care Medicine

## 2022-07-01 DIAGNOSIS — F3162 Bipolar disorder, current episode mixed, moderate: Secondary | ICD-10-CM

## 2022-07-01 DIAGNOSIS — F411 Generalized anxiety disorder: Secondary | ICD-10-CM

## 2022-07-01 MED ORDER — CITALOPRAM HYDROBROMIDE 40 MG PO TABS: 40.0000 mg | ORAL_TABLET | Freq: Every day | ORAL | 0 refills | Status: DC

## 2022-07-01 MED ORDER — HYDROXYZINE HCL 25 MG PO TABS: 25.0000 mg | ORAL_TABLET | Freq: Three times a day (TID) | ORAL | 0 refills | Status: DC

## 2022-07-01 NOTE — Telephone Encounter (Signed)
Requested medication (s) are due for refill today: Yes  Requested medication (s) are on the active medication list: Yes  Last refill:  12/06/21  Future visit scheduled: Yes  Notes to clinic:  Unable to refill per protocol, cannot delegate.      Requested Prescriptions  Pending Prescriptions Disp Refills   Lurasidone HCl 120 MG TABS 90 tablet 3    Sig: Take 1 tablet (120 mg total) by mouth at bedtime.     Not Delegated - Psychiatry:  Antipsychotics - Second Generation (Atypical) - lurasidone Failed - 07/01/2022  4:15 PM      Failed - This refill cannot be delegated      Failed - Lipid Panel in normal range within the last 12 months    Cholesterol, Total  Date Value Ref Range Status  07/12/2021 131 100 - 199 mg/dL Final   LDL Chol Calc (NIH)  Date Value Ref Range Status  07/12/2021 63 0 - 99 mg/dL Final   HDL  Date Value Ref Range Status  07/12/2021 47 >39 mg/dL Final   Triglycerides  Date Value Ref Range Status  07/12/2021 118 0 - 149 mg/dL Final         Failed - CBC within normal limits and completed in the last 12 months    WBC  Date Value Ref Range Status  05/12/2021 9.6 4.0 - 10.5 K/uL Final   RBC  Date Value Ref Range Status  05/12/2021 4.72 3.87 - 5.11 MIL/uL Final   Hemoglobin  Date Value Ref Range Status  05/12/2021 12.7 12.0 - 15.0 g/dL Final  03/18/2020 13.0 11.1 - 15.9 g/dL Final   HCT  Date Value Ref Range Status  05/12/2021 40.1 36.0 - 46.0 % Final   Hematocrit  Date Value Ref Range Status  03/18/2020 39.8 34.0 - 46.6 % Final   MCHC  Date Value Ref Range Status  05/12/2021 31.7 30.0 - 36.0 g/dL Final   [facility]  Date Value Ref Range Status  05/12/2021 26.9 26.0 - 34.0 pg Final   MCV  Date Value Ref Range Status  05/12/2021 85.0 80.0 - 100.0 fL Final  03/18/2020 83 79 - 97 fL Final   No results found for: "PLTCOUNTKUC", "LABPLAT", "POCPLA" RDW  Date Value Ref Range Status  05/12/2021 13.9 11.5 - 15.5 % Final  03/18/2020 13.8 11.7 -  15.4 % Final         Passed - TSH in normal range and within 360 days    TSH  Date Value Ref Range Status  09/16/2021 4.10 0.35 - 5.50 uIU/mL Final         Passed - Completed PHQ-2 or PHQ-9 in the last 360 days      Passed - Last BP in normal range    BP Readings from Last 1 Encounters:  02/08/22 126/82         Passed - Last Heart Rate in normal range    Pulse Readings from Last 1 Encounters:  02/08/22 60         Passed - Valid encounter within last 6 months    Recent Outpatient Visits           4 months ago Type 2 diabetes mellitus with hyperglycemia, with long-term current use of insulin [facility])   [place] Elsie Stain, MD   6 months ago Type 2 diabetes mellitus with hyperglycemia, with long-term current use of insulin [facility])   [place] Elsie Stain, MD   9 months ago Type 2 diabetes mellitus with hyperglycemia, with long-term current use of insulin [facility])   [place] Elsie Stain, MD   11 months ago Type 2 diabetes mellitus with hyperglycemia, with long-term current use of insulin [facility])   [place] Elsie Stain, MD   1 year ago Type 2 diabetes mellitus with hyperglycemia, with long-term current use of insulin [facility])   [place] Elsie Stain, MD       Future Appointments             In 1 week Elsie Stain, MD [place] within normal limits and completed in the last 12 months    Albumin  Date Value Ref Range Status  07/12/2021 4.7 3.8 - 4.8 g/dL Final   Alkaline Phosphatase  Date Value Ref Range Status  07/12/2021 76 44 - 121 IU/L Final   ALT  Date Value Ref Range Status  07/12/2021 19 0 - 32 IU/L Final   AST  Date Value Ref Range Status  07/12/2021 13 0 - 40 IU/L Final   BUN  Date  Value Ref Range Status  07/12/2021 11 6 - 24 mg/dL Final   Calcium  Date Value Ref Range Status  07/12/2021 10.0 8.7 - 10.2 mg/dL Final   Calcium, Ion  Date Value Ref Range Status  05/24/2011 1.15 1.12 - 1.32 mmol/L Final   CO2  Date Value Ref Range Status  07/12/2021 24 20 - 29 mmol/L Final   TCO2  Date Value Ref Range Status  05/24/2011 26 0 - 100 mmol/L Final   Creatinine, Ser  Date Value Ref Range Status  07/12/2021 1.16 (H) 0.57 - 1.00 mg/dL Final   Creatinine,U  Date Value Ref Range Status  05/20/2021 81.0 mg/dL Final   Glucose  Date Value Ref Range Status  07/12/2021 268 (H) 70 - 99 mg/dL Final   Glucose, Bld  Date Value Ref Range Status  05/12/2021 333 (H) 70 - 99 mg/dL Final    Comment:    Glucose reference range applies only to samples taken after fasting for at least 8 hours.   POC Glucose  Date Value Ref Range Status  01/28/2022 84 70 - 99 mg/dl Final   Glucose Fasting, POC  Date Value Ref Range Status  09/16/2021 167 (A) 70 - 99 mg/dL Final   Glucose-Capillary  Date Value Ref Range Status  04/11/2021 177 (H) 70 - 99 mg/dL Final    Comment:    Glucose reference range applies only to samples taken after fasting for at least 8 hours.   Potassium  Date Value Ref Range Status  07/12/2021 4.7 3.5 - 5.2 mmol/L Final   Sodium  Date Value Ref Range Status  07/12/2021 139 134 - 144 mmol/L Final   Bilirubin Total  Date Value Ref Range Status  07/12/2021 0.3 0.0 - 1.2 mg/dL Final   Bilirubin, Direct  Date Value Ref Range Status  09/05/2013 0.5 (H) 0.0 - 0.3 mg/dL Final    Comment:    NO VISIBLE HEMOLYSIS   Indirect Bilirubin  Date Value Ref Range Status  09/05/2013 0.4 0.3 - 0.9 mg/dL Final   Protein, ur  Date Value Ref Range Status  05/12/2021 NEGATIVE NEGATIVE mg/dL Final  Total Protein  Date Value Ref Range Status  07/12/2021 7.4 6.0 - 8.5 g/dL Final   GFR calc Af Amer  Date Value Ref Range Status  07/09/2020 108 >59 mL/min/1.73  Final    Comment:    **In accordance with recommendations from the NKF-ASN Task force,**   [facility] is in the process of updating its eGFR calculation to the   2021 CKD-EPI creatinine equation that estimates kidney function   without a race variable.    GFR  Date Value Ref Range Status  08/13/2010 95.20 >60.00 mL/min Final   eGFR  Date Value Ref Range Status  07/12/2021 58 (L) >59 mL/min/1.73 Final   GFR, Estimated  Date Value Ref Range Status  05/12/2021 >60 >60 mL/min Final    Comment:    (NOTE) Calculated using the CKD-EPI Creatinine Equation (2021)          Signed Prescriptions Disp Refills   citalopram (CELEXA) 40 MG tablet 30 tablet 0    Sig: Take 1 tablet (40 mg total) by mouth daily. OFFICE VISIT NEEDED FOR ADDITIONAL REFILLS     Psychiatry:  Antidepressants - SSRI Passed - 07/01/2022  4:15 PM      Passed - Completed PHQ-2 or PHQ-9 in the last 360 days      Passed - Valid encounter within last 6 months    Recent Outpatient Visits           4 months ago Type 2 diabetes mellitus with hyperglycemia, with long-term current use of insulin [facility])   [place] Elsie Stain, MD   6 months ago Type 2 diabetes mellitus with hyperglycemia, with long-term current use of insulin [facility])   [place] Elsie Stain, MD   9 months ago Type 2 diabetes mellitus with hyperglycemia, with long-term current use of insulin [facility])   [place] Elsie Stain, MD   11 months ago Type 2 diabetes mellitus with hyperglycemia, with long-term current use of insulin [facility])   [place] Elsie Stain, MD   1 year ago Type 2 diabetes mellitus with hyperglycemia, with long-term current use of insulin [facility])   [place] Elsie Stain, MD       Future Appointments             In 1 week Elsie Stain, MD [place]             hydrOXYzine (ATARAX) 25 MG tablet 90 tablet 0    Sig: Take 1 tablet (25 mg total) by mouth 3 (three) times daily.     Ear, Nose, and Throat:  Antihistamines 2 Failed - 07/01/2022  4:15 PM      Failed - Cr in normal range and within 360 days    Creatinine, Ser  Date Value Ref Range Status  07/12/2021 1.16 (H) 0.57 - 1.00 mg/dL Final   Creatinine,U  Date Value Ref Range Status  05/20/2021 81.0 mg/dL Final         Passed - Valid encounter within last 12 months    Recent Outpatient Visits           4 months ago Type 2 diabetes mellitus with hyperglycemia, with long-term current use of insulin [facility])   [place] Elsie Stain, MD   6  months ago Type 2 diabetes mellitus with hyperglycemia, with long-term current use of insulin [facility])   [place] Elsie Stain, MD   9 months ago Type 2 diabetes mellitus with hyperglycemia, with long-term current use of insulin [facility])   [place] Elsie Stain, MD   11 months ago Type 2 diabetes mellitus with hyperglycemia, with long-term current use of insulin [facility])   [place] Elsie Stain, MD   1 year ago Type 2 diabetes mellitus with hyperglycemia, with long-term current use of insulin [facility])   [place] Elsie Stain, MD       Future Appointments             In 1 week Elsie Stain, MD [place]

## 2022-07-01 NOTE — Telephone Encounter (Signed)
Requested Prescriptions  Pending Prescriptions Disp Refills   citalopram (CELEXA) 40 MG tablet 30 tablet 0    Sig: Take 1 tablet (40 mg total) by mouth daily. OFFICE VISIT NEEDED FOR ADDITIONAL REFILLS     Psychiatry:  Antidepressants - SSRI Passed - 07/01/2022  4:15 PM      Passed - Completed PHQ-2 or PHQ-9 in the last 360 days      Passed - Valid encounter within last 6 months    Recent Outpatient Visits           4 months ago Type 2 diabetes mellitus with hyperglycemia, with long-term current use of insulin [facility])   [place] Lynn Martinez, [name], with long-term current use of insulin [facility])   [place] Lynn Martinez, [name], with long-term current use of insulin [facility])   [place] Lynn Martinez, [name], with long-term current use of insulin [facility])   [place] Lynn Martinez, [name], with long-term current use of insulin [facility])   [place] Lynn Martinez, Lynn Martinez       Future Appointments             In 1 week Lynn Martinez, Lynn Martinez [place]             hydrOXYzine (ATARAX) 25 MG tablet 90 tablet 0    Sig: Take 1 tablet (25 mg total) by mouth 3 (three) times daily.     Ear, Nose, and Throat:  Antihistamines 2 Failed - 07/01/2022  4:15 PM      Failed - Cr in normal range and within 360 days    Creatinine, Ser  Date Value Ref Range Status  07/12/2021 1.16 (H) 0.57 - 1.00 mg/dL Final   Creatinine,U  Date Value Ref Range Status  05/20/2021 81.0 mg/dL Final         Passed - Valid encounter within last 12 months    Recent  Outpatient Visits           4 months ago Type 2 diabetes mellitus with hyperglycemia, with long-term current use of insulin [facility])   [place] Lynn Martinez, [name], with long-term current use of insulin [facility])   [place] Lynn Martinez, [name], with long-term current use of insulin [facility])   [place] Lynn Martinez, [name], with long-term current use of insulin [facility])   [place] Lynn Martinez, [name], with long-term current use of insulin [facility])   [place] Lynn Martinez, Lynn Martinez       Future Appointments             In 1 week Lynn Martinez, [name]             Lurasidone HCl 120 MG TABS 90 tablet 3    Sig: Take 1 tablet (120 mg total) by mouth at bedtime.     Not Delegated - Psychiatry:  Antipsychotics - Second Generation (Atypical) - lurasidone Failed - 07/01/2022  4:15 PM      Failed - This refill cannot be delegated      Failed - Lipid Panel in normal range within the last 12 months    Cholesterol, Total  Date Value Ref Range Status  07/12/2021 131 100 - 199 mg/dL Final   LDL Chol Calc (NIH)  Date Value Ref Range Status  07/12/2021 63 0 - 99 mg/dL Final   HDL  Date Value Ref Range Status  07/12/2021 47 >39 mg/dL Final   Triglycerides  Date Value Ref Range Status  07/12/2021 118 0 - 149 mg/dL Final         Failed - CBC within normal limits and completed in the last 12 months    WBC  Date Value Ref Range Status  05/12/2021 9.6 4.0 - 10.5 K/uL Final   RBC  Date Value Ref Range Status  05/12/2021 4.72 3.87  - 5.11 MIL/uL Final   Hemoglobin  Date Value Ref Range Status  05/12/2021 12.7 12.0 - 15.0 g/dL Final  03/18/2020 13.0 11.1 - 15.9 g/dL Final   HCT  Date Value Ref Range Status  05/12/2021 40.1 36.0 - 46.0 % Final   Hematocrit  Date Value Ref Range Status  03/18/2020 39.8 34.0 - 46.6 % Final   MCHC  Date Value Ref Range Status  05/12/2021 31.7 30.0 - 36.0 g/dL Final   [facility]  Date Value Ref Range Status  05/12/2021 26.9 26.0 - 34.0 pg Final   MCV  Date Value Ref Range Status  05/12/2021 85.0 80.0 - 100.0 fL Final  03/18/2020 83 79 - 97 fL Final   No results found for: "PLTCOUNTKUC", "LABPLAT", "POCPLA" RDW  Date Value Ref Range Status  05/12/2021 13.9 11.5 - 15.5 % Final  03/18/2020 13.8 11.7 - 15.4 % Final         Passed - TSH in normal range and within 360 days    TSH  Date Value Ref Range Status  09/16/2021 4.10 0.35 - 5.50 uIU/mL Final         Passed - Completed PHQ-2 or PHQ-9 in the last 360 days      Passed - Last BP in normal range    BP Readings from Last 1 Encounters:  02/08/22 126/82         Passed - Last Heart Rate in normal range    Pulse Readings from Last 1 Encounters:  02/08/22 60         Passed - Valid encounter within last 6 months    Recent Outpatient Visits           4 months ago Type 2 diabetes mellitus with hyperglycemia, with long-term current use of insulin [facility])   [place] Lynn Martinez, [name], with long-term current use of insulin [facility])   [place] Lynn Martinez, [name], with long-term current use of insulin [facility])   [place] Lynn Martinez, Lynn Martinez  11 months ago Type 2 diabetes mellitus with hyperglycemia, with long-term current use of insulin ([place])   [place]  Lynn Martinez, [name], with long-term current use of insulin [facility])   [place] Lynn Martinez, Lynn Martinez       Future Appointments             In 1 week Lynn Martinez, Lynn Martinez [place] within normal limits and completed in the last 12 months    Albumin  Date Value Ref Range Status  07/12/2021 4.7 3.8 - 4.8 g/dL Final   Alkaline Phosphatase  Date Value Ref Range Status  07/12/2021 76 44 - 121 IU/L Final   ALT  Date Value Ref Range Status  07/12/2021 19 0 - 32 IU/L Final   AST  Date Value Ref Range Status  07/12/2021 13 0 - 40 IU/L Final   BUN  Date Value Ref Range Status  07/12/2021 11 6 - 24 mg/dL Final   Calcium  Date Value Ref Range Status  07/12/2021 10.0 8.7 - 10.2 mg/dL Final   Calcium, Ion  Date Value Ref Range Status  05/24/2011 1.15 1.12 - 1.32 mmol/L Final   CO2  Date Value Ref Range Status  07/12/2021 24 20 - 29 mmol/L Final   TCO2  Date Value Ref Range Status  05/24/2011 26 0 - 100 mmol/L Final   Creatinine, Ser  Date Value Ref Range Status  07/12/2021 1.16 (H) 0.57 - 1.00 mg/dL Final   Creatinine,U  Date Value Ref Range Status  05/20/2021 81.0 mg/dL Final   Glucose  Date Value Ref Range Status  07/12/2021 268 (H) 70 - 99 mg/dL Final   Glucose, Bld  Date Value Ref Range Status  05/12/2021 333 (H) 70 - 99 mg/dL Final    Comment:    Glucose reference range applies only to samples taken after fasting for at least 8 hours.   POC Glucose  Date Value Ref Range Status  01/28/2022 84 70 - 99 mg/dl Final   Glucose Fasting, POC  Date Value Ref Range Status  09/16/2021 167 (A) 70 - 99 mg/dL Final   Glucose-Capillary  Date Value Ref Range Status  04/11/2021 177 (H) 70 - 99 mg/dL Final    Comment:    Glucose reference range applies only to samples taken after fasting for at least 8 hours.    Potassium  Date Value Ref Range Status  07/12/2021 4.7 3.5 - 5.2 mmol/L Final   Sodium  Date Value Ref Range Status  07/12/2021 139 134 - 144 mmol/L Final   Bilirubin Total  Date Value Ref Range Status  07/12/2021 0.3 0.0 - 1.2 mg/dL Final   Bilirubin, Direct  Date Value Ref Range Status  09/05/2013 0.5 (H) 0.0 - 0.3 mg/dL Final    Comment:    NO VISIBLE HEMOLYSIS   Indirect Bilirubin  Date Value Ref Range Status  09/05/2013 0.4 0.3 - 0.9 mg/dL Final   Protein, ur  Date Value Ref Range Status  05/12/2021 NEGATIVE NEGATIVE mg/dL Final   Total Protein  Date Value Ref Range Status  07/12/2021 7.4 6.0 - 8.5 g/dL Final   GFR calc Af Amer  Date Value Ref Range Status  07/09/2020 108 >59 mL/min/1.73 Final  Comment:    **In accordance with recommendations from the NKF-ASN Task force,**   [facility] is in the process of updating its eGFR calculation to the   2021 CKD-EPI creatinine equation that estimates kidney function   without a race variable.    GFR  Date Value Ref Range Status  08/13/2010 95.20 >60.00 mL/min Final   eGFR  Date Value Ref Range Status  07/12/2021 58 (L) >59 mL/min/1.73 Final   GFR, Estimated  Date Value Ref Range Status  05/12/2021 >60 >60 mL/min Final    Comment:    (NOTE) Calculated using the CKD-EPI Creatinine Equation (2021)

## 2022-07-01 NOTE — Telephone Encounter (Signed)
Medication Refill - Medication: citalopram (CELEXA) 40 MG tablet hydrOXYzine (ATARAX) 25 MG tablet/latuda she thinks is '120mg'$  Patient is out of these meds and is asking for short supply until refill. Anette Guarneri is the most important  Has the patient contacted their pharmacy? yes (Agent: If no, request that the patient contact the pharmacy for the refill. If patient does not wish to contact the pharmacy document the reason why and proceed with request.) (Agent: If yes, when and what did the pharmacy advise?)contact pcp  Preferred Pharmacy (with phone number or street name):  [id number] [place], [place] - [address] Phone: [phone] Fax: [phone]    Has the patient been seen for an appointment in the last year OR does the patient have an upcoming appointment? yes  Agent: Please be advised that RX refills may take up to 3 business days. We ask that you follow-up with your pharmacy.
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
## 2022-07-12 ENCOUNTER — Encounter: Payer: Self-pay | Admitting: Critical Care Medicine

## 2022-07-12 ENCOUNTER — Ambulatory Visit: Payer: Medicaid Other | Attending: Critical Care Medicine | Admitting: Critical Care Medicine

## 2022-07-12 VITALS — BP 111/75 | HR 58 | Temp 98.4°F | Wt 248.6 lb

## 2022-07-12 DIAGNOSIS — M545 Low back pain, unspecified: Secondary | ICD-10-CM

## 2022-07-12 DIAGNOSIS — E785 Hyperlipidemia, unspecified: Secondary | ICD-10-CM

## 2022-07-12 DIAGNOSIS — E7849 Other hyperlipidemia: Secondary | ICD-10-CM | POA: Insufficient documentation

## 2022-07-12 DIAGNOSIS — Z79899 Other long term (current) drug therapy: Secondary | ICD-10-CM | POA: Diagnosis not present

## 2022-07-12 DIAGNOSIS — M5440 Lumbago with sciatica, unspecified side: Secondary | ICD-10-CM | POA: Diagnosis not present

## 2022-07-12 DIAGNOSIS — Z5181 Encounter for therapeutic drug level monitoring: Secondary | ICD-10-CM | POA: Diagnosis not present

## 2022-07-12 DIAGNOSIS — F515 Nightmare disorder: Secondary | ICD-10-CM | POA: Diagnosis not present

## 2022-07-12 DIAGNOSIS — Z803 Family history of malignant neoplasm of breast: Secondary | ICD-10-CM | POA: Diagnosis not present

## 2022-07-12 DIAGNOSIS — I10 Essential (primary) hypertension: Secondary | ICD-10-CM | POA: Insufficient documentation

## 2022-07-12 DIAGNOSIS — R3589 Other polyuria: Secondary | ICD-10-CM | POA: Insufficient documentation

## 2022-07-12 DIAGNOSIS — G43909 Migraine, unspecified, not intractable, without status migrainosus: Secondary | ICD-10-CM

## 2022-07-12 DIAGNOSIS — R450 Nervousness: Secondary | ICD-10-CM | POA: Diagnosis not present

## 2022-07-12 DIAGNOSIS — E559 Vitamin D deficiency, unspecified: Secondary | ICD-10-CM | POA: Diagnosis not present

## 2022-07-12 DIAGNOSIS — F603 Borderline personality disorder: Secondary | ICD-10-CM | POA: Diagnosis not present

## 2022-07-12 DIAGNOSIS — E1165 Type 2 diabetes mellitus with hyperglycemia: Secondary | ICD-10-CM

## 2022-07-12 DIAGNOSIS — Z760 Encounter for issue of repeat prescription: Secondary | ICD-10-CM | POA: Diagnosis not present

## 2022-07-12 DIAGNOSIS — Z79890 Hormone replacement therapy (postmenopausal): Secondary | ICD-10-CM | POA: Diagnosis not present

## 2022-07-12 DIAGNOSIS — Z7984 Long term (current) use of oral hypoglycemic drugs: Secondary | ICD-10-CM | POA: Diagnosis not present

## 2022-07-12 DIAGNOSIS — Z6841 Body Mass Index (BMI) 40.0 and over, adult: Secondary | ICD-10-CM | POA: Insufficient documentation

## 2022-07-12 DIAGNOSIS — R0789 Other chest pain: Secondary | ICD-10-CM | POA: Diagnosis not present

## 2022-07-12 DIAGNOSIS — G8929 Other chronic pain: Secondary | ICD-10-CM

## 2022-07-12 DIAGNOSIS — Z794 Long term (current) use of insulin: Secondary | ICD-10-CM | POA: Diagnosis not present

## 2022-07-12 DIAGNOSIS — I739 Peripheral vascular disease, unspecified: Secondary | ICD-10-CM | POA: Insufficient documentation

## 2022-07-12 DIAGNOSIS — F3132 Bipolar disorder, current episode depressed, moderate: Secondary | ICD-10-CM

## 2022-07-12 DIAGNOSIS — F3162 Bipolar disorder, current episode mixed, moderate: Secondary | ICD-10-CM | POA: Diagnosis not present

## 2022-07-12 DIAGNOSIS — F411 Generalized anxiety disorder: Secondary | ICD-10-CM

## 2022-07-12 DIAGNOSIS — N3281 Overactive bladder: Secondary | ICD-10-CM | POA: Diagnosis not present

## 2022-07-12 DIAGNOSIS — G43809 Other migraine, not intractable, without status migrainosus: Secondary | ICD-10-CM | POA: Insufficient documentation

## 2022-07-12 DIAGNOSIS — E1169 Type 2 diabetes mellitus with other specified complication: Secondary | ICD-10-CM | POA: Insufficient documentation

## 2022-07-12 DIAGNOSIS — R195 Other fecal abnormalities: Secondary | ICD-10-CM

## 2022-07-12 LAB — POCT GLYCOSYLATED HEMOGLOBIN (HGB A1C): Hemoglobin A1C: 8 % — AB (ref 4.0–5.6)

## 2022-07-12 LAB — GLUCOSE, POCT (MANUAL RESULT ENTRY): POC Glucose: 166 mg/dl — AB (ref 70–99)

## 2022-07-12 MED ORDER — BD PEN NEEDLE MICRO U/F 32G X 6 MM MISC: 1.0000 | Freq: Four times a day (QID) | 3 refills | Status: DC

## 2022-07-12 MED ORDER — PROPRANOLOL HCL 40 MG PO TABS: 40.0000 mg | ORAL_TABLET | Freq: Two times a day (BID) | ORAL | 1 refills | Status: DC

## 2022-07-12 MED ORDER — CITALOPRAM HYDROBROMIDE 40 MG PO TABS: 40.0000 mg | ORAL_TABLET | Freq: Every day | ORAL | 0 refills | Status: DC

## 2022-07-12 MED ORDER — BASAGLAR KWIKPEN 100 UNIT/ML ~~LOC~~ SOPN: 45.0000 [IU] | PEN_INJECTOR | Freq: Every day | SUBCUTANEOUS | 3 refills | Status: DC

## 2022-07-12 MED ORDER — LEVOTHYROXINE SODIUM 150 MCG PO TABS: 150.0000 ug | ORAL_TABLET | Freq: Every day | ORAL | 3 refills | Status: DC

## 2022-07-12 MED ORDER — PRAZOSIN HCL 1 MG PO CAPS: ORAL_CAPSULE | ORAL | 1 refills | Status: DC

## 2022-07-12 MED ORDER — OMEPRAZOLE 40 MG PO CPDR: 40.0000 mg | DELAYED_RELEASE_CAPSULE | Freq: Every day | ORAL | 3 refills | Status: DC

## 2022-07-12 MED ORDER — LITHIUM CARBONATE ER 450 MG PO TBCR: EXTENDED_RELEASE_TABLET | ORAL | 1 refills | Status: DC

## 2022-07-12 MED ORDER — DAPAGLIFLOZIN PROPANEDIOL 10 MG PO TABS: 10.0000 mg | ORAL_TABLET | Freq: Every day | ORAL | 3 refills | Status: DC

## 2022-07-12 MED ORDER — CLINDAMYCIN PHOS-BENZOYL PEROX 1-5 % EX GEL: Freq: Two times a day (BID) | CUTANEOUS | 4 refills | Status: DC

## 2022-07-12 MED ORDER — ATORVASTATIN CALCIUM 10 MG PO TABS: 10.0000 mg | ORAL_TABLET | Freq: Every day | ORAL | 2 refills | Status: DC

## 2022-07-12 MED ORDER — GABAPENTIN 300 MG PO CAPS: 600.0000 mg | ORAL_CAPSULE | Freq: Two times a day (BID) | ORAL | 3 refills | Status: DC

## 2022-07-12 MED ORDER — VITAMIN D (ERGOCALCIFEROL) 1.25 MG (50000 UNIT) PO CAPS: 50000.0000 [IU] | ORAL_CAPSULE | ORAL | 2 refills | Status: DC

## 2022-07-12 MED ORDER — METHOCARBAMOL 500 MG PO TABS: ORAL_TABLET | ORAL | 3 refills | Status: DC

## 2022-07-12 MED ORDER — LURASIDONE HCL 120 MG PO TABS: 120.0000 mg | ORAL_TABLET | Freq: Every day | ORAL | 3 refills | Status: DC

## 2022-07-12 MED ORDER — HYDROXYZINE HCL 25 MG PO TABS: 25.0000 mg | ORAL_TABLET | Freq: Three times a day (TID) | ORAL | 0 refills | Status: DC

## 2022-07-12 MED ORDER — INSULIN LISPRO (1 UNIT DIAL) 100 UNIT/ML (KWIKPEN): PEN_INJECTOR | SUBCUTANEOUS | 11 refills | Status: DC

## 2022-07-12 NOTE — Assessment & Plan Note (Signed)
Continue with vitamin-D supplement.

## 2022-07-12 NOTE — Assessment & Plan Note (Addendum)
Continue with Minipress

## 2022-07-12 NOTE — Assessment & Plan Note (Signed)
Refilled all mental health medications encouraged to keep upcoming mental health appointment

## 2022-07-12 NOTE — Patient Instructions (Signed)
Referral back to gastroenterology is made  Complete screening labs obtained at this visit  Refills on all medications [facility]  Please keep your follow-up appointment with your new mental health doctor and also your February appointment with your endocrinology diabetic doctor  Continue to follow a lifestyle medicine approach see handout we gave you  Return to Dr. Joya Gaskins 5 months

## 2022-07-12 NOTE — Assessment & Plan Note (Signed)
Continue statins and reassess lipid panel

## 2022-07-12 NOTE — Assessment & Plan Note (Signed)
Check urinalysis. 

## 2022-07-12 NOTE — Assessment & Plan Note (Signed)
Hypertension currently controlled observe off medication for now

## 2022-07-12 NOTE — Assessment & Plan Note (Signed)
Continue with Wilder Glade and insulin glargine and insulin lispro and make sure patient has follow-up with endocrinology The following Lifestyle Medicine recommendations according to [facility]) were discussed and offered to patient who agrees to start the journey:  A. Whole Foods, Plant-based plate comprising of fruits and vegetables, plant-based proteins, whole-grain carbohydrates was discussed in detail with the patient.   A list for source of those nutrients were also provided to the patient.  Patient will use only water or unsweetened tea for hydration. B.  The need to stay away from risky substances including alcohol, smoking; obtaining 7 to 9 hours of restorative sleep, at least 150 minutes of moderate intensity exercise weekly, the importance of healthy social connections,  and stress reduction techniques were discussed. C.  A full color page of  Calorie density of various food groups per pound showing examples of each food groups was provided to the patient.

## 2022-07-14 LAB — COMPREHENSIVE METABOLIC PANEL
ALT: 12 IU/L (ref 0–32)
AST: 13 IU/L (ref 0–40)
Albumin/Globulin Ratio: 1.5 (ref 1.2–2.2)
Albumin: 4.4 g/dL (ref 3.9–4.9)
Alkaline Phosphatase: 68 IU/L (ref 44–121)
BUN/Creatinine Ratio: 14 (ref 9–23)
BUN: 13 mg/dL (ref 6–24)
Bilirubin Total: <0.2 mg/dL (ref 0.0–1.2)
CO2: 22 mmol/L (ref 20–29)
Calcium: 10.2 mg/dL (ref 8.7–10.2)
Chloride: 99 mmol/L (ref 96–106)
Creatinine, Ser: 0.9 mg/dL (ref 0.57–1.00)
Globulin, Total: 2.9 g/dL (ref 1.5–4.5)
Glucose: 147 mg/dL — ABNORMAL HIGH (ref 70–99)
Potassium: 4.5 mmol/L (ref 3.5–5.2)
Sodium: 137 mmol/L (ref 134–144)
Total Protein: 7.3 g/dL (ref 6.0–8.5)
eGFR: 78 mL/min/{1.73_m2} (ref >59)

## 2022-07-14 LAB — URINALYSIS
Bilirubin, UA: NEGATIVE
Ketones, UA: NEGATIVE
Leukocytes,UA: NEGATIVE
Nitrite, UA: NEGATIVE
Protein,UA: NEGATIVE
RBC, UA: NEGATIVE
Specific Gravity, UA: 1.024 (ref 1.005–1.030)
Urobilinogen, Ur: 0.2 mg/dL (ref 0.2–1.0)
pH, UA: 6.5 (ref 5.0–7.5)

## 2022-07-14 LAB — LITHIUM LEVEL: Lithium Lvl: 0.6 mmol/L (ref 0.5–1.2)

## 2022-07-14 LAB — MICROALBUMIN / CREATININE URINE RATIO
Creatinine, Urine: 87.8 mg/dL
Microalb/Creat Ratio: 5 mg/g creat (ref 0–29)
Microalbumin, Urine: 4.6 ug/mL

## 2022-07-14 NOTE — Progress Notes (Signed)
Let pt know lithium level normal, , urine normal no kidney damage, liver normal

## 2022-07-18 ENCOUNTER — Telehealth: Payer: Self-pay

## 2022-07-18 NOTE — Telephone Encounter (Signed)
-----   Message from Elsie Stain, MD sent at 07/14/2022  7:22 PM EST ----- Let pt know lithium level normal, , urine normal no kidney damage, liver normal

## 2022-07-18 NOTE — Telephone Encounter (Signed)
Pt was called and is aware of results, DOB was confirmed.  ?

## 2022-07-21 ENCOUNTER — Telehealth: Payer: Self-pay | Admitting: Critical Care Medicine

## 2022-07-21 NOTE — Telephone Encounter (Signed)
Copied from [place] [id number]. Topic: General - Other >> Jul 21, 2022  1:25 PM Everette C wrote: Reason for CRM: Terra with [employer] has called regarding a form needing corrections, the document was related to the patient's incontinence supplies   The form NCMA for Prior Approval  Questions 19 and 20 need to be answered fully  Please contact further when possible

## 2022-07-25 ENCOUNTER — Other Ambulatory Visit: Payer: Self-pay | Admitting: Critical Care Medicine

## 2022-07-25 DIAGNOSIS — F3162 Bipolar disorder, current episode mixed, moderate: Secondary | ICD-10-CM

## 2022-07-25 DIAGNOSIS — F411 Generalized anxiety disorder: Secondary | ICD-10-CM

## 2022-07-26 ENCOUNTER — Ambulatory Visit: Payer: Medicaid Other | Admitting: Podiatry

## 2022-07-26 NOTE — Telephone Encounter (Signed)
Requested Prescriptions  Pending Prescriptions Disp Refills   citalopram (CELEXA) 40 MG tablet [Pharmacy Med Name: CITALOPRAM HBR 40 MG TABLET] 90 tablet 1    Sig: TAKE 1 TABLET (40 MG TOTAL) BY MOUTH DAILY. OFFICE VISIT NEEDED FOR ADDITIONAL REFILLS     Psychiatry:  Antidepressants - SSRI Passed - 07/25/2022  2:30 PM      Passed - Completed PHQ-2 or PHQ-9 in the last 360 days      Passed - Valid encounter within last 6 months    Recent Outpatient Visits           2 weeks ago Type 2 diabetes mellitus with hyperglycemia, with long-term current use of insulin [facility])   [place] Elsie Stain, MD   5 months ago Type 2 diabetes mellitus with hyperglycemia, with long-term current use of insulin [facility])   [place] Elsie Stain, MD   7 months ago Type 2 diabetes mellitus with hyperglycemia, with long-term current use of insulin [facility])   [place] Elsie Stain, MD   10 months ago Type 2 diabetes mellitus with hyperglycemia, with long-term current use of insulin [facility])   [place] Elsie Stain, MD   1 year ago Type 2 diabetes mellitus with hyperglycemia, with long-term current use of insulin [facility])   [place] Elsie Stain, MD       Future Appointments             In 4 months [name], MD [place]

## 2022-07-29 NOTE — Telephone Encounter (Signed)
Placed in providers box for correction

## 2022-08-02 ENCOUNTER — Ambulatory Visit: Payer: Commercial Managed Care - HMO | Admitting: Internal Medicine
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
## 2022-08-04 ENCOUNTER — Telehealth: Payer: Self-pay

## 2022-08-04 NOTE — Telephone Encounter (Signed)
I called [employer]: [phone] to verify what is needed to complete CMN for incontinence products. I left a message requesting a call back.  I received a call from Jennifer/[employer] she transferred me to another extension and I had to leave a voicemail requesting a call back.

## 2022-08-06 ENCOUNTER — Other Ambulatory Visit: Payer: Self-pay | Admitting: Internal Medicine

## 2022-08-06 ENCOUNTER — Other Ambulatory Visit: Payer: Self-pay | Admitting: Critical Care Medicine

## 2022-08-06 DIAGNOSIS — F3162 Bipolar disorder, current episode mixed, moderate: Secondary | ICD-10-CM

## 2022-08-08 NOTE — Telephone Encounter (Signed)
Requested Prescriptions  Pending Prescriptions Disp Refills   hydrOXYzine (ATARAX) 25 MG tablet [Pharmacy Med Name: HYDROXYZINE HCL 25 MG TABLET] 270 tablet 1    Sig: TAKE 1 TABLET BY MOUTH THREE TIMES A DAY     Ear, Nose, and Throat:  Antihistamines 2 Passed - 08/06/2022  4:10 PM      Passed - Cr in normal range and within 360 days    Creatinine, Ser  Date Value Ref Range Status  07/12/2022 0.90 0.57 - 1.00 mg/dL Final   Creatinine,U  Date Value Ref Range Status  05/20/2021 81.0 mg/dL Final         Passed - Valid encounter within last 12 months    Recent Outpatient Visits           3 weeks ago Type 2 diabetes mellitus with hyperglycemia, with long-term current use of insulin ([place])   [place] Elsie Stain, MD   6 months ago Type 2 diabetes mellitus with hyperglycemia, with long-term current use of insulin [facility])   [place] Elsie Stain, MD   8 months ago Type 2 diabetes mellitus with hyperglycemia, with long-term current use of insulin [facility])   [place] Elsie Stain, MD   10 months ago Type 2 diabetes mellitus with hyperglycemia, with long-term current use of insulin [facility])   [place] Elsie Stain, MD   1 year ago Type 2 diabetes mellitus with hyperglycemia, with long-term current use of insulin [facility])   [place] Elsie Stain, MD       Future Appointments             In 4 months [name], MD [place]

## 2022-08-10 ENCOUNTER — Other Ambulatory Visit: Payer: Self-pay

## 2022-08-10 MED ORDER — DAPAGLIFLOZIN PROPANEDIOL 10 MG PO TABS: 10.0000 mg | ORAL_TABLET | Freq: Every day | ORAL | 5 refills | Status: DC

## 2022-08-23 ENCOUNTER — Ambulatory Visit: Payer: Medicaid Other | Admitting: Podiatry

## 2022-09-06 ENCOUNTER — Ambulatory Visit: Payer: Medicaid Other | Admitting: Podiatry

## 2022-09-09 ENCOUNTER — Other Ambulatory Visit: Payer: Self-pay | Admitting: Internal Medicine

## 2022-09-21 ENCOUNTER — Telehealth: Payer: Self-pay

## 2022-09-21 NOTE — Telephone Encounter (Signed)
I spoke to [employer] and she confirmed that the patient received shipments of incontinence supplies in Feb and March and will call when she needs her shipment for April.  Lynn Martinez stated that nothing is needed from the PCP at this time

## 2022-09-28 ENCOUNTER — Other Ambulatory Visit (HOSPITAL_COMMUNITY): Payer: Self-pay

## 2022-09-28 ENCOUNTER — Telehealth: Payer: Self-pay

## 2022-09-28 NOTE — Telephone Encounter (Signed)
PA request received via CMM for Farxiga   PA has been submitted to [facility] and is pending determination  Confirmation: [id number] W Provider NPI: [id number]

## 2022-10-06 NOTE — Telephone Encounter (Signed)
PA has been DENIED. No additional information provided at this time.

## 2022-10-11 ENCOUNTER — Ambulatory Visit: Payer: Medicaid Other | Admitting: Podiatry

## 2022-11-09 ENCOUNTER — Other Ambulatory Visit: Payer: Self-pay | Admitting: Pharmacist

## 2022-11-09 ENCOUNTER — Telehealth: Payer: Self-pay | Admitting: Critical Care Medicine

## 2022-11-09 ENCOUNTER — Telehealth: Payer: Self-pay

## 2022-11-09 MED ORDER — CLINDAMYCIN PHOS-BENZOYL PEROX 1.2-5 % EX GEL: 1.0000 | Freq: Two times a day (BID) | CUTANEOUS | 0 refills | Status: DC

## 2022-11-09 NOTE — Telephone Encounter (Signed)
Patient was advised a PA was needed for clindamycin-benzoyl peroxide (BENZACLIN) gel. Please file PA with Medicaid, Member ID # [id number] effective date 05/2022 - current. Please follow up with patient regarding the status.

## 2022-11-09 NOTE — Telephone Encounter (Signed)
Patient states that insurance need PA for the [place]. Can we reprocess the request .

## 2022-11-09 NOTE — Telephone Encounter (Signed)
Needs to be done through [facility]

## 2022-11-10 ENCOUNTER — Telehealth: Payer: Self-pay

## 2022-11-10 ENCOUNTER — Other Ambulatory Visit (HOSPITAL_COMMUNITY): Payer: Self-pay

## 2022-11-10 NOTE — Telephone Encounter (Signed)
Looks like his PA for [name] was denied on 10/06/22

## 2022-11-10 NOTE — Telephone Encounter (Signed)
Patient Advocate Encounter   Received notification from pt msgs that prior authorization is required for Farxiga 10mg   Submitted: 11/10/22 via [employer] [id number]  Determination will be made in 24 hours

## 2022-11-11 ENCOUNTER — Other Ambulatory Visit: Payer: Self-pay | Admitting: Critical Care Medicine

## 2022-11-11 DIAGNOSIS — F3162 Bipolar disorder, current episode mixed, moderate: Secondary | ICD-10-CM

## 2022-11-11 DIAGNOSIS — E785 Hyperlipidemia, unspecified: Secondary | ICD-10-CM

## 2022-11-11 DIAGNOSIS — E1169 Type 2 diabetes mellitus with other specified complication: Secondary | ICD-10-CM

## 2022-11-11 DIAGNOSIS — G43909 Migraine, unspecified, not intractable, without status migrainosus: Secondary | ICD-10-CM

## 2022-11-15 NOTE — Telephone Encounter (Signed)
Pharmacy Patient Advocate Encounter  Second PA for Lynn Martinez was also DENIED.   No additional info available at this time

## 2022-11-18 ENCOUNTER — Other Ambulatory Visit (HOSPITAL_COMMUNITY): Payer: Self-pay

## 2022-11-18 NOTE — Telephone Encounter (Signed)
PA for generic Duac not needed at this time.

## 2022-11-22 ENCOUNTER — Encounter (HOSPITAL_COMMUNITY): Payer: Self-pay

## 2022-11-22 ENCOUNTER — Telehealth: Payer: Self-pay | Admitting: Critical Care Medicine

## 2022-11-22 ENCOUNTER — Telehealth: Payer: Self-pay

## 2022-11-22 ENCOUNTER — Other Ambulatory Visit: Payer: Self-pay

## 2022-11-22 ENCOUNTER — Other Ambulatory Visit (HOSPITAL_COMMUNITY): Payer: Self-pay

## 2022-11-22 ENCOUNTER — Emergency Department (HOSPITAL_COMMUNITY)
Admission: EM | Admit: 2022-11-22 | Discharge: 2022-11-23 | Disposition: A | Discharge status: Home / Self Care | Payer: Medicaid Other | Attending: Emergency Medicine | Admitting: Emergency Medicine

## 2022-11-22 DIAGNOSIS — E039 Hypothyroidism, unspecified: Secondary | ICD-10-CM | POA: Diagnosis not present

## 2022-11-22 DIAGNOSIS — I10 Essential (primary) hypertension: Secondary | ICD-10-CM | POA: Diagnosis not present

## 2022-11-22 DIAGNOSIS — B3731 Acute candidiasis of vulva and vagina: Secondary | ICD-10-CM | POA: Insufficient documentation

## 2022-11-22 DIAGNOSIS — Z79899 Other long term (current) drug therapy: Secondary | ICD-10-CM | POA: Insufficient documentation

## 2022-11-22 DIAGNOSIS — G43809 Other migraine, not intractable, without status migrainosus: Secondary | ICD-10-CM | POA: Insufficient documentation

## 2022-11-22 DIAGNOSIS — Z780 Asymptomatic menopausal state: Secondary | ICD-10-CM

## 2022-11-22 DIAGNOSIS — Z85850 Personal history of malignant neoplasm of thyroid: Secondary | ICD-10-CM | POA: Diagnosis not present

## 2022-11-22 DIAGNOSIS — E119 Type 2 diabetes mellitus without complications: Secondary | ICD-10-CM | POA: Insufficient documentation

## 2022-11-22 DIAGNOSIS — R519 Headache, unspecified: Secondary | ICD-10-CM | POA: Diagnosis present

## 2022-11-22 DIAGNOSIS — Z794 Long term (current) use of insulin: Secondary | ICD-10-CM | POA: Insufficient documentation

## 2022-11-22 LAB — URINALYSIS, ROUTINE W REFLEX MICROSCOPIC
Bacteria, UA: NONE SEEN
Bilirubin Urine: NEGATIVE
Glucose, UA: >=500 mg/dL — AB
Hgb urine dipstick: NEGATIVE
Ketones, ur: NEGATIVE mg/dL
Leukocytes,Ua: NEGATIVE
Nitrite: NEGATIVE
Protein, ur: NEGATIVE mg/dL
Specific Gravity, Urine: 1.027 (ref 1.005–1.030)
pH: 5 (ref 5.0–8.0)

## 2022-11-22 MED ORDER — DIPHENHYDRAMINE HCL 50 MG/ML IJ SOLN
12.5000 mg | Freq: Once | INTRAMUSCULAR | Status: AC
  Administered 2022-11-23: 12.5 mg via INTRAVENOUS
  Filled 2022-11-22: qty 1

## 2022-11-22 MED ORDER — OMNIPOD 5 DEXG7G6 PODS GEN 5 MISC: 1.0000 | 2 refills | Status: DC

## 2022-11-22 MED ORDER — METOCLOPRAMIDE HCL 5 MG/ML IJ SOLN
10.0000 mg | Freq: Once | INTRAMUSCULAR | Status: AC
  Administered 2022-11-23: 10 mg via INTRAVENOUS
  Filled 2022-11-22: qty 2

## 2022-11-22 MED ORDER — MAGNESIUM SULFATE 2 GM/50ML IV SOLN
2.0000 g | Freq: Once | INTRAVENOUS | Status: AC
  Administered 2022-11-23: 2 g via INTRAVENOUS
  Filled 2022-11-22: qty 50

## 2022-11-22 MED ORDER — OMNIPOD 5 DEXG7G6 INTRO GEN 5 KIT: 1.0000 | PACK | 0 refills | Status: DC

## 2022-11-22 NOTE — Telephone Encounter (Signed)
GYN appts too far out. We can send rx for yeast and need more info on how eff got in vagina

## 2022-11-22 NOTE — Telephone Encounter (Signed)
Patient Advocate Encounter   Received notification from pt msgs that prior authorization is required for Omnipod 5 G6 kit and pods   Per test claims:      Neither require a PA. Copay is $0 for both

## 2022-11-22 NOTE — Telephone Encounter (Signed)
Patient would like the Omnipod 5 pods and kit sent to [facility] in [place].

## 2022-11-22 NOTE — Telephone Encounter (Signed)
Patients needs  a PA for the Omnipod 5 pods and Kit.      Tracks [id number]

## 2022-11-22 NOTE — ED Triage Notes (Signed)
Patient reports having a migraine and feeling unwell with nausea. Patient states headache began today and got worse upon laying down. Also reports feeling unwell for a few days and having vaginal itching. She thinks she has a yeast infection.

## 2022-11-22 NOTE — Telephone Encounter (Signed)
Copied from CRM [id number]. Topic: Referral - Request for Referral >> Nov 22, 2022  9:44 AM Clide Dales wrote: Has patient seen PCP for this complaint? No. *If NO, is insurance requiring patient see PCP for this issue before PCP can refer them? Referral for which specialty: Gynecology/OBYGN Preferred provider/office:  Reason for referral: Yeast Infection/Possible egg retrieval

## 2022-11-23 LAB — WET PREP, GENITAL
Clue Cells Wet Prep HPF POC: NONE SEEN
Sperm: NONE SEEN
Trich, Wet Prep: NONE SEEN
WBC, Wet Prep HPF POC: <10 (ref <10)

## 2022-11-23 MED ORDER — FLUCONAZOLE 100 MG PO TABS: 100.0000 mg | ORAL_TABLET | Freq: Every day | ORAL | 0 refills | Status: DC

## 2022-11-23 NOTE — ED Provider Notes (Signed)
[place] EMERGENCY DEPARTMENT AT [facility] Provider Note   CSN: [id number] Arrival date & time: 11/22/22  2207     History  Chief Complaint  Patient presents with   Headache   Vaginal Itching    Lynn Martinez Lynn Martinez is a 52 y.o. female.  The history is provided by the patient.  Headache Pain location:  Frontal Quality:  Dull Radiates to:  Does not radiate Onset quality:  Gradual Duration:  5 hours Timing:  Constant Progression:  Worsening Chronicity:  Recurrent Context: not activity   Relieved by:  Nothing Worsened by:  Nothing Ineffective treatments:  None tried Associated symptoms: no dizziness, no facial pain, no fever, no neck pain, no neck stiffness, no numbness, no sore throat and no weakness   Associated symptoms comment:  Also having vaginal itching.   Vaginal Itching Associated symptoms include headaches.  Diabetic patient with h/o migraines with vaginal itching and frontal HA.  No weakness no numbness no changes in vision or speech.    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 Authorizing Provider  fluconazole (DIFLUCAN) 100 MG tablet Take 1  tablet (100 mg total) by mouth daily. 11/23/22  Yes Takeo Harts, MD  atorvastatin (LIPITOR) 10 MG tablet TAKE 1 TABLET BY MOUTH EVERYDAY AT BEDTIME 11/11/22   Storm Frisk, MD  citalopram (CELEXA) 40 MG tablet TAKE 1 TABLET (40 MG TOTAL) BY MOUTH DAILY. OFFICE VISIT NEEDED FOR ADDITIONAL REFILLS 07/26/22   Storm Frisk, MD  Clindamycin-Benzoyl Per, Refr, gel Apply 1 Application topically 2 (two) times daily. 11/09/22   Storm Frisk, MD  clotrimazole-betamethasone (LOTRISONE) cream Apply 1 Application topically 2 (two) times daily. 02/08/22   Storm Frisk, MD  Continuous Blood Gluc Sensor (DEXCOM G7 SENSOR) MISC 1 Device by Does not apply route as directed. 01/28/22   Shamleffer, Konrad Dolores, MD  FARXIGA 10 MG TABS tablet TAKE 1 TABLET EVERY DAY BEFORE BREAKFAST 09/10/22   Shamleffer, Konrad Dolores, MD  gabapentin (NEURONTIN) 300 MG capsule Take 2 capsules (600 mg total) by mouth 2 (two) times daily. 07/12/22 07/12/23  Storm Frisk, MD  hydrOXYzine (ATARAX) 25 MG tablet TAKE 1 TABLET BY MOUTH THREE TIMES A DAY 08/08/22   Storm Frisk, MD  Insulin Disposable Pump (OMNIPOD 5 G6 INTRO, GEN 5,) KIT 1 Device by Does not apply route every other day. 11/22/22   Shamleffer, Konrad Dolores, MD  Insulin Disposable Pump (  OMNIPOD 5 G6 PODS, GEN 5,) MISC 1 Device by Does not apply route every other day. 11/22/22   Shamleffer, Konrad Dolores, MD  Insulin Glargine (BASAGLAR KWIKPEN) 100 UNIT/ML Inject 45 Units into the skin at bedtime. 07/12/22   Storm Frisk, MD  insulin lispro (HUMALOG KWIKPEN) 100 UNIT/ML KwikPen Max Daily 20 units 07/12/22   Storm Frisk, MD  Insulin Pen Needle (BD PEN NEEDLE MICRO U/F) 32G X 6 MM MISC Use in the morning, at noon, evening, and at bedtime. 07/12/22   Storm Frisk, MD  levothyroxine (SYNTHROID) 150 MCG tablet Take 1 tablet (150 mcg total) by mouth daily. 07/12/22   Storm Frisk, MD  lithium carbonate (ESKALITH) 450 MG ER tablet TAKE 2  TABLETS BY MOUTH AT BEDTIME 11/11/22   Storm Frisk, MD  Lurasidone HCl 120 MG TABS Take 1 tablet (120 mg total) by mouth at bedtime. 07/12/22   Storm Frisk, MD  methocarbamol (ROBAXIN) 500 MG tablet TAKE 1 TABLET (500 MG TOTAL) BY MOUTH 3 (THREE) TIMES DAILY AS NEEDED FOR MUSCLE SPASMS. 07/12/22 07/12/23  Storm Frisk, MD  Multiple Vitamins-Minerals (ONE-A-DAY WOMENS PO) Take 1 tablet by mouth daily with breakfast.    [provider]  omeprazole (PRILOSEC) 40 MG capsule Take 1 capsule (40 mg total) by mouth daily. 07/12/22 07/12/23  Storm Frisk, MD  prazosin (MINIPRESS) 1 MG capsule TAKE 1 CAPSULE AT BEDTIME FOR SLEEP/NIGHTMARES 07/12/22   Storm Frisk, MD  propranolol (INDERAL) 40 MG tablet Take 1 tablet (40 mg total) by mouth in the morning and at bedtime. 11/11/22   Storm Frisk, MD  sennosides-docusate sodium (SENOKOT-S) 8.6-50 MG tablet Take 3 tablets by mouth daily. 05/24/21   Storm Frisk, MD  Vitamin D, Ergocalciferol, (DRISDOL) 1.25 MG (50000 UNIT) CAPS capsule Take 1 capsule (50,000 Units total) by mouth every Saturday. 07/16/22   Storm Frisk, MD  Wheat Dextrin (BENEFIBER) POWD Take 1 packet by mouth every other day.    [provider]      Allergies    Bee venom, Morphine, Tape, Codeine, Golytely [peg [id number]], Ibuprofen, Metformin and related, Penicillins, and Aspirin    Review of Systems   Review of Systems  Constitutional:  Negative for fever.  HENT:  Negative for sore throat.   Eyes:  Negative for visual disturbance.  Respiratory:  Negative for wheezing and stridor.   Genitourinary:        Vaginal itching   Musculoskeletal:  Negative for neck pain and neck stiffness.  Neurological:  Positive for headaches. Negative for dizziness, facial asymmetry, speech difficulty, weakness and numbness.  All other systems reviewed and are negative.   Physical Exam Updated Vital Signs BP 132/73 (BP Location: Left Arm)    Pulse 68   Temp 97.6 F (36.4 C) (Oral)   Resp 16   Ht 5\' 7"  (1.702 m)   Wt 113.4 kg   LMP 09/20/2011   SpO2 95%   BMI 39.16 kg/m  Physical Exam Vitals and nursing note reviewed.  Constitutional:      General: She is not in acute distress.    Appearance: Normal appearance. She is well-developed.  HENT:     Head: Normocephalic and atraumatic.     Nose: Nose normal.  Eyes:     Extraocular Movements: Extraocular movements intact.     Conjunctiva/sclera: Conjunctivae normal.     Pupils: Pupils are equal, round, and reactive to light.     Comments:  No proptosis, EOMI intact cognition disks sharp   Cardiovascular:     Rate and Rhythm: Normal rate and regular rhythm.     Pulses: Normal pulses.     Heart sounds: Normal heart sounds.  Pulmonary:     Effort: Pulmonary effort is normal. No respiratory distress.     Breath sounds: Normal breath sounds.  Abdominal:     General: Bowel sounds are normal. There is no distension.     Palpations: Abdomen is soft.     Tenderness: There is no abdominal tenderness. There is no guarding or rebound.  Genitourinary:    Vagina: No vaginal discharge.  Musculoskeletal:        General: Normal range of motion.     Cervical back: Neck supple.  Skin:    General: Skin is warm and dry.     Capillary Refill: Capillary refill takes less than 2 seconds.     Findings: No erythema or rash.  Neurological:     General: No focal deficit present.     Mental Status: She is alert and oriented to person, place, and time.     Cranial Nerves: No cranial nerve deficit.     Deep Tendon Reflexes: Reflexes normal.  Psychiatric:        Mood and Affect: Mood normal.     ED Results / Procedures / Treatments   Labs (all labs ordered are listed, but only abnormal results are displayed) Results for orders placed or performed during the hospital encounter of 11/22/22  Wet prep, genital  Result Value Ref Range   Yeast Wet Prep HPF POC PRESENT (A) NONE SEEN    Trich, Wet Prep NONE SEEN NONE SEEN   Clue Cells Wet Prep HPF POC NONE SEEN NONE SEEN   WBC, Wet Prep HPF POC <10 <10   Sperm NONE SEEN   Urinalysis, Routine w reflex microscopic -Urine, Clean Catch  Result Value Ref Range   Color, Urine YELLOW YELLOW   APPearance CLEAR CLEAR   Specific Gravity, Urine 1.027 1.005 - 1.030   pH 5.0 5.0 - 8.0   Glucose, UA >=500 (A) NEGATIVE mg/dL   Hgb urine dipstick NEGATIVE NEGATIVE   Bilirubin Urine NEGATIVE NEGATIVE   Ketones, ur NEGATIVE NEGATIVE mg/dL   Protein, ur NEGATIVE NEGATIVE mg/dL   Nitrite NEGATIVE NEGATIVE   Leukocytes,Ua NEGATIVE NEGATIVE   RBC / HPF 0-5 0 - 5 RBC/hpf   WBC, UA 0-5 0 - 5 WBC/hpf   Bacteria, UA NONE SEEN NONE SEEN   Squamous Epithelial / HPF 6-10 0 - 5 /HPF   No results found.   Radiology No results found.  Procedures Procedures    Medications Ordered in ED Medications  metoCLOPramide (REGLAN) injection 10 mg (10 mg Intravenous Given 11/23/22 0016)  diphenhydrAMINE (BENADRYL) injection 12.5 mg (12.5 mg Intravenous Given 11/23/22 0017)  magnesium sulfate IVPB 2 g 50 mL (2 g Intravenous New Bag/Given 11/23/22 0022)    ED Course/ Medical Decision Making/ A&P                             Medical Decision Making Frontal headache today did not take medication and vaginal itching for a few days   Amount and/or Complexity of Data Reviewed Independent Historian: spouse    Details: See above  External Data Reviewed: notes.    Details: Previous notes reviewed  Labs: ordered.    Details: Wet prep consistent with yeast vaginitis urine negative  for uti   Risk Prescription drug management. Risk Details: Well appearing with normal exam.  I do not believe this is an ICH, no signs of meningitis as no fever, rash and neck is supple.  Will treat for yeast infection.  Treated for migraine with relief of symptoms.  Stable for discharge with close follow up.     Final Clinical Impression(s) / ED Diagnoses Final  diagnoses:  Yeast vaginitis  Other migraine without status migrainosus, not intractable   Return for intractable cough, coughing up blood, fevers > 100.4 unrelieved by medication, shortness of breath, intractable vomiting, chest pain, shortness of breath, weakness, numbness, changes in speech, facial asymmetry, abdominal pain, passing out, Inability to tolerate liquids or food, cough, altered mental status or any concerns. No signs of systemic illness or infection. The patient is nontoxic-appearing on exam and vital signs are within normal limits.  I have reviewed the triage vital signs and the nursing notes. Pertinent labs & imaging results that were available during my care of the patient were reviewed by me and considered in my medical decision making (see chart for details). After history, exam, and medical workup I feel the patient has been appropriately medically screened and is safe for discharge home. Pertinent diagnoses were discussed with the patient. Patient was given return precautions Rx / DC Orders ED Discharge Orders          Ordered    fluconazole (DIFLUCAN) 100 MG tablet  Daily        11/23/22 0126              Makensie Mulhall, MD 11/23/22 0134

## 2022-11-23 NOTE — Telephone Encounter (Signed)
Called patient ans she has went to the urgent care for her yeast infection and has medication. She stated that she still wants the referral to discuss her eggs

## 2022-11-24 LAB — GC/CHLAMYDIA PROBE AMP (~~LOC~~) NOT AT ARMC
Chlamydia: NEGATIVE
Comment: NEGATIVE
Comment: NORMAL
Neisseria Gonorrhea: NEGATIVE

## 2022-11-24 NOTE — Telephone Encounter (Signed)
I spoke to the patient she is wanting to see if she can have her eggs from her ovaries donated.  Also would like assessment for menopause.  Will refer to gynecology

## 2022-11-24 NOTE — Addendum Note (Signed)
Addended by: Shan Levans E on: 11/24/2022 12:08 PM   Modules accepted: Orders

## 2022-12-06 ENCOUNTER — Telehealth: Payer: Self-pay | Admitting: Critical Care Medicine

## 2022-12-06 ENCOUNTER — Other Ambulatory Visit: Payer: Self-pay | Admitting: Pharmacist

## 2022-12-06 DIAGNOSIS — F411 Generalized anxiety disorder: Secondary | ICD-10-CM

## 2022-12-06 DIAGNOSIS — E1169 Type 2 diabetes mellitus with other specified complication: Secondary | ICD-10-CM

## 2022-12-06 DIAGNOSIS — F3162 Bipolar disorder, current episode mixed, moderate: Secondary | ICD-10-CM

## 2022-12-06 DIAGNOSIS — F515 Nightmare disorder: Secondary | ICD-10-CM

## 2022-12-06 DIAGNOSIS — R0789 Other chest pain: Secondary | ICD-10-CM

## 2022-12-06 DIAGNOSIS — G43909 Migraine, unspecified, not intractable, without status migrainosus: Secondary | ICD-10-CM

## 2022-12-06 DIAGNOSIS — M545 Low back pain, unspecified: Secondary | ICD-10-CM

## 2022-12-06 MED ORDER — HYDROXYZINE HCL 25 MG PO TABS
25.0000 mg | ORAL_TABLET | Freq: Three times a day (TID) | ORAL | 0 refills | Status: DC
Start: 2022-12-06 — End: 2023-01-24

## 2022-12-06 MED ORDER — PRAZOSIN HCL 1 MG PO CAPS
ORAL_CAPSULE | ORAL | 0 refills | Status: DC
Start: 2022-12-06 — End: 2022-12-13

## 2022-12-06 MED ORDER — ATORVASTATIN CALCIUM 10 MG PO TABS
ORAL_TABLET | ORAL | 0 refills | Status: DC
Start: 2022-12-06 — End: 2022-12-13

## 2022-12-06 MED ORDER — CITALOPRAM HYDROBROMIDE 40 MG PO TABS
40.0000 mg | ORAL_TABLET | Freq: Every day | ORAL | 0 refills | Status: DC
Start: 2022-12-06 — End: 2022-12-13

## 2022-12-06 MED ORDER — LURASIDONE HCL 120 MG PO TABS
120.0000 mg | ORAL_TABLET | Freq: Every day | ORAL | 0 refills | Status: DC
Start: 2022-12-06 — End: 2022-12-13

## 2022-12-06 MED ORDER — METHOCARBAMOL 500 MG PO TABS
ORAL_TABLET | ORAL | 0 refills | Status: DC
Start: 2022-12-06 — End: 2022-12-13

## 2022-12-06 MED ORDER — LEVOTHYROXINE SODIUM 150 MCG PO TABS: 150.0000 ug | ORAL_TABLET | Freq: Every day | ORAL | 0 refills | Status: DC

## 2022-12-06 MED ORDER — PROPRANOLOL HCL 40 MG PO TABS
40.0000 mg | ORAL_TABLET | Freq: Two times a day (BID) | ORAL | 0 refills | Status: DC
Start: 2022-12-06 — End: 2022-12-13

## 2022-12-06 MED ORDER — OMEPRAZOLE 40 MG PO CPDR
40.0000 mg | DELAYED_RELEASE_CAPSULE | Freq: Every day | ORAL | 0 refills | Status: DC
Start: 2022-12-06 — End: 2023-01-24

## 2022-12-06 NOTE — Telephone Encounter (Signed)
Lynn Martinez with [facility] pharmacy is calling in because they faxed over refill requests for about ten medications and wants to know the status of the fax. Lynn Martinez says she sent it over on the 21st. Please follow up with Lynn Martinez [phone]

## 2022-12-06 NOTE — Telephone Encounter (Signed)
Rxns sent for 1 month supply. She needs to keep upcoming appt next month for additional refills.

## 2022-12-07 ENCOUNTER — Other Ambulatory Visit: Payer: Self-pay | Admitting: Critical Care Medicine

## 2022-12-07 DIAGNOSIS — G8929 Other chronic pain: Secondary | ICD-10-CM

## 2022-12-13 ENCOUNTER — Encounter: Payer: Self-pay | Admitting: Critical Care Medicine

## 2022-12-13 ENCOUNTER — Ambulatory Visit: Payer: MEDICAID | Attending: Critical Care Medicine | Admitting: Critical Care Medicine

## 2022-12-13 ENCOUNTER — Other Ambulatory Visit: Payer: Self-pay | Admitting: Pharmacist

## 2022-12-13 ENCOUNTER — Other Ambulatory Visit: Payer: Self-pay

## 2022-12-13 VITALS — BP 130/84 | HR 59 | Wt 260.0 lb

## 2022-12-13 DIAGNOSIS — I10 Essential (primary) hypertension: Secondary | ICD-10-CM | POA: Insufficient documentation

## 2022-12-13 DIAGNOSIS — E1169 Type 2 diabetes mellitus with other specified complication: Secondary | ICD-10-CM

## 2022-12-13 DIAGNOSIS — Z79899 Other long term (current) drug therapy: Secondary | ICD-10-CM | POA: Diagnosis not present

## 2022-12-13 DIAGNOSIS — E785 Hyperlipidemia, unspecified: Secondary | ICD-10-CM | POA: Diagnosis not present

## 2022-12-13 DIAGNOSIS — Z5181 Encounter for therapeutic drug level monitoring: Secondary | ICD-10-CM | POA: Insufficient documentation

## 2022-12-13 DIAGNOSIS — G43909 Migraine, unspecified, not intractable, without status migrainosus: Secondary | ICD-10-CM | POA: Diagnosis not present

## 2022-12-13 DIAGNOSIS — Z794 Long term (current) use of insulin: Secondary | ICD-10-CM | POA: Diagnosis not present

## 2022-12-13 DIAGNOSIS — F319 Bipolar disorder, unspecified: Secondary | ICD-10-CM | POA: Diagnosis not present

## 2022-12-13 DIAGNOSIS — E1165 Type 2 diabetes mellitus with hyperglycemia: Secondary | ICD-10-CM | POA: Diagnosis not present

## 2022-12-13 DIAGNOSIS — F411 Generalized anxiety disorder: Secondary | ICD-10-CM | POA: Insufficient documentation

## 2022-12-13 DIAGNOSIS — Z7984 Long term (current) use of oral hypoglycemic drugs: Secondary | ICD-10-CM | POA: Insufficient documentation

## 2022-12-13 DIAGNOSIS — E039 Hypothyroidism, unspecified: Secondary | ICD-10-CM | POA: Insufficient documentation

## 2022-12-13 DIAGNOSIS — E282 Polycystic ovarian syndrome: Secondary | ICD-10-CM | POA: Insufficient documentation

## 2022-12-13 DIAGNOSIS — E890 Postprocedural hypothyroidism: Secondary | ICD-10-CM | POA: Diagnosis not present

## 2022-12-13 DIAGNOSIS — F3162 Bipolar disorder, current episode mixed, moderate: Secondary | ICD-10-CM

## 2022-12-13 DIAGNOSIS — M5412 Radiculopathy, cervical region: Secondary | ICD-10-CM | POA: Insufficient documentation

## 2022-12-13 DIAGNOSIS — G8929 Other chronic pain: Secondary | ICD-10-CM

## 2022-12-13 DIAGNOSIS — Z780 Asymptomatic menopausal state: Secondary | ICD-10-CM | POA: Insufficient documentation

## 2022-12-13 DIAGNOSIS — M545 Low back pain, unspecified: Secondary | ICD-10-CM

## 2022-12-13 DIAGNOSIS — F515 Nightmare disorder: Secondary | ICD-10-CM | POA: Insufficient documentation

## 2022-12-13 LAB — POCT GLYCOSYLATED HEMOGLOBIN (HGB A1C): HbA1c, POC (controlled diabetic range): 9 % — AB (ref 0.0–7.0)

## 2022-12-13 LAB — GLUCOSE, POCT (MANUAL RESULT ENTRY): POC Glucose: 203 mg/dl — AB (ref 70–99)

## 2022-12-13 MED ORDER — BASAGLAR KWIKPEN 100 UNIT/ML ~~LOC~~ SOPN: 55.0000 [IU] | PEN_INJECTOR | Freq: Every day | SUBCUTANEOUS | 3 refills | Status: DC

## 2022-12-13 MED ORDER — PROPRANOLOL HCL 40 MG PO TABS: 40.0000 mg | ORAL_TABLET | Freq: Two times a day (BID) | ORAL | Status: DC

## 2022-12-13 MED ORDER — VITAMIN D (ERGOCALCIFEROL) 1.25 MG (50000 UNIT) PO CAPS: 50000.0000 [IU] | ORAL_CAPSULE | ORAL | 2 refills | Status: DC

## 2022-12-13 MED ORDER — DEXCOM G7 RECEIVER DEVI: 0 refills | Status: DC

## 2022-12-13 MED ORDER — CITALOPRAM HYDROBROMIDE 40 MG PO TABS: 40.0000 mg | ORAL_TABLET | Freq: Every day | ORAL | 2 refills | Status: DC

## 2022-12-13 MED ORDER — DEXCOM G7 SENSOR MISC: 2 refills | Status: DC

## 2022-12-13 MED ORDER — LURASIDONE HCL 120 MG PO TABS: 120.0000 mg | ORAL_TABLET | Freq: Every day | ORAL | 2 refills | Status: DC

## 2022-12-13 MED ORDER — DEXCOM G7 SENSOR MISC
2 refills | Status: DC
Start: 2022-12-13 — End: 2023-06-27
  Filled 2022-12-13 – 2022-12-21 (×2): qty 3, 30d supply, fill #0

## 2022-12-13 MED ORDER — LITHIUM CARBONATE ER 450 MG PO TBCR
EXTENDED_RELEASE_TABLET | ORAL | 1 refills | Status: DC
Start: 2022-12-13 — End: 2023-01-24

## 2022-12-13 MED ORDER — DEXCOM G7 RECEIVER DEVI
0 refills | Status: DC
Start: 2022-12-13 — End: 2023-06-27
  Filled 2022-12-13: qty 1, 90d supply, fill #0
  Filled 2022-12-21: qty 1, 1d supply, fill #0

## 2022-12-13 MED ORDER — METHOCARBAMOL 500 MG PO TABS: ORAL_TABLET | ORAL | 0 refills | Status: DC

## 2022-12-13 MED ORDER — DAPAGLIFLOZIN PROPANEDIOL 10 MG PO TABS: ORAL_TABLET | ORAL | 11 refills | Status: DC

## 2022-12-13 MED ORDER — BD PEN NEEDLE MICRO U/F 32G X 6 MM MISC: 1.0000 | Freq: Four times a day (QID) | 3 refills | Status: DC

## 2022-12-13 MED ORDER — INSULIN LISPRO (1 UNIT DIAL) 100 UNIT/ML (KWIKPEN): PEN_INJECTOR | SUBCUTANEOUS | 11 refills | Status: DC

## 2022-12-13 MED ORDER — LEVOTHYROXINE SODIUM 150 MCG PO TABS: 150.0000 ug | ORAL_TABLET | Freq: Every day | ORAL | 2 refills | Status: DC

## 2022-12-13 MED ORDER — PRAZOSIN HCL 1 MG PO CAPS
ORAL_CAPSULE | ORAL | 1 refills | Status: DC
Start: 2022-12-13 — End: 2023-01-24

## 2022-12-13 MED ORDER — ATORVASTATIN CALCIUM 10 MG PO TABS
ORAL_TABLET | ORAL | 1 refills | Status: DC
Start: 2022-12-13 — End: 2023-01-24

## 2022-12-13 MED ORDER — GABAPENTIN 300 MG PO CAPS
600.0000 mg | ORAL_CAPSULE | Freq: Two times a day (BID) | ORAL | 3 refills | Status: DC
Start: 2022-12-13 — End: 2022-12-26

## 2022-12-13 NOTE — Assessment & Plan Note (Signed)
Patient getting new mental health doctor will refill lithium obtain labs lithium level continue other mental health medications

## 2022-12-13 NOTE — Assessment & Plan Note (Signed)
Type 2 diabetes poorly controlled plan for this patient will be to increase long-acting insulin to 55 units daily continue insulin lispro sliding scale attempted get a Dexcom G7 continuous glucose monitor.  Patient's cell phone will not except the application.  She will need a receiver.  Patient will need prior authorization.  Also continue [place].  Reassess labs and refer back to endocrinology.

## 2022-12-13 NOTE — Assessment & Plan Note (Signed)
Reassess lipids continue statin 

## 2022-12-13 NOTE — Patient Instructions (Signed)
Refills all medications, increase long acting insulin to 55 units daily Labs to be obtained  Referral to diabetic doctor made Return Dr Delford Field 6 weeks

## 2022-12-13 NOTE — Assessment & Plan Note (Signed)
Hypertension currently controlled off medication will monitor

## 2022-12-13 NOTE — Assessment & Plan Note (Addendum)
Continue methocarbamol and gabapentin

## 2022-12-13 NOTE — Assessment & Plan Note (Signed)
Reassess thyroid function refill Synthroid

## 2022-12-13 NOTE — Assessment & Plan Note (Signed)
History of PCOS and also now menopausal symptoms will refer to gynecology and start progestin

## 2022-12-13 NOTE — Assessment & Plan Note (Signed)
Improved on prazosin

## 2022-12-13 NOTE — Progress Notes (Signed)
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 EVERYDAY AT BEDTIME    Dispense:  90 tablet    Refill:  1   citalopram (CELEXA) 40 MG tablet    Sig: Take 1 tablet (40 mg total) by mouth daily. OFFICE VISIT NEEDED FOR ADDITIONAL REFILLS    Dispense:  30 tablet    Refill:  2   dapagliflozin propanediol (FARXIGA) 10 MG TABS tablet    Sig: TAKE 1 TABLET EVERY DAY BEFORE BREAKFAST    Dispense:  30 tablet    Refill:  11   gabapentin (NEURONTIN) 300 MG capsule    Sig: Take 2 capsules (600 mg total) by mouth 2 (two) times daily.    Dispense:  120 capsule    Refill:  3   Insulin Glargine (BASAGLAR KWIKPEN) 100 UNIT/ML    Sig: Inject 55 Units into the skin at bedtime.    Dispense:  30 mL    Refill:  3   insulin lispro (HUMALOG KWIKPEN) 100 UNIT/ML KwikPen    Sig: Max Daily 20 units    Dispense:  15 mL  Refill:  11   Insulin Pen  Needle (BD PEN NEEDLE MICRO U/F) 32G X 6 MM MISC    Sig: Use in the morning, at noon, evening, and at bedtime.    Dispense:  400 each    Refill:  3   levothyroxine (SYNTHROID) 150 MCG tablet    Sig: Take 1 tablet (150 mcg total) by mouth daily.    Dispense:  90 tablet    Refill:  2   lithium carbonate (ESKALITH) 450 MG ER tablet    Sig: TAKE 2 TABLETS BY MOUTH AT BEDTIME    Dispense:  120 tablet    Refill:  1   Lurasidone HCl 120 MG TABS    Sig: Take 1 tablet (120 mg total) by mouth at bedtime.    Dispense:  30 tablet    Refill:  2   methocarbamol (ROBAXIN) 500 MG tablet    Sig: TAKE 1 TABLET (500 MG TOTAL) BY MOUTH 3 (THREE) TIMES DAILY AS NEEDED FOR MUSCLE SPASMS.    Dispense:  90 tablet    Refill:  0   prazosin (MINIPRESS) 1 MG capsule    Sig: TAKE 1 CAPSULE AT BEDTIME FOR SLEEP/NIGHTMARES    Dispense:  60 capsule    Refill:  1   propranolol (INDERAL) 40 MG tablet    Sig: Take 1 tablet (40 mg total) by mouth in the morning and at bedtime.    Dispense:  120 tablet    Refill:  02   Vitamin D, Ergocalciferol, (DRISDOL) 1.25 MG (50000 UNIT) CAPS capsule    Sig: Take 1 capsule (50,000 Units total) by mouth every Saturday.    Dispense:  12 capsule    Refill:  2    Please keep upcoming appointment for more refills. We need to get an updated vitD(OH) level   38 minutes spent extra time needed with patient education multiple prescriptions  Follow-up: Return in about 6 weeks (around 01/24/2023) for diabetes.    Shan Levans, MD

## 2022-12-14 ENCOUNTER — Other Ambulatory Visit: Payer: Self-pay

## 2022-12-14 LAB — COMPREHENSIVE METABOLIC PANEL
ALT: 15 IU/L (ref 0–32)
AST: 12 IU/L (ref 0–40)
Albumin: 4.4 g/dL (ref 3.9–4.9)
Alkaline Phosphatase: 71 IU/L (ref 44–121)
BUN/Creatinine Ratio: 14 (ref 9–23)
BUN: 11 mg/dL (ref 6–24)
Bilirubin Total: 0.2 mg/dL (ref 0.0–1.2)
CO2: 25 mmol/L (ref 20–29)
Calcium: 9.6 mg/dL (ref 8.7–10.2)
Chloride: 100 mmol/L (ref 96–106)
Creatinine, Ser: 0.79 mg/dL (ref 0.57–1.00)
Globulin, Total: 2.8 g/dL (ref 1.5–4.5)
Glucose: 175 mg/dL — ABNORMAL HIGH (ref 70–99)
Potassium: 4.8 mmol/L (ref 3.5–5.2)
Sodium: 138 mmol/L (ref 134–144)
Total Protein: 7.2 g/dL (ref 6.0–8.5)
eGFR: 92 mL/min/{1.73_m2} (ref >59)

## 2022-12-14 LAB — LIPID PANEL
Chol/HDL Ratio: 3.3 ratio (ref 0.0–4.4)
Cholesterol, Total: 164 mg/dL (ref 100–199)
HDL: 50 mg/dL (ref >39)
LDL Chol Calc (NIH): 97 mg/dL (ref 0–99)
Triglycerides: 94 mg/dL (ref 0–149)
VLDL Cholesterol Cal: 17 mg/dL (ref 5–40)

## 2022-12-14 LAB — MICROALBUMIN / CREATININE URINE RATIO
Creatinine, Urine: 105.4 mg/dL
Microalb/Creat Ratio: 4 mg/g creat (ref 0–29)
Microalbumin, Urine: 4.7 ug/mL

## 2022-12-14 LAB — LITHIUM LEVEL: Lithium Lvl: 0.4 mmol/L — ABNORMAL LOW (ref 0.5–1.2)

## 2022-12-17 NOTE — Progress Notes (Signed)
Let pt know al labs normal

## 2022-12-19 ENCOUNTER — Telehealth: Payer: Self-pay

## 2022-12-19 ENCOUNTER — Other Ambulatory Visit: Payer: Self-pay

## 2022-12-19 NOTE — Telephone Encounter (Signed)
Pt was called and no vm was left due to mailbox not being set up.Information was sent to nurse pool.   ?

## 2022-12-19 NOTE — Telephone Encounter (Signed)
-----   Message from Storm Frisk, MD sent at 12/17/2022  7:10 AM EDT ----- Let pt know al labs normal

## 2022-12-20 ENCOUNTER — Other Ambulatory Visit: Payer: Self-pay

## 2022-12-21 ENCOUNTER — Other Ambulatory Visit: Payer: Self-pay

## 2022-12-22 ENCOUNTER — Encounter: Payer: Self-pay | Admitting: Dietician

## 2022-12-22 ENCOUNTER — Encounter: Payer: MEDICAID | Attending: Internal Medicine | Admitting: Dietician

## 2022-12-22 ENCOUNTER — Other Ambulatory Visit: Payer: Self-pay | Admitting: Critical Care Medicine

## 2022-12-22 ENCOUNTER — Other Ambulatory Visit: Payer: Self-pay | Admitting: Internal Medicine

## 2022-12-22 ENCOUNTER — Other Ambulatory Visit: Payer: Self-pay

## 2022-12-22 DIAGNOSIS — E118 Type 2 diabetes mellitus with unspecified complications: Secondary | ICD-10-CM | POA: Insufficient documentation

## 2022-12-22 DIAGNOSIS — Z794 Long term (current) use of insulin: Secondary | ICD-10-CM

## 2022-12-22 DIAGNOSIS — G8929 Other chronic pain: Secondary | ICD-10-CM

## 2022-12-22 NOTE — Progress Notes (Signed)
Patient is here today alone as a walk in.  She has an Omnipod 5 and Dexcom G7 with her.  She reports her phone is not compatible with the Dexcom and has a receiver.  Discussed that the [facility] DASH training will need to be completed at a separate appointment. She was last seen by this RD on 09/04/2020 and had been trained on the Omnipod Insulin Pump on 08/19/2020.  She states that she did not consistently use this and could not remember the name. Patient reports increased stress due to caring for her mother who has cancer and cardiac issues but notes that she needs to begin caring for herself if she can continue to care for her mother. She forgets her meal time insulin frequently and reports frequent blood glucose over 300.  Dexcom G6 Personal CGM Training  Start time:1100    End time: 1130 Total time: 30 [address] Conchita Paris was educated about the following:  -Getting to know device    (Receiver:  programmed ) -Setting up device (high alert  300  , low alert 70  ) -Setting alert profile -Inserting sensor ( right arm  WNL) -Reviewed insulin dosing from dexcom.   Patient has [facility] tech support and my contact information.  Reviewed tips to remember to take her insulin such as leaving the long acting insulin by her bed and the meal time insulin where she eats. Patient has an appointment to follow up with her endocrinologist at the end of August. Appointment made with myself to review nutrition and glucose control in a couple of weeks. Will obtain a training appointment for her on the Omnipod 5 in the future. She has been added to the [facility] Clarity portal.

## 2022-12-22 NOTE — Telephone Encounter (Signed)
Requested medication (s) are due for refill today - no  Requested medication (s) are on the active medication list -yes  Future visit scheduled -yes  Last refill: 12/13/22 #90  Notes to clinic: non delegated Rx  Requested Prescriptions  Pending Prescriptions Disp Refills   methocarbamol (ROBAXIN) 500 MG tablet [Pharmacy Med Name: METHOCARBAMOL 500 MG TABS 500 Tablet] 90 tablet 10    Sig: TAKE 1 TABLET (500 MG TOTAL) BY MOUTH 3 (THREE) TIMES DAILY AS NEEDED FOR MUSCLE SPASMS.     Not Delegated - Analgesics:  Muscle Relaxants Failed - 12/22/2022  9:31 AM      Failed - This refill cannot be delegated      Passed - Valid encounter within last 6 months    Recent Outpatient Visits           1 week ago Type 2 diabetes mellitus with hyperglycemia, with long-term current use of insulin [facility])   [place] [facility] Storm Frisk, MD   5 months ago Type 2 diabetes mellitus with hyperglycemia, with long-term current use of insulin [facility])   [place] [facility] Storm Frisk, MD   10 months ago Type 2 diabetes mellitus with hyperglycemia, with long-term current use of insulin [facility])   [place] [facility] & [facility] Storm Frisk, MD   1 year ago Type 2 diabetes mellitus with hyperglycemia, with long-term current use of insulin [facility])   [place] [facility] & [facility] Storm Frisk, MD   1 year ago Type 2 diabetes mellitus with hyperglycemia, with long-term current use of insulin [facility])   [place] [facility] & [facility] Storm Frisk, MD       Future Appointments             In 1 month Delford Field Charlcie Cradle, MD [place] [facility]               Requested Prescriptions  Pending Prescriptions Disp Refills   methocarbamol (ROBAXIN) 500 MG tablet [Pharmacy Med Name: METHOCARBAMOL 500 MG TABS 500 Tablet] 90 tablet 10    Sig: TAKE 1 TABLET (500 MG  TOTAL) BY MOUTH 3 (THREE) TIMES DAILY AS NEEDED FOR MUSCLE SPASMS.     Not Delegated - Analgesics:  Muscle Relaxants Failed - 12/22/2022  9:31 AM      Failed - This refill cannot be delegated      Passed - Valid encounter within last 6 months    Recent Outpatient Visits           1 week ago Type 2 diabetes mellitus with hyperglycemia, with long-term current use of insulin [facility])   [place] [facility] Storm Frisk, MD   5 months ago Type 2 diabetes mellitus with hyperglycemia, with long-term current use of insulin [facility])   [place] [facility] & [facility] Storm Frisk, MD   10 months ago Type 2 diabetes mellitus with hyperglycemia, with long-term current use of insulin [facility])   [place] [facility] & [facility] Storm Frisk, MD   1 year ago Type 2 diabetes mellitus with hyperglycemia, with long-term current use of insulin [facility])   [place] [facility] Storm Frisk, MD   1 year ago Type 2 diabetes mellitus with hyperglycemia, with long-term current use of insulin (HCC)    [facility]  Storm Frisk, MD       Future Appointments             In 1 month Delford Field Charlcie Cradle, MD [facility]

## 2022-12-26 ENCOUNTER — Other Ambulatory Visit: Payer: Self-pay | Admitting: Critical Care Medicine

## 2022-12-26 DIAGNOSIS — G8929 Other chronic pain: Secondary | ICD-10-CM

## 2022-12-26 NOTE — Telephone Encounter (Signed)
Medication Refill - Medication: gabapentin (NEURONTIN) 300 MG capsule  Clindamycin-Benzoyl Per, Refr, gel   dapagliflozin propanediol (FARXIGA) 10 MG TABS tablet   Has the patient contacted their pharmacy? Yes.  Pharmacy was calling on patients behalf  Preferred Pharmacy (with phone number or street name): [facility] [address], [place] - [address]  Phone: [phone] Fax: [phone]  Has the patient been seen for an appointment in the last year OR does the patient have an upcoming appointment? Yes.  F/U with PCP on 8.13.24

## 2022-12-27 MED ORDER — CLINDAMYCIN PHOS-BENZOYL PEROX 1.2-5 % EX GEL: 1.0000 | Freq: Two times a day (BID) | CUTANEOUS | 0 refills | Status: DC

## 2022-12-27 MED ORDER — DAPAGLIFLOZIN PROPANEDIOL 10 MG PO TABS: ORAL_TABLET | ORAL | 11 refills | Status: DC

## 2022-12-27 MED ORDER — GABAPENTIN 300 MG PO CAPS: 600.0000 mg | ORAL_CAPSULE | Freq: Two times a day (BID) | ORAL | 3 refills | Status: DC

## 2022-12-27 NOTE — Telephone Encounter (Signed)
[facility] to verify Rx status due to Rx sent on 12/13/22 for refill. Pharmacy staff Selena Batten reports patient has not picked up medications.

## 2022-12-27 NOTE — Telephone Encounter (Signed)
Requesting refills at mail delivery pharmacy . [facility] Rx request sent to on 12/13/22. Pharmacy staff reports patient did not pick up Rxs. From 12/13/22. Will send refill request to [facility].  Requested Prescriptions  Pending Prescriptions Disp Refills   gabapentin (NEURONTIN) 300 MG capsule 120 capsule 3    Sig: Take 2 capsules (600 mg total) by mouth 2 (two) times daily.     Neurology: Anticonvulsants - gabapentin Passed - 12/26/2022 11:56 AM      Passed - Cr in normal range and within 360 days    Creatinine, Ser  Date Value Ref Range Status  12/13/2022 0.79 0.57 - 1.00 mg/dL Final   Creatinine,U  Date Value Ref Range Status  05/20/2021 81.0 mg/dL Final         Passed - Completed PHQ-2 or PHQ-9 in the last 360 days      Passed - Valid encounter within last 12 months    Recent Outpatient Visits           2 weeks ago Type 2 diabetes mellitus with hyperglycemia, with long-term current use of insulin (HCC)   [place] [facility] Storm Frisk, MD   5 months ago Type 2 diabetes mellitus with hyperglycemia, with long-term current use of insulin [facility])   [place] [facility] & [facility] Storm Frisk, MD   10 months ago Type 2 diabetes mellitus with hyperglycemia, with long-term current use of insulin [facility])   [place] [facility] Storm Frisk, MD   1 year ago Type 2 diabetes mellitus with hyperglycemia, with long-term current use of insulin [facility])   [place] [facility] Storm Frisk, MD   1 year ago Type 2 diabetes mellitus with hyperglycemia, with long-term current use of insulin [facility])   [place] [facility] Storm Frisk, MD       Future Appointments             In 4 weeks Storm Frisk, MD [place] [facility]             Clindamycin-Benzoyl Per, Refr, gel 45 g 0    Sig: Apply 1 Application  topically 2 (two) times daily.     Dermatology:  Acne preparations Passed - 12/26/2022 11:56 AM      Passed - Valid encounter within last 12 months    Recent Outpatient Visits           2 weeks ago Type 2 diabetes mellitus with hyperglycemia, with long-term current use of insulin [facility])   [place] [facility] Storm Frisk, MD   5 months ago Type 2 diabetes mellitus with hyperglycemia, with long-term current use of insulin [facility])   [place] [facility] & [facility] Storm Frisk, MD   10 months ago Type 2 diabetes mellitus with hyperglycemia, with long-term current use of insulin [facility])   [place] [facility] Storm Frisk, MD   1 year ago Type 2 diabetes mellitus with hyperglycemia, with long-term current use of insulin [facility])   [place] Storm Frisk, MD   1 year ago Type 2 diabetes mellitus with hyperglycemia, with long-term current use of insulin [facility])    [place] [facility] & [facility] Storm Frisk, MD       Future Appointments  In 4 weeks Storm Frisk, MD [place] [facility]             dapagliflozin propanediol (FARXIGA) 10 MG TABS tablet 30 tablet 11    Sig: TAKE 1 TABLET EVERY DAY BEFORE BREAKFAST     Endocrinology:  Diabetes - SGLT2 Inhibitors Failed - 12/26/2022 11:56 AM      Failed - HBA1C is between 0 and 7.9 and within 180 days    HbA1c, POC (prediabetic range)  Date Value Ref Range Status  03/15/2018 11.1 (A) 5.7 - 6.4 % Final   HbA1c, POC (controlled diabetic range)  Date Value Ref Range Status  12/13/2022 9.0 (A) 0.0 - 7.0 % Final         Passed - Cr in normal range and within 360 days    Creatinine, Ser  Date Value Ref Range Status  12/13/2022 0.79 0.57 - 1.00 mg/dL Final   Creatinine,U  Date Value Ref Range Status  05/20/2021 81.0 mg/dL Final         Passed - eGFR in  normal range and within 360 days    GFR calc Af Amer  Date Value Ref Range Status  07/09/2020 108 >59 mL/min/1.73 Final    Comment:    **In accordance with recommendations from the NKF-ASN Task force,**   [facility] is in the process of updating its eGFR calculation to the   2021 CKD-EPI creatinine equation that estimates kidney function   without a race variable.    GFR, Estimated  Date Value Ref Range Status  05/12/2021 >60 >60 mL/min Final    Comment:    (NOTE) Calculated using the CKD-EPI Creatinine Equation (2021)    GFR  Date Value Ref Range Status  08/13/2010 95.20 >60.00 mL/min Final   eGFR  Date Value Ref Range Status  12/13/2022 92 >59 mL/min/1.73 Final         Passed - Valid encounter within last 6 months    Recent Outpatient Visits           2 weeks ago Type 2 diabetes mellitus with hyperglycemia, with long-term current use of insulin (HCC)   [place] [facility] Storm Frisk, MD   5 months ago Type 2 diabetes mellitus with hyperglycemia, with long-term current use of insulin [facility])   [facility] Storm Frisk, MD   10 months ago Type 2 diabetes mellitus with hyperglycemia, with long-term current use of insulin [facility])   [place] [facility] & [facility] Storm Frisk, MD   1 year ago Type 2 diabetes mellitus with hyperglycemia, with long-term current use of insulin [facility])   [place] [facility] Storm Frisk, MD   1 year ago Type 2 diabetes mellitus with hyperglycemia, with long-term current use of insulin [facility])   [place] [facility] & [facility] Storm Frisk, MD       Future Appointments             In 4 weeks Storm Frisk, MD [facility]

## 2022-12-27 NOTE — Telephone Encounter (Signed)
Requested medication (s) are due for refill today: yes   Requested medication (s) are on the active medication list: yes   Last refill:  11/09/22 #45g 0 refills   Future visit scheduled: yes in 4 weeks   Notes to clinic:   no refills remain. Do you want to refill Rx?     Requested Prescriptions  Pending Prescriptions Disp Refills   Clindamycin-Benzoyl Per, Refr, gel 45 g 0    Sig: Apply 1 Application topically 2 (two) times daily.     Dermatology:  Acne preparations Passed - 12/26/2022 11:56 AM      Passed - Valid encounter within last 12 months    Recent Outpatient Visits           2 weeks ago Type 2 diabetes mellitus with hyperglycemia, with long-term current use of insulin [facility])   [place] [facility] Storm Frisk, MD   5 months ago Type 2 diabetes mellitus with hyperglycemia, with long-term current use of insulin [facility])   [place] [facility] Storm Frisk, MD   10 months ago Type 2 diabetes mellitus with hyperglycemia, with long-term current use of insulin [facility])   [facility] & [facility] Storm Frisk, MD   1 year ago Type 2 diabetes mellitus with hyperglycemia, with long-term current use of insulin [facility])   [place] [facility] & [facility] Storm Frisk, MD   1 year ago Type 2 diabetes mellitus with hyperglycemia, with long-term current use of insulin [facility])   [place] [facility] Storm Frisk, MD       Future Appointments             In 4 weeks Storm Frisk, MD [place] [facility]            Signed Prescriptions Disp Refills   gabapentin (NEURONTIN) 300 MG capsule 120 capsule 3    Sig: Take 2 capsules (600 mg total) by mouth 2 (two) times daily.     Neurology: Anticonvulsants - gabapentin Passed - 12/26/2022 11:56 AM      Passed - Cr in normal range and within 360 days    Creatinine, Ser  Date  Value Ref Range Status  12/13/2022 0.79 0.57 - 1.00 mg/dL Final   Creatinine,U  Date Value Ref Range Status  05/20/2021 81.0 mg/dL Final         Passed - Completed PHQ-2 or PHQ-9 in the last 360 days      Passed - Valid encounter within last 12 months    Recent Outpatient Visits           2 weeks ago Type 2 diabetes mellitus with hyperglycemia, with long-term current use of insulin [facility])   [place] [facility] Storm Frisk, MD   5 months ago Type 2 diabetes mellitus with hyperglycemia, with long-term current use of insulin [facility])   [place] [facility] & [facility] Storm Frisk, MD   10 months ago Type 2 diabetes mellitus with hyperglycemia, with long-term current use of insulin [facility])   [place] [facility] Storm Frisk, MD   1 year ago Type 2 diabetes mellitus with hyperglycemia, with long-term current use of insulin [facility]   [place] [facility] Storm Frisk, MD   1 year ago Type 2 diabetes mellitus with hyperglycemia,  with long-term current use of insulin [facility])   [place] [facility] & [facility] Storm Frisk, MD       Future Appointments             In 4 weeks Storm Frisk, MD [facility]             dapagliflozin propanediol (FARXIGA) 10 MG TABS tablet 30 tablet 11    Sig: TAKE 1 TABLET EVERY DAY BEFORE BREAKFAST     Endocrinology:  Diabetes - SGLT2 Inhibitors Failed - 12/26/2022 11:56 AM      Failed - HBA1C is between 0 and 7.9 and within 180 days    HbA1c, POC (prediabetic range)  Date Value Ref Range Status  03/15/2018 11.1 (A) 5.7 - 6.4 % Final   HbA1c, POC (controlled diabetic range)  Date Value Ref Range Status  12/13/2022 9.0 (A) 0.0 - 7.0 % Final         Passed - Cr in normal range and within 360 days    Creatinine, Ser  Date Value Ref Range Status  12/13/2022 0.79 0.57 - 1.00 mg/dL  Final   Creatinine,U  Date Value Ref Range Status  05/20/2021 81.0 mg/dL Final         Passed - eGFR in normal range and within 360 days    GFR calc Af Amer  Date Value Ref Range Status  07/09/2020 108 >59 mL/min/1.73 Final    Comment:    **In accordance with recommendations from the NKF-ASN Task force,**   [facility] is in the process of updating its eGFR calculation to the   2021 CKD-EPI creatinine equation that estimates kidney function   without a race variable.    GFR, Estimated  Date Value Ref Range Status  05/12/2021 >60 >60 mL/min Final    Comment:    (NOTE) Calculated using the CKD-EPI Creatinine Equation (2021)    GFR  Date Value Ref Range Status  08/13/2010 95.20 >60.00 mL/min Final   eGFR  Date Value Ref Range Status  12/13/2022 92 >59 mL/min/1.73 Final         Passed - Valid encounter within last 6 months    Recent Outpatient Visits           2 weeks ago Type 2 diabetes mellitus with hyperglycemia, with long-term current use of insulin (HCC)   [place] [facility] Storm Frisk, MD   5 months ago Type 2 diabetes mellitus with hyperglycemia, with long-term current use of insulin [facility])   [place] [facility] & [facility] Storm Frisk, MD   10 months ago Type 2 diabetes mellitus with hyperglycemia, with long-term current use of insulin [facility])   [place] [facility] Storm Frisk, MD   1 year ago Type 2 diabetes mellitus with hyperglycemia, with long-term current use of insulin [facility])   [place] [facility] Storm Frisk, MD   1 year ago Type 2 diabetes mellitus with hyperglycemia, with long-term current use of insulin [place] [facility])   [place] [facility] Storm Frisk, MD       Future Appointments             In 4 weeks Storm Frisk, MD [facility]

## 2022-12-30 ENCOUNTER — Other Ambulatory Visit: Payer: Self-pay | Admitting: Critical Care Medicine

## 2022-12-30 DIAGNOSIS — G8929 Other chronic pain: Secondary | ICD-10-CM

## 2023-01-02 ENCOUNTER — Other Ambulatory Visit: Payer: Self-pay | Admitting: Critical Care Medicine

## 2023-01-02 DIAGNOSIS — G43909 Migraine, unspecified, not intractable, without status migrainosus: Secondary | ICD-10-CM

## 2023-01-02 DIAGNOSIS — E1169 Type 2 diabetes mellitus with other specified complication: Secondary | ICD-10-CM

## 2023-01-02 DIAGNOSIS — R0789 Other chest pain: Secondary | ICD-10-CM

## 2023-01-02 NOTE — Telephone Encounter (Signed)
Unable to refill per protocol, Rx request is too soon. Last refill 12/22/22 for 30 days.  Requested Prescriptions  Pending Prescriptions Disp Refills   methocarbamol (ROBAXIN) 500 MG tablet [Pharmacy Med Name: METHOCARBAMOL 500 MG TABS 500 Tablet] 90 tablet 10    Sig: TAKE 1 TABLET (500 MG TOTAL) BY MOUTH 3 (THREE) TIMES DAILY AS NEEDED FOR MUSCLE SPASMS.     Not Delegated - Analgesics:  Muscle Relaxants Failed - 12/30/2022  6:46 PM      Failed - This refill cannot be delegated      Passed - Valid encounter within last 6 months    Recent Outpatient Visits           2 weeks ago Type 2 diabetes mellitus with hyperglycemia, with long-term current use of insulin [facility])   [place] [facility] Storm Frisk, MD   5 months ago Type 2 diabetes mellitus with hyperglycemia, with long-term current use of insulin [facility])   [place] [facility] Storm Frisk, MD   10 months ago Type 2 diabetes mellitus with hyperglycemia, with long-term current use of insulin [facility])   [place] [facility] Storm Frisk, MD   1 year ago Type 2 diabetes mellitus with hyperglycemia, with long-term current use of insulin [facility])   [place] [facility] Storm Frisk, MD   1 year ago Type 2 diabetes mellitus with hyperglycemia, with long-term current use of insulin [facility])   [place] [facility] Storm Frisk, MD       Future Appointments             In 3 weeks Storm Frisk, MD [facility]

## 2023-01-03 ENCOUNTER — Encounter: Payer: MEDICAID | Admitting: Dietician

## 2023-01-03 DIAGNOSIS — E118 Type 2 diabetes mellitus with unspecified complications: Secondary | ICD-10-CM | POA: Diagnosis not present

## 2023-01-03 NOTE — Progress Notes (Signed)
Diabetes Self-Management Education  Visit Type: First/Initial  Appt. Start Time: 1410 Appt. End Time: 1510  01/06/2023  [name], identified by name and date of birth, is a 52 y.o. female with a diagnosis of Diabetes: Type 2.   ASSESSMENT Patient is here today alone.  She has now had 2 Dexcom Sensors that have come off.  Reapplied a new sensor using skin tac and provided her with a sample.  She has been using her air fryer and no longer fries. She states that she tries to be more aware of emotional eating but does eat in her bedroom and states that she eats out of boredom or oral fixation frequently at night. She wishes to start the Omnip  Medical Hx: Type 2 Diabetes, HTN, hypothyroidism, bipolar disorder, (PCOS per patient) Medications: see list to include:  Basaglar, humalog, farxiga, lithium, lurasidone, synthroid, MVI, vitamin D Labs: A1C 8% 07/12/2022    Diabetes Self-Management Education - 01/03/23 1452       Visit Information   Visit Type First/Initial      Initial Visit   Diabetes Type Type 2      Pre-Education Assessment   Patient understands the diabetes disease and treatment process. Comprehends key points    Patient understands incorporating nutritional management into lifestyle. Needs Review    Patient undertands incorporating physical activity into lifestyle. Comprehends key points    Patient understands using medications safely. Comprehends key points    Patient understands monitoring blood glucose, interpreting and using results Comprehends key points    Patient understands prevention, detection, and treatment of acute complications. Comprehends key points    Patient understands prevention, detection, and treatment of chronic complications. Compreheands key points    Patient understands how to develop strategies to address psychosocial issues. Comprehends key points    Patient understands how to develop strategies to promote health/change  behavior. Needs Review      Complications   Last HgB A1C per patient/outside source 8 %   07/12/22     Dietary Intake   Breakfast honey nut cheerios or raisin bran, 1% milk OR grits, sausage, cheese OR low sugar oatmeal, 1% milk, butter    Lunch bacon, tomato, cheese on [employer] OR salad with ham and catalina and loaded potato soup    Dinner chicken and shrimp on rice with cream of mushroom soup, mushrooms, and cheese    Snack (evening) zero sugar ice cream sandwich, chips, and strawberry yogurt    Beverage(s) water, coke zero, cold brew (sweetened) with milk      Patient Education   Previous Diabetes Education Yes (please comment)   multiple times   Healthy Eating Carbohydrate counting    Monitoring Taught/evaluated CGM (comment)   problem solving for CGM     Individualized Goals (developed by patient)   Nutrition Carb counting      Post-Education Assessment   Patient understands the diabetes disease and treatment process. Comprehends key points    Patient understands incorporating nutritional management into lifestyle. Comprehends key points    Patient undertands incorporating physical activity into lifestyle. Comprehends key points    Patient understands using medications safely. Comphrehends key points    Patient understands monitoring blood glucose, interpreting and using results Comprehends key points    Patient understands prevention, detection, and treatment of acute complications. Comprehends key points    Patient understands prevention, detection, and treatment of chronic complications. Comprehends key points    Patient understands how to develop strategies to  address psychosocial issues. Comprehends key points    Patient understands how to develop strategies to promote health/change behavior. Comprehends key points      Outcomes   Expected Outcomes Demonstrated interest in learning. Expect positive outcomes    Future DMSE 2 months    Program Status Not Completed              Individualized Plan for Diabetes Self-Management Training:   Learning Objective:  Patient will have a greater understanding of diabetes self-management. Patient education plan is to attend individual and/or group sessions per assessed needs and concerns.   Plan:   Patient Instructions  Consider food free zones.  Eat in the kitchen/dining room.  Expected Outcomes:  Demonstrated interest in learning. Expect positive outcomes  Education material provided: Food label handouts and Meal plan card, Insulin pump packet  If problems or questions, patient to contact team via:  Phone  Future DSME appointment: 2 months

## 2023-01-03 NOTE — Patient Instructions (Signed)
Consider food free zones.  Eat in the kitchen/dining room.

## 2023-01-06 ENCOUNTER — Encounter: Payer: Self-pay | Admitting: Dietician

## 2023-01-13 ENCOUNTER — Other Ambulatory Visit: Payer: Self-pay | Admitting: Internal Medicine

## 2023-01-13 ENCOUNTER — Telehealth: Payer: Self-pay | Admitting: Dietician

## 2023-01-13 MED ORDER — INSULIN LISPRO 100 UNIT/ML IJ SOLN: INTRAMUSCULAR | 2 refills | Status: DC

## 2023-01-13 NOTE — Telephone Encounter (Signed)
Called patient and made an Omnipod 5 training appointment with LInda for Tuesday 01/17/23.  Medications include:   Lantus:  30 units daily Humalog:  (BG- 130/20) and has been taking 20 units this week as its her mother's birthday but usually 10-15 units before meals. Carb counting:  yes  Instructed her to not take the Lantus the day before the training. Humalog in a vial prescription requested from MD. Insulin pump order form put on MD's desk.  Oran Rein, RD, LDN, CDCES

## 2023-01-17 ENCOUNTER — Ambulatory Visit: Payer: MEDICAID | Admitting: Nutrition

## 2023-01-18 ENCOUNTER — Ambulatory Visit: Payer: MEDICAID | Admitting: Nutrition
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
## 2023-01-24 ENCOUNTER — Encounter: Payer: Self-pay | Admitting: Critical Care Medicine

## 2023-01-24 ENCOUNTER — Ambulatory Visit: Payer: MEDICAID | Attending: Critical Care Medicine | Admitting: Critical Care Medicine

## 2023-01-24 VITALS — BP 128/75 | HR 71 | Wt 264.6 lb

## 2023-01-24 DIAGNOSIS — E282 Polycystic ovarian syndrome: Secondary | ICD-10-CM

## 2023-01-24 DIAGNOSIS — E890 Postprocedural hypothyroidism: Secondary | ICD-10-CM | POA: Diagnosis not present

## 2023-01-24 DIAGNOSIS — E1169 Type 2 diabetes mellitus with other specified complication: Secondary | ICD-10-CM | POA: Diagnosis not present

## 2023-01-24 DIAGNOSIS — G8929 Other chronic pain: Secondary | ICD-10-CM

## 2023-01-24 DIAGNOSIS — E669 Obesity, unspecified: Secondary | ICD-10-CM

## 2023-01-24 DIAGNOSIS — Z794 Long term (current) use of insulin: Secondary | ICD-10-CM

## 2023-01-24 DIAGNOSIS — E1165 Type 2 diabetes mellitus with hyperglycemia: Secondary | ICD-10-CM

## 2023-01-24 DIAGNOSIS — I10 Essential (primary) hypertension: Secondary | ICD-10-CM

## 2023-01-24 DIAGNOSIS — M545 Low back pain, unspecified: Secondary | ICD-10-CM

## 2023-01-24 DIAGNOSIS — Z7984 Long term (current) use of oral hypoglycemic drugs: Secondary | ICD-10-CM

## 2023-01-24 DIAGNOSIS — F515 Nightmare disorder: Secondary | ICD-10-CM

## 2023-01-24 DIAGNOSIS — F411 Generalized anxiety disorder: Secondary | ICD-10-CM

## 2023-01-24 DIAGNOSIS — F3162 Bipolar disorder, current episode mixed, moderate: Secondary | ICD-10-CM

## 2023-01-24 DIAGNOSIS — Z6841 Body Mass Index (BMI) 40.0 and over, adult: Secondary | ICD-10-CM

## 2023-01-24 DIAGNOSIS — R0789 Other chest pain: Secondary | ICD-10-CM

## 2023-01-24 DIAGNOSIS — G43909 Migraine, unspecified, not intractable, without status migrainosus: Secondary | ICD-10-CM

## 2023-01-24 MED ORDER — PRAZOSIN HCL 1 MG PO CAPS: ORAL_CAPSULE | ORAL | 1 refills | Status: DC

## 2023-01-24 MED ORDER — LEVOTHYROXINE SODIUM 150 MCG PO TABS: 150.0000 ug | ORAL_TABLET | Freq: Every day | ORAL | 2 refills | Status: DC

## 2023-01-24 MED ORDER — ATORVASTATIN CALCIUM 10 MG PO TABS
ORAL_TABLET | ORAL | 1 refills | Status: DC
Start: 2023-01-24 — End: 2023-02-03

## 2023-01-24 MED ORDER — OMEPRAZOLE 40 MG PO CPDR
40.0000 mg | DELAYED_RELEASE_CAPSULE | Freq: Every day | ORAL | 0 refills | Status: DC
Start: 2023-01-24 — End: 2023-02-03

## 2023-01-24 MED ORDER — HYDROXYZINE HCL 25 MG PO TABS
25.0000 mg | ORAL_TABLET | Freq: Three times a day (TID) | ORAL | 0 refills | Status: DC
Start: 2023-01-24 — End: 2023-06-29

## 2023-01-24 MED ORDER — CLINDAMYCIN PHOS-BENZOYL PEROX 1.2-5 % EX GEL: 1.0000 | Freq: Two times a day (BID) | CUTANEOUS | 0 refills | Status: DC

## 2023-01-24 MED ORDER — LITHIUM CARBONATE ER 450 MG PO TBCR
EXTENDED_RELEASE_TABLET | ORAL | 1 refills | Status: DC
Start: 2023-01-24 — End: 2023-06-29

## 2023-01-24 MED ORDER — CITALOPRAM HYDROBROMIDE 40 MG PO TABS
40.0000 mg | ORAL_TABLET | Freq: Every day | ORAL | 2 refills | Status: DC
Start: 2023-01-24 — End: 2023-04-28

## 2023-01-24 MED ORDER — GABAPENTIN 300 MG PO CAPS
600.0000 mg | ORAL_CAPSULE | Freq: Two times a day (BID) | ORAL | 3 refills | Status: DC
Start: 2023-01-24 — End: 2023-04-11

## 2023-01-24 MED ORDER — LURASIDONE HCL 120 MG PO TABS
120.0000 mg | ORAL_TABLET | Freq: Every day | ORAL | 2 refills | Status: DC
Start: 2023-01-24 — End: 2023-06-29

## 2023-01-24 MED ORDER — AZITHROMYCIN 250 MG PO TABS: ORAL_TABLET | ORAL | 0 refills | Status: DC

## 2023-01-24 MED ORDER — PROPRANOLOL HCL 40 MG PO TABS
40.0000 mg | ORAL_TABLET | Freq: Two times a day (BID) | ORAL | Status: DC
Start: 2023-01-24 — End: 2023-02-03

## 2023-01-24 MED ORDER — DAPAGLIFLOZIN PROPANEDIOL 10 MG PO TABS: ORAL_TABLET | ORAL | 11 refills | Status: DC

## 2023-01-24 MED ORDER — VITAMIN D (ERGOCALCIFEROL) 1.25 MG (50000 UNIT) PO CAPS: 50000.0000 [IU] | ORAL_CAPSULE | ORAL | 2 refills | Status: DC

## 2023-01-24 NOTE — Assessment & Plan Note (Signed)
Continue with atorvastatin 

## 2023-01-24 NOTE — Patient Instructions (Addendum)
Placed in [facility] [address] [place], [place] [address] PH# [phone]  colonoscopy  Call GYN to see where you are on the list  Medication refills   Start Azithromycin daily as prescribed  Return 4 months new PCP

## 2023-01-24 NOTE — Assessment & Plan Note (Signed)
Currently uncontrolled continue with short acting insulin and she is about to get insulin pump per endocrinology she will follow-up with endocrinology on this

## 2023-01-24 NOTE — Assessment & Plan Note (Signed)
Hypertension well-controlled continue with propranolol and Minipress which she is getting for mental health will help blood pressure

## 2023-01-24 NOTE — Assessment & Plan Note (Signed)
Continue with thyroid supplement

## 2023-01-24 NOTE — Assessment & Plan Note (Signed)
Still awaiting an appointment with gynecology given number to follow-up on this she is on the work queue

## 2023-01-25 ENCOUNTER — Ambulatory Visit: Payer: MEDICAID | Admitting: Nutrition

## 2023-01-31 ENCOUNTER — Encounter: Payer: MEDICAID | Attending: Internal Medicine | Admitting: Nutrition

## 2023-01-31 DIAGNOSIS — E118 Type 2 diabetes mellitus with unspecified complications: Secondary | ICD-10-CM | POA: Insufficient documentation

## 2023-01-31 NOTE — Patient Instructions (Signed)
Need to upgrade her phone to support G6 or G7 app Need to upgrade the PDM-call [name] for help with this.  See card given with phone number Need to exchange the Pods so that they say "for use with G7 sensors.

## 2023-01-31 NOTE — Progress Notes (Signed)
Patient came in with PDM starter kit with expired pods and the G7 sensors.  Her PDM only supports the G6 sensors, but her phone does not support either the G6 or G7 sensor app.  She will upgrade her phone, and or find someone with an old phone that can support either of those apps.  List of available phones given to her.  She is also going the the pharmacy to exchange the expired pods and see if she can exchange the G6 pods for the G7 pods. PDM was set with settings per Dr. Harvel Ricks orders:  Basal rate: 1.2u/hr, ISF: 20, I/C: 10, target: 120 with correction over 120, max bolus 30, Max basal: 2.4u/hr.  Telephone number given for help with upgrading her PDM to use the G7 sensors.  Will need to check pods at home to see if they say "for use with G7 sensors.  If not, call Call [facility] to see if they have those in stock.

## 2023-02-03 ENCOUNTER — Other Ambulatory Visit: Payer: Self-pay | Admitting: Critical Care Medicine

## 2023-02-03 DIAGNOSIS — R0789 Other chest pain: Secondary | ICD-10-CM

## 2023-02-03 DIAGNOSIS — G8929 Other chronic pain: Secondary | ICD-10-CM

## 2023-02-03 DIAGNOSIS — G43909 Migraine, unspecified, not intractable, without status migrainosus: Secondary | ICD-10-CM

## 2023-02-03 DIAGNOSIS — E785 Hyperlipidemia, unspecified: Secondary | ICD-10-CM

## 2023-02-07 ENCOUNTER — Other Ambulatory Visit (HOSPITAL_COMMUNITY): Payer: Self-pay

## 2023-02-07 ENCOUNTER — Telehealth: Payer: Self-pay

## 2023-02-07 ENCOUNTER — Ambulatory Visit (INDEPENDENT_AMBULATORY_CARE_PROVIDER_SITE_OTHER): Payer: MEDICAID | Admitting: Internal Medicine

## 2023-02-07 ENCOUNTER — Encounter: Payer: Self-pay | Admitting: Internal Medicine

## 2023-02-07 VITALS — BP 124/86 | HR 60 | Ht 67.0 in | Wt 259.0 lb

## 2023-02-07 DIAGNOSIS — E1165 Type 2 diabetes mellitus with hyperglycemia: Secondary | ICD-10-CM | POA: Diagnosis not present

## 2023-02-07 DIAGNOSIS — Z794 Long term (current) use of insulin: Secondary | ICD-10-CM | POA: Diagnosis not present

## 2023-02-07 DIAGNOSIS — E890 Postprocedural hypothyroidism: Secondary | ICD-10-CM

## 2023-02-07 DIAGNOSIS — Z7984 Long term (current) use of oral hypoglycemic drugs: Secondary | ICD-10-CM | POA: Diagnosis not present

## 2023-02-07 LAB — POCT GLUCOSE (DEVICE FOR HOME USE): Glucose Fasting, POC: 179 mg/dL — AB (ref 70–99)

## 2023-02-07 LAB — TSH: TSH: 2.24 u[IU]/mL (ref 0.35–5.50)

## 2023-02-07 MED ORDER — LEVOTHYROXINE SODIUM 150 MCG PO TABS: 150.0000 ug | ORAL_TABLET | Freq: Every day | ORAL | 3 refills | Status: DC

## 2023-02-07 MED ORDER — OMNIPOD 5 G7 INTRO (GEN 5) KIT: 1.0000 | PACK | 0 refills | Status: DC

## 2023-02-07 MED ORDER — SEMAGLUTIDE(0.25 OR 0.5MG/DOS) 2 MG/3ML ~~LOC~~ SOPN: 0.5000 mg | PEN_INJECTOR | SUBCUTANEOUS | 3 refills | Status: DC

## 2023-02-07 MED ORDER — OMNIPOD 5 G7 PODS (GEN 5) MISC: 1.0000 | 3 refills | Status: DC
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
## 2023-02-07 NOTE — Telephone Encounter (Signed)
Pharmacy Patient Advocate Encounter   Received notification from CoverMyMeds that prior authorization for Ozempic is required/requested.   Insurance verification completed.   The patient is insured through [facility] .   Per test claim: PA required; PA started via CoverMyMeds. KEY [id number] . Waiting for clinical questions to populate.

## 2023-02-07 NOTE — Patient Instructions (Addendum)
Start Ozempic 0.25 mg once weekly for 6 weeks, then increase to 0.5 mg weekly Continue Farxiga 10 mg daily  Humalog 16 units with a meal  Humalog correctional insulin: Use the scale below to help guide you before each meal   Blood sugar before meal Number of units to inject  Less than 150 0 unit  151 -  170 1 units  171 -  190 2 units  191 -  210 3 units  211 -  230 4 units  231 -  250 5 units  251 -  270 6 units  271 -  290 7 units  291 -  310 8 units       HOW TO TREAT LOW BLOOD SUGARS (Blood sugar LESS THAN 70 MG/DL) Please follow the RULE OF 15 for the treatment of hypoglycemia treatment (when your (blood sugars are less than 70 mg/dL)   STEP 1: Take 15 grams of carbohydrates when your blood sugar is low, which includes:  3-4 GLUCOSE TABS  OR 3-4 OZ OF JUICE OR REGULAR SODA OR ONE TUBE OF GLUCOSE GEL    STEP 2: RECHECK blood sugar in 15 MINUTES STEP 3: If your blood sugar is still low at the 15 minute recheck --> then, go back to STEP 1 and treat AGAIN with another 15 grams of carbohydrates.;

## 2023-02-08 NOTE — Telephone Encounter (Signed)
Clinical info has been submitted with labs and chart notes

## 2023-02-09 ENCOUNTER — Other Ambulatory Visit: Payer: Self-pay | Admitting: Critical Care Medicine

## 2023-02-14 NOTE — Telephone Encounter (Signed)
Pharmacy Patient Advocate Encounter  Received notification from [facility] that Prior Authorization for Ozempic has been APPROVED through 02/08/2024

## 2023-02-16 ENCOUNTER — Other Ambulatory Visit: Payer: Self-pay | Admitting: Critical Care Medicine

## 2023-02-16 DIAGNOSIS — F515 Nightmare disorder: Secondary | ICD-10-CM

## 2023-02-22 ENCOUNTER — Ambulatory Visit: Payer: MEDICAID | Admitting: Nutrition

## 2023-03-16 ENCOUNTER — Other Ambulatory Visit: Payer: Self-pay | Admitting: Critical Care Medicine

## 2023-03-16 DIAGNOSIS — G43909 Migraine, unspecified, not intractable, without status migrainosus: Secondary | ICD-10-CM

## 2023-03-28 ENCOUNTER — Ambulatory Visit: Payer: MEDICAID | Admitting: Internal Medicine

## 2023-04-10 ENCOUNTER — Other Ambulatory Visit: Payer: Self-pay | Admitting: Critical Care Medicine

## 2023-04-10 DIAGNOSIS — M545 Low back pain, unspecified: Secondary | ICD-10-CM

## 2023-04-11 NOTE — Telephone Encounter (Signed)
Requested Prescriptions  Pending Prescriptions Disp Refills   gabapentin (NEURONTIN) 300 MG capsule [Pharmacy Med Name: GABAPENTIN 300 MG CAPS 300 Capsule] 120 capsule 3    Sig: TAKE 2 CAPSULES (600 MG TOTAL) BY MOUTH 2 (TWO) TIMES DAILY.     Neurology: Anticonvulsants - gabapentin Passed - 04/10/2023  9:22 PM      Passed - Cr in normal range and within 360 days    Creatinine, Ser  Date Value Ref Range Status  12/13/2022 0.79 0.57 - 1.00 mg/dL Final   Creatinine,U  Date Value Ref Range Status  05/20/2021 81.0 mg/dL Final         Passed - Completed PHQ-2 or PHQ-9 in the last 360 days      Passed - Valid encounter within last 12 months    Recent Outpatient Visits           2 months ago Primary hypertension   [place] [facility] Storm Frisk, MD   3 months ago Type 2 diabetes mellitus with hyperglycemia, with long-term current use of insulin [facility])   [place] [facility] Storm Frisk, MD   9 months ago Type 2 diabetes mellitus with hyperglycemia, with long-term current use of insulin [facility])   [place] [facility] Storm Frisk, MD   1 year ago Type 2 diabetes mellitus with hyperglycemia, with long-term current use of insulin [facility])   [place] [place] [facility] Storm Frisk, MD   1 year ago Type 2 diabetes mellitus with hyperglycemia, with long-term current use of insulin [facility])   [place] [facility] & [facility] Storm Frisk, MD       Future Appointments             In 1 month Hoy Register, MD [facility]

## 2023-04-28 ENCOUNTER — Other Ambulatory Visit: Payer: Self-pay | Admitting: Critical Care Medicine

## 2023-04-28 DIAGNOSIS — F411 Generalized anxiety disorder: Secondary | ICD-10-CM

## 2023-04-28 DIAGNOSIS — F3162 Bipolar disorder, current episode mixed, moderate: Secondary | ICD-10-CM

## 2023-04-28 NOTE — Telephone Encounter (Signed)
Medication Refill -  Most Recent Primary Care Visit:  Provider: Storm Frisk  Department: [facility]  Visit Type: OFFICE VISIT  Date: 01/24/2023  Medication: citalopram (CELEXA) 40 MG tablet [[id number]]   Has the patient contacted their pharmacy? Yes   (Agent: If yes, when and what did the pharmacy advise?) Pharmacy contacted for refill   Is this the correct pharmacy for this prescription? Yes  This is the patient's preferred pharmacy:    [facility] [place] - [address] [place] [address] Phone: [phone] Fax: [phone]     Has the prescription been filled recently? Yes  Is the patient out of the medication? No  Has the patient been seen for an appointment in the last year OR does the patient have an upcoming appointment? Yes  Can we respond through MyChart? No  Agent: Please be advised that Rx refills may take up to 3 business days. We ask that you follow-up with your pharmacy.

## 2023-05-01 MED ORDER — CITALOPRAM HYDROBROMIDE 40 MG PO TABS: 40.0000 mg | ORAL_TABLET | Freq: Every day | ORAL | 2 refills | Status: AC

## 2023-05-01 NOTE — Telephone Encounter (Signed)
Requested Prescriptions  Pending Prescriptions Disp Refills   citalopram (CELEXA) 40 MG tablet 90 tablet 2    Sig: Take 1 tablet (40 mg total) by mouth daily.     Psychiatry:  Antidepressants - SSRI Passed - 04/28/2023  1:24 PM      Passed - Completed PHQ-2 or PHQ-9 in the last 360 days      Passed - Valid encounter within last 6 months    Recent Outpatient Visits           3 months ago Primary hypertension   [place] [facility] [place]. [facility] Storm Frisk, MD   4 months ago Type 2 diabetes mellitus with hyperglycemia, with long-term current use of insulin [facility])   [place] [facility] [place]. [facility] Storm Frisk, MD   9 months ago Type 2 diabetes mellitus with hyperglycemia, with long-term current use of insulin [facility])   [place] [facility] [place]. [facility] Storm Frisk, MD   1 year ago Type 2 diabetes mellitus with hyperglycemia, with long-term current use of insulin [facility])   [place] [facility] - [facility] Dept Of [place]. [facility] Storm Frisk, MD   1 year ago Type 2 diabetes mellitus with hyperglycemia, with long-term current use of insulin [facility])    [facility] - [facility] Of [place]. [facility] Storm Frisk, MD       Future Appointments             In 4 weeks Hoy Register, MD [facility] [place] - A Dept Of [place]. [facility]

## 2023-05-30 ENCOUNTER — Ambulatory Visit: Payer: MEDICAID | Admitting: Family Medicine

## 2023-06-25 NOTE — Progress Notes (Signed)
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 mellitus with hyperglycemia, with long-term current use of insulin (HCC)   Management per endocrinology      Relevant Medications   atorvastatin (LIPITOR) 10 MG tablet   Other Relevant Orders   Microalbumin / creatinine urine ratio   Comprehensive metabolic panel   Hyperlipidemia associated with type 2 diabetes mellitus (HCC)   Assess labs check lipids continue statin      Relevant Medications   prazosin (MINIPRESS) 1 MG capsule   atorvastatin (LIPITOR) 10 MG tablet   Other Relevant Orders   Lipid panel     Nervous and Auditory   Nightmare   Relevant Medications   prazosin (MINIPRESS) 1 MG capsule   hydrOXYzine (ATARAX) 25 MG tablet     Other   Lumbago without sciatica   Relevant Medications   methocarbamol (ROBAXIN) 500 MG tablet   Bipolar 1 disorder, depressed, moderate (HCC)   I refilled all  mental health medicines told the patient in future mental health provider will need to do this lithium level was checked      RESOLVED: Bipolar 1 disorder, mixed, moderate (HCC)   Relevant Medications   Lurasidone HCl 120 MG TABS   hydrOXYzine (ATARAX) 25 MG tablet   lithium carbonate (ESKALITH) 450 MG ER tablet   Other Visit Diagnoses       Encounter  for screening involving social determinants of health [facility])    -  Primary   Relevant Orders   AMB Referral VBCI Care Management     Encounter for therapeutic drug monitoring       Relevant Orders   Lithium level        Meds ordered this encounter  Medications   Vitamin D, Ergocalciferol, (DRISDOL) 1.25 MG (50000 UNIT) CAPS capsule    Sig: Take 1 capsule (50,000 Units total) by mouth every Saturday.    Dispense:  12 capsule    Refill:  2    Please keep upcoming appointment for more refills. We need to get an updated vitD(OH) level   prazosin (MINIPRESS) 1 MG capsule    Sig: TAKE 1 CAPSULE AT BEDTIME FOR SLEEP/NIGHTMARES    Dispense:  180 capsule    Refill:  1   Lurasidone HCl 120 MG TABS    Sig: Take 1 tablet (120 mg total) by mouth at bedtime.    Dispense:  30 tablet    Refill:  2   hydrOXYzine (ATARAX) 25 MG tablet    Sig: Take 1 tablet (25 mg total) by mouth 3 (three) times daily.    Dispense:  90 tablet    Refill:  0   methocarbamol (ROBAXIN) 500 MG tablet    Sig: Take 1 tablet (500 mg total) by mouth every 6 (six) hours as needed for muscle spasms.    Dispense:  270 tablet    Refill:  1   atorvastatin (LIPITOR) 10 MG tablet    Sig: TAKE 1 TABLET BY MOUTH EVERY DAY AT BEDTIME    Dispense:  90 tablet    Refill:  1   lithium carbonate (ESKALITH) 450 MG ER tablet    Sig: TAKE 2 TABLETS BY MOUTH AT BEDTIME    Dispense:  120 tablet    Refill:  1   Zoster Vaccine Adjuvanted [facility]) injection    Sig: Inject 0.5 mLs into the muscle once for 1 dose.    Dispense:  0.5 mL    Refill:  0   Follow-up: Return in about 6  months (around 12/27/2023) for chronic conditions, primary care follow up.    Shan Levans, MD

## 2023-06-27 ENCOUNTER — Ambulatory Visit (INDEPENDENT_AMBULATORY_CARE_PROVIDER_SITE_OTHER): Payer: MEDICAID | Admitting: Internal Medicine

## 2023-06-27 ENCOUNTER — Encounter: Payer: Self-pay | Admitting: Internal Medicine

## 2023-06-27 VITALS — BP 120/84 | HR 80 | Ht 67.0 in | Wt 262.0 lb

## 2023-06-27 DIAGNOSIS — E890 Postprocedural hypothyroidism: Secondary | ICD-10-CM

## 2023-06-27 DIAGNOSIS — Z794 Long term (current) use of insulin: Secondary | ICD-10-CM | POA: Diagnosis not present

## 2023-06-27 DIAGNOSIS — E1165 Type 2 diabetes mellitus with hyperglycemia: Secondary | ICD-10-CM

## 2023-06-27 LAB — POCT GLYCOSYLATED HEMOGLOBIN (HGB A1C): Hemoglobin A1C: 7.5 % — AB (ref 4.0–5.6)

## 2023-06-27 LAB — POCT GLUCOSE (DEVICE FOR HOME USE): Glucose Fasting, POC: 137 mg/dL — AB (ref 70–99)

## 2023-06-27 MED ORDER — SEMAGLUTIDE (1 MG/DOSE) 4 MG/3ML ~~LOC~~ SOPN: 1.0000 mg | PEN_INJECTOR | SUBCUTANEOUS | 3 refills | Status: DC

## 2023-06-27 MED ORDER — BD PEN NEEDLE MICRO U/F 32G X 6 MM MISC: 1.0000 | Freq: Four times a day (QID) | 3 refills | Status: DC

## 2023-06-27 MED ORDER — LEVOTHYROXINE SODIUM 150 MCG PO TABS: 150.0000 ug | ORAL_TABLET | Freq: Every day | ORAL | 3 refills | Status: DC

## 2023-06-27 MED ORDER — DAPAGLIFLOZIN PROPANEDIOL 10 MG PO TABS: 10.0000 mg | ORAL_TABLET | Freq: Every day | ORAL | 3 refills | Status: DC

## 2023-06-27 MED ORDER — INSULIN LISPRO 100 UNIT/ML IJ SOLN: INTRAMUSCULAR | 2 refills | Status: DC
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
## 2023-06-28 ENCOUNTER — Other Ambulatory Visit: Payer: Self-pay | Admitting: Nutrition

## 2023-06-29 ENCOUNTER — Encounter: Payer: Self-pay | Admitting: Critical Care Medicine

## 2023-06-29 ENCOUNTER — Ambulatory Visit: Payer: MEDICAID | Attending: Critical Care Medicine | Admitting: Critical Care Medicine

## 2023-06-29 VITALS — BP 135/88 | HR 78 | Wt 262.8 lb

## 2023-06-29 DIAGNOSIS — E1169 Type 2 diabetes mellitus with other specified complication: Secondary | ICD-10-CM | POA: Diagnosis not present

## 2023-06-29 DIAGNOSIS — F515 Nightmare disorder: Secondary | ICD-10-CM

## 2023-06-29 DIAGNOSIS — Z23 Encounter for immunization: Secondary | ICD-10-CM | POA: Diagnosis not present

## 2023-06-29 DIAGNOSIS — M545 Low back pain, unspecified: Secondary | ICD-10-CM

## 2023-06-29 DIAGNOSIS — I10 Essential (primary) hypertension: Secondary | ICD-10-CM

## 2023-06-29 DIAGNOSIS — F3132 Bipolar disorder, current episode depressed, moderate: Secondary | ICD-10-CM

## 2023-06-29 DIAGNOSIS — I739 Peripheral vascular disease, unspecified: Secondary | ICD-10-CM | POA: Diagnosis not present

## 2023-06-29 DIAGNOSIS — Z7984 Long term (current) use of oral hypoglycemic drugs: Secondary | ICD-10-CM

## 2023-06-29 DIAGNOSIS — Z5181 Encounter for therapeutic drug level monitoring: Secondary | ICD-10-CM

## 2023-06-29 DIAGNOSIS — E1165 Type 2 diabetes mellitus with hyperglycemia: Secondary | ICD-10-CM | POA: Diagnosis not present

## 2023-06-29 DIAGNOSIS — Z7985 Long-term (current) use of injectable non-insulin antidiabetic drugs: Secondary | ICD-10-CM

## 2023-06-29 DIAGNOSIS — E119 Type 2 diabetes mellitus without complications: Secondary | ICD-10-CM

## 2023-06-29 DIAGNOSIS — Z139 Encounter for screening, unspecified: Secondary | ICD-10-CM

## 2023-06-29 DIAGNOSIS — E890 Postprocedural hypothyroidism: Secondary | ICD-10-CM

## 2023-06-29 DIAGNOSIS — E785 Hyperlipidemia, unspecified: Secondary | ICD-10-CM

## 2023-06-29 DIAGNOSIS — F3162 Bipolar disorder, current episode mixed, moderate: Secondary | ICD-10-CM

## 2023-06-29 DIAGNOSIS — Z794 Long term (current) use of insulin: Secondary | ICD-10-CM

## 2023-06-29 MED ORDER — LITHIUM CARBONATE ER 450 MG PO TBCR: EXTENDED_RELEASE_TABLET | ORAL | 1 refills | Status: AC

## 2023-06-29 MED ORDER — PRAZOSIN HCL 1 MG PO CAPS: ORAL_CAPSULE | ORAL | 1 refills | Status: AC

## 2023-06-29 MED ORDER — METHOCARBAMOL 500 MG PO TABS: 500.0000 mg | ORAL_TABLET | Freq: Four times a day (QID) | ORAL | 1 refills | Status: AC | PRN

## 2023-06-29 MED ORDER — VITAMIN D (ERGOCALCIFEROL) 1.25 MG (50000 UNIT) PO CAPS: 50000.0000 [IU] | ORAL_CAPSULE | ORAL | 2 refills | Status: DC

## 2023-06-29 MED ORDER — HYDROXYZINE HCL 25 MG PO TABS: 25.0000 mg | ORAL_TABLET | Freq: Three times a day (TID) | ORAL | 0 refills | Status: DC

## 2023-06-29 MED ORDER — LURASIDONE HCL 120 MG PO TABS: 120.0000 mg | ORAL_TABLET | Freq: Every day | ORAL | 2 refills | Status: AC

## 2023-06-29 MED ORDER — ATORVASTATIN CALCIUM 10 MG PO TABS: ORAL_TABLET | ORAL | 1 refills | Status: DC

## 2023-06-29 MED ORDER — ZOSTER VAC RECOMB ADJUVANTED 50 MCG/0.5ML IM SUSR: 0.5000 mL | Freq: Once | INTRAMUSCULAR | 0 refills | Status: AC

## 2023-06-29 NOTE — Patient Instructions (Signed)
Labs today Medication refills Go to [facility] for eye exam Shingles vaccine downstairs at pharmacy Call Dr Lily Peer office re ovary egg donation  [phone] Get foot exam Lithium levels done , get mental health MD to own future refills on mental health medications Return 6 months new primary care

## 2023-06-30 ENCOUNTER — Ambulatory Visit: Payer: MEDICAID | Admitting: Internal Medicine

## 2023-06-30 ENCOUNTER — Encounter: Payer: Self-pay | Admitting: Critical Care Medicine

## 2023-06-30 NOTE — Assessment & Plan Note (Signed)
Assess labs check lipids continue statin

## 2023-06-30 NOTE — Assessment & Plan Note (Signed)
At goal no change in medications refills given check labs

## 2023-06-30 NOTE — Assessment & Plan Note (Signed)
I refilled all mental health medicines told the patient in future mental health provider will need to do this lithium level was checked

## 2023-06-30 NOTE — Assessment & Plan Note (Signed)
Management per endocrinology.

## 2023-06-30 NOTE — Assessment & Plan Note (Signed)
Check lipids 

## 2023-06-30 NOTE — Assessment & Plan Note (Signed)
At goal continue with thyroid supplement

## 2023-07-02 LAB — CBC WITH DIFFERENTIAL/PLATELET
Basophils Absolute: 0.1 10*3/uL (ref 0.0–0.2)
Basos: 1 %
EOS (ABSOLUTE): 0.3 10*3/uL (ref 0.0–0.4)
Eos: 3 %
Hematocrit: 41.4 % (ref 34.0–46.6)
Hemoglobin: 13 g/dL (ref 11.1–15.9)
Immature Grans (Abs): 0 10*3/uL (ref 0.0–0.1)
Immature Granulocytes: 0 %
Lymphocytes Absolute: 2.6 10*3/uL (ref 0.7–3.1)
Lymphs: 28 %
MCH: 27.1 pg (ref 26.6–33.0)
MCHC: 31.4 g/dL — ABNORMAL LOW (ref 31.5–35.7)
MCV: 86 fL (ref 79–97)
Monocytes Absolute: 0.5 10*3/uL (ref 0.1–0.9)
Monocytes: 6 %
Neutrophils Absolute: 5.8 10*3/uL (ref 1.4–7.0)
Neutrophils: 62 %
Platelets: 496 10*3/uL — ABNORMAL HIGH (ref 150–450)
RBC: 4.79 x10E6/uL (ref 3.77–5.28)
RDW: 13.6 % (ref 11.7–15.4)
WBC: 9.3 10*3/uL (ref 3.4–10.8)

## 2023-07-02 LAB — LIPID PANEL
Chol/HDL Ratio: 2.6 {ratio} (ref 0.0–4.4)
Cholesterol, Total: 134 mg/dL (ref 100–199)
HDL: 51 mg/dL (ref >39)
LDL Chol Calc (NIH): 71 mg/dL (ref 0–99)
Triglycerides: 57 mg/dL (ref 0–149)
VLDL Cholesterol Cal: 12 mg/dL (ref 5–40)

## 2023-07-02 LAB — COMPREHENSIVE METABOLIC PANEL
ALT: 12 [IU]/L (ref 0–32)
AST: 10 [IU]/L (ref 0–40)
Albumin: 4.7 g/dL (ref 3.9–4.9)
Alkaline Phosphatase: 69 [IU]/L (ref 44–121)
BUN/Creatinine Ratio: 9 (ref 9–23)
BUN: 8 mg/dL (ref 6–24)
Bilirubin Total: 0.2 mg/dL (ref 0.0–1.2)
CO2: 23 mmol/L (ref 20–29)
Calcium: 9.5 mg/dL (ref 8.7–10.2)
Chloride: 104 mmol/L (ref 96–106)
Creatinine, Ser: 0.89 mg/dL (ref 0.57–1.00)
Globulin, Total: 2.6 g/dL (ref 1.5–4.5)
Glucose: 107 mg/dL — ABNORMAL HIGH (ref 70–99)
Potassium: 4.7 mmol/L (ref 3.5–5.2)
Sodium: 144 mmol/L (ref 134–144)
Total Protein: 7.3 g/dL (ref 6.0–8.5)
eGFR: 79 mL/min/{1.73_m2} (ref >59)

## 2023-07-02 LAB — MICROALBUMIN / CREATININE URINE RATIO
Creatinine, Urine: 171.4 mg/dL
Microalb/Creat Ratio: 6 mg/g{creat} (ref 0–29)
Microalbumin, Urine: 9.9 ug/mL

## 2023-07-02 LAB — LITHIUM LEVEL: Lithium Lvl: 0.5 mmol/L (ref 0.5–1.2)

## 2023-07-03 ENCOUNTER — Encounter: Payer: Self-pay | Admitting: Physician Assistant

## 2023-07-05 ENCOUNTER — Telehealth: Payer: Self-pay

## 2023-07-05 NOTE — Telephone Encounter (Signed)
Pt was called and is aware of results, DOB was confirmed.  ?

## 2023-07-05 NOTE — Telephone Encounter (Signed)
-----   Message from Roney Jaffe sent at 07/03/2023 10:44 AM EST ----- Please call patient and let her know that her kidney function and liver function are within normal limits, she does not show signs of anemia.  Her cholesterol is well-controlled.  Her lithium level is within normal limits as well.

## 2023-07-07 ENCOUNTER — Ambulatory Visit: Payer: MEDICAID | Admitting: Internal Medicine

## 2023-07-07 ENCOUNTER — Other Ambulatory Visit: Payer: Self-pay | Admitting: Critical Care Medicine

## 2023-07-07 DIAGNOSIS — R0789 Other chest pain: Secondary | ICD-10-CM

## 2023-07-07 DIAGNOSIS — G43909 Migraine, unspecified, not intractable, without status migrainosus: Secondary | ICD-10-CM

## 2023-07-10 NOTE — Telephone Encounter (Signed)
Requested by interface surescripts. Future visit in 5 months . Requested Prescriptions  Pending Prescriptions Disp Refills   propranolol (INDERAL) 40 MG tablet [Pharmacy Med Name: PROPRANOLOL 40MG  TAB 40 Tablet] 60 tablet 6    Sig: TAKE 1 TABLET BY MOUTH TWICE DAILY     Cardiovascular:  Beta Blockers Passed - 07/10/2023  8:19 AM      Passed - Last BP in normal range    BP Readings from Last 1 Encounters:  06/29/23 135/88         Passed - Last Heart Rate in normal range    Pulse Readings from Last 1 Encounters:  06/29/23 78         Passed - Valid encounter within last 6 months    Recent Outpatient Visits           1 week ago Type 2 diabetes mellitus with hyperglycemia, with long-term current use of insulin (HCC)   [facility] [place]. [facility] Storm Frisk, MD   5 months ago Primary hypertension   [facility] - [facility] Dept Of [place]. [facility] Storm Frisk, MD   6 months ago Type 2 diabetes mellitus with hyperglycemia, with long-term current use of insulin [facility])   [facility] - A Dept Of [place]. [facility] Storm Frisk, MD   12 months ago Type 2 diabetes mellitus with hyperglycemia, with long-term current use of insulin [facility])   [facility] - A Dept Of [place]. [facility] Storm Frisk, MD   1 year ago Type 2 diabetes mellitus with hyperglycemia, with long-term current use of insulin [facility])   [facility] [facility] - A Dept Of [place]. [facility] Storm Frisk, MD       Future Appointments             In 5 months Hoy Register, MD [facility] - A Dept Of [place]a Bridegroom. [facility]             omeprazole (PRILOSEC) 40 MG capsule [Pharmacy Med Name: OMEPRAZOLE 40 MG CPDR 40 Capsule] 30 capsule 8    Sig: TAKE 1 CAPSULE BY MOUTH DAILY     Gastroenterology: Proton Pump  Inhibitors Passed - 07/10/2023  8:19 AM      Passed - Valid encounter within last 12 months    Recent Outpatient Visits           1 week ago Type 2 diabetes mellitus with hyperglycemia, with long-term current use of insulin (HCC)   [facility] Dept Of [place]. [facility] Storm Frisk, MD   5 months ago Primary hypertension   [facility] Dept Of [place]. [facility] Storm Frisk, MD   6 months ago Type 2 diabetes mellitus with hyperglycemia, with long-term current use of insulin [facility])   [facility] - A Dept Of [place]. [facility] Storm Frisk, MD   12 months ago Type 2 diabetes mellitus with hyperglycemia, with long-term current use of insulin [facility])   [place] [facility] - A Dept Of [place]. [facility] Storm Frisk, MD   1 year ago Type 2 diabetes mellitus with hyperglycemia, with long-term current use of insulin (  HCC)   [facility] - A Dept Of [place]. [facility] Storm Frisk, MD       Future Appointments             In 5 months Hoy Register, MD [facility] [place] - A Dept Of [place] Bridegroom. [facility]

## 2023-07-21 ENCOUNTER — Other Ambulatory Visit: Payer: Self-pay | Admitting: Critical Care Medicine

## 2023-07-21 DIAGNOSIS — M545 Low back pain, unspecified: Secondary | ICD-10-CM

## 2023-07-24 NOTE — Telephone Encounter (Signed)
 Requested medication (s) are due for refill today: Yes  Requested medication (s) are on the active medication list: Yes  Last refill:  06/29/23, #270, 1 refill  Future visit scheduled: Yes  Notes to clinic:  Change in pharmacy request. Unable to refill per protocol, cannot delegate.      Requested Prescriptions  Pending Prescriptions Disp Refills   methocarbamol  (ROBAXIN ) 500 MG tablet [Pharmacy Med Name: METHOCARBAMOL  500 MG TABS 500 Tablet] 90 tablet 10    Sig: TAKE 1 TABLET BY MOUTH 3 TIMES DAILY AS NEEDED FOR MUSCLE SPASMS     Not Delegated - Analgesics:  Muscle Relaxants Failed - 07/24/2023  8:49 AM      Failed - This refill cannot be delegated      Passed - Valid encounter within last 6 months    Recent Outpatient Visits           3 weeks ago Type 2 diabetes mellitus with hyperglycemia, with long-term current use of insulin  (HCC)   [facility] Of [place]. [facility] Vernell Goldsmith, MD   6 months ago Primary hypertension   [facility] Dept Of [place]. [facility] Vernell Goldsmith, MD   7 months ago Type 2 diabetes mellitus with hyperglycemia, with long-term current use of insulin  [facility])   [facility] [facility] - A Dept Of [place]. [facility] Vernell Goldsmith, MD   1 year ago Type 2 diabetes mellitus with hyperglycemia, with long-term current use of insulin  [facility])   [facility] - A Dept Of [place]. [facility] Vernell Goldsmith, MD   1 year ago Type 2 diabetes mellitus with hyperglycemia, with long-term current use of insulin  [facility])    [facility] - A Dept Of [place]. [facility] Vernell Goldsmith, MD       Future Appointments             In 5 months Joaquin Mulberry, MD [facility] [place] - A Dept Of [place]. [facility]

## 2023-08-07 ENCOUNTER — Other Ambulatory Visit: Payer: Self-pay | Admitting: Family Medicine

## 2023-08-07 DIAGNOSIS — M545 Low back pain, unspecified: Secondary | ICD-10-CM

## 2023-09-05 ENCOUNTER — Other Ambulatory Visit: Payer: Self-pay | Admitting: Critical Care Medicine

## 2023-09-05 DIAGNOSIS — E1169 Type 2 diabetes mellitus with other specified complication: Secondary | ICD-10-CM

## 2023-09-17 ENCOUNTER — Other Ambulatory Visit: Payer: Self-pay | Admitting: Critical Care Medicine

## 2023-09-17 DIAGNOSIS — F3162 Bipolar disorder, current episode mixed, moderate: Secondary | ICD-10-CM

## 2023-09-17 DIAGNOSIS — R0789 Other chest pain: Secondary | ICD-10-CM

## 2023-10-31 ENCOUNTER — Ambulatory Visit: Payer: MEDICAID | Admitting: Internal Medicine

## 2023-11-13 ENCOUNTER — Telehealth: Payer: Self-pay | Admitting: Critical Care Medicine

## 2023-11-13 NOTE — Telephone Encounter (Addendum)
 Can the atorvastatin  (LIPITOR) 10 MG be sent to the patient's pharmacy, or will she need to be seen by Dr. Newlin first?  Copied from CRM [id number]. Topic: Clinical - Prescription Issue >> Nov 13, 2023  3:41 PM Alysia Jumbo S wrote:  Reason for CRM: Patient states that her pharmacy informed her that her atorvastatin  (LIPITOR) 10 MG tablet was refused. She states she is no longer seeing DR. Brent Cambric and is to begin care with Dr. Newlin. She is scheduled to see Dr. Newlin on 01/02/2024 and would like to know if she will go ahead and send medication to her pharmacy due to her being out of the medication.  Callback # [phone]  [id number] - [place], [place] - [address] [place] [address] Phone: [phone] Fax: [phone] Hours: Not open 24 hours

## 2023-11-14 ENCOUNTER — Other Ambulatory Visit: Payer: Self-pay

## 2023-11-14 DIAGNOSIS — E1169 Type 2 diabetes mellitus with other specified complication: Secondary | ICD-10-CM

## 2023-11-14 MED ORDER — ATORVASTATIN CALCIUM 10 MG PO TABS: ORAL_TABLET | ORAL | 1 refills | Status: DC

## 2023-11-14 NOTE — Telephone Encounter (Signed)
 Medication has been refilled.

## 2023-12-04 ENCOUNTER — Ambulatory Visit: Payer: MEDICAID | Admitting: Internal Medicine

## 2023-12-05 ENCOUNTER — Other Ambulatory Visit: Payer: Self-pay

## 2023-12-05 ENCOUNTER — Other Ambulatory Visit: Payer: Self-pay | Admitting: Critical Care Medicine

## 2023-12-06 ENCOUNTER — Other Ambulatory Visit: Payer: Self-pay

## 2023-12-07 ENCOUNTER — Other Ambulatory Visit: Payer: Self-pay | Admitting: Critical Care Medicine

## 2023-12-12 ENCOUNTER — Ambulatory Visit: Payer: MEDICAID | Admitting: Internal Medicine

## 2024-01-01 ENCOUNTER — Telehealth: Payer: Self-pay | Admitting: Family Medicine

## 2024-01-01 NOTE — Telephone Encounter (Signed)
 Pt confirmed appt

## 2024-01-02 ENCOUNTER — Encounter: Payer: Self-pay | Admitting: Family Medicine

## 2024-01-02 ENCOUNTER — Ambulatory Visit: Payer: MEDICAID | Attending: Family Medicine | Admitting: Family Medicine

## 2024-01-02 VITALS — BP 110/76 | HR 70 | Ht 67.0 in | Wt 248.6 lb

## 2024-01-02 DIAGNOSIS — F3162 Bipolar disorder, current episode mixed, moderate: Secondary | ICD-10-CM | POA: Diagnosis not present

## 2024-01-02 DIAGNOSIS — Z23 Encounter for immunization: Secondary | ICD-10-CM | POA: Diagnosis not present

## 2024-01-02 DIAGNOSIS — Z1211 Encounter for screening for malignant neoplasm of colon: Secondary | ICD-10-CM

## 2024-01-02 DIAGNOSIS — G43909 Migraine, unspecified, not intractable, without status migrainosus: Secondary | ICD-10-CM

## 2024-01-02 DIAGNOSIS — E785 Hyperlipidemia, unspecified: Secondary | ICD-10-CM | POA: Diagnosis not present

## 2024-01-02 DIAGNOSIS — Z634 Disappearance and death of family member: Secondary | ICD-10-CM | POA: Diagnosis not present

## 2024-01-02 DIAGNOSIS — I10 Essential (primary) hypertension: Secondary | ICD-10-CM

## 2024-01-02 DIAGNOSIS — E890 Postprocedural hypothyroidism: Secondary | ICD-10-CM

## 2024-01-02 DIAGNOSIS — Z7985 Long-term (current) use of injectable non-insulin antidiabetic drugs: Secondary | ICD-10-CM

## 2024-01-02 DIAGNOSIS — E1169 Type 2 diabetes mellitus with other specified complication: Secondary | ICD-10-CM

## 2024-01-02 DIAGNOSIS — E1165 Type 2 diabetes mellitus with hyperglycemia: Secondary | ICD-10-CM

## 2024-01-02 DIAGNOSIS — K2100 Gastro-esophageal reflux disease with esophagitis, without bleeding: Secondary | ICD-10-CM

## 2024-01-02 MED ORDER — PROPRANOLOL HCL 40 MG PO TABS: 40.0000 mg | ORAL_TABLET | Freq: Two times a day (BID) | ORAL | 1 refills | Status: DC

## 2024-01-02 MED ORDER — ATORVASTATIN CALCIUM 10 MG PO TABS: ORAL_TABLET | ORAL | 1 refills | Status: DC

## 2024-01-02 NOTE — Patient Instructions (Signed)
 Managing Loss, Adult  People experience loss in many different ways throughout their lives. Events such as moving, changing jobs, and losing friends can create a sense of loss. The loss may be as serious as a major health change, divorce, death of a pet, or death of a loved one. All of these types of loss are likely to create a physical and emotional reaction known as grief. Grief is the result of a major change or an absence of something or someone that you count on. Grief is a normal reaction to loss.  A variety of factors can affect your grieving experience, including:  The nature of your loss.  Your relationship to what or whom you lost.  Your understanding of grief and how to manage it.  Your support system.  Be aware that when grief becomes extreme, it can lead to more severe issues like isolation, depression, anxiety, or suicidal thoughts. Talk with your health care provider if you have any of these issues.  How to manage lifestyle changes    Keep to your normal routine as much as possible.  If you have trouble focusing or doing normal activities, it is acceptable to take some time away from your normal routine.  Spend time with friends and loved ones.  Eat a healthy diet, get plenty of sleep, and rest when you feel tired.  How to recognize changes   The way that you deal with your grief will affect your ability to function as you normally do. When grieving, you may experience these changes:  Numbness, shock, sadness, anxiety, anger, denial, and guilt.  Thoughts about death.  Unexpected crying.  A physical sensation of emptiness in your stomach.  Problems sleeping and eating.  Tiredness (fatigue).  Loss of interest in normal activities.  Dreaming about or imagining seeing the person who died.  A need to remember what or whom you lost.  Difficulty thinking about anything other than your loss for a period of time.  Relief. If you have been expecting the loss for a while, you may feel a sense of relief when it  happens.  Follow these instructions at home:  Activity    Express your feelings in healthy ways, such as:  Talking with others about your loss. It may be helpful to find others who have had a similar loss, such as a support group.  Writing down your feelings in a journal.  Doing physical activities to release stress and emotional energy.  Doing creative activities like painting, sculpting, or playing or listening to music.  Practicing resilience. This is the ability to recover and adjust after facing challenges. Reading some resources that encourage resilience may help you to learn ways to practice those behaviors.     General instructions  Be patient with yourself and others. Allow the grieving process to happen, and remember that grieving takes time.  It is likely that you may never feel completely done with some grief. You may find a way to move on while still cherishing memories and feelings about your loss.  Accepting your loss is a process. It can take months or longer to adjust.  Keep all follow-up visits. This is important.  Where to find support  To get support for managing loss:  Ask your health care provider for help and recommendations, such as grief counseling or therapy.  Think about joining a support group for people who are managing a loss.  Where to find more information  You can find more information  about managing loss from:  American Society of Clinical Oncology: www.cancer.net  American Psychological Association: [employer]  Contact a health care provider if:  Your grief is extreme and keeps getting worse.  You have ongoing grief that does not improve.  Your body shows symptoms of grief, such as illness.  You feel depressed, anxious, or hopeless.  Get help right away if:  You have thoughts about hurting yourself or others.  Get help right away if you feel like you may hurt yourself or others, or have thoughts about taking your own life. Go to your nearest emergency room or:  Call 911.  Call the  National Suicide Prevention Lifeline at 985-787-5965 or 988. This is open 24 hours a day.  Text the Crisis Text Line at (308)798-1395.  Summary  Grief is the result of a major change or an absence of someone or something that you count on. Grief is a normal reaction to loss.  The depth of grief and the period of recovery depend on the type of loss and your ability to adjust to the change and process your feelings.  Processing grief requires patience and a willingness to accept your feelings and talk about your loss with people who are supportive.  It is important to find resources that work for you and to realize that people experience grief differently. There is not one grieving process that works for everyone in the same way.  Be aware that when grief becomes extreme, it can lead to more severe issues like isolation, depression, anxiety, or suicidal thoughts. Talk with your health care provider if you have any of these issues.  This information is not intended to replace advice given to you by your health care provider. Make sure you discuss any questions you have with your health care provider.  Document Revised: 01/18/2021 Document Reviewed: 01/18/2021  Elsevier Patient Education  2024 [employer].
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
## 2024-01-09 ENCOUNTER — Ambulatory Visit: Payer: MEDICAID | Admitting: Internal Medicine

## 2024-01-16 ENCOUNTER — Other Ambulatory Visit: Payer: Self-pay | Admitting: Critical Care Medicine

## 2024-01-23 ENCOUNTER — Encounter: Payer: Self-pay | Admitting: Internal Medicine

## 2024-01-23 ENCOUNTER — Ambulatory Visit: Payer: MEDICAID | Admitting: Internal Medicine
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
## 2024-01-26 ENCOUNTER — Encounter: Payer: Self-pay | Admitting: Gastroenterology

## 2024-01-29 ENCOUNTER — Telehealth: Payer: Self-pay

## 2024-01-29 ENCOUNTER — Other Ambulatory Visit (HOSPITAL_COMMUNITY): Payer: Self-pay

## 2024-01-29 NOTE — Telephone Encounter (Signed)
 Pharmacy Patient Advocate Encounter   Received notification from CoverMyMeds that prior authorization for Ozempic  (0.25 or 0.5 MG/DOSE) 2MG /3ML pen-injectors is required/requested.   Insurance verification completed.   The patient is insured through [facility] .   Medication has been changed to Ozempic  4mg /45ml. PA not needed at this time

## 2024-02-02 ENCOUNTER — Other Ambulatory Visit: Payer: Self-pay

## 2024-02-02 ENCOUNTER — Encounter: Payer: Self-pay | Admitting: Physical Therapy

## 2024-02-02 ENCOUNTER — Ambulatory Visit: Payer: MEDICAID | Attending: Surgical | Admitting: Physical Therapy

## 2024-02-02 DIAGNOSIS — M25612 Stiffness of left shoulder, not elsewhere classified: Secondary | ICD-10-CM | POA: Diagnosis present

## 2024-02-02 DIAGNOSIS — M25511 Pain in right shoulder: Secondary | ICD-10-CM | POA: Diagnosis present

## 2024-02-02 DIAGNOSIS — M25611 Stiffness of right shoulder, not elsewhere classified: Secondary | ICD-10-CM | POA: Insufficient documentation

## 2024-02-02 DIAGNOSIS — M25512 Pain in left shoulder: Secondary | ICD-10-CM | POA: Diagnosis present

## 2024-02-02 DIAGNOSIS — M6281 Muscle weakness (generalized): Secondary | ICD-10-CM | POA: Diagnosis present

## 2024-02-02 NOTE — Therapy (Signed)
 OUTPATIENT PHYSICAL THERAPY CERVICAL EVALUATION   Patient Name: Lynn Martinez MRN: [id number] DOB:Jul 30, 1970, 53 y.o., female Today's Date: 02/02/2024  END OF SESSION:  PT End of Session - 02/02/24 0928     Visit Number 1    Date for PT Re-Evaluation 03/29/24    Authorization Type [employer] will submit for auth    PT Start Time 0930    PT Stop Time 1013    PT Time Calculation (min) 43 min    Activity Tolerance Patient tolerated treatment well          Past Medical History:  Diagnosis Date   Allergy    Anemia    Anxiety    Arthritis    knees (09/30/2015)   Bipolar disorder (HCC)    Depression    Diabetes mellitus without complication (HCC)    Fibroid    s/p myomectomy 2010, hysterectomy 2013   GERD (gastroesophageal reflux disease)    Grave's disease    Heart murmur    Hormone disorder    Hyperlipidemia    Hypertension    Hypothyroidism    Memory loss    brain Fog per pt related to thyroid  condition   Migraine     otc med prn   PCOS (polycystic ovarian syndrome)    PMS (premenstrual syndrome)    PTSD (post-traumatic stress disorder)    Seasonal allergies    Thyroid  cancer (HCC) 2005   PAST HX   Thyroid  disease    Type II diabetes mellitus (HCC)    Vertigo    Past Surgical History:  Procedure Laterality Date   ABDOMINAL HYSTERECTOMY  10/11/2011   Procedure: HYSTERECTOMY ABDOMINAL;  Surgeon: Curlee VEAR Guan, MD;  Location: [facility];  Service: Gynecology;  Laterality: N/A;  With Repair of serosa.   ABDOMINAL HYSTERECTOMY     CHOLECYSTECTOMY N/A 09/06/2013   Procedure: LAPAROSCOPIC CHOLECYSTECTOMY WITH INTRAOPERATIVE CHOLANGIOGRAM;  Surgeon: Deward GORMAN Curvin DOUGLAS, MD;  Location: [facility];  Service: General;  Laterality: N/A;   COLONOSCOPY     DIAGNOSTIC LAPAROSCOPY     DILATION AND CURETTAGE OF UTERUS     HERNIA REPAIR     LAPAROSCOPIC APPENDECTOMY N/A 04/10/2021   Procedure: APPENDECTOMY LAPAROSCOPIC;  Surgeon: Sheldon Standing, MD;  Location: [facility];  Service: General;  Laterality: N/A;   MYOMECTOMY  06/13/2008   LAPAROSCOPY   thyroid  removed     TOTAL THYROIDECTOMY  06/13/2004   UMBILICAL HERNIA REPAIR  06/13/1980   Patient Active Problem List   Diagnosis Date Noted   Long term current use of oral hypoglycemic drug 02/07/2023   PAD (peripheral artery disease) (HCC) 07/12/2022   Hyperlipidemia associated with type 2 diabetes mellitus (HCC) 07/12/2022   Pain in both feet 12/06/2021   Generalized anxiety disorder 06/21/2021   Nightmare 06/21/2021   Overactive bladder 08/20/2020   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   Heme positive stool 07/15/2020   Morbid obesity with BMI of 40.0-44.9, adult (HCC) 07/09/2020   Bipolar 1 disorder, depressed, moderate (HCC) 02/10/2020   Breast pain, left 06/11/2019   Radiculopathy, cervical region 11/21/2018   Carpal tunnel syndrome, right upper limb 10/08/2018   Carpal tunnel syndrome, left upper limb 10/08/2018   Hypertension 09/30/2015   Type 2 diabetes mellitus with hyperglycemia, with long-term current use of insulin  (HCC) 09/30/2015   S/P cholecystectomy 09/05/2013   Borderline personality disorder (HCC) 03/14/2012    Class: Chronic   OCD (obsessive compulsive disorder) 03/14/2012    Class: Chronic   PTSD (post-traumatic stress  disorder) 03/14/2012    Class: Chronic   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    Class: Chronic   Lumbago without sciatica 03/12/2012   Major depression, recurrent (HCC) 03/08/2012    Class: Chronic   Snoring 12/02/2011   PCOS (polycystic ovarian syndrome) 09/05/2011   Hypothyroidism 08/13/2010   Vitamin D  deficiency 08/13/2010   ANEMIA-NOS 08/13/2010    PCP: Delbert Clam MD  REFERRING PROVIDER: Delores Army POUR PA-C  REFERRING DIAG: neck and shoulder pain  THERAPY DIAG: ncck pain; shoulder pain;weakness  Rationale for Evaluation and Treatment: Rehabilitation  ONSET DATE: off/on since January but worsened, last 3 months shoulder  stiffness  SUBJECTIVE:                                                                                                                                                                                                         SUBJECTIVE STATEMENT: Bil anterior shoulder pain coming from cervical spine per the doctor. No neck pain.   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 Yes NPRS scale: 7/10 Pain location: bil anterior shoulders; shooting pain to fingertips right worse than left Pain orientation: Bilateral  PAIN TYPE: aching, burning, and shooting Pain description: constant  Aggravating factors: mornings; all ranges of movement with the shoulders bring on pain and sharpness in arms (no neck movements cause the pain) Relieving factors: rest   PRECAUTIONS: None   WEIGHT BEARING RESTRICTIONS: No  FALLS:  Has patient fallen in last 6 months? 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  DIAGNOSTIC FINDINGS:  X-ray cervical =bone spurs   PATIENT SURVEYS: Quick DASH= 81.8% indicating severe to extreme difficulty with ADLs  The Patient-Specific Functional Scale  Initial:  I am going to ask you to identify up to 3 important activities that you are unable to do or are having difficulty with as a result of this problem.  Today are there any activities that you are unable to do or having difficulty with because of this?  (Patient shown  scale and patient rated  each activity)  Follow up: When you first came in you had difficulty performing these activities.  Today do you still have difficulty?  Patient-Specific activity scoring scheme (Point to one number):  0 1 2 3 4 5 6 7 8 9  10 Unable                                                                                                          Able to perform To perform                                                                                                    activity at the same Activity         Level as before                                                                                                                       Injury or problem  Activity        writing                                                                         Initial:        3                2.      Reaching behind back to put on a bra                       Initial:         1-2      3. Reaching shelves and into the fridge                              Initial: 3  COGNITION: Overall cognitive status: Within functional limits for tasks assessed  POSTURE:  moderately rounded shoulders  CERVICAL ROM:   Active ROM A/PROM (deg) eval  Flexion WFLs  Extension 40  Right lateral flexion 30  Left lateral flexion 30  Right rotation   Left rotation    (Blank rows = not tested)  UPPER EXTREMITY ROM: supine active: right flex 108, left 150; supine active-assisted flexion: right 130, left 155  Active ROM Right eval Left eval  Shoulder flexion 97 121  Shoulder extension 48 pain 55  Shoulder abduction 63 83  Shoulder adduction    Shoulder extension    Shoulder internal rotation Right buttock  Radial styloid to L5  Shoulder external rotation 54 88  Elbow flexion    Elbow extension    Wrist flexion    Wrist extension    Wrist ulnar deviation    Wrist radial deviation    Wrist pronation    Wrist supination     (Blank rows = not tested)  UPPER EXTREMITY MMT:  MMT  Right eval Left eval  Shoulder flexion 3- 3-  Shoulder extension    Shoulder abduction 2+ 2+  Shoulder adduction    Shoulder extension    Shoulder internal rotation 3 3  Shoulder external rotation 3 3  Middle trapezius    Lower trapezius    Elbow flexion    Elbow extension    Wrist flexion    Wrist extension    Wrist ulnar deviation    Wrist radial deviation    Wrist pronation    Wrist supination    Grip strength 23# 16#   (Blank rows = not tested)   TREATMENT DATE: 02/02/24 evaluation                                                                                                                                 PATIENT EDUCATION:  Education details: Educated patient on anatomy and physiology of current symptoms, prognosis, plan of care as well as initial self care strategies to promote recovery Person educated: Patient Education method: Explanation Education comprehension: verbalized understanding  HOME EXERCISE PROGRAM: Access Code: [email or web address] URL: [email or web address] Date: 02/02/2024 Prepared by: Glade Pesa  Exercises - Supine Shoulder Flexion Overhead with Dowel  - 1 x daily - 7 x weekly - 1 sets - 3-5 reps - 10 hold - Seated Scapular Retraction  - 1 x daily - 7 x weekly - 1 sets - 10 reps  ASSESSMENT:  CLINICAL IMPRESSION: Patient is a 53 y.o. female who was seen today for physical therapy evaluation and treatment for neck and shoulder pain.  She denies neck pain but reports she was told her bilateral shoulder and UE pain was referred from the neck.  She reports her ROM has worsened in the last 3 months.  In the seated position her ROM is significantly limited (right > left) in all planes of movement but particularly with elevation  right flexion 97, right abduction 63, left flexion 121, left abduction 83.  In supine (gravity-assisted) her ROM is better and less painful: right 108, left 150 degrees.  Limited strength and painful with all resisted  movements.  Her grip strength is decreased and below norms for age/gender. She is severely impaired with ADLs especially writing, reaching shelves and performing some dressing tasks.  She would benefit from PT to address these deficits.   OBJECTIVE IMPAIRMENTS: decreased activity tolerance, decreased ROM, decreased strength, increased fascial restrictions, impaired perceived functional ability, impaired UE functional use, and pain.   ACTIVITY LIMITATIONS: carrying, lifting, sleeping, dressing, reach over head, and hygiene/grooming  PARTICIPATION LIMITATIONS: meal prep, cleaning, laundry, driving, shopping, and community activity  PERSONAL FACTORS: Time since onset of injury/illness/exacerbation and 3+ comorbidities: Diabetes, age/gender putting pt at higher risk of adhesive capsulitis, multiple musculoskeletal/body regions affected are also affecting patient's functional outcome.   REHAB POTENTIAL: Good  CLINICAL DECISION MAKING: Evolving/moderate complexity  EVALUATION COMPLEXITY: Moderate   GOALS: Goals reviewed with patient? Yes  SHORT TERM GOALS: Target date: 03/01/2024    The patient will demonstrate knowledge of basic self care strategies and exercises to promote healing  Baseline:  Goal status: INITIAL  2. The patient will have improved shoulder elevation ROM to at least 125 degrees needed for  reaching high shelves  Baseline:  Goal status: INITIAL  3.  Right and left shoulder internal rotation improved in order to reach behind the back to lumbar region for bathing, dressing and toileting Baseline:  Goal status: INITIAL  4.  Improved grip strength on right to 26# and left to 18# needed for carrying groceries Baseline:  Goal status: INITIAL  5.  PSFS rating with writing improved to 4 Baseline:  Goal status: INITIAL   LONG TERM GOALS: Target date: 03/29/2024   The patient will be independent in a safe self progression of a home exercise program to promote further  recovery of function  Baseline:  Goal status: INITIAL  2.  The patient will have improved shoulder elevation ROM to at least 150 degrees needed for grooming/dressing purposes as well as reaching high shelves Baseline:  Goal status: INITIAL  3.  The patient will have grossly 4/5 strength needed to lift and lower a 3# object from a high shelf  Baseline:  Goal status: INITIAL  4.  PSFS rating with reaching behind the back for dressing improved to 4 Baseline:  Goal status: INITIAL  5.  Quick DASH improved to     68 % indicating improved function with less pain Baseline:  Goal status: INITIAL     PLAN:  PT FREQUENCY: 2x/week  PT DURATION: 8 weeks  PLANNED INTERVENTIONS: 97164- PT Re-evaluation, 97110-Therapeutic exercises, 97530- Therapeutic activity, 97112- Neuromuscular re-education, 97535- Self Care, [id number]- Manual therapy, [phone]- Aquatic Therapy, [phone]- Electrical stimulation (unattended), [phone]- Electrical stimulation (manual), [id number]- Ultrasound, [id number]- Traction (mechanical), [id number]- Ionotophoresis 4mg /ml Dexamethasone , 79439 (1-2 muscles), [id number] (3+ muscles)- Dry Needling, Patient/Family education, Taping, Joint mobilization, Spinal mobilization, Cryotherapy, and Moist heat  PLAN FOR NEXT SESSION: shoulder ROM bilaterally active- assistive and active; graded exposure to strengthening shoulder, grip strengthening, manual therapy including joint mobs; modalities and taping as needed for pain control  Glade Pesa, PT 02/02/24 6:41 PM Phone: [phone] Fax: [phone]

## 2024-02-04 NOTE — Therapy (Incomplete)
 OUTPATIENT PHYSICAL THERAPY CERVICAL TREATMENT   Patient Name: Lynn Martinez MRN: [id number] DOB:1970-07-03, 53 y.o., female Today's Date: 02/04/2024  END OF SESSION:    Past Medical History:  Diagnosis Date   Allergy    Anemia    Anxiety    Arthritis    knees (09/30/2015)   Bipolar disorder (HCC)    Depression    Diabetes mellitus without complication (HCC)    Fibroid    s/p myomectomy 2010, hysterectomy 2013   GERD (gastroesophageal reflux disease)    Grave's disease    Heart murmur    Hormone disorder    Hyperlipidemia    Hypertension    Hypothyroidism    Memory loss    brain Fog per pt related to thyroid  condition   Migraine     otc med prn   PCOS (polycystic ovarian syndrome)    PMS (premenstrual syndrome)    PTSD (post-traumatic stress disorder)    Seasonal allergies    Thyroid  cancer (HCC) 2005   PAST HX   Thyroid  disease    Type II diabetes mellitus (HCC)    Vertigo    Past Surgical History:  Procedure Laterality Date   ABDOMINAL HYSTERECTOMY  10/11/2011   Procedure: HYSTERECTOMY ABDOMINAL;  Surgeon: Curlee VEAR Guan, MD;  Location: [facility];  Service: Gynecology;  Laterality: N/A;  With Repair of serosa.   ABDOMINAL HYSTERECTOMY     CHOLECYSTECTOMY N/A 09/06/2013   Procedure: LAPAROSCOPIC CHOLECYSTECTOMY WITH INTRAOPERATIVE CHOLANGIOGRAM;  Surgeon: Deward GORMAN Curvin DOUGLAS, MD;  Location: [facility];  Service: General;  Laterality: N/A;   COLONOSCOPY     DIAGNOSTIC LAPAROSCOPY     DILATION AND CURETTAGE OF UTERUS     HERNIA REPAIR     LAPAROSCOPIC APPENDECTOMY N/A 04/10/2021   Procedure: APPENDECTOMY LAPAROSCOPIC;  Surgeon: Sheldon Standing, MD;  Location: [facility];  Service: General;  Laterality: N/A;   MYOMECTOMY  06/13/2008   LAPAROSCOPY   thyroid  removed     TOTAL THYROIDECTOMY  06/13/2004   UMBILICAL HERNIA REPAIR  06/13/1980   Patient Active Problem List   Diagnosis Date Noted   Long term current use of oral hypoglycemic drug 02/07/2023    PAD (peripheral artery disease) (HCC) 07/12/2022   Hyperlipidemia associated with type 2 diabetes mellitus (HCC) 07/12/2022   Pain in both feet 12/06/2021   Generalized anxiety disorder 06/21/2021   Nightmare 06/21/2021   Overactive bladder 08/20/2020   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   Heme positive stool 07/15/2020   Morbid obesity with BMI of 40.0-44.9, adult (HCC) 07/09/2020   Bipolar 1 disorder, depressed, moderate (HCC) 02/10/2020   Breast pain, left 06/11/2019   Radiculopathy, cervical region 11/21/2018   Carpal tunnel syndrome, right upper limb 10/08/2018   Carpal tunnel syndrome, left upper limb 10/08/2018   Hypertension 09/30/2015   Type 2 diabetes mellitus with hyperglycemia, with long-term current use of insulin  (HCC) 09/30/2015   S/P cholecystectomy 09/05/2013   Borderline personality disorder (HCC) 03/14/2012    Class: Chronic   OCD (obsessive compulsive disorder) 03/14/2012    Class: Chronic   PTSD (post-traumatic stress disorder) 03/14/2012    Class: Chronic   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    Class: Chronic   Lumbago without sciatica 03/12/2012   Major depression, recurrent (HCC) 03/08/2012    Class: Chronic   Snoring 12/02/2011   PCOS (polycystic ovarian syndrome) 09/05/2011   Hypothyroidism 08/13/2010   Vitamin D  deficiency 08/13/2010   ANEMIA-NOS 08/13/2010    PCP: Delbert Clam MD  REFERRING PROVIDER: Delores Army POUR PA-C  REFERRING DIAG: neck and shoulder pain  THERAPY DIAG: ncck pain; shoulder pain;weakness  Rationale for Evaluation and Treatment: Rehabilitation  ONSET DATE: off/on since January but worsened, last 3 months shoulder stiffness  SUBJECTIVE:                                                                                                                                                                                                         SUBJECTIVE STATEMENT: *** Hand dominance: Right  PERTINENT HISTORY:  Husband  recently passed away 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 Yes NPRS scale: 7/10 Pain location: bil anterior shoulders; shooting pain to fingertips right worse than left Pain orientation: Bilateral  PAIN TYPE: aching, burning, and shooting Pain description: constant  Aggravating factors: mornings; all ranges of movement with the shoulders bring on pain and sharpness in arms (no neck movements cause the pain) Relieving factors: rest   PRECAUTIONS: None   WEIGHT BEARING RESTRICTIONS: No  FALLS:  Has patient fallen in last 6 months? 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  DIAGNOSTIC FINDINGS:  X-ray cervical =bone spurs   PATIENT SURVEYS: Quick DASH= 81.8% indicating severe to extreme difficulty with ADLs  The Patient-Specific Functional Scale  Initial:  I am going to ask you to identify up to 3 important activities that you are unable to do or are having difficulty with as a result of this problem.  Today are there any activities that you are unable to do or having difficulty with because of this?  (Patient shown scale and patient rated each activity)  Follow up: When you first came in you had difficulty performing these activities.  Today do you still have difficulty?  Patient-Specific activity scoring scheme (Point to one number):  0 1 2 3 4 5 6 7 8 9  10 Unable  Able to perform To perform                                                                                                     activity at the same Activity         Level as before                                                                                                                       Injury or problem  Activity        writing                                                                         Initial:        3                2.      Reaching behind back to put on a bra                       Initial:         1-2      3. Reaching shelves and into the fridge                              Initial: 3                            COGNITION: Overall cognitive status: Within functional limits for tasks assessed  POSTURE:  moderately rounded shoulders  CERVICAL ROM:   Active ROM A/PROM (deg) eval  Flexion WFLs  Extension 40  Right lateral flexion 30  Left lateral flexion 30  Right rotation   Left rotation    (Blank rows = not tested)  UPPER EXTREMITY ROM: supine active: right flex 108, left 150; supine active-assisted flexion: right 130, left 155  Active ROM Right eval Left eval  Shoulder flexion 97 121  Shoulder extension 48 pain 55  Shoulder abduction 63 83  Shoulder adduction    Shoulder extension    Shoulder internal rotation Right buttock  Radial styloid to L5  Shoulder external rotation 54 88  Elbow flexion    Elbow extension  Wrist flexion    Wrist extension    Wrist ulnar deviation    Wrist radial deviation    Wrist pronation    Wrist supination     (Blank rows = not tested)  UPPER EXTREMITY MMT:  MMT Right eval Left eval  Shoulder flexion 3- 3-  Shoulder extension    Shoulder abduction 2+ 2+  Shoulder adduction    Shoulder extension    Shoulder internal rotation 3 3  Shoulder external rotation 3 3  Middle trapezius    Lower trapezius    Elbow flexion    Elbow extension    Wrist flexion    Wrist extension    Wrist ulnar deviation    Wrist radial deviation    Wrist pronation    Wrist supination    Grip strength 23#  16#   (Blank rows = not tested)   TREATMENT DATE:  02/05/24 Pulleys Seated scapular retraction Seated ER with dowel Standing ABD Standing IR with dowel Supine flexion with dowel   02/02/24 evaluation                                                                                                                                 PATIENT EDUCATION:  Education details: Educated patient on anatomy and physiology of current symptoms, prognosis, plan of care as well as initial self care strategies to promote recovery Person educated: Patient Education method: Explanation Education comprehension: verbalized understanding  HOME EXERCISE PROGRAM: Access Code: [email or web address] URL: [email or web address] Date: 02/02/2024 Prepared by: Glade Pesa  Exercises - Supine Shoulder Flexion Overhead with Dowel  - 1 x daily - 7 x weekly - 1 sets - 3-5 reps - 10 hold - Seated Scapular Retraction  - 1 x daily - 7 x weekly - 1 sets - 10 reps  ASSESSMENT:  CLINICAL IMPRESSION: ***   OBJECTIVE IMPAIRMENTS: decreased activity tolerance, decreased ROM, decreased strength, increased fascial restrictions, impaired perceived functional ability, impaired UE functional use, and pain.   ACTIVITY LIMITATIONS: carrying, lifting, sleeping, dressing, reach over head, and hygiene/grooming  PARTICIPATION LIMITATIONS: meal prep, cleaning, laundry, driving, shopping, and community activity  PERSONAL FACTORS: Time since onset of injury/illness/exacerbation and 3+ comorbidities: Diabetes, age/gender putting pt at higher risk of adhesive capsulitis, multiple musculoskeletal/body regions affected are also affecting patient's functional outcome.   REHAB POTENTIAL: Good  CLINICAL DECISION MAKING: Evolving/moderate complexity  EVALUATION COMPLEXITY: Moderate   GOALS: Goals reviewed with patient? Yes  SHORT TERM GOALS: Target date: 03/01/2024    The patient will demonstrate knowledge of basic self care  strategies and exercises to promote healing  Baseline:  Goal status: INITIAL  2. The patient will have improved shoulder elevation ROM to at least 125 degrees needed for  reaching high shelves  Baseline:  Goal status: INITIAL  3.  Right and left shoulder internal rotation improved in order to reach behind the back to lumbar region for bathing, dressing and toileting Baseline:  Goal status: INITIAL  4.  Improved grip strength on right to 26# and left to 18# needed for carrying groceries Baseline:  Goal status: INITIAL  5.  PSFS rating with writing improved to 4 Baseline:  Goal status: INITIAL   LONG TERM GOALS: Target date: 03/29/2024   The patient will be independent in a safe self progression of a home exercise program to promote further recovery of function  Baseline:  Goal status: INITIAL  2.  The patient will have improved shoulder elevation ROM to at least 150 degrees needed for grooming/dressing purposes as well as reaching high shelves Baseline:  Goal status: INITIAL  3.  The patient will have grossly 4/5 strength needed to lift and lower a 3# object from a high shelf  Baseline:  Goal status: INITIAL  4.  PSFS rating with reaching behind the back for dressing improved to 4 Baseline:  Goal status: INITIAL  5.  Quick DASH improved to     68 % indicating improved function with less pain Baseline:  Goal status: INITIAL     PLAN:  PT FREQUENCY: 2x/week  PT DURATION: 8 weeks  PLANNED INTERVENTIONS: 97164- PT Re-evaluation, 97110-Therapeutic exercises, 97530- Therapeutic activity, 97112- Neuromuscular re-education, 97535- Self Care, 02859- Manual therapy, [phone]- Aquatic Therapy, [phone]- Electrical stimulation (unattended), [phone]- Electrical stimulation (manual), [id number]- Ultrasound, [id number]- Traction (mechanical), [id number]- Ionotophoresis 4mg /ml Dexamethasone , 79439 (1-2 muscles), 20561 (3+ muscles)- Dry Needling, Patient/Family education, Taping, Joint mobilization,  Spinal mobilization, Cryotherapy, and Moist heat  PLAN FOR NEXT SESSION: *** shoulder ROM bilaterally active- assistive and active; graded exposure to strengthening shoulder, grip strengthening, manual therapy including joint mobs; modalities and taping as needed for pain control  Mliss Cummins, PT  02/04/24 6:18 PM Phone: [phone] Fax: [phone]

## 2024-02-05 ENCOUNTER — Encounter: Payer: MEDICAID | Admitting: Physical Therapy

## 2024-02-05 ENCOUNTER — Ambulatory Visit: Payer: Self-pay

## 2024-02-05 ENCOUNTER — Telehealth: Payer: Self-pay | Admitting: Family Medicine

## 2024-02-05 NOTE — Telephone Encounter (Signed)
 Copied from CRM #[id number]. Topic: Referral - Request for Referral >> Feb 05, 2024  3:43 PM Delon DASEN wrote:  Did the patient discuss referral with their provider in the last year? Yes (If No - schedule appointment) (If Yes - send message)  Appointment offered? Yes  Type of order/referral and detailed reason for visit: ophthalmologist and also need referral for infertilty for shouder pain  Preference of office, provider, location: n/a  If referral order, have you been seen by this specialty before? No (If Yes, this issue or another issue? When? Where?  Can we respond through MyChart? Yes

## 2024-02-05 NOTE — Telephone Encounter (Signed)
 Patient has been called and given appointment for 02/07/24 at 210

## 2024-02-05 NOTE — Telephone Encounter (Signed)
 Patient has upcoming appointment

## 2024-02-05 NOTE — Telephone Encounter (Signed)
 FYI Only or Action Required?: FYI only for provider.  Patient was last seen in primary care on 01/02/2024 by Newlin, Enobong, MD.  Called Nurse Triage reporting Eye Pain.  Symptoms began several days ago.  Interventions attempted: Nothing.  Symptoms are: gradually worsening.  Triage Disposition: See Physician Within 24 Hours  Patient/caregiver understands and will follow disposition?: Yes   Message from [place] T sent at 02/05/2024  3:45 PM EDT  Left eye swollen and red, hurts to touch it  [phone]   Reason for Disposition  Eyelid is red and painful (or tender to touch)  Answer Assessment - Initial Assessment Questions Additional info: No acute appointments are available at practice location until September 15th, patient will proceed to urgent care for evaluation.   1. LOCATION: Where is the redness? (e.g., eyeball or outer eyelids) Note: When callers say the eye is red, they usually mean the sclera (white of the eye) is red.       Left eye 2. ONE OR BOTH EYES: Is the redness in one or both eyes?      Left  3. ONSET: When did the redness start? (e.g., hours, days)       3 days ago  4. EYELIDS: Are the eyelids red or swollen? If Yes, ask: How much?      Mild swelling 5. VISION: Do you have blurred vision?     Denies  6. ITCHING: Does it feel itchy? If so ask: How bad is it (Scale 1-10; or mild, moderate, severe)     Denies  7. PAIN: Is it painful? If Yes, ask: How bad is the pain? (Scale 0-10; or none, mild, moderate, severe)     5/10 8. CONTACT LENS: Do you wear contacts?      9. CAUSE: What do you think is causing the redness?     Unsure 10. OTHER SYMPTOMS: Do you have any other symptoms? (e.g., fever, runny nose, cough, vomiting)       denies  Protocols used: Eye - Redness-A-AH

## 2024-02-06 ENCOUNTER — Telehealth: Payer: Self-pay | Admitting: Family Medicine

## 2024-02-06 NOTE — Telephone Encounter (Signed)
 Pt confirmed appt 8/26

## 2024-02-07 ENCOUNTER — Encounter: Payer: Self-pay | Admitting: Family Medicine

## 2024-02-07 ENCOUNTER — Ambulatory Visit: Payer: MEDICAID | Attending: Family Medicine | Admitting: Family Medicine

## 2024-02-07 VITALS — BP 116/80 | HR 78 | Ht 67.0 in | Wt 245.4 lb

## 2024-02-07 DIAGNOSIS — H1032 Unspecified acute conjunctivitis, left eye: Secondary | ICD-10-CM | POA: Diagnosis not present

## 2024-02-07 DIAGNOSIS — N970 Female infertility associated with anovulation: Secondary | ICD-10-CM

## 2024-02-07 DIAGNOSIS — Z23 Encounter for immunization: Secondary | ICD-10-CM

## 2024-02-07 MED ORDER — OFLOXACIN 0.3 % OP SOLN: 1.0000 [drp] | Freq: Four times a day (QID) | OPHTHALMIC | 0 refills | Status: DC
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
## 2024-02-07 NOTE — Addendum Note (Signed)
 Addended by: LONITA FLORETTE GAILS on: 02/07/2024 03:05 PM   Modules accepted: Orders

## 2024-02-07 NOTE — Patient Instructions (Signed)
 VISIT SUMMARY:  Today, you came in for a referral to an infertility specialist to discuss egg retrieval options following your hysterectomy. You also reported redness and pain in your left eye, which has been bothering you for about a week. Additionally, we reviewed your recent shingles vaccination and upcoming colonoscopy consultation.  YOUR PLAN:  -INFERTILITY: You are interested in exploring fertility options after your hysterectomy. We will refer you to a gynecologist who specializes in fertility to discuss egg retrieval and your estrogen levels. You have completed your shingles vaccination series, and you are due for a flu shot, which we will administer today. We will also ensure that your colonoscopy consultation is scheduled.  -CONJUNCTIVITIS, LEFT EYE: Conjunctivitis is an inflammation or infection of the outer membrane of your eyeball and the inner eyelid. Your symptoms suggest a possible viral infection. We will prescribe antibiotic eye drops to help alleviate the redness, tearing, itching, and burning in your left eye.  INSTRUCTIONS:  Please follow up with the gynecologist for your fertility consultation as soon as possible. Use the prescribed antibiotic eye drops as directed for your left eye. Ensure you receive your flu shot today and confirm your colonoscopy consultation is scheduled.

## 2024-02-08 ENCOUNTER — Ambulatory Visit: Payer: MEDICAID

## 2024-02-13 ENCOUNTER — Other Ambulatory Visit: Payer: Self-pay | Admitting: Family Medicine

## 2024-02-14 ENCOUNTER — Ambulatory Visit: Payer: MEDICAID | Attending: Surgical | Admitting: Physical Therapy

## 2024-02-14 DIAGNOSIS — M25512 Pain in left shoulder: Secondary | ICD-10-CM | POA: Insufficient documentation

## 2024-02-14 DIAGNOSIS — M6281 Muscle weakness (generalized): Secondary | ICD-10-CM | POA: Insufficient documentation

## 2024-02-14 DIAGNOSIS — M25611 Stiffness of right shoulder, not elsewhere classified: Secondary | ICD-10-CM | POA: Insufficient documentation

## 2024-02-14 DIAGNOSIS — M25612 Stiffness of left shoulder, not elsewhere classified: Secondary | ICD-10-CM | POA: Insufficient documentation

## 2024-02-14 DIAGNOSIS — M25511 Pain in right shoulder: Secondary | ICD-10-CM | POA: Insufficient documentation

## 2024-02-14 NOTE — Telephone Encounter (Signed)
 Called and spoke with the patient about her no-show today.  She states she didn't know she had an appt.  She was reminded of her next appt tomorrow at 11:45.  She thought that would be better since it is later in the morning.

## 2024-02-15 ENCOUNTER — Ambulatory Visit: Payer: MEDICAID | Admitting: Physical Therapy

## 2024-02-15 NOTE — Therapy (Incomplete)
 OUTPATIENT PHYSICAL THERAPY CERVICAL TREATMENT NOTE   Patient Name: Lynn Martinez MRN: [id number] DOB:1970/09/28, 53 y.o., female Today's Date: 02/15/2024  END OF SESSION:    Past Medical History:  Diagnosis Date   Allergy    Anemia    Anxiety    Arthritis    knees (09/30/2015)   Bipolar disorder (HCC)    Depression    Diabetes mellitus without complication (HCC)    Fibroid    s/p myomectomy 2010, hysterectomy 2013   GERD (gastroesophageal reflux disease)    Grave's disease    Heart murmur    Hormone disorder    Hyperlipidemia    Hypertension    Hypothyroidism    Memory loss    brain Fog per pt related to thyroid  condition   Migraine     otc med prn   PCOS (polycystic ovarian syndrome)    PMS (premenstrual syndrome)    PTSD (post-traumatic stress disorder)    Seasonal allergies    Thyroid  cancer (HCC) 2005   PAST HX   Thyroid  disease    Type II diabetes mellitus (HCC)    Vertigo    Past Surgical History:  Procedure Laterality Date   ABDOMINAL HYSTERECTOMY  10/11/2011   Procedure: HYSTERECTOMY ABDOMINAL;  Surgeon: Curlee VEAR Guan, MD;  Location: [facility];  Service: Gynecology;  Laterality: N/A;  With Repair of serosa.   ABDOMINAL HYSTERECTOMY     CHOLECYSTECTOMY N/A 09/06/2013   Procedure: LAPAROSCOPIC CHOLECYSTECTOMY WITH INTRAOPERATIVE CHOLANGIOGRAM;  Surgeon: Deward GORMAN Curvin DOUGLAS, MD;  Location: [facility];  Service: General;  Laterality: N/A;   COLONOSCOPY     DIAGNOSTIC LAPAROSCOPY     DILATION AND CURETTAGE OF UTERUS     HERNIA REPAIR     LAPAROSCOPIC APPENDECTOMY N/A 04/10/2021   Procedure: APPENDECTOMY LAPAROSCOPIC;  Surgeon: Sheldon Standing, MD;  Location: [facility];  Service: General;  Laterality: N/A;   MYOMECTOMY  06/13/2008   LAPAROSCOPY   thyroid  removed     TOTAL THYROIDECTOMY  06/13/2004   UMBILICAL HERNIA REPAIR  06/13/1980   Patient Active Problem List   Diagnosis Date Noted   Long term current use of oral hypoglycemic drug  02/07/2023   PAD (peripheral artery disease) (HCC) 07/12/2022   Hyperlipidemia associated with type 2 diabetes mellitus (HCC) 07/12/2022   Pain in both feet 12/06/2021   Generalized anxiety disorder 06/21/2021   Nightmare 06/21/2021   Overactive bladder 08/20/2020   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   Heme positive stool 07/15/2020   Morbid obesity with BMI of 40.0-44.9, adult (HCC) 07/09/2020   Bipolar 1 disorder, depressed, moderate (HCC) 02/10/2020   Breast pain, left 06/11/2019   Radiculopathy, cervical region 11/21/2018   Carpal tunnel syndrome, right upper limb 10/08/2018   Carpal tunnel syndrome, left upper limb 10/08/2018   Hypertension 09/30/2015   Type 2 diabetes mellitus with hyperglycemia, with long-term current use of insulin  (HCC) 09/30/2015   S/P cholecystectomy 09/05/2013   Borderline personality disorder (HCC) 03/14/2012    Class: Chronic   OCD (obsessive compulsive disorder) 03/14/2012    Class: Chronic   PTSD (post-traumatic stress disorder) 03/14/2012    Class: Chronic   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    Class: Chronic   Lumbago without sciatica 03/12/2012   Major depression, recurrent (HCC) 03/08/2012    Class: Chronic   Snoring 12/02/2011   PCOS (polycystic ovarian syndrome) 09/05/2011   Hypothyroidism 08/13/2010   Vitamin D  deficiency 08/13/2010   ANEMIA-NOS 08/13/2010    PCP: Delbert Clam MD  REFERRING PROVIDER: Delores Army POUR PA-C  REFERRING DIAG: neck and shoulder pain  THERAPY DIAG: ncck pain; shoulder pain;weakness  Rationale for Evaluation and Treatment: Rehabilitation  ONSET DATE: off/on since January but worsened, last 3 months shoulder stiffness  SUBJECTIVE:                                                                                                                                                                                                         SUBJECTIVE STATEMENT: Bil anterior shoulder pain coming from cervical  spine per the doctor. No neck pain.   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 Yes NPRS scale: 7/10 Pain location: bil anterior shoulders; shooting pain to fingertips right worse than left Pain orientation: Bilateral  PAIN TYPE: aching, burning, and shooting Pain description: constant  Aggravating factors: mornings; all ranges of movement with the shoulders bring on pain and sharpness in arms (no neck movements cause the pain) Relieving factors: rest   PRECAUTIONS: None   WEIGHT BEARING RESTRICTIONS: No  FALLS:  Has patient fallen in last 6 months? 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  DIAGNOSTIC FINDINGS:  X-ray cervical =bone spurs   PATIENT SURVEYS: Quick DASH= 81.8% indicating severe to extreme difficulty with ADLs  The Patient-Specific Functional Scale  Initial:  I am going to ask you to identify up to 3 important activities that you are unable to do or are having difficulty with as a result of this problem.  Today are there any activities that you are unable to do or having difficulty with because of this?  (Patient shown scale and patient rated each activity)  Follow up: When you first came in you had difficulty performing these activities.  Today do you still have difficulty?  Patient-Specific activity scoring scheme (Point to one number):  0 1 2 3 4 5 6 7 8 9  10 Unable  Able to perform To perform                                                                                                    activity at the same Activity         Level as before                                                                                                                       Injury or problem  Activity        writing                                                                         Initial:        3                2.      Reaching behind back to put on a bra                       Initial:         1-2      3. Reaching shelves and into the fridge                              Initial: 3                            COGNITION: Overall cognitive status: Within functional limits for tasks assessed  POSTURE:  moderately rounded shoulders  CERVICAL ROM:   Active ROM A/PROM (deg) eval  Flexion WFLs  Extension 40  Right lateral flexion 30  Left lateral flexion 30  Right rotation   Left rotation    (Blank rows = not tested)  UPPER EXTREMITY ROM: supine active: right flex 108, left 150; supine active-assisted flexion: right 130, left 155  Active ROM Right eval Left eval  Shoulder flexion 97 121  Shoulder extension 48 pain 55  Shoulder abduction 63 83  Shoulder adduction    Shoulder extension    Shoulder internal rotation Right buttock  Radial styloid to L5  Shoulder external rotation 54 88  Elbow flexion    Elbow extension    Wrist  flexion    Wrist extension    Wrist ulnar deviation    Wrist radial deviation    Wrist pronation    Wrist supination     (Blank rows = not tested)  UPPER EXTREMITY MMT:  MMT Right eval Left eval  Shoulder flexion 3- 3-  Shoulder extension    Shoulder abduction 2+ 2+  Shoulder adduction    Shoulder extension    Shoulder internal rotation 3 3  Shoulder external rotation 3 3  Middle trapezius    Lower trapezius    Elbow flexion    Elbow extension    Wrist flexion     Wrist extension    Wrist ulnar deviation    Wrist radial deviation    Wrist pronation    Wrist supination    Grip strength 23# 16#   (Blank rows = not tested)   TREATMENT DATE:  02/15/24:   02/02/24 evaluation                                                                                                                                 PATIENT EDUCATION:  Education details: Educated patient on anatomy and physiology of current symptoms, prognosis, plan of care as well as initial self care strategies to promote recovery Person educated: Patient Education method: Explanation Education comprehension: verbalized understanding  HOME EXERCISE PROGRAM: Access Code: [email or web address] URL: [email or web address] Date: 02/02/2024 Prepared by: Glade Pesa  Exercises - Supine Shoulder Flexion Overhead with Dowel  - 1 x daily - 7 x weekly - 1 sets - 3-5 reps - 10 hold - Seated Scapular Retraction  - 1 x daily - 7 x weekly - 1 sets - 10 reps  ASSESSMENT:  CLINICAL IMPRESSION: First follow up since initial evaluation today.    Eval:Patient is a 53 y.o. female who was seen today for physical therapy evaluation and treatment for neck and shoulder pain.  She denies neck pain but reports she was told her bilateral shoulder and UE pain was referred from the neck.  She reports her ROM has worsened in the last 3 months.  In the seated position her ROM is significantly limited (right > left) in all planes of movement but particularly with elevation right flexion 97, right abduction 63, left flexion 121, left abduction 83.  In supine (gravity-assisted) her ROM is better and less painful: right 108, left 150 degrees.  Limited strength and painful with all resisted movements.  Her grip strength is decreased and below norms for age/gender. She is severely impaired with ADLs especially writing, reaching shelves and performing some dressing tasks.  She would benefit from PT to address these deficits.    OBJECTIVE IMPAIRMENTS: decreased activity tolerance, decreased ROM, decreased strength, increased fascial restrictions, impaired perceived functional ability, impaired UE functional use, and pain.   ACTIVITY LIMITATIONS: carrying, lifting, sleeping, dressing, reach over head, and hygiene/grooming  PARTICIPATION LIMITATIONS: meal prep, cleaning, laundry, driving, shopping, and community activity  PERSONAL FACTORS: Time since onset of injury/illness/exacerbation and 3+ comorbidities: Diabetes, age/gender putting pt at higher risk of adhesive capsulitis, multiple musculoskeletal/body regions affected are also affecting patient's functional outcome.   REHAB POTENTIAL: Good  CLINICAL DECISION MAKING: Evolving/moderate complexity  EVALUATION COMPLEXITY: Moderate   GOALS: Goals reviewed with patient? Yes  SHORT TERM GOALS: Target date: 03/01/2024    The patient will demonstrate knowledge of basic self care strategies and exercises to promote healing  Baseline:  Goal status: INITIAL  2. The patient will have improved shoulder elevation ROM to at least 125 degrees needed for  reaching high shelves  Baseline:  Goal status: INITIAL  3.  Right and left shoulder internal rotation improved in order to reach behind the back to lumbar region for bathing, dressing and toileting Baseline:  Goal status: INITIAL  4.  Improved grip strength on right to 26# and left to 18# needed for carrying groceries Baseline:  Goal status: INITIAL  5.  PSFS rating with writing improved to 4 Baseline:  Goal status: INITIAL   LONG TERM GOALS: Target date: 03/29/2024   The patient will be independent in a safe self progression of a home exercise program to promote further recovery of function  Baseline:  Goal status: INITIAL  2.  The patient will have improved shoulder elevation ROM to at least 150 degrees needed for grooming/dressing purposes as well as reaching high shelves Baseline:  Goal status:  INITIAL  3.  The patient will have grossly 4/5 strength needed to lift and lower a 3# object from a high shelf  Baseline:  Goal status: INITIAL  4.  PSFS rating with reaching behind the back for dressing improved to 4 Baseline:  Goal status: INITIAL  5.  Quick DASH improved to     68 % indicating improved function with less pain Baseline:  Goal status: INITIAL     PLAN:  PT FREQUENCY: 2x/week  PT DURATION: 8 weeks  PLANNED INTERVENTIONS: 97164- PT Re-evaluation, 97110-Therapeutic exercises, 97530- Therapeutic activity, 97112- Neuromuscular re-education, 97535- Self Care, [id number]- Manual therapy, [phone]- Aquatic Therapy, [phone]- Electrical stimulation (unattended), [phone]- Electrical stimulation (manual), [id number]- Ultrasound, [id number]- Traction (mechanical), [id number]- Ionotophoresis 4mg /ml Dexamethasone , 79439 (1-2 muscles), 20561 (3+ muscles)- Dry Needling, Patient/Family education, Taping, Joint mobilization, Spinal mobilization, Cryotherapy, and Moist heat  PLAN FOR NEXT SESSION: shoulder ROM bilaterally active- assistive and active; graded exposure to strengthening shoulder, grip strengthening, manual therapy including joint mobs; modalities and taping as needed for pain control  Glade Pesa, PT 02/15/24 9:57 AM Phone: [phone] Fax: [phone]

## 2024-02-20 NOTE — Therapy (Signed)
 OUTPATIENT PHYSICAL THERAPY CERVICAL TREATMENT NOTE   Patient Name: Lynn Martinez MRN: [id number] DOB:1970/08/10, 53 y.o., female Today's Date: 02/20/2024  END OF SESSION:    Past Medical History:  Diagnosis Date   Allergy    Anemia    Anxiety    Arthritis    knees (09/30/2015)   Bipolar disorder (HCC)    Depression    Diabetes mellitus without complication (HCC)    Fibroid    s/p myomectomy 2010, hysterectomy 2013   GERD (gastroesophageal reflux disease)    Grave's disease    Heart murmur    Hormone disorder    Hyperlipidemia    Hypertension    Hypothyroidism    Memory loss    brain Fog per pt related to thyroid  condition   Migraine     otc med prn   PCOS (polycystic ovarian syndrome)    PMS (premenstrual syndrome)    PTSD (post-traumatic stress disorder)    Seasonal allergies    Thyroid  cancer (HCC) 2005   PAST HX   Thyroid  disease    Type II diabetes mellitus (HCC)    Vertigo    Past Surgical History:  Procedure Laterality Date   ABDOMINAL HYSTERECTOMY  10/11/2011   Procedure: HYSTERECTOMY ABDOMINAL;  Surgeon: Curlee VEAR Guan, MD;  Location: [facility];  Service: Gynecology;  Laterality: N/A;  With Repair of serosa.   ABDOMINAL HYSTERECTOMY     CHOLECYSTECTOMY N/A 09/06/2013   Procedure: LAPAROSCOPIC CHOLECYSTECTOMY WITH INTRAOPERATIVE CHOLANGIOGRAM;  Surgeon: Deward GORMAN Curvin DOUGLAS, MD;  Location: [facility];  Service: General;  Laterality: N/A;   COLONOSCOPY     DIAGNOSTIC LAPAROSCOPY     DILATION AND CURETTAGE OF UTERUS     HERNIA REPAIR     LAPAROSCOPIC APPENDECTOMY N/A 04/10/2021   Procedure: APPENDECTOMY LAPAROSCOPIC;  Surgeon: Sheldon Standing, MD;  Location: [facility];  Service: General;  Laterality: N/A;   MYOMECTOMY  06/13/2008   LAPAROSCOPY   thyroid  removed     TOTAL THYROIDECTOMY  06/13/2004   UMBILICAL HERNIA REPAIR  06/13/1980   Patient Active Problem List   Diagnosis Date Noted   Long term current use of oral hypoglycemic drug  02/07/2023   PAD (peripheral artery disease) (HCC) 07/12/2022   Hyperlipidemia associated with type 2 diabetes mellitus (HCC) 07/12/2022   Pain in both feet 12/06/2021   Generalized anxiety disorder 06/21/2021   Nightmare 06/21/2021   Overactive bladder 08/20/2020   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   Heme positive stool 07/15/2020   Morbid obesity with BMI of 40.0-44.9, adult (HCC) 07/09/2020   Bipolar 1 disorder, depressed, moderate (HCC) 02/10/2020   Breast pain, left 06/11/2019   Radiculopathy, cervical region 11/21/2018   Carpal tunnel syndrome, right upper limb 10/08/2018   Carpal tunnel syndrome, left upper limb 10/08/2018   Hypertension 09/30/2015   Type 2 diabetes mellitus with hyperglycemia, with long-term current use of insulin  (HCC) 09/30/2015   S/P cholecystectomy 09/05/2013   Borderline personality disorder (HCC) 03/14/2012    Class: Chronic   OCD (obsessive compulsive disorder) 03/14/2012    Class: Chronic   PTSD (post-traumatic stress disorder) 03/14/2012    Class: Chronic   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    Class: Chronic   Lumbago without sciatica 03/12/2012   Major depression, recurrent (HCC) 03/08/2012    Class: Chronic   Snoring 12/02/2011   PCOS (polycystic ovarian syndrome) 09/05/2011   Hypothyroidism 08/13/2010   Vitamin D  deficiency 08/13/2010   ANEMIA-NOS 08/13/2010    PCP: Delbert Clam MD  REFERRING PROVIDER: Delores Army POUR PA-C  REFERRING DIAG: neck and shoulder pain  THERAPY DIAG: ncck pain; shoulder pain;weakness  Rationale for Evaluation and Treatment: Rehabilitation  ONSET DATE: off/on since January but worsened, last 3 months shoulder stiffness  SUBJECTIVE:                                                                                                                                                                                                         SUBJECTIVE STATEMENT: ****  Bil anterior shoulder pain coming from  cervical spine per the doctor. No neck pain.   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 Yes NPRS scale: 7/10 Pain location: bil anterior shoulders; shooting pain to fingertips right worse than left Pain orientation: Bilateral  PAIN TYPE: aching, burning, and shooting Pain description: constant  Aggravating factors: mornings; all ranges of movement with the shoulders bring on pain and sharpness in arms (no neck movements cause the pain) Relieving factors: rest   PRECAUTIONS: None   WEIGHT BEARING RESTRICTIONS: No  FALLS:  Has patient fallen in last 6 months? 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  DIAGNOSTIC FINDINGS:  X-ray cervical =bone spurs   PATIENT SURVEYS: Quick DASH= 81.8% indicating severe to extreme difficulty with ADLs  The Patient-Specific Functional Scale  Initial:  I am going to ask you to identify up to 3 important activities that you are unable to do or are having difficulty with as a result of this problem.  Today are there any activities that you are unable to do or having difficulty with because of this?  (Patient shown scale and patient rated each activity)  Follow up: When you first came in you had difficulty performing these activities.  Today do you still have difficulty?  Patient-Specific activity scoring scheme (Point to one number):  0 1 2 3 4 5 6 7 8 9  10 Unable  Able to perform To perform                                                                                                    activity at the same Activity         Level as before                                                                                                                       Injury or problem  Activity        writing                                                                         Initial:        3                2.      Reaching behind back to put on a bra                       Initial:         1-2      3. Reaching shelves and into the fridge                              Initial: 3                            COGNITION: Overall cognitive status: Within functional limits for tasks assessed  POSTURE:  moderately rounded shoulders  CERVICAL ROM:   Active ROM A/PROM (deg) eval  Flexion WFLs  Extension 40  Right lateral flexion 30  Left lateral flexion 30  Right rotation   Left rotation    (Blank rows = not tested)  UPPER EXTREMITY ROM: supine active: right flex 108, left 150; supine active-assisted flexion: right 130, left 155  Active ROM Right eval Left eval  Shoulder flexion 97 121  Shoulder extension 48 pain 55  Shoulder abduction 63 83  Shoulder adduction    Shoulder extension    Shoulder internal rotation Right buttock  Radial styloid to L5  Shoulder external rotation 54 88  Elbow flexion    Elbow extension    Wrist  flexion    Wrist extension    Wrist ulnar deviation    Wrist radial deviation    Wrist pronation    Wrist supination     (Blank rows = not tested)  UPPER EXTREMITY MMT:  MMT Right eval Left eval  Shoulder flexion 3- 3-  Shoulder extension    Shoulder abduction 2+ 2+  Shoulder adduction    Shoulder extension    Shoulder internal rotation 3 3  Shoulder external rotation 3 3  Middle trapezius    Lower trapezius    Elbow flexion    Elbow extension    Wrist  flexion    Wrist extension    Wrist ulnar deviation    Wrist radial deviation    Wrist pronation    Wrist supination    Grip strength 23# 16#   (Blank rows = not tested)   TREATMENT DATE:  02/21/24: Supine shoulder flexion dowel Seated scapular retraction Seated OH flexion hands clasped  02/02/24 evaluation                                                                                                                                 PATIENT EDUCATION:  Education details: Educated patient on anatomy and physiology of current symptoms, prognosis, plan of care as well as initial self care strategies to promote recovery Person educated: Patient Education method: Explanation Education comprehension: verbalized understanding  HOME EXERCISE PROGRAM: Access Code: [email or web address] URL: [email or web address] Date: 02/02/2024 Prepared by: Glade Pesa  Exercises - Supine Shoulder Flexion Overhead with Dowel  - 1 x daily - 7 x weekly - 1 sets - 3-5 reps - 10 hold - Seated Scapular Retraction  - 1 x daily - 7 x weekly - 1 sets - 10 reps  ASSESSMENT:  CLINICAL IMPRESSION: First follow up since initial evaluation today.  ***  Eval:Patient is a 53 y.o. female who was seen today for physical therapy evaluation and treatment for neck and shoulder pain.  She denies neck pain but reports she was told her bilateral shoulder and UE pain was referred from the neck.  She reports her ROM has worsened in the last 3 months.  In the seated position her ROM is significantly limited (right > left) in all planes of movement but particularly with elevation right flexion 97, right abduction 63, left flexion 121, left abduction 83.  In supine (gravity-assisted) her ROM is better and less painful: right 108, left 150 degrees.  Limited strength and painful with all resisted movements.  Her grip strength is decreased and below norms for age/gender. She is severely impaired with ADLs especially writing,  reaching shelves and performing some dressing tasks.  She would benefit from PT to address these deficits.   OBJECTIVE IMPAIRMENTS: decreased activity tolerance, decreased ROM, decreased strength, increased fascial restrictions, impaired perceived functional ability, impaired UE functional use, and pain.   ACTIVITY LIMITATIONS: carrying, lifting, sleeping, dressing, reach over head, and hygiene/grooming  PARTICIPATION LIMITATIONS: meal prep, cleaning, laundry, driving, shopping, and community activity  PERSONAL FACTORS: Time since onset of injury/illness/exacerbation and 3+ comorbidities: Diabetes, age/gender putting pt at higher risk of adhesive capsulitis, multiple musculoskeletal/body regions affected are also affecting patient's functional outcome.   REHAB POTENTIAL: Good  CLINICAL DECISION MAKING: Evolving/moderate complexity  EVALUATION COMPLEXITY: Moderate   GOALS: Goals reviewed with patient? Yes  SHORT TERM GOALS: Target date: 03/01/2024    The patient will demonstrate knowledge of basic self care strategies and exercises to promote healing  Baseline:  Goal status: INITIAL  2. The patient will have improved shoulder elevation ROM to at least 125 degrees needed for  reaching high shelves  Baseline:  Goal status: INITIAL  3.  Right and left shoulder internal rotation improved in order to reach behind the back to lumbar region for bathing, dressing and toileting Baseline:  Goal status: INITIAL  4.  Improved grip strength on right to 26# and left to 18# needed for carrying groceries Baseline:  Goal status: INITIAL  5.  PSFS rating with writing improved to 4 Baseline:  Goal status: INITIAL   LONG TERM GOALS: Target date: 03/29/2024   The patient will be independent in a safe self progression of a home exercise program to promote further recovery of function  Baseline:  Goal status: INITIAL  2.  The patient will have improved shoulder elevation ROM to at least 150  degrees needed for grooming/dressing purposes as well as reaching high shelves Baseline:  Goal status: INITIAL  3.  The patient will have grossly 4/5 strength needed to lift and lower a 3# object from a high shelf  Baseline:  Goal status: INITIAL  4.  PSFS rating with reaching behind the back for dressing improved to 4 Baseline:  Goal status: INITIAL  5.  Quick DASH improved to     68 % indicating improved function with less pain Baseline:  Goal status: INITIAL     PLAN:  PT FREQUENCY: 2x/week  PT DURATION: 8 weeks  PLANNED INTERVENTIONS: 97164- PT Re-evaluation, 97110-Therapeutic exercises, 97530- Therapeutic activity, 97112- Neuromuscular re-education, 97535- Self Care, [id number]- Manual therapy, [phone]- Aquatic Therapy, [phone]- Electrical stimulation (unattended), [phone]- Electrical stimulation (manual), [id number]- Ultrasound, [id number]- Traction (mechanical), [id number]- Ionotophoresis 4mg /ml Dexamethasone , 79439 (1-2 muscles), 20561 (3+ muscles)- Dry Needling, Patient/Family education, Taping, Joint mobilization, Spinal mobilization, Cryotherapy, and Moist heat  PLAN FOR NEXT SESSION: shoulder ROM bilaterally active- assistive and active; graded exposure to strengthening shoulder, grip strengthening, manual therapy including joint mobs; modalities and taping as needed for pain control  Mliss Cummins, PT  02/20/24 8:24 PM Phone: [phone] Fax: [phone]

## 2024-02-21 ENCOUNTER — Ambulatory Visit: Payer: MEDICAID | Admitting: Physical Therapy

## 2024-02-21 ENCOUNTER — Encounter: Payer: Self-pay | Admitting: Physical Therapy

## 2024-02-21 DIAGNOSIS — M25511 Pain in right shoulder: Secondary | ICD-10-CM

## 2024-02-21 DIAGNOSIS — M25612 Stiffness of left shoulder, not elsewhere classified: Secondary | ICD-10-CM

## 2024-02-21 DIAGNOSIS — M25512 Pain in left shoulder: Secondary | ICD-10-CM | POA: Diagnosis present

## 2024-02-21 DIAGNOSIS — M25611 Stiffness of right shoulder, not elsewhere classified: Secondary | ICD-10-CM | POA: Diagnosis present

## 2024-02-21 DIAGNOSIS — M6281 Muscle weakness (generalized): Secondary | ICD-10-CM

## 2024-02-28 ENCOUNTER — Ambulatory Visit: Payer: MEDICAID | Admitting: Physical Therapy

## 2024-02-28 NOTE — Therapy (Incomplete)
 OUTPATIENT PHYSICAL THERAPY CERVICAL TREATMENT NOTE   Patient Name: Lynn Martinez MRN: [id number] DOB:03-04-1971, 53 y.o., female Today's Date: 02/28/2024  END OF SESSION:     Past Medical History:  Diagnosis Date   Allergy    Anemia    Anxiety    Arthritis    knees (09/30/2015)   Bipolar disorder (HCC)    Depression    Diabetes mellitus without complication (HCC)    Fibroid    s/p myomectomy 2010, hysterectomy 2013   GERD (gastroesophageal reflux disease)    Grave's disease    Heart murmur    Hormone disorder    Hyperlipidemia    Hypertension    Hypothyroidism    Memory loss    brain Fog per pt related to thyroid  condition   Migraine     otc med prn   PCOS (polycystic ovarian syndrome)    PMS (premenstrual syndrome)    PTSD (post-traumatic stress disorder)    Seasonal allergies    Thyroid  cancer (HCC) 2005   PAST HX   Thyroid  disease    Type II diabetes mellitus (HCC)    Vertigo    Past Surgical History:  Procedure Laterality Date   ABDOMINAL HYSTERECTOMY  10/11/2011   Procedure: HYSTERECTOMY ABDOMINAL;  Surgeon: Curlee VEAR Guan, MD;  Location: [facility];  Service: Gynecology;  Laterality: N/A;  With Repair of serosa.   ABDOMINAL HYSTERECTOMY     CHOLECYSTECTOMY N/A 09/06/2013   Procedure: LAPAROSCOPIC CHOLECYSTECTOMY WITH INTRAOPERATIVE CHOLANGIOGRAM;  Surgeon: Deward GORMAN Curvin DOUGLAS, MD;  Location: [facility];  Service: General;  Laterality: N/A;   COLONOSCOPY     DIAGNOSTIC LAPAROSCOPY     DILATION AND CURETTAGE OF UTERUS     HERNIA REPAIR     LAPAROSCOPIC APPENDECTOMY N/A 04/10/2021   Procedure: APPENDECTOMY LAPAROSCOPIC;  Surgeon: Sheldon Standing, MD;  Location: [facility];  Service: General;  Laterality: N/A;   MYOMECTOMY  06/13/2008   LAPAROSCOPY   thyroid  removed     TOTAL THYROIDECTOMY  06/13/2004   UMBILICAL HERNIA REPAIR  06/13/1980   Patient Active Problem List   Diagnosis Date Noted   Long term current use of oral hypoglycemic drug  02/07/2023   PAD (peripheral artery disease) (HCC) 07/12/2022   Hyperlipidemia associated with type 2 diabetes mellitus (HCC) 07/12/2022   Pain in both feet 12/06/2021   Generalized anxiety disorder 06/21/2021   Nightmare 06/21/2021   Overactive bladder 08/20/2020   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   Heme positive stool 07/15/2020   Morbid obesity with BMI of 40.0-44.9, adult (HCC) 07/09/2020   Bipolar 1 disorder, depressed, moderate (HCC) 02/10/2020   Breast pain, left 06/11/2019   Radiculopathy, cervical region 11/21/2018   Carpal tunnel syndrome, right upper limb 10/08/2018   Carpal tunnel syndrome, left upper limb 10/08/2018   Hypertension 09/30/2015   Type 2 diabetes mellitus with hyperglycemia, with long-term current use of insulin  (HCC) 09/30/2015   S/P cholecystectomy 09/05/2013   Borderline personality disorder (HCC) 03/14/2012    Class: Chronic   OCD (obsessive compulsive disorder) 03/14/2012    Class: Chronic   PTSD (post-traumatic stress disorder) 03/14/2012    Class: Chronic   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    Class: Chronic   Lumbago without sciatica 03/12/2012   Major depression, recurrent (HCC) 03/08/2012    Class: Chronic   Snoring 12/02/2011   PCOS (polycystic ovarian syndrome) 09/05/2011   Hypothyroidism 08/13/2010   Vitamin D  deficiency 08/13/2010   ANEMIA-NOS 08/13/2010    PCP: Newlin, Enobong  MD  REFERRING PROVIDER: Delores Army POUR PA-C  REFERRING DIAG: neck and shoulder pain  THERAPY DIAG: ncck pain; shoulder pain;weakness  Rationale for Evaluation and Treatment: Rehabilitation  ONSET DATE: off/on since January but worsened, last 3 months shoulder stiffness  SUBJECTIVE:                                                                                                                                                                                                         SUBJECTIVE STATEMENT: ***  Hand dominance: Right  PERTINENT HISTORY:   Husband recently passed away 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 Yes NPRS scale: /10 Pain location: bil anterior shoulders; shooting pain to fingertips right worse than left Pain orientation: Bilateral  PAIN TYPE: aching, burning, and shooting Pain description: constant  Aggravating factors: mornings; all ranges of movement with the shoulders bring on pain and sharpness in arms (no neck movements cause the pain) Relieving factors: rest   PRECAUTIONS: None   WEIGHT BEARING RESTRICTIONS: No  FALLS:  Has patient fallen in last 6 months? 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  DIAGNOSTIC FINDINGS:  X-ray cervical =bone spurs   PATIENT SURVEYS: Quick DASH= 81.8% indicating severe to extreme difficulty with ADLs  The Patient-Specific Functional Scale  Initial:  I am going to ask you to identify up to 3 important activities that you are unable to do or are having difficulty with as a result of this problem.  Today are there any activities that you are unable to do or having difficulty with because of this?  (Patient shown scale and patient rated each activity)  Follow up: When you first came in you had difficulty performing these activities.  Today do you still have difficulty?  Patient-Specific activity scoring scheme (Point to one number):  0 1 2 3 4 5 6 7 8 9  10 Unable  Able to perform To perform                                                                                                     activity at the same Activity         Level as before                                                                                                                       Injury or problem  Activity        writing                                                                         Initial:        3                2.      Reaching behind back to put on a bra                       Initial:         1-2      3. Reaching shelves and into the fridge                              Initial: 3                            COGNITION: Overall cognitive status: Within functional limits for tasks assessed  POSTURE:  moderately rounded shoulders  CERVICAL ROM:   Active ROM A/PROM (deg) eval  Flexion WFLs  Extension 40  Right lateral flexion 30  Left lateral flexion 30  Right rotation   Left rotation    (Blank rows = not tested)  UPPER EXTREMITY ROM: supine active: right flex 108, left 150; supine active-assisted flexion: right 130, left 155  Active ROM Right eval Left eval  Shoulder flexion 97 121  Shoulder extension 48 pain 55  Shoulder abduction 63 83  Shoulder adduction    Shoulder extension    Shoulder internal rotation Right buttock  Radial styloid to L5  Shoulder external rotation 54 88  Elbow flexion    Elbow extension  Wrist flexion    Wrist extension    Wrist ulnar deviation    Wrist radial deviation    Wrist pronation    Wrist supination     (Blank rows = not tested)  UPPER EXTREMITY MMT:  MMT Right eval Left eval  Shoulder flexion 3- 3-  Shoulder extension    Shoulder abduction 2+ 2+  Shoulder adduction    Shoulder extension    Shoulder internal rotation 3 3  Shoulder external rotation 3 3  Middle trapezius    Lower trapezius    Elbow flexion    Elbow extension    Wrist flexion    Wrist extension    Wrist ulnar deviation    Wrist radial deviation    Wrist pronation    Wrist supination    Grip  strength 23# 16#   (Blank rows = not tested)   02/21/24 negative ULTT   TREATMENT DATE:  02/28/24: Seated pulleys: flexion and scaption x 2 min ea Seated OH flexion hands clasped too painful Attempted doorway stretches - too painful Noodle along spine standing: scapular retraction 5 sec hold x 10, ER x 10, W x 10 Standing chin tuck x 10 Supine chin tuck x 10 Supine shoulder flexion noodle x 10  Supine horizontal ABD red x 10, diagonals x 10 B Standing rows and ext 2x10 ea green Grip: full finger flexion with mesh circle, digiflex 3# full finger and individual; ball squeeze with black gel ball   02/21/24: Seated pulleys: flexion and scaption x 2 min ea Seated OH flexion hands clasped too painful Attempted doorway stretches - too painful Noodle along spine standing: scapular retraction 5 sec hold x 10, ER x 10, W x 10 Standing chin tuck x 10 Supine chin tuck x 10 Supine shoulder flexion noodle x 10  Supine horizontal ABD red x 10, diagonals x 10 B Standing rows and ext 2x10 ea green Grip: full finger flexion with mesh circle, digiflex 3# full finger and individual; ball squeeze with black gel ball HEP updated   02/02/24 evaluation                                                                                                                                 PATIENT EDUCATION:  Education details: Educated patient on anatomy and physiology of current symptoms, prognosis, plan of care as well as initial self care strategies to promote recovery Person educated: Patient Education method: Explanation Education comprehension: verbalized understanding  HOME EXERCISE PROGRAM: Access Code: [email or web address] URL: [email or web address] Date: 02/21/2024 Prepared by: Mliss  Exercises - Supine Shoulder Flexion Overhead with Dowel  - 1 x daily - 7 x weekly - 1 sets - 3-5 reps - 10 hold - Seated Scapular Retraction  - 1 x daily - 7 x weekly - 1 sets - 10 reps - Supine Chin Tucks on  Flat Ball  - 1 x daily - 7 x weekly - 2 sets -  10 reps - Seated Cervical Retraction  - 2 x daily - 7 x weekly - 1 sets - 10 reps - 5 hold - Standing Shoulder Row with Anchored Resistance  - 1 x daily - 4 x weekly - 1-3 sets - 10 reps - Shoulder Extension with Resistance  - 1 x daily - 7 x weekly - 1-3 sets - 10 reps  ASSESSMENT:  CLINICAL IMPRESSION: ***  Eval:Patient is a 53 y.o. female who was seen today for physical therapy evaluation and treatment for neck and shoulder pain.  She denies neck pain but reports she was told her bilateral shoulder and UE pain was referred from the neck.  She reports her ROM has worsened in the last 3 months.  In the seated position her ROM is significantly limited (right > left) in all planes of movement but particularly with elevation right flexion 97, right abduction 63, left flexion 121, left abduction 83.  In supine (gravity-assisted) her ROM is better and less painful: right 108, left 150 degrees.  Limited strength and painful with all resisted movements.  Her grip strength is decreased and below norms for age/gender. She is severely impaired with ADLs especially writing, reaching shelves and performing some dressing tasks.  She would benefit from PT to address these deficits.   OBJECTIVE IMPAIRMENTS: decreased activity tolerance, decreased ROM, decreased strength, increased fascial restrictions, impaired perceived functional ability, impaired UE functional use, and pain.   ACTIVITY LIMITATIONS: carrying, lifting, sleeping, dressing, reach over head, and hygiene/grooming  PARTICIPATION LIMITATIONS: meal prep, cleaning, laundry, driving, shopping, and community activity  PERSONAL FACTORS: Time since onset of injury/illness/exacerbation and 3+ comorbidities: Diabetes, age/gender putting pt at higher risk of adhesive capsulitis, multiple musculoskeletal/body regions affected are also affecting patient's functional outcome.   REHAB POTENTIAL: Good  CLINICAL  DECISION MAKING: Evolving/moderate complexity  EVALUATION COMPLEXITY: Moderate   GOALS: Goals reviewed with patient? Yes  SHORT TERM GOALS: Target date: 03/01/2024    The patient will demonstrate knowledge of basic self care strategies and exercises to promote healing  Baseline:  Goal status: INITIAL  2. The patient will have improved shoulder elevation ROM to at least 125 degrees needed for  reaching high shelves  Baseline:  Goal status: INITIAL  3.  Right and left shoulder internal rotation improved in order to reach behind the back to lumbar region for bathing, dressing and toileting Baseline:  Goal status: INITIAL  4.  Improved grip strength on right to 26# and left to 18# needed for carrying groceries Baseline:  Goal status: INITIAL  5.  PSFS rating with writing improved to 4 Baseline:  Goal status: INITIAL   LONG TERM GOALS: Target date: 03/29/2024   The patient will be independent in a safe self progression of a home exercise program to promote further recovery of function  Baseline:  Goal status: INITIAL  2.  The patient will have improved shoulder elevation ROM to at least 150 degrees needed for grooming/dressing purposes as well as reaching high shelves Baseline:  Goal status: INITIAL  3.  The patient will have grossly 4/5 strength needed to lift and lower a 3# object from a high shelf  Baseline:  Goal status: INITIAL  4.  PSFS rating with reaching behind the back for dressing improved to 4 Baseline:  Goal status: INITIAL  5.  Quick DASH improved to     68 % indicating improved function with less pain Baseline:  Goal status: INITIAL     PLAN:  PT FREQUENCY:  2x/week  PT DURATION: 8 weeks  PLANNED INTERVENTIONS: 97164- PT Re-evaluation, 97110-Therapeutic exercises, 97530- Therapeutic activity, 97112- Neuromuscular re-education, 97535- Self Care, 02859- Manual therapy, [phone]- Aquatic Therapy, [phone]- Electrical stimulation (unattended), [phone]-  Electrical stimulation (manual), [id number]- Ultrasound, [id number]- Traction (mechanical), [id number]- Ionotophoresis 4mg /ml Dexamethasone , 79439 (1-2 muscles), 20561 (3+ muscles)- Dry Needling, Patient/Family education, Taping, Joint mobilization, Spinal mobilization, Cryotherapy, and Moist heat  PLAN FOR NEXT SESSION: shoulder ROM bilaterally active- assistive and active; graded exposure to strengthening shoulder, grip strengthening, manual therapy including joint mobs; modalities and taping as needed for pain control  Mliss Cummins, PT  02/28/24 7:53 AM Phone: [phone] Fax: [phone]

## 2024-03-01 ENCOUNTER — Other Ambulatory Visit (HOSPITAL_COMMUNITY): Payer: Self-pay

## 2024-03-01 ENCOUNTER — Telehealth: Payer: Self-pay

## 2024-03-01 NOTE — Telephone Encounter (Signed)
 Pharmacy Patient Advocate Encounter  Received notification from [facility] MEDICAID that Prior Authorization for Ozempic  (1 MG/DOSE) 4MG /3ML pen-injectors has been APPROVED from 03/01/24 to 03/01/2025

## 2024-03-01 NOTE — Telephone Encounter (Signed)
 Pharmacy Patient Advocate Encounter   Received notification from CoverMyMeds that prior authorization for Ozempic  (1 MG/DOSE) 4MG /3ML pen-injectors is required/requested.   Insurance verification completed.   The patient is insured through [facility] MEDICAID .   Per test claim: PA required; PA submitted to above mentioned insurance via Latent Key/confirmation #/EOC [id number] Status is pending

## 2024-03-04 ENCOUNTER — Other Ambulatory Visit: Payer: Self-pay | Admitting: Critical Care Medicine

## 2024-03-04 DIAGNOSIS — G43909 Migraine, unspecified, not intractable, without status migrainosus: Secondary | ICD-10-CM

## 2024-03-05 ENCOUNTER — Other Ambulatory Visit: Payer: Self-pay | Admitting: Family Medicine

## 2024-03-05 MED ORDER — BENZOYL PEROXIDE-ERYTHROMYCIN 5-3 % EX GEL: Freq: Two times a day (BID) | CUTANEOUS | 1 refills | Status: AC

## 2024-03-05 NOTE — Telephone Encounter (Signed)
 Requested Prescriptions  Pending Prescriptions Disp Refills   propranolol  (INDERAL ) 40 MG tablet [Pharmacy Med Name: PROPRANOLOL  40MG  TAB 40 Tablet] 60 tablet 11    Sig: TAKE ONE (1) TABLET BY MOUTH TWICE DAILY     Cardiovascular:  Beta Blockers Passed - 03/05/2024  3:36 PM      Passed - Last BP in normal range    BP Readings from Last 1 Encounters:  02/07/24 116/80         Passed - Last Heart Rate in normal range    Pulse Readings from Last 1 Encounters:  02/07/24 78         Passed - Valid encounter within last 6 months    Recent Outpatient Visits           3 weeks ago Infertility associated with anovulation   [facility] [place] Delbert Clam, MD   2 months ago Type 2 diabetes mellitus with hyperglycemia, with long-term current use of insulin  [facility])   [facility] [place]. [facility] Delbert Clam, MD   8 months ago Type 2 diabetes mellitus with hyperglycemia, with long-term current use of insulin  [facility])   [facility] [place]. [facility] Brien Belvie BRAVO, MD   1 year ago Primary hypertension   [facility] [place]. [facility] Brien Belvie BRAVO, MD   1 year ago Type 2 diabetes mellitus with hyperglycemia, with long-term current use of insulin  [facility])   [facility] [place]. [facility] Brien Belvie BRAVO, MD       Future Appointments             In 4 months Delbert Clam, MD [facility] [place] - [facility] Dept Of [place]. [facility] [place]

## 2024-03-06 ENCOUNTER — Ambulatory Visit: Payer: MEDICAID | Admitting: Physical Therapy

## 2024-03-06 ENCOUNTER — Telehealth: Payer: Self-pay | Admitting: Physical Therapy

## 2024-03-06 NOTE — Telephone Encounter (Signed)
 Called and left message regarding no-show for appt this morning.  Reminded of facility attendance policy and requested for patient to call the facility regarding continuation or discharge from PT

## 2024-03-12 ENCOUNTER — Other Ambulatory Visit: Payer: Self-pay

## 2024-03-20 ENCOUNTER — Ambulatory Visit: Payer: MEDICAID | Admitting: Gastroenterology

## 2024-03-20 ENCOUNTER — Encounter: Payer: Self-pay | Admitting: Physical Therapy

## 2024-03-20 ENCOUNTER — Encounter: Payer: Self-pay | Admitting: Gastroenterology

## 2024-03-20 ENCOUNTER — Ambulatory Visit: Payer: MEDICAID | Attending: Surgical | Admitting: Physical Therapy

## 2024-03-20 VITALS — BP 106/70 | HR 84 | Ht 66.0 in | Wt 246.5 lb

## 2024-03-20 DIAGNOSIS — M25511 Pain in right shoulder: Secondary | ICD-10-CM | POA: Diagnosis present

## 2024-03-20 DIAGNOSIS — Z800 Family history of malignant neoplasm of digestive organs: Secondary | ICD-10-CM

## 2024-03-20 DIAGNOSIS — Z860101 Personal history of adenomatous and serrated colon polyps: Secondary | ICD-10-CM | POA: Diagnosis not present

## 2024-03-20 DIAGNOSIS — Z860100 Personal history of colon polyps, unspecified: Secondary | ICD-10-CM

## 2024-03-20 DIAGNOSIS — M25612 Stiffness of left shoulder, not elsewhere classified: Secondary | ICD-10-CM | POA: Diagnosis present

## 2024-03-20 DIAGNOSIS — M6281 Muscle weakness (generalized): Secondary | ICD-10-CM | POA: Diagnosis present

## 2024-03-20 DIAGNOSIS — M25611 Stiffness of right shoulder, not elsewhere classified: Secondary | ICD-10-CM | POA: Diagnosis present

## 2024-03-20 DIAGNOSIS — M25512 Pain in left shoulder: Secondary | ICD-10-CM | POA: Diagnosis present

## 2024-03-20 NOTE — Therapy (Signed)
 OUTPATIENT PHYSICAL THERAPY CERVICAL TREATMENT AND RECERTIFICATION   Patient Name: Lynn Martinez MRN: [id number] DOB:March 26, 1971, 53 y.o., female Today's Date: 03/20/2024  END OF SESSION:  PT End of Session - 03/20/24 1408     Visit Number 3    Date for Recertification  05/27/24    Authorization Type [employer]  RE-AUTH PENDING (requested 16 vistis 10/20 to 12/15)    Authorization Time Period 8/22 to 10/17    Authorization - Visit Number 2    Authorization - Number of Visits 8    PT Start Time 1410    PT Stop Time 1445    PT Time Calculation (min) 35 min    Activity Tolerance Patient tolerated treatment well    Behavior During Therapy [facility] for tasks assessed/performed            Past Medical History:  Diagnosis Date   Allergy    Anemia    Anxiety    Arthritis    knees (09/30/2015)   Bipolar disorder (HCC)    Depression    Diabetes mellitus without complication (HCC)    Fibroid    s/p myomectomy 2010, hysterectomy 2013   GERD (gastroesophageal reflux disease)    Grave's disease    Heart murmur    Hormone disorder    Hyperlipidemia    Hypertension    Hypothyroidism    Memory loss    brain Fog per pt related to thyroid  condition   Migraine     otc med prn   PCOS (polycystic ovarian syndrome)    PMS (premenstrual syndrome)    PTSD (post-traumatic stress disorder)    Seasonal allergies    Thyroid  cancer (HCC) 2005   PAST HX   Thyroid  disease    Type II diabetes mellitus (HCC)    Vertigo    Past Surgical History:  Procedure Laterality Date   ABDOMINAL HYSTERECTOMY  10/11/2011   Procedure: HYSTERECTOMY ABDOMINAL;  Surgeon: Curlee VEAR Guan, MD;  Location: [facility];  Service: Gynecology;  Laterality: N/A;  With Repair of serosa.   APPENDECTOMY     CHOLECYSTECTOMY N/A 09/06/2013   Procedure: LAPAROSCOPIC CHOLECYSTECTOMY WITH INTRAOPERATIVE CHOLANGIOGRAM;  Surgeon: Deward GORMAN Curvin DOUGLAS, MD;  Location: [facility];  Service: General;  Laterality: N/A;    COLONOSCOPY     DIAGNOSTIC LAPAROSCOPY     DILATION AND CURETTAGE OF UTERUS     LAPAROSCOPIC APPENDECTOMY N/A 04/10/2021   Procedure: APPENDECTOMY LAPAROSCOPIC;  Surgeon: Sheldon Standing, MD;  Location: [facility];  Service: General;  Laterality: N/A;   MYOMECTOMY  06/13/2008   LAPAROSCOPY   TOTAL THYROIDECTOMY  06/13/2004   UMBILICAL HERNIA REPAIR  06/13/1980   Patient Active Problem List   Diagnosis Date Noted   Long term current use of oral hypoglycemic drug 02/07/2023   PAD (peripheral artery disease) 07/12/2022   Hyperlipidemia associated with type 2 diabetes mellitus (HCC) 07/12/2022   Pain in both feet 12/06/2021   Generalized anxiety disorder 06/21/2021   Nightmare 06/21/2021   Overactive bladder 08/20/2020   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   Heme positive stool 07/15/2020   Morbid obesity with BMI of 40.0-44.9, adult (HCC) 07/09/2020   Bipolar 1 disorder, depressed, moderate (HCC) 02/10/2020   Breast pain, left 06/11/2019   Radiculopathy, cervical region 11/21/2018   Carpal tunnel syndrome, right upper limb 10/08/2018   Carpal tunnel syndrome, left upper limb 10/08/2018   Hypertension 09/30/2015   Type 2 diabetes mellitus with hyperglycemia, with long-term current use of insulin  (HCC) 09/30/2015   S/P  cholecystectomy 09/05/2013   Borderline personality disorder (HCC) 03/14/2012    Class: Chronic   OCD (obsessive compulsive disorder) 03/14/2012    Class: Chronic   PTSD (post-traumatic stress disorder) 03/14/2012    Class: Chronic   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    Class: Chronic   Lumbago without sciatica 03/12/2012   Major depression, recurrent 03/08/2012    Class: Chronic   Snoring 12/02/2011   PCOS (polycystic ovarian syndrome) 09/05/2011   Hypothyroidism 08/13/2010   Vitamin D  deficiency 08/13/2010   ANEMIA-NOS 08/13/2010    PCP: Delbert Clam MD  REFERRING PROVIDER: Delores Army POUR PA-C  REFERRING DIAG: neck and shoulder pain  THERAPY DIAG: ncck  pain; shoulder pain;weakness  Rationale for Evaluation and Treatment: Rehabilitation  ONSET DATE: off/on since January but worsened, last 3 months shoulder stiffness  SUBJECTIVE:                                                                                                                                                                                                         SUBJECTIVE STATEMENT: Reaching still hurts. The steroids helped a little bit.   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 Yes NPRS scale:6-7 /10 Pain location: bil anterior shoulders; shooting pain to fingertips right worse than left Pain orientation: Bilateral  PAIN TYPE: aching, burning, and shooting Pain description: constant  Aggravating factors: mornings; all ranges of movement with the shoulders bring on pain and sharpness in arms (no neck movements cause the pain) Relieving factors: rest   PRECAUTIONS: None   WEIGHT BEARING RESTRICTIONS: No  FALLS:  Has patient fallen in last 6 months? 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  DIAGNOSTIC FINDINGS:  X-ray cervical =bone spurs   PATIENT SURVEYS: Quick DASH= 81.8% indicating severe to extreme difficulty with ADLs  The Patient-Specific Functional Scale  Initial:  I am going to ask you to identify up to 3 important activities that you are unable to do or are having difficulty with as a result of this problem.  Today are there any  activities that you are  unable to do or having difficulty with because of this?  (Patient shown scale and patient rated each activity)  Follow up: When you first came in you had difficulty performing these activities.  Today do you still have difficulty?  Patient-Specific activity scoring scheme (Point to one number):  0 1 2 3 4 5 6 7 8 9  10 Unable                                                                                                          Able to perform To perform                                                                                                    activity at the same Activity         Level as before                                                                                                                       Injury or problem  Activity        writing                                                                         Initial:        3                2.      Reaching behind back to put on a bra                       Initial:         1-2      3. Reaching shelves and into the fridge  Initial: 3                            COGNITION: Overall cognitive status: Within functional limits for tasks assessed  POSTURE:  moderately rounded shoulders  CERVICAL ROM:   Active ROM A/PROM (deg) eval  Flexion WFLs  Extension 40  Right lateral flexion 30  Left lateral flexion 30  Right rotation   Left rotation    (Blank rows = not tested)  UPPER EXTREMITY ROM: supine active: right flex 108, left 150; supine active-assisted flexion: right 130, left 155  03/20/24  Supine Flex R 130   L 155  Active ROM Right eval Left eval Right 03/21/24 Left 03/21/24  Shoulder flexion 97 121 125 122  Shoulder extension 48 pain 55    Shoulder abduction 63 83    Shoulder adduction      Shoulder extension      Shoulder internal rotation Right buttock  Radial styloid to L5    Shoulder external rotation 54 88    Elbow flexion      Elbow extension      Wrist  flexion      Wrist extension      Wrist ulnar deviation      Wrist radial deviation      Wrist pronation      Wrist supination       (Blank rows = not tested)  UPPER EXTREMITY MMT:  MMT Right eval Left eval  Shoulder flexion 3- 3-  Shoulder extension    Shoulder abduction 2+ 2+  Shoulder adduction    Shoulder extension    Shoulder internal rotation 3 3  Shoulder external rotation 3 3  Middle trapezius    Lower trapezius    Elbow flexion    Elbow extension    Wrist flexion    Wrist extension    Wrist ulnar deviation    Wrist radial deviation    Wrist pronation    Wrist supination    Grip strength 23# 16#   (Blank rows = not tested)   02/21/24 negative ULTT   TREATMENT DATE:    03/20/24: Seated posture correction Putty issued - red  Self care: shoulder heat brace donned for patient Chin tucks x 6 pt reports pain in L scapula and tingling in R arm Seated horizontal ABD green - too difficult Supine horizontal ABD green 2x10 Supine diagonal pull down 2x 10 with opp arm stabilizing at 90 deg flexion ROM measured Standing flex, scaption and ABD x 10 ea B Standing rows and ext 2x10 ea green    02/21/24:  Seated pulleys: flexion and scaption x 2 min ea Seated OH flexion hands clasped too painful Attempted doorway stretches - too painful Noodle along spine standing: scapular retraction 5 sec hold x 10, ER x 10, W x 10 Standing chin tuck x 10 Supine chin tuck x 10 Supine shoulder flexion noodle x 10  Supine horizontal ABD red x 10, diagonals x 10 B Standing rows and ext 2x10 ea green Grip: full finger flexion with mesh circle, digiflex 3# full finger and individual; ball squeeze with black gel ball HEP updated   02/02/24 evaluation  PATIENT EDUCATION:  Education details: Educated patient on anatomy and physiology of current symptoms,  prognosis, plan of care as well as initial self care strategies to promote recovery Person educated: Patient Education method: Explanation Education comprehension: verbalized understanding  HOME EXERCISE PROGRAM: Access Code: [email or web address] URL: [email or web address] Date: 03/20/2024 Prepared by: Mliss  Exercises - Supine Shoulder Flexion Overhead with Dowel  - 1 x daily - 7 x weekly - 1 sets - 3-5 reps - 10 hold - Seated Scapular Retraction  - 1 x daily - 7 x weekly - 1 sets - 10 reps - Supine Chin Tucks on Flat Ball  - 1 x daily - 7 x weekly - 2 sets - 10 reps - Seated Cervical Retraction  - 2 x daily - 7 x weekly - 1 sets - 10 reps - 5 hold - Standing Shoulder Row with Anchored Resistance  - 1 x daily - 4 x weekly - 1-3 sets - 10 reps - Shoulder Extension with Resistance  - 1 x daily - 7 x weekly - 1-3 sets - 10 reps - Standing Shoulder External Rotation with Resistance  - 1 x daily - 3 x weekly - 2 sets - 10 reps - Supine Shoulder Horizontal Abduction with Resistance  - 1 x daily - 3 x weekly - 2 sets - 10 reps - Standing Shoulder Flexion to 90 Degrees  - 1 x daily - 3 x weekly - 1-3 sets - 10 reps - 3 sec hold - Standing Shoulder Scaption  - 1 x daily - 3 x weekly - 1-3 sets - 10 reps - 3 sec hold - Shoulder Abduction - Thumbs Up  - 1 x daily - 3 x weekly - 1-3 sets - 10 reps - 3 sec hold  ASSESSMENT:  CLINICAL IMPRESSION: Patient presents today for her 2nd treatment session after a 4-week break from PT due to illness. She reports the same level of pain with ADLs and same functional limitations. She has been compliant with her HEP and shows improvement in her shoulder flexion bilaterally in sitting and standing (see objective). She was able to tolerate more strengthening today than at last session. Some PRE has to be done in supine due to pain. Issued putty (medium) today for grip strength which was challenging, but not painful. Patient continues to demonstrate potential for  improvement and would benefit from continued skilled therapy to address impairments. I recommend 2x/wk for 8 weeks due to lack of treatment during first POC. Sending re-cert today as patient's next appointment is scheduled outside of her current POC.    Eval:Patient is a 53 y.o. female who was seen today for physical therapy evaluation and treatment for neck and shoulder pain.  She denies neck pain but reports she was told her bilateral shoulder and UE pain was referred from the neck.  She reports her ROM has worsened in the last 3 months.  In the seated position her ROM is significantly limited (right > left) in all planes of movement but particularly with elevation right flexion 97, right abduction 63, left flexion 121, left abduction 83.  In supine (gravity-assisted) her ROM is better and less painful: right 108, left 150 degrees.  Limited strength and painful with all resisted movements.  Her grip strength is decreased and below norms for age/gender. She is severely impaired with ADLs especially writing, reaching shelves and performing some dressing tasks.  She would benefit from PT to address these deficits.   OBJECTIVE IMPAIRMENTS: decreased activity tolerance,  decreased ROM, decreased strength, increased fascial restrictions, impaired perceived functional ability, impaired UE functional use, and pain.   ACTIVITY LIMITATIONS: carrying, lifting, sleeping, dressing, reach over head, and hygiene/grooming  PARTICIPATION LIMITATIONS: meal prep, cleaning, laundry, driving, shopping, and community activity  PERSONAL FACTORS: Time since onset of injury/illness/exacerbation and 3+ comorbidities: Diabetes, age/gender putting pt at higher risk of adhesive capsulitis, multiple musculoskeletal/body regions affected are also affecting patient's functional outcome.   REHAB POTENTIAL: Good  CLINICAL DECISION MAKING: Evolving/moderate complexity  EVALUATION COMPLEXITY: Moderate   GOALS: Goals reviewed with  patient? Yes  SHORT TERM GOALS: Target date: 03/01/2024    The patient will demonstrate knowledge of basic self care strategies and exercises to promote healing  Baseline:  Goal status: IN PROGRESS  2. The patient will have improved shoulder elevation ROM to at least 125 degrees needed for  reaching high shelves  Baseline:  Goal status: PARTIALLY MET 03/20/24  3.  Right and left shoulder internal rotation improved in order to reach behind the back to lumbar region for bathing, dressing and toileting Baseline:  Goal status: IN PROGRESS  4.  Improved grip strength on right to 26# and left to 18# needed for carrying groceries Baseline:  Goal status: IN PROGRESS  5.  PSFS rating with writing improved to 4 Baseline:  Goal status: IN PROGRESS   LONG TERM GOALS: Target date: 05/27/24   The patient will be independent in a safe self progression of a home exercise program to promote further recovery of function  Baseline:  Goal status: INITIAL  2.  The patient will have improved shoulder elevation ROM to at least 150 degrees needed for grooming/dressing purposes as well as reaching high shelves Baseline:  Goal status: INITIAL  3.  The patient will have grossly 4/5 strength needed to lift and lower a 3# object from a high shelf  Baseline:  Goal status: INITIAL  4.  PSFS rating with reaching behind the back for dressing improved to 4 Baseline:  Goal status: INITIAL  5.  Quick DASH improved to     68 % indicating improved function with less pain Baseline:  Goal status: INITIAL     PLAN:  PT FREQUENCY: 2x/week  PT DURATION: 8 weeks  PLANNED INTERVENTIONS: 97164- PT Re-evaluation, 97110-Therapeutic exercises, 97530- Therapeutic activity, 97112- Neuromuscular re-education, 97535- Self Care, [id number]- Manual therapy, [phone]- Aquatic Therapy, [phone]- Electrical stimulation (unattended), [phone]- Electrical stimulation (manual), [id number]- Ultrasound, [id number]- Traction (mechanical), [id number]-  Ionotophoresis 4mg /ml Dexamethasone , 79439 (1-2 muscles), 20561 (3+ muscles)- Dry Needling, Patient/Family education, Taping, Joint mobilization, Spinal mobilization, Cryotherapy, and Moist heat  PLAN FOR NEXT SESSION: Check status of re-auth; shoulder ROM bilaterally active- assistive and active; graded exposure to strengthening shoulder, grip strengthening, manual therapy including joint mobs; modalities and taping as needed for pain control  Mliss Cummins, PT  03/20/24 4:52 PM Phone: [phone] Fax: [phone]

## 2024-03-20 NOTE — Progress Notes (Signed)
 Chief Complaint:Screening for colon cancer  Primary GI Doctor: (previously Dr. Eda) Dr. San  HPI:  Patient is a  53  year old female patient with past medical history of Thyroid  CA, GERD, DM, bipolar disorder, hypertension, who was referred to me by Brien Belvie BRAVO, MD on 01/02/24 for a evaluation of Screening for colon cancer. Patient last seen in the GI office on 09/09/2020 by Dr. Eda.  Interval History    Patent presents for evaluation for colon screening colonoscopy as recommended by her PCP.  Patient denies abdominal pain, rectal bleeding, or altered bowel habits.   Patient denies GERD or dysphagia Patient denies nausea, vomiting.  Her appetite and weight has fluctuated with being on Ozempic .    Nonsmoker. No alcohol use.   No blood thinners.  Surgical history: appendectomy, gallbladder removal, hysterectomy   Patient's family history includes paternal grandfather and maternal grandmother with colon CA, maternal great grandfather colon CA, personal history of thyroid  CA.   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   APPENDECTOMY     CHOLECYSTECTOMY N/A 09/06/2013   Procedure: LAPAROSCOPIC CHOLECYSTECTOMY WITH INTRAOPERATIVE CHOLANGIOGRAM;  Surgeon: Deward GORMAN Curvin DOUGLAS, MD;  Location: [facility];  Service: General;  Laterality: N/A;   COLONOSCOPY     DIAGNOSTIC LAPAROSCOPY     DILATION AND CURETTAGE OF UTERUS     LAPAROSCOPIC APPENDECTOMY N/A 04/10/2021   Procedure: APPENDECTOMY LAPAROSCOPIC;  Surgeon: Sheldon Standing, MD;  Location: [facility];  Service: General;  Laterality: N/A;   MYOMECTOMY  06/13/2008   LAPAROSCOPY   TOTAL THYROIDECTOMY  06/13/2004   UMBILICAL HERNIA REPAIR  06/13/1980    Current Outpatient Medications  Medication Sig Dispense Refill   atorvastatin  (LIPITOR) 10 MG tablet TAKE 1 TABLET BY MOUTH EVERY DAY AT BEDTIME 90 tablet 1   benzoyl peroxide -erythromycin  (BENZAMYCIN) gel Apply topically 2 (two) times daily. 46 g 1   citalopram  (CELEXA ) 40 MG tablet Take 1 tablet (40 mg total) by mouth daily. 90 tablet 2   Clindamycin  Phos-Benzoyl Perox gel Apply 1 Application topically 2 (two) times daily.     diclofenac  Sodium (VOLTAREN ) 1 %  GEL Apply 2 g topically as needed.     FARXIGA  10 MG TABS tablet TAKE 1 TABLET BY MOUTH EVERY DAY BEFORE BREAKFAST *KEEP UPCOMING APPOINTMENT FOR ADDITIONAL REFILLS* 30 tablet 11   gabapentin  (NEURONTIN ) 300 MG capsule TAKE TWO (2) CAPSULES BY MOUTH TWICE DAILY 120 capsule 10   hydrOXYzine  (VISTARIL ) 25 MG capsule Take 25 mg by mouth 3 (three) times daily.     Insulin  Pen Needle (BD PEN NEEDLE MICRO U/F) 32G X 6 MM MISC Use in the morning, at noon, evening, and at bedtime. 400 each 3   levothyroxine  (SYNTHROID ) 150 MCG tablet TAKE 1 TABLET BY MOUTH DAILY 90 tablet 3   lithium  carbonate (ESKALITH ) 450 MG ER tablet TAKE 2 TABLETS BY  MOUTH AT BEDTIME 120 tablet 1   Lurasidone  HCl 120 MG TABS Take 1 tablet (120 mg total) by mouth at bedtime. 30 tablet 2   methocarbamol  (ROBAXIN ) 500 MG tablet Take 1 tablet (500 mg total) by mouth every 6 (six) hours as needed for muscle spasms. 270 tablet 1   Multiple Vitamins-Minerals (ONE-A-DAY WOMENS PO) Take 1 tablet by mouth daily with breakfast.     ofloxacin  (OCUFLOX ) 0.3 % ophthalmic solution Place 1 drop into the left eye 4 (four) times daily. 5 mL 0   omeprazole  (PRILOSEC) 40 MG capsule TAKE 1 CAPSULE (40 MG TOTAL) BY MOUTH DAILY. 90 capsule 3   prazosin  (MINIPRESS ) 1 MG capsule TAKE 1 CAPSULE AT BEDTIME FOR SLEEP/NIGHTMARES 180 capsule 1   propranolol  (INDERAL ) 40 MG tablet TAKE ONE (1) TABLET BY MOUTH TWICE DAILY 60 tablet 3   Semaglutide , 1 MG/DOSE, 4 MG/3ML SOPN Inject 1 mg as directed once a week. (Patient taking differently: Inject 2 mg as directed once a week.) 9 mL 3   Vitamin D , Ergocalciferol , (DRISDOL ) 1.25 MG (50000 UNIT) CAPS capsule Take 1 capsule (50,000 Units total) by mouth every Saturday. 12 capsule 2   No current facility-administered medications for this visit.    Allergies as of 03/20/2024 - Review Complete 03/20/2024  Allergen Reaction Noted   Bee venom Anaphylaxis, Hives, and Shortness Of Breath 09/28/2017   Morphine Anaphylaxis, Itching, and Other (See Comments) 08/13/2010   Tape Itching, Rash, and Other (See Comments) 04/09/2021   Codeine Itching and Other (See Comments) 08/13/2010   Golytely  [peg 3350 -[id number]] Itching and Other (See Comments) 12/07/2020   Ibuprofen Hives, Swelling, and Other (See Comments) 08/13/2010   Metformin and related Other (See Comments) 12/07/2014   Penicillins Other (See Comments) 01/24/2018   Aspirin Hives, Swelling, Rash, and Other (See Comments) 08/13/2010    Family History  Problem Relation Age of Onset   Thyroid  disease Mother    Diabetes Father    Hypertension Father    Heart disease Father    Heart  attack Father    Stomach cancer Maternal Grandmother    Colon cancer Maternal Grandmother    Cancer Maternal Grandmother        LYMPHOMA   Hypertension Maternal Grandmother    Hypertension Maternal Grandfather    Diabetes Maternal Grandfather    Heart disease Maternal Grandfather    Diabetes Paternal Grandmother    Hypertension Paternal Grandmother    Hypertension Paternal Grandfather    Pancreatic cancer Paternal Grandfather    Colon cancer Paternal Grandfather    Cancer Maternal Aunt        BRAIN   Esophageal cancer Neg Hx    Rectal cancer Neg Hx     Review of Systems:  Constitutional: No weight loss, fever, chills, weakness or fatigue HEENT: Eyes: No change in vision               Ears, Nose, Throat:  No change in hearing or congestion Skin: No rash or itching Cardiovascular: No chest pain, chest pressure or palpitations   Respiratory: No SOB or cough Gastrointestinal: See HPI and otherwise negative Genitourinary: No dysuria or change in urinary frequency Neurological: No headache, dizziness or syncope Musculoskeletal: No new muscle or joint pain Hematologic: No bleeding or bruising Psychiatric: No history of depression or anxiety    Physical Exam:  Vital signs: BP 106/70 (BP Location: Left Arm, Patient Position: Sitting, Cuff Size: Large)   Pulse 84   Ht 5' 6 (1.676 m) Comment: height measured without shoes  Wt 246 lb 8 oz (111.8 kg)   LMP 09/20/2011   BMI 39.79 kg/m   Constitutional:   Pleasant  female appears to be in NAD, Well developed, Well nourished, alert and cooperative Throat: Oral cavity and pharynx without inflammation, swelling or lesion.  Respiratory: Respirations even and unlabored. Lungs clear to auscultation bilaterally.   No wheezes, crackles, or rhonchi.  Cardiovascular: Normal S1, S2. Regular rate and rhythm. No peripheral edema, cyanosis or pallor.  Gastrointestinal:  Soft, nondistended, nontender. No rebound or guarding. Normal bowel  sounds. No appreciable masses or hepatomegaly. Rectal:  Not performed.  Msk:  Symmetrical without gross deformities. Without edema, no deformity or joint abnormality.  Neurologic:  Alert and  oriented x4;  grossly normal neurologically.  Skin:   Dry and intact without significant lesions or rashes.  RELEVANT LABS AND IMAGING: CBC    Latest Ref Rng & Units 06/29/2023   12:18 PM 05/12/2021   10:00 AM 04/09/2021   11:55 AM  CBC  WBC 3.4 - 10.8 x10E3/uL 9.3  9.6  8.1   Hemoglobin 11.1 - 15.9 g/dL 86.9  87.2  86.8   Hematocrit 34.0 - 46.6 % 41.4  40.1  41.1   Platelets 150 - 450 x10E3/uL 496  477  460      CMP     Latest Ref Rng & Units 06/29/2023   12:18 PM 12/13/2022   11:36 AM 07/12/2022   10:06 AM  CMP  Glucose 70 - 99 mg/dL 892  824  852   BUN 6 - 24 mg/dL 8  11  13    Creatinine 0.57 - 1.00 mg/dL 9.10  9.20  9.09   Sodium 134 - 144 mmol/L 144  138  137   Potassium 3.5 - 5.2 mmol/L 4.7  4.8  4.5   Chloride 96 - 106 mmol/L 104  100  99   CO2 20 - 29 mmol/L 23  25  22    Calcium  8.7 - 10.2 mg/dL 9.5  9.6  89.7   Total Protein 6.0 - 8.5 g/dL 7.3  7.2  7.3   Total Bilirubin 0.0 - 1.2 mg/dL 0.2  0.2  <9.7   Alkaline Phos 44 - 121 IU/L 69  71  68   AST 0 - 40 IU/L 10  12  13    ALT 0 - 32 IU/L 12  15  12       Lab Results  Component Value Date   TSH 2.24 02/07/2023   12/07/2020 colonoscopy, recall 7 yrs - One 3 mm polyp in the distal ascending colon, removed with a cold snare. Resected and retrieved.  - Tortuous colon. Path: Diagnosis Surgical [P], colon, ascending, polyp (1) - TUBULAR ADENOMA(S) -  NEGATIVE FOR HIGH-GRADE DYSPLASIA OR MALIGNANCY 614//22 colonoscopy - unsuccessful due to poor prep   Assessment: Encounter Diagnoses  Name Primary?   History of colonic polyps Yes   Family history of colon cancer     53 year old female patient that presents for colon screening colonoscopy evaluation.  Had a colonoscopy back in June 2022 and had one 3 mm tubular adenoma.   Recommendations for 7-year recall.  Patient does have second-degree relatives with colon cancer.  No current GI issues or complaints today. No anemia. According to ACG guidelines, we suggest initiating CRC screening with a colonoscopy at age 34 or 10 years before the youngest affected relative, whichever is earlier, for individuals with CRC or advanced polyp in 1 first-degree relative (FDR) at age <60 years or CRC or advanced polyp in >=2 FDR at any age. We suggest interval colonoscopy every 5 years.  Will defer procedures to Dr. San  Plan: -defer procedures to Dr. San  Thank you for the courtesy of this consult. Please call me with any questions or concerns.   Eudelia Hiltunen, FNP-C [facility] 03/20/2024, 10:50 AM  Cc: Brien Belvie BRAVO, MD

## 2024-03-20 NOTE — Patient Instructions (Addendum)
 Will discuss your case with the doctor and someone will contact you to let you know if we plan to schedule procedures. You can call your insurance while waiting to see if it would be covered.    _______________________________________________________  If your blood pressure at your visit was 140/90 or greater, please contact your primary care physician to follow up on this.  _______________________________________________________  If you are age 51 or older, your body mass index should be between 23-30. Your Body mass index is 39.79 kg/m. If this is out of the aforementioned range listed, please consider follow up with your Primary Care Provider.  If you are age 39 or younger, your body mass index should be between 19-25. Your Body mass index is 39.79 kg/m. If this is out of the aformentioned range listed, please consider follow up with your Primary Care Provider.   ________________________________________________________  The [facility] providers would like to encourage you to use MYCHART to communicate with providers for non-urgent requests or questions.  Due to long hold times on the telephone, sending your provider a message by [facility] may be a faster and more efficient way to get a response.  Please allow 48 business hours for a response.  Please remember that this is for non-urgent requests.  _______________________________________________________  [facility] is using a team-based approach to care.  Your team is made up of your doctor and two to three APPS. Our APPS (Nurse Practitioners and Physician Assistants) work with your physician to ensure care continuity for you. They are fully qualified to address your health concerns and develop a treatment plan. They communicate directly with your gastroenterologist to care for you. Seeing the Advanced Practice Practitioners on your physician's team can help you by facilitating care more promptly, often allowing for earlier  appointments, access to diagnostic testing, procedures, and other specialty referrals.   Thank you for trusting me with your gastrointestinal care. Deanna May, FNP-C

## 2024-03-21 ENCOUNTER — Other Ambulatory Visit: Payer: Self-pay | Admitting: Family Medicine

## 2024-03-21 NOTE — Progress Notes (Signed)
 Agree with the assessment and plan as outlined by [facility], FNP-C.  Based on updated family history, can amend colonoscopy recall to 5 years (11/2025).  Corneluis Allston, DO, [facility]

## 2024-03-22 ENCOUNTER — Telehealth: Payer: Self-pay

## 2024-03-22 NOTE — Telephone Encounter (Signed)
-----   Message from Cathryne PARAS May sent at 03/22/2024  9:01 AM EDT ----- Karna- Let patient know dr san recommends colonoscopy 5 years from previous so 2027   Deanna, NP ----- Message ----- From: san Sandor GAILS, DO Sent: 03/21/2024   5:41 PM EDT To: Cathryne PARAS May, NP

## 2024-03-27 NOTE — Therapy (Signed)
 OUTPATIENT PHYSICAL THERAPY CERVICAL TREATMENT AND RECERTIFICATION   Patient Name: Lynn Martinez MRN: [id number] DOB:Oct 06, 1970, 53 y.o., female Today's Date: 03/28/2024  END OF SESSION:  PT End of Session - 03/28/24 1102     Visit Number 4    Date for Recertification  05/27/24    Authorization Type 16 vistis 10/20 to 12/11    Authorization Time Period 04/01/24 to 05/27/24    Authorization - Visit Number 3    Authorization - Number of Visits 8    PT Start Time 1103    PT Stop Time 1147    PT Time Calculation (min) 44 min    Activity Tolerance Patient tolerated treatment well    Behavior During Therapy [facility] for tasks assessed/performed             Past Medical History:  Diagnosis Date   Allergy    Anemia    Anxiety    Arthritis    knees (09/30/2015)   Bipolar disorder (HCC)    Depression    Diabetes mellitus without complication (HCC)    Fibroid    s/p myomectomy 2010, hysterectomy 2013   GERD (gastroesophageal reflux disease)    Grave's disease    Heart murmur    Hormone disorder    Hyperlipidemia    Hypertension    Hypothyroidism    Memory loss    brain Fog per pt related to thyroid  condition   Migraine     otc med prn   PCOS (polycystic ovarian syndrome)    PMS (premenstrual syndrome)    PTSD (post-traumatic stress disorder)    Seasonal allergies    Thyroid  cancer (HCC) 2005   PAST HX   Thyroid  disease    Type II diabetes mellitus (HCC)    Vertigo    Past Surgical History:  Procedure Laterality Date   ABDOMINAL HYSTERECTOMY  10/11/2011   Procedure: HYSTERECTOMY ABDOMINAL;  Surgeon: Curlee VEAR Guan, MD;  Location: [facility];  Service: Gynecology;  Laterality: N/A;  With Repair of serosa.   APPENDECTOMY     CHOLECYSTECTOMY N/A 09/06/2013   Procedure: LAPAROSCOPIC CHOLECYSTECTOMY WITH INTRAOPERATIVE CHOLANGIOGRAM;  Surgeon: Deward GORMAN Curvin DOUGLAS, MD;  Location: [facility];  Service: General;  Laterality: N/A;   COLONOSCOPY     DIAGNOSTIC  LAPAROSCOPY     DILATION AND CURETTAGE OF UTERUS     LAPAROSCOPIC APPENDECTOMY N/A 04/10/2021   Procedure: APPENDECTOMY LAPAROSCOPIC;  Surgeon: Sheldon Standing, MD;  Location: [facility];  Service: General;  Laterality: N/A;   MYOMECTOMY  06/13/2008   LAPAROSCOPY   TOTAL THYROIDECTOMY  06/13/2004   UMBILICAL HERNIA REPAIR  06/13/1980   Patient Active Problem List   Diagnosis Date Noted   Long term current use of oral hypoglycemic drug 02/07/2023   PAD (peripheral artery disease) 07/12/2022   Hyperlipidemia associated with type 2 diabetes mellitus (HCC) 07/12/2022   Pain in both feet 12/06/2021   Generalized anxiety disorder 06/21/2021   Nightmare 06/21/2021   Overactive bladder 08/20/2020   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   Heme positive stool 07/15/2020   Morbid obesity with BMI of 40.0-44.9, adult (HCC) 07/09/2020   Bipolar 1 disorder, depressed, moderate (HCC) 02/10/2020   Breast pain, left 06/11/2019   Radiculopathy, cervical region 11/21/2018   Carpal tunnel syndrome, right upper limb 10/08/2018   Carpal tunnel syndrome, left upper limb 10/08/2018   Hypertension 09/30/2015   Type 2 diabetes mellitus with hyperglycemia, with long-term current use of insulin  (HCC) 09/30/2015   S/P cholecystectomy 09/05/2013  Borderline personality disorder (HCC) 03/14/2012    Class: Chronic   OCD (obsessive compulsive disorder) 03/14/2012    Class: Chronic   PTSD (post-traumatic stress disorder) 03/14/2012    Class: Chronic   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    Class: Chronic   Lumbago without sciatica 03/12/2012   Major depression, recurrent 03/08/2012    Class: Chronic   Snoring 12/02/2011   PCOS (polycystic ovarian syndrome) 09/05/2011   Hypothyroidism 08/13/2010   Vitamin D  deficiency 08/13/2010   ANEMIA-NOS 08/13/2010    PCP: Delbert Clam MD  REFERRING PROVIDER: Delores Army POUR PA-C  REFERRING DIAG: neck and shoulder pain  THERAPY DIAG: ncck pain; shoulder  pain;weakness  Rationale for Evaluation and Treatment: Rehabilitation  ONSET DATE: off/on since January but worsened, last 3 months shoulder stiffness  SUBJECTIVE:                                                                                                                                                                                                         SUBJECTIVE STATEMENT: I've been bad. I haven't done any of my exercises. And so I'm in more pain. I did use the putty.  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 Yes NPRS scale:6/10 R > L Pain location: bil anterior shoulders; shooting pain to fingertips right worse than left Pain orientation: Bilateral  PAIN TYPE: aching, burning, and shooting Pain description: constant  Aggravating factors: mornings; all ranges of movement with the shoulders bring on pain and sharpness in arms (no neck movements cause the pain) Relieving factors: rest   PRECAUTIONS: None   WEIGHT BEARING RESTRICTIONS: No  FALLS:  Has patient fallen in last 6 months? 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  DIAGNOSTIC FINDINGS:  X-ray cervical =bone spurs   PATIENT SURVEYS: Quick DASH= 81.8% indicating severe to extreme difficulty with ADLs  The Patient-Specific Functional Scale  Initial:  I am going to ask you to identify up to 3 important activities that you are unable to do or are having difficulty with as a  result of this problem.  Today are there any  activities that you are unable to do or having difficulty with because of this?  (Patient shown scale and patient rated each activity)  Follow up: When you first came in you had difficulty performing these activities.  Today do you still have difficulty?  Patient-Specific activity scoring scheme (Point to one number):  0 1 2 3 4 5 6 7 8 9  10 Unable                                                                                                          Able to perform To perform                                                                                                    activity at the same Activity         Level as before                                                                                                                       Injury or problem  Activity        writing                                                                         Initial:        3                2.      Reaching behind back to put on a bra                       Initial:         1-2      3. Reaching shelves and into the fridge  Initial: 3                            COGNITION: Overall cognitive status: Within functional limits for tasks assessed  POSTURE:  moderately rounded shoulders  CERVICAL ROM:   Active ROM A/PROM (deg) eval  Flexion WFLs  Extension 40  Right lateral flexion 30  Left lateral flexion 30  Right rotation   Left rotation    (Blank rows = not tested)  UPPER EXTREMITY ROM: supine active: right flex 108, left 150; supine active-assisted flexion: right 130, left 155  03/20/24  Supine Flex R 130   L 155  Active ROM Right eval Left eval Right 03/21/24 Left 03/21/24  Shoulder flexion 97 121 125 122  Shoulder extension 48 pain 55    Shoulder abduction 63 83    Shoulder adduction      Shoulder extension      Shoulder internal rotation Right buttock  Radial styloid to L5    Shoulder external rotation 54 88    Elbow flexion      Elbow  extension      Wrist flexion      Wrist extension      Wrist ulnar deviation      Wrist radial deviation      Wrist pronation      Wrist supination       (Blank rows = not tested)  UPPER EXTREMITY MMT:  MMT Right eval Left eval  Shoulder flexion 3- 3-  Shoulder extension    Shoulder abduction 2+ 2+  Shoulder adduction    Shoulder extension    Shoulder internal rotation 3 3  Shoulder external rotation 3 3  Middle trapezius    Lower trapezius    Elbow flexion    Elbow extension    Wrist flexion    Wrist extension    Wrist ulnar deviation    Wrist radial deviation    Wrist pronation    Wrist supination    Grip strength 23# 16#   (Blank rows = not tested)   02/21/24 negative ULTT   TREATMENT DATE:  03/28/24: UBE alternating fwd/bwd x 6 min PT present to discuss status Supine AA flexion with dowel + 2# x 20, then x10 with 5 second hold - cues for PPT to anchor lats - reports no pain with this 90/90 lat pull over with 10# PPT and PT bracing feet 2x5 (2nd set without PT assist) Supine Chin tucks x 10 5 sec hold, tingling into shoulders so adjusted pillow to under occiput but not neck Seated scapular retraction x 10 Supine horizontal ABD green 2x10 Supine diagonal pull down 2x 10 ea with opp arm stabilizing at 90 deg flexion Seated retraction + ER  green band x 10 Single arm ER with green (for full ROM) x 10 ea Standing ext 2x10 ea green  03/20/24: Seated posture correction Putty issued - red  Self care: shoulder heat brace donned for patient Chin tucks x 6 pt reports pain in L scapula and tingling in R arm Seated horizontal ABD green - too difficult Supine horizontal ABD green 2x10 Supine diagonal pull down 2x 10 with opp arm stabilizing at 90 deg flexion ROM measured Standing flex, scaption and ABD x 10 ea B Standing rows and ext 2x10 ea green    02/21/24:  Seated pulleys: flexion and scaption x 2 min ea Seated OH flexion hands clasped too painful Attempted  doorway stretches -  too painful Noodle along spine standing: scapular retraction 5 sec hold x 10, ER x 10, W x 10 Standing chin tuck x 10 Supine chin tuck x 10 Supine shoulder flexion noodle x 10  Supine horizontal ABD red x 10, diagonals x 10 B Standing rows and ext 2x10 ea green Grip: full finger flexion with mesh circle, digiflex 3# full finger and individual; ball squeeze with black gel ball HEP updated   02/02/24 evaluation                                                                                                                                 PATIENT EDUCATION:  Education details: Educated patient on anatomy and physiology of current symptoms, prognosis, plan of care as well as initial self care strategies to promote recovery Person educated: Patient Education method: Explanation Education comprehension: verbalized understanding  HOME EXERCISE PROGRAM: Access Code: [email or web address] URL: [email or web address] Date: 03/20/2024 Prepared by: Mliss  Exercises - Supine Shoulder Flexion Overhead with Dowel  - 1 x daily - 7 x weekly - 1 sets - 3-5 reps - 10 hold - Seated Scapular Retraction  - 1 x daily - 7 x weekly - 1 sets - 10 reps - Supine Chin Tucks on Flat Ball  - 1 x daily - 7 x weekly - 2 sets - 10 reps - Seated Cervical Retraction  - 2 x daily - 7 x weekly - 1 sets - 10 reps - 5 hold - Standing Shoulder Row with Anchored Resistance  - 1 x daily - 4 x weekly - 1-3 sets - 10 reps - Shoulder Extension with Resistance  - 1 x daily - 7 x weekly - 1-3 sets - 10 reps - Standing Shoulder External Rotation with Resistance  - 1 x daily - 3 x weekly - 2 sets - 10 reps - Supine Shoulder Horizontal Abduction with Resistance  - 1 x daily - 3 x weekly - 2 sets - 10 reps - Standing Shoulder Flexion to 90 Degrees  - 1 x daily - 3 x weekly - 1-3 sets - 10 reps - 3 sec hold - Standing Shoulder Scaption  - 1 x daily - 3 x weekly - 1-3 sets - 10 reps - 3 sec hold - Shoulder  Abduction - Thumbs Up  - 1 x daily - 3 x weekly - 1-3 sets - 10 reps - 3 sec hold  ASSESSMENT:  CLINICAL IMPRESSION:  Patient reports non-compliance with HEP due to time and poor planning. PT discussed with pt the importance of compliance to achieve her pain and strength goals. She was able to abolish shoulder pain with PPT in supine. She did very well with resistive exercises today. She still needs cues for correct posture and reports less pain in shoulders when she engages her postural muscles. Encouraged her to retract shoulders before reaching forward to decrease pain. She continues to demonstrate potential for improvement and would benefit  from continued skilled therapy to address impairments.  Re-authorization count begins next visit.   Eval:Patient is a 53 y.o. female who was seen today for physical therapy evaluation and treatment for neck and shoulder pain.  She denies neck pain but reports she was told her bilateral shoulder and UE pain was referred from the neck.  She reports her ROM has worsened in the last 3 months.  In the seated position her ROM is significantly limited (right > left) in all planes of movement but particularly with elevation right flexion 97, right abduction 63, left flexion 121, left abduction 83.  In supine (gravity-assisted) her ROM is better and less painful: right 108, left 150 degrees.  Limited strength and painful with all resisted movements.  Her grip strength is decreased and below norms for age/gender. She is severely impaired with ADLs especially writing, reaching shelves and performing some dressing tasks.  She would benefit from PT to address these deficits.   OBJECTIVE IMPAIRMENTS: decreased activity tolerance, decreased ROM, decreased strength, increased fascial restrictions, impaired perceived functional ability, impaired UE functional use, and pain.   ACTIVITY LIMITATIONS: carrying, lifting, sleeping, dressing, reach over head, and  hygiene/grooming  PARTICIPATION LIMITATIONS: meal prep, cleaning, laundry, driving, shopping, and community activity  PERSONAL FACTORS: Time since onset of injury/illness/exacerbation and 3+ comorbidities: Diabetes, age/gender putting pt at higher risk of adhesive capsulitis, multiple musculoskeletal/body regions affected are also affecting patient's functional outcome.   REHAB POTENTIAL: Good  CLINICAL DECISION MAKING: Evolving/moderate complexity  EVALUATION COMPLEXITY: Moderate   GOALS: Goals reviewed with patient? Yes  SHORT TERM GOALS: Target date: 03/01/2024    The patient will demonstrate knowledge of basic self care strategies and exercises to promote healing  Baseline:  Goal status: IN PROGRESS  2. The patient will have improved shoulder elevation ROM to at least 125 degrees needed for  reaching high shelves  Baseline:  Goal status: PARTIALLY MET 03/20/24  3.  Right and left shoulder internal rotation improved in order to reach behind the back to lumbar region for bathing, dressing and toileting Baseline:  Goal status: IN PROGRESS  4.  Improved grip strength on right to 26# and left to 18# needed for carrying groceries Baseline:  Goal status: IN PROGRESS  5.  PSFS rating with writing improved to 4 Baseline:  Goal status: IN PROGRESS   LONG TERM GOALS: Target date: 05/27/24   The patient will be independent in a safe self progression of a home exercise program to promote further recovery of function  Baseline:  Goal status: INITIAL  2.  The patient will have improved shoulder elevation ROM to at least 150 degrees needed for grooming/dressing purposes as well as reaching high shelves Baseline:  Goal status: INITIAL  3.  The patient will have grossly 4/5 strength needed to lift and lower a 3# object from a high shelf  Baseline:  Goal status: INITIAL  4.  PSFS rating with reaching behind the back for dressing improved to 4 Baseline:  Goal status:  INITIAL  5.  Quick DASH improved to     68 % indicating improved function with less pain Baseline:  Goal status: INITIAL     PLAN:  PT FREQUENCY: 2x/week  PT DURATION: 8 weeks  PLANNED INTERVENTIONS: 97164- PT Re-evaluation, 97110-Therapeutic exercises, 97530- Therapeutic activity, [id number]- Neuromuscular re-education, 97535- Self Care, [id number]- Manual therapy, [id number]- Aquatic Therapy, [id number]- Electrical stimulation (unattended), [id number]- Electrical stimulation (manual), [id number]- Ultrasound, [id number]- Traction (mechanical), [id number]- Ionotophoresis 4mg /ml Dexamethasone , 79439 (1-2 muscles), 20561 (3+  muscles)- Dry Needling, Patient/Family education, Taping, Joint mobilization, Spinal mobilization, Cryotherapy, and Moist heat  PLAN FOR NEXT SESSION: Start new authorization count after 10/20.  shoulder ROM bilaterally active- assistive and active; graded exposure to strengthening shoulder, grip strengthening, manual therapy including joint mobs; modalities and taping as needed for pain control  Mliss Cummins, PT  03/28/24 12:47 PM Phone: [phone] Fax: [phone]

## 2024-03-28 ENCOUNTER — Ambulatory Visit: Payer: MEDICAID | Admitting: Physical Therapy

## 2024-03-28 ENCOUNTER — Encounter: Payer: Self-pay | Admitting: Physical Therapy

## 2024-03-28 DIAGNOSIS — M25511 Pain in right shoulder: Secondary | ICD-10-CM | POA: Diagnosis not present

## 2024-03-28 DIAGNOSIS — M6281 Muscle weakness (generalized): Secondary | ICD-10-CM

## 2024-03-28 DIAGNOSIS — M25612 Stiffness of left shoulder, not elsewhere classified: Secondary | ICD-10-CM

## 2024-03-28 DIAGNOSIS — M25611 Stiffness of right shoulder, not elsewhere classified: Secondary | ICD-10-CM

## 2024-04-02 NOTE — Therapy (Addendum)
 OUTPATIENT PHYSICAL THERAPY CERVICAL TREATMENT  AND DISCHARGE SUMMARY   Patient Name: Lynn Martinez MRN: [id number] DOB:[birth year], 53 y.o., female Today's Date: 04/03/2024  END OF SESSION:  PT End of Session - 04/03/24 0940     Visit Number 5    Date for Recertification  05/27/24    Authorization Type 16 vistis 10/20 to 12/11    Authorization Time Period 04/01/24 to 05/27/24    Authorization - Visit Number 1    Authorization - Number of Visits 16    PT Start Time 0934    PT Stop Time 1014    PT Time Calculation (min) 40 min    Activity Tolerance Patient tolerated treatment well    Behavior During Therapy [facility] for tasks assessed/performed              Past Medical History:  Diagnosis Date   Allergy    Anemia    Anxiety    Arthritis    knees (09/30/2015)   Bipolar disorder (HCC)    Depression    Diabetes mellitus without complication (HCC)    Fibroid    s/p myomectomy 2010, hysterectomy 2013   GERD (gastroesophageal reflux disease)    Grave's disease    Heart murmur    Hormone disorder    Hyperlipidemia    Hypertension    Hypothyroidism    Memory loss    brain Fog per pt related to thyroid  condition   Migraine     otc med prn   PCOS (polycystic ovarian syndrome)    PMS (premenstrual syndrome)    PTSD (post-traumatic stress disorder)    Seasonal allergies    Thyroid  cancer (HCC) 2005   PAST HX   Thyroid  disease    Type II diabetes mellitus (HCC)    Vertigo    Past Surgical History:  Procedure Laterality Date   ABDOMINAL HYSTERECTOMY  10/11/2011   Procedure: HYSTERECTOMY ABDOMINAL;  Surgeon: Curlee VEAR Guan, MD;  Location: [facility];  Service: Gynecology;  Laterality: N/A;  With Repair of serosa.   APPENDECTOMY     CHOLECYSTECTOMY N/A 09/06/2013   Procedure: LAPAROSCOPIC CHOLECYSTECTOMY WITH INTRAOPERATIVE CHOLANGIOGRAM;  Surgeon: Deward GORMAN Curvin DOUGLAS, MD;  Location: [facility];  Service: General;  Laterality: N/A;   COLONOSCOPY      DIAGNOSTIC LAPAROSCOPY     DILATION AND CURETTAGE OF UTERUS     LAPAROSCOPIC APPENDECTOMY N/A 04/10/2021   Procedure: APPENDECTOMY LAPAROSCOPIC;  Surgeon: Sheldon Standing, MD;  Location: [facility];  Service: General;  Laterality: N/A;   MYOMECTOMY  06/13/2008   LAPAROSCOPY   TOTAL THYROIDECTOMY  06/13/2004   UMBILICAL HERNIA REPAIR  06/13/1980   Patient Active Problem List   Diagnosis Date Noted   Long term current use of oral hypoglycemic drug 02/07/2023   PAD (peripheral artery disease) 07/12/2022   Hyperlipidemia associated with type 2 diabetes mellitus (HCC) 07/12/2022   Pain in both feet 12/06/2021   Generalized anxiety disorder 06/21/2021   Nightmare 06/21/2021   Overactive bladder 08/20/2020   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   Heme positive stool 07/15/2020   Morbid obesity with BMI of 40.0-44.9, adult (HCC) 07/09/2020   Bipolar 1 disorder, depressed, moderate (HCC) 02/10/2020   Breast pain, left 06/11/2019   Radiculopathy, cervical region 11/21/2018   Carpal tunnel syndrome, right upper limb 10/08/2018   Carpal tunnel syndrome, left upper limb 10/08/2018   Hypertension 09/30/2015   Type 2 diabetes mellitus with hyperglycemia, with long-term current use of insulin  (HCC) 09/30/2015   S/P cholecystectomy  09/05/2013   Borderline personality disorder (HCC) 03/14/2012    Class: Chronic   OCD (obsessive compulsive disorder) 03/14/2012    Class: Chronic   PTSD (post-traumatic stress disorder) 03/14/2012    Class: Chronic   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    Class: Chronic   Lumbago without sciatica 03/12/2012   Major depression, recurrent 03/08/2012    Class: Chronic   Snoring 12/02/2011   PCOS (polycystic ovarian syndrome) 09/05/2011   Hypothyroidism 08/13/2010   Vitamin D  deficiency 08/13/2010   ANEMIA-NOS 08/13/2010    PCP: Delbert Clam MD  REFERRING PROVIDER: Delores Army POUR PA-C  REFERRING DIAG: neck and shoulder pain  THERAPY DIAG: ncck pain; shoulder  pain;weakness  Rationale for Evaluation and Treatment: Rehabilitation  ONSET DATE: off/on since January but worsened, last 3 months shoulder stiffness  SUBJECTIVE:                                                                                                                                                                                                         SUBJECTIVE STATEMENT: Hurting today.  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 Yes NPRS scale:7/10 R > L Pain location: bil anterior shoulders; shooting pain to fingertips right worse than left Pain orientation: Bilateral  PAIN TYPE: aching, burning, and shooting Pain description: constant  Aggravating factors: mornings; all ranges of movement with the shoulders bring on pain and sharpness in arms (no neck movements cause the pain) Relieving factors: rest   PRECAUTIONS: None   WEIGHT BEARING RESTRICTIONS: No  FALLS:  Has patient fallen in last 6 months? 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  DIAGNOSTIC FINDINGS:  X-ray cervical =bone spurs   PATIENT SURVEYS: Quick DASH= 81.8% indicating severe to extreme difficulty with ADLs  The Patient-Specific Functional Scale  Initial:  I am going to ask you to identify up to 3 important activities that you are unable to do or are having difficulty with as a result of this problem.  Today are there any activities that you are unable to do  or having difficulty with because of this?   (Patient shown scale and patient rated each activity)  Follow up: When you first came in you had difficulty performing these activities.  Today do you still have difficulty?  Patient-Specific activity scoring scheme (Point to one number):  0 1 2 3 4 5 6 7 8 9  10 Unable                                                                                                          Able to perform To perform                                                                                                    activity at the same Activity         Level as before                                                                                                                       Injury or problem  Activity        writing                                                                         Initial:        3                2.      Reaching behind back to put on a bra                       Initial:         1-2      3. Reaching shelves and into the fridge                              Initial:  3                            COGNITION: Overall cognitive status: Within functional limits for tasks assessed  POSTURE:  moderately rounded shoulders  CERVICAL ROM:   Active ROM A/PROM (deg) eval  Flexion WFLs  Extension 40  Right lateral flexion 30  Left lateral flexion 30  Right rotation   Left rotation    (Blank rows = not tested)  UPPER EXTREMITY ROM: supine active: right flex 108, left 150; supine active-assisted flexion: right 130, left 155  03/20/24  Supine Flex R 130   L 155  Active ROM Right eval Left eval Right 03/20/24 Left 03/20/24  Shoulder flexion 97 121 125 seated 122 seated  Shoulder extension 48 pain 55    Shoulder abduction 63 83    Shoulder adduction      Shoulder extension      Shoulder internal rotation Right buttock  Radial styloid to L5    Shoulder external rotation 54 88    Elbow flexion      Elbow extension      Wrist flexion      Wrist extension      Wrist  ulnar deviation      Wrist radial deviation      Wrist pronation      Wrist supination       (Blank rows = not tested)  UPPER EXTREMITY MMT:  MMT Right eval Left eval  Shoulder flexion 3- 3-  Shoulder extension    Shoulder abduction 2+ 2+  Shoulder adduction    Shoulder extension    Shoulder internal rotation 3 3  Shoulder external rotation 3 3  Middle trapezius    Lower trapezius    Elbow flexion    Elbow extension    Wrist flexion    Wrist extension    Wrist ulnar deviation    Wrist radial deviation    Wrist pronation    Wrist supination    Grip strength 23# 16#   (Blank rows = not tested)   02/21/24 negative ULTT   TREATMENT DATE:  04/03/24: UBE alternating fwd/bwd x 6 min PT present to discuss status 90/90 lat pull over with 10# PPT and  bracing feet on 20 inch RITFIT 4x5 Supine Chin tucks x 10 5 sec hold, tingling into shoulders so adjusted pillow to under occiput but not neck Seated chin tuck x 10 Supine horizontal ABD green 2x10 Prone T and Y - did not tolerate SL trio: ER 2# x 10, flex to 90 deg 1# x 10, horizontal ABD 1# x 10 Single arm ER with green (for full ROM) 2x 10 ea Standing ext 2x10 ea green Standing row green 2x10  03/28/24: UBE alternating fwd/bwd x 6 min PT present to discuss status Supine AA flexion with dowel + 2# x 20, then x10 with 5 second hold - cues for PPT to anchor lats - reports no pain with this 90/90 lat pull over with 10# PPT and PT bracing feet 2x5 (2nd set without PT assist) Supine Chin tucks x 10 5 sec hold, tingling into shoulders so adjusted pillow to under occiput but not neck Seated scapular retraction x 10 Supine horizontal ABD green 2x10 Supine diagonal pull down 2x 10 ea with opp arm stabilizing at 90 deg flexion Seated retraction + ER  green band x 10 Single arm ER with green (for full ROM) x 10 ea Standing ext  2x10 ea green  03/20/24: Seated posture correction Putty issued - red  Self care: shoulder heat brace  donned for patient Chin tucks x 6 pt reports pain in L scapula and tingling in R arm Seated horizontal ABD green - too difficult Supine horizontal ABD green 2x10 Supine diagonal pull down 2x 10 with opp arm stabilizing at 90 deg flexion ROM measured Standing flex, scaption and ABD x 10 ea B Standing rows and ext 2x10 ea green    02/21/24:  Seated pulleys: flexion and scaption x 2 min ea Seated OH flexion hands clasped too painful Attempted doorway stretches - too painful Noodle along spine standing: scapular retraction 5 sec hold x 10, ER x 10, W x 10 Standing chin tuck x 10 Supine chin tuck x 10 Supine shoulder flexion noodle x 10  Supine horizontal ABD red x 10, diagonals x 10 B Standing rows and ext 2x10 ea green Grip: full finger flexion with mesh circle, digiflex 3# full finger and individual; ball squeeze with black gel ball HEP updated   02/02/24 evaluation                                                                                                                                 PATIENT EDUCATION:  Education details: Educated patient on anatomy and physiology of current symptoms, prognosis, plan of care as well as initial self care strategies to promote recovery Person educated: Patient Education method: Explanation Education comprehension: verbalized understanding  HOME EXERCISE PROGRAM: Access Code: [email or web address] URL: [email or web address] Date: 03/20/2024 Prepared by: Mliss  Exercises - Supine Shoulder Flexion Overhead with Dowel  - 1 x daily - 7 x weekly - 1 sets - 3-5 reps - 10 hold - Seated Scapular Retraction  - 1 x daily - 7 x weekly - 1 sets - 10 reps - Supine Chin Tucks on Flat Ball  - 1 x daily - 7 x weekly - 2 sets - 10 reps - Seated Cervical Retraction  - 2 x daily - 7 x weekly - 1 sets - 10 reps - 5 hold - Standing Shoulder Row with Anchored Resistance  - 1 x daily - 4 x weekly - 1-3 sets - 10 reps - Shoulder Extension with Resistance  -  1 x daily - 7 x weekly - 1-3 sets - 10 reps - Standing Shoulder External Rotation with Resistance  - 1 x daily - 3 x weekly - 2 sets - 10 reps - Supine Shoulder Horizontal Abduction with Resistance  - 1 x daily - 3 x weekly - 2 sets - 10 reps - Standing Shoulder Flexion to 90 Degrees  - 1 x daily - 3 x weekly - 1-3 sets - 10 reps - 3 sec hold - Standing Shoulder Scaption  - 1 x daily - 3 x weekly - 1-3 sets - 10 reps - 3 sec hold - Shoulder Abduction - Thumbs Up  - 1 x  daily - 3 x weekly - 1-3 sets - 10 reps - 3 sec hold  ASSESSMENT:  CLINICAL IMPRESSION:  Patient with ongoing 7/10 pain. She has been compliant with HEP. She tolerated introduction of sidelying trio well, but was not able to do prone T and Y on the R. Plan to add to HEP next visit. Patient did better with ER using weight than with the band. We continue to review importance of posture and scapular positioning.   Eval:Patient is a 53 y.o. female who was seen today for physical therapy evaluation and treatment for neck and shoulder pain.  She denies neck pain but reports she was told her bilateral shoulder and UE pain was referred from the neck.  She reports her ROM has worsened in the last 3 months.  In the seated position her ROM is significantly limited (right > left) in all planes of movement but particularly with elevation right flexion 97, right abduction 63, left flexion 121, left abduction 83.  In supine (gravity-assisted) her ROM is better and less painful: right 108, left 150 degrees.  Limited strength and painful with all resisted movements.  Her grip strength is decreased and below norms for age/gender. She is severely impaired with ADLs especially writing, reaching shelves and performing some dressing tasks.  She would benefit from PT to address these deficits.   OBJECTIVE IMPAIRMENTS: decreased activity tolerance, decreased ROM, decreased strength, increased fascial restrictions, impaired perceived functional ability,  impaired UE functional use, and pain.   ACTIVITY LIMITATIONS: carrying, lifting, sleeping, dressing, reach over head, and hygiene/grooming  PARTICIPATION LIMITATIONS: meal prep, cleaning, laundry, driving, shopping, and community activity  PERSONAL FACTORS: Time since onset of injury/illness/exacerbation and 3+ comorbidities: Diabetes, age/gender putting pt at higher risk of adhesive capsulitis, multiple musculoskeletal/body regions affected are also affecting patient's functional outcome.   REHAB POTENTIAL: Good  CLINICAL DECISION MAKING: Evolving/moderate complexity  EVALUATION COMPLEXITY: Moderate   GOALS: Goals reviewed with patient? Yes  SHORT TERM GOALS: Target date: 03/01/2024    The patient will demonstrate knowledge of basic self care strategies and exercises to promote healing  Baseline:  Goal status: IN PROGRESS  2. The patient will have improved shoulder elevation ROM to at least 125 degrees needed for  reaching high shelves  Baseline:  Goal status: PARTIALLY MET 03/20/24  3.  Right and left shoulder internal rotation improved in order to reach behind the back to lumbar region for bathing, dressing and toileting Baseline:  Goal status: IN PROGRESS  4.  Improved grip strength on right to 26# and left to 18# needed for carrying groceries Baseline:  Goal status: IN PROGRESS  5.  PSFS rating with writing improved to 4 Baseline:  Goal status: IN PROGRESS   LONG TERM GOALS: Target date: 05/27/24   The patient will be independent in a safe self progression of a home exercise program to promote further recovery of function  Baseline:  Goal status: INITIAL  2.  The patient will have improved shoulder elevation ROM to at least 150 degrees needed for grooming/dressing purposes as well as reaching high shelves Baseline:  Goal status: INITIAL  3.  The patient will have grossly 4/5 strength needed to lift and lower a 3# object from a high shelf  Baseline:  Goal  status: INITIAL  4.  PSFS rating with reaching behind the back for dressing improved to 4 Baseline:  Goal status: INITIAL  5.  Quick DASH improved to     68 % indicating improved function  with less pain Baseline:  Goal status: INITIAL     PLAN:  PT FREQUENCY: 2x/week  PT DURATION: 8 weeks  PLANNED INTERVENTIONS: 02835- PT Re-evaluation, 97110-Therapeutic exercises, 97530- Therapeutic activity, 97112- Neuromuscular re-education, 97535- Self Care, 02859- Manual therapy, [phone]- Aquatic Therapy, [phone]- Electrical stimulation (unattended), [phone]- Electrical stimulation (manual), [id number]- Ultrasound, [id number]- Traction (mechanical), [id number]- Ionotophoresis 4mg /ml Dexamethasone , 79439 (1-2 muscles), 20561 (3+ muscles)- Dry Needling, Patient/Family education, Taping, Joint mobilization, Spinal mobilization, Cryotherapy, and Moist heat  PLAN FOR NEXT SESSION:  shoulder ROM bilaterally active- assistive and active; graded exposure to strengthening shoulder, grip strengthening, manual therapy including joint mobs; modalities and taping as needed for pain control  Mliss Cummins, PT  04/03/24 10:16 AM Phone: [phone] Fax: [phone]   PHYSICAL THERAPY DISCHARGE SUMMARY  Visits from Start of Care: 5  Current functional level related to goals / functional outcomes: Unable to assess as pt did not return for f/u visits.    Remaining deficits: Unable to assess as pt did not return for f/u visits.   Education / Equipment: HEP   Patient agrees to discharge. Patient goals were not met. Patient is being discharged due to the patient's request. Patient phoned to say she was cancelling her remaining appointments because she is trying a different medical procedure. If the procedure doesn't help, she will return to PT at a later time.  Mliss Cummins, PT 04/24/24 2:27 PM  [facility] [address], [address] [place], [place] [address] Phone # [phone] Fax  [phone]

## 2024-04-03 ENCOUNTER — Encounter: Payer: Self-pay | Admitting: Physical Therapy

## 2024-04-03 ENCOUNTER — Ambulatory Visit: Payer: MEDICAID | Admitting: Physical Therapy

## 2024-04-03 DIAGNOSIS — M25511 Pain in right shoulder: Secondary | ICD-10-CM | POA: Diagnosis not present

## 2024-04-03 DIAGNOSIS — M25611 Stiffness of right shoulder, not elsewhere classified: Secondary | ICD-10-CM

## 2024-04-03 DIAGNOSIS — M6281 Muscle weakness (generalized): Secondary | ICD-10-CM

## 2024-04-03 DIAGNOSIS — M25612 Stiffness of left shoulder, not elsewhere classified: Secondary | ICD-10-CM

## 2024-04-04 ENCOUNTER — Emergency Department (HOSPITAL_BASED_OUTPATIENT_CLINIC_OR_DEPARTMENT_OTHER)
Admission: EM | Admit: 2024-04-04 | Discharge: 2024-04-04 | Disposition: A | Discharge status: Home / Self Care | Payer: MEDICAID | Attending: Emergency Medicine | Admitting: Emergency Medicine

## 2024-04-04 ENCOUNTER — Encounter (HOSPITAL_BASED_OUTPATIENT_CLINIC_OR_DEPARTMENT_OTHER): Payer: Self-pay

## 2024-04-04 ENCOUNTER — Other Ambulatory Visit: Payer: Self-pay

## 2024-04-04 DIAGNOSIS — L299 Pruritus, unspecified: Secondary | ICD-10-CM | POA: Diagnosis present

## 2024-04-04 DIAGNOSIS — I10 Essential (primary) hypertension: Secondary | ICD-10-CM | POA: Diagnosis not present

## 2024-04-04 DIAGNOSIS — Z794 Long term (current) use of insulin: Secondary | ICD-10-CM | POA: Diagnosis not present

## 2024-04-04 DIAGNOSIS — Z79899 Other long term (current) drug therapy: Secondary | ICD-10-CM | POA: Diagnosis not present

## 2024-04-04 DIAGNOSIS — E119 Type 2 diabetes mellitus without complications: Secondary | ICD-10-CM | POA: Diagnosis not present

## 2024-04-04 LAB — COMPREHENSIVE METABOLIC PANEL WITH GFR
ALT: 20 U/L (ref 0–44)
AST: 20 U/L (ref 15–41)
Albumin: 4.3 g/dL (ref 3.5–5.0)
Alkaline Phosphatase: 58 U/L (ref 38–126)
Anion gap: 12 (ref 5–15)
BUN: 13 mg/dL (ref 6–20)
CO2: 25 mmol/L (ref 22–32)
Calcium: 9.8 mg/dL (ref 8.9–10.3)
Chloride: 105 mmol/L (ref 98–111)
Creatinine, Ser: 0.89 mg/dL (ref 0.44–1.00)
GFR, Estimated: >60 mL/min (ref >60)
Glucose, Bld: 88 mg/dL (ref 70–99)
Potassium: 4.6 mmol/L (ref 3.5–5.1)
Sodium: 142 mmol/L (ref 135–145)
Total Bilirubin: 0.2 mg/dL (ref 0.0–1.2)
Total Protein: 7.8 g/dL (ref 6.5–8.1)

## 2024-04-04 LAB — CBC WITH DIFFERENTIAL/PLATELET
Abs Immature Granulocytes: 0.02 K/uL (ref 0.00–0.07)
Basophils Absolute: 0.1 K/uL (ref 0.0–0.1)
Basophils Relative: 1 %
Eosinophils Absolute: 0.2 K/uL (ref 0.0–0.5)
Eosinophils Relative: 3 %
HCT: 41.8 % (ref 36.0–46.0)
Hemoglobin: 13.3 g/dL (ref 12.0–15.0)
Immature Granulocytes: 0 %
Lymphocytes Relative: 36 %
Lymphs Abs: 2.7 K/uL (ref 0.7–4.0)
MCH: 27.1 pg (ref 26.0–34.0)
MCHC: 31.8 g/dL (ref 30.0–36.0)
MCV: 85.1 fL (ref 80.0–100.0)
Monocytes Absolute: 0.4 K/uL (ref 0.1–1.0)
Monocytes Relative: 6 %
Neutro Abs: 3.9 K/uL (ref 1.7–7.7)
Neutrophils Relative %: 54 %
Platelets: 476 K/uL — ABNORMAL HIGH (ref 150–400)
RBC: 4.91 MIL/uL (ref 3.87–5.11)
RDW: 14.3 % (ref 11.5–15.5)
WBC: 7.3 K/uL (ref 4.0–10.5)
nRBC: 0 % (ref 0.0–0.2)

## 2024-04-04 MED ORDER — KETAMINE HCL 10 MG/ML IJ SOLN: 25.0000 mg | Freq: Once | INTRAMUSCULAR | Status: DC

## 2024-04-04 NOTE — ED Notes (Signed)
 Reviewed discharge instructions and follow up with pt. Pt states understanding. Ambulatory at discharge

## 2024-04-04 NOTE — ED Triage Notes (Signed)
 Rash to lower back noticed several days ago.Complains of itching Also has pain/wound and itching to umbilical area for unknown time Diabetic No open area noted. Few scratch marks. Concerned because she is diabetic

## 2024-04-04 NOTE — Discharge Instructions (Signed)
 Lynn Martinez  Thank you for allowing us  to take care of you today.  You came to the Emergency Department today because you had skin changes on your bellybutton as well as your back and you were concern for infection.  The areas look a little dry, however no signs of infection, your white blood cell count is not elevated, therefore we do not think that you are having a major infection.  We recommend to continue to use lotion on the area to rehydrate the skin.  Your blood glucose today was 88 which is very good, and your liver numbers are not elevated, we checked this because sometimes liver dysfunction can make you itchy as well, given your labs are looking well and your exam is not concerning for infection, you are safe to go home and follow-up with your outpatient provider for further management of your diabetes.  To-Do: 1. Please follow-up with your primary doctor within 1 - 2 weeks / as soon as possible.   Please return to the Emergency Department or call 911 if you experience have worsening of your symptoms, or do not get better, chest pain, shortness of breath, severe or significantly worsening pain, high fever, severe confusion, pass out or have any reason to think that you need emergency medical care.   We hope you feel better soon.   Mitzie Later, MD Department of Emergency Medicine [facility]

## 2024-04-04 NOTE — ED Provider Notes (Signed)
 [place] [facility] Provider Note   CSN: [id number] Arrival date & time: 04/04/24  1028     History Chief Complaint  Patient presents with   Pruritis    HPI: Itching Lynn Martinez is a 53 y.o. female with history pertinent for T2DM on Farxiga , Ozempic , insulin , bipolar disorder, PTSD, OCD, HTN, PCOS who presents complaining of itching and concerns for his skin changes on her back and umbilicus. Patient arrived via POV.  History provided by patient.  No interpreter required during this encounter.  Patient reports that she has a past medical history of type 2 diabetes.  Reports that she has had recent skin changes about her bellybutton, as well as on her back.  Reports that she has been putting ointment on these areas, however the skin discoloration persists, and they have intermittently been mildly itchy.  Reports that her father died of diabetes related wounds, therefore her mother encouraged her to come have the wounds checked out to ensure that she did not require antibiotics.  Patient's recorded medical, surgical, social, medication list and allergies were reviewed in the Snapshot window as part of the initial history.   Prior to Admission medications   Medication Sig Start Date End Date Taking? Authorizing Provider  atorvastatin  (LIPITOR) 10 MG tablet TAKE 1 TABLET BY MOUTH EVERY DAY AT BEDTIME 01/02/24   Newlin, Enobong, MD  benzoyl peroxide -erythromycin  (BENZAMYCIN) gel Apply topically 2 (two) times daily. 03/05/24   Newlin, Enobong, MD  citalopram  (CELEXA ) 40 MG tablet Take 1 tablet (40 mg total) by mouth daily. 05/01/23   Newlin, Enobong, MD  Clindamycin  Phos-Benzoyl Perox gel Apply 1 Application topically 2 (two) times daily. 03/13/24   [provider]  diclofenac  Sodium (VOLTAREN ) 1 % GEL Apply 2 g topically as needed. 03/13/13   [provider]  FARXIGA  10 MG TABS tablet TAKE 1 TABLET BY MOUTH EVERY DAY BEFORE  BREAKFAST *KEEP UPCOMING APPOINTMENT FOR ADDITIONAL REFILLS* 02/13/24   Delbert Clam, MD  gabapentin  (NEURONTIN ) 300 MG capsule TAKE TWO (2) CAPSULES BY MOUTH TWICE DAILY 08/08/23   Newlin, Enobong, MD  hydrOXYzine  (VISTARIL ) 25 MG capsule Take 25 mg by mouth 3 (three) times daily. 02/01/24   [provider]  Insulin  Pen Needle (BD PEN NEEDLE MICRO U/F) 32G X 6 MM MISC Use in the morning, at noon, evening, and at bedtime. 06/27/23   Shamleffer, Ibtehal Jaralla, MD  levothyroxine  (SYNTHROID ) 150 MCG tablet TAKE 1 TABLET BY MOUTH DAILY 12/11/23   Shamleffer, Ibtehal Jaralla, MD  lithium  carbonate (ESKALITH ) 450 MG ER tablet TAKE 2 TABLETS BY MOUTH AT BEDTIME 06/29/23   Brien Belvie BRAVO, MD  Lurasidone  HCl 120 MG TABS Take 1 tablet (120 mg total) by mouth at bedtime. 06/29/23   Brien Belvie BRAVO, MD  methocarbamol  (ROBAXIN ) 500 MG tablet Take 1 tablet (500 mg total) by mouth every 6 (six) hours as needed for muscle spasms. 06/29/23   Brien Belvie BRAVO, MD  Multiple Vitamins-Minerals (ONE-A-DAY WOMENS PO) Take 1 tablet by mouth daily with breakfast.    [provider]  ofloxacin  (OCUFLOX ) 0.3 % ophthalmic solution INSTILL 1 DROP INTO LEFT EYE 4 TIMES A DAY 03/22/24   Newlin, Enobong, MD  omeprazole  (PRILOSEC) 40 MG capsule TAKE 1 CAPSULE (40 MG TOTAL) BY MOUTH DAILY. 09/18/23   Brien Belvie BRAVO, MD  prazosin  (MINIPRESS ) 1 MG capsule TAKE 1 CAPSULE AT BEDTIME FOR SLEEP/NIGHTMARES 06/29/23   Brien Belvie BRAVO, MD  propranolol  (INDERAL ) 40  MG tablet TAKE ONE (1) TABLET BY MOUTH TWICE DAILY 03/05/24   Newlin, Corrina, MD  Semaglutide , 1 MG/DOSE, 4 MG/3ML SOPN Inject 1 mg as directed once a week. Patient taking differently: Inject 2 mg as directed once a week. 06/27/23   Shamleffer, Ibtehal Jaralla, MD  Vitamin D , Ergocalciferol , (DRISDOL ) 1.25 MG (50000 UNIT) CAPS capsule Take 1 capsule (50,000 Units total) by mouth every Saturday. 07/01/23   Brien Belvie BRAVO, MD     Allergies: Bee venom,  Morphine, Tape, Codeine, Golytely  [peg 3350 -kcl-nabcb-nacl-nasulf], Ibuprofen, Metformin and related, Penicillins, and Aspirin   Review of Systems   ROS as per HPI  Physical Exam Updated Vital Signs BP 109/81 (BP Location: Left Arm)   Pulse 67   Temp 98 F (36.7 C) (Oral)   Resp 18   Wt 112.5 kg   LMP 09/20/2011   SpO2 99%   BMI 40.03 kg/m  Physical Exam Vitals and nursing note reviewed.  Constitutional:      General: She is not in acute distress.    Appearance: She is well-developed.  HENT:     Head: Normocephalic and atraumatic.  Eyes:     Conjunctiva/sclera: Conjunctivae normal.  Cardiovascular:     Rate and Rhythm: Normal rate and regular rhythm.     Heart sounds: No murmur heard. Pulmonary:     Effort: Pulmonary effort is normal. No respiratory distress.     Breath sounds: Normal breath sounds.  Abdominal:     Palpations: Abdomen is soft.     Tenderness: There is no abdominal tenderness.  Musculoskeletal:        General: No swelling.     Cervical back: Neck supple.     Comments: Patch of hyperpigmentation with surrounding flaking skin on the back, mild hyperpigmentation within the umbilicus, see images of each, no erythema, induration, focal tenderness to palpation, warmth  Skin:    General: Skin is warm and dry.     Capillary Refill: Capillary refill takes less than 2 seconds.  Neurological:     Mental Status: She is alert.  Psychiatric:        Mood and Affect: Mood normal.        ED Course/ Medical Decision Making/ A&P    Procedures Procedures   Medications Ordered in ED Medications - No data to display  Medical Decision Making:   [name] is a 53 y.o. female who presents for skin changes as per above.  Physical exam is pertinent for no appreciable changes to umbilicus, hyperpigmented patch on back.   The differential includes but is not limited to cellulitis, abscess, hyperpigmentation, fungal infection,  cholestasis.  Independent historian: None  External data reviewed: No pertinent external data  Labs: Ordered, Independent interpretation, and Details: CBC without leukocytosis, anemia, thrombocytopenia.  CMP without AKI, emergent electrolyte derangement, emergent LFT abnormality  Radiology: Not indicated No results found.  EKG/Medicine tests: Not indicated EKG Interpretation:                  Interventions: None  See the EMR for full details regarding lab and imaging results.  Patient overall well-appearing on exam, vitally stable, afebrile.  Patient does have hyperpigmentation on her back, area is not currently itchy, itchiness is primarily of her umbilicus.  In absence of itching, lesion of back less consistent with fungal infection, no skin changes consistent with fungal infection on patient's umbilicus.  Screening labs obtained, absence of a leukocytosis supports that patient does not have severe infectious  or inflammatory condition contributing to her presentation.  Additionally given cholestasis can cause significant itching, screening CMP obtained and does not reveal abnormal LFTs.  Patient also with reassuring glucose on CMP.  Discussed reassuring labs with patient at bedside, encouraged follow-up with PCP, patient comfortable with this plan, discharged in stable condition.  Presentation is most consistent with acute uncomplicated illness with systemic symptoms  Discussion of management or test interpretations with external provider(s): Not indicated  Risk Drugs:None  Disposition: DISCHARGE: I believe that the patient is safe for discharge home with outpatient follow-up. Patient was informed of all pertinent physical exam, laboratory, and imaging findings. Patient's suspected etiology of their symptom presentation was discussed with the patient and all questions were answered. We discussed following up with PCP. I provided thorough ED return precautions. The patient feels safe and  comfortable with this plan.  MDM generated using voice dictation software and may contain dictation errors.  Please contact me for any clarification or with any questions.  Clinical Impression:  1. Itching      Discharge   Final Clinical Impression(s) / ED Diagnoses Final diagnoses:  Itching    Rx / DC Orders ED Discharge Orders     None        Rogelia Jerilynn RAMAN, MD 04/07/24 1511

## 2024-04-17 ENCOUNTER — Telehealth: Payer: Self-pay

## 2024-04-17 ENCOUNTER — Ambulatory Visit: Payer: MEDICAID | Attending: Surgical | Admitting: Physical Therapy

## 2024-04-17 ENCOUNTER — Telehealth: Payer: Self-pay | Admitting: Physical Therapy

## 2024-04-17 DIAGNOSIS — M25511 Pain in right shoulder: Secondary | ICD-10-CM | POA: Insufficient documentation

## 2024-04-17 DIAGNOSIS — M25611 Stiffness of right shoulder, not elsewhere classified: Secondary | ICD-10-CM | POA: Insufficient documentation

## 2024-04-17 DIAGNOSIS — M25612 Stiffness of left shoulder, not elsewhere classified: Secondary | ICD-10-CM | POA: Insufficient documentation

## 2024-04-17 DIAGNOSIS — M25512 Pain in left shoulder: Secondary | ICD-10-CM | POA: Insufficient documentation

## 2024-04-17 DIAGNOSIS — M6281 Muscle weakness (generalized): Secondary | ICD-10-CM | POA: Insufficient documentation

## 2024-04-17 NOTE — Therapy (Incomplete)
 OUTPATIENT PHYSICAL THERAPY CERVICAL TREATMENT    Patient Name: Lynn Martinez MRN: [id number] DOB:[birth year], 53 y.o., female Today's Date: 04/17/2024  END OF SESSION:        Past Medical History:  Diagnosis Date   Allergy    Anemia    Anxiety    Arthritis    knees (09/30/2015)   Bipolar disorder (HCC)    Depression    Diabetes mellitus without complication (HCC)    Fibroid    s/p myomectomy 2010, hysterectomy 2013   GERD (gastroesophageal reflux disease)    Grave's disease    Heart murmur    Hormone disorder    Hyperlipidemia    Hypertension    Hypothyroidism    Memory loss    brain Fog per pt related to thyroid  condition   Migraine     otc med prn   PCOS (polycystic ovarian syndrome)    PMS (premenstrual syndrome)    PTSD (post-traumatic stress disorder)    Seasonal allergies    Thyroid  cancer (HCC) 2005   PAST HX   Thyroid  disease    Type II diabetes mellitus (HCC)    Vertigo    Past Surgical History:  Procedure Laterality Date   ABDOMINAL HYSTERECTOMY  10/11/2011   Procedure: HYSTERECTOMY ABDOMINAL;  Surgeon: Curlee VEAR Guan, MD;  Location: [facility];  Service: Gynecology;  Laterality: N/A;  With Repair of serosa.   APPENDECTOMY     CHOLECYSTECTOMY N/A 09/06/2013   Procedure: LAPAROSCOPIC CHOLECYSTECTOMY WITH INTRAOPERATIVE CHOLANGIOGRAM;  Surgeon: Deward GORMAN Curvin DOUGLAS, MD;  Location: [facility];  Service: General;  Laterality: N/A;   COLONOSCOPY     DIAGNOSTIC LAPAROSCOPY     DILATION AND CURETTAGE OF UTERUS     LAPAROSCOPIC APPENDECTOMY N/A 04/10/2021   Procedure: APPENDECTOMY LAPAROSCOPIC;  Surgeon: Sheldon Standing, MD;  Location: [facility];  Service: General;  Laterality: N/A;   MYOMECTOMY  06/13/2008   LAPAROSCOPY   TOTAL THYROIDECTOMY  06/13/2004   UMBILICAL HERNIA REPAIR  06/13/1980   Patient Active Problem List   Diagnosis Date Noted   Long term current use of oral hypoglycemic drug 02/07/2023   PAD (peripheral artery disease)  07/12/2022   Hyperlipidemia associated with type 2 diabetes mellitus (HCC) 07/12/2022   Pain in both feet 12/06/2021   Generalized anxiety disorder 06/21/2021   Nightmare 06/21/2021   Overactive bladder 08/20/2020   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   Heme positive stool 07/15/2020   Morbid obesity with BMI of 40.0-44.9, adult (HCC) 07/09/2020   Bipolar 1 disorder, depressed, moderate (HCC) 02/10/2020   Breast pain, left 06/11/2019   Radiculopathy, cervical region 11/21/2018   Carpal tunnel syndrome, right upper limb 10/08/2018   Carpal tunnel syndrome, left upper limb 10/08/2018   Hypertension 09/30/2015   Type 2 diabetes mellitus with hyperglycemia, with long-term current use of insulin  (HCC) 09/30/2015   S/P cholecystectomy 09/05/2013   Borderline personality disorder (HCC) 03/14/2012    Class: Chronic   OCD (obsessive compulsive disorder) 03/14/2012    Class: Chronic   PTSD (post-traumatic stress disorder) 03/14/2012    Class: Chronic   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    Class: Chronic   Lumbago without sciatica 03/12/2012   Major depression, recurrent 03/08/2012    Class: Chronic   Snoring 12/02/2011   PCOS (polycystic ovarian syndrome) 09/05/2011   Hypothyroidism 08/13/2010   Vitamin D  deficiency 08/13/2010   ANEMIA-NOS 08/13/2010    PCP: Delbert Clam MD  REFERRING PROVIDER: Delores Army POUR PA-C  REFERRING DIAG: neck  and shoulder pain  THERAPY DIAG: ncck pain; shoulder pain;weakness  Rationale for Evaluation and Treatment: Rehabilitation  ONSET DATE: off/on since January but worsened, last 3 months shoulder stiffness  SUBJECTIVE:                                                                                                                                                                                                         SUBJECTIVE STATEMENT: ***  Hand dominance: Right  PERTINENT HISTORY:  Husband recently passed away 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 Yes NPRS scale:7/10 R > L Pain location: bil anterior shoulders; shooting pain to fingertips right worse than left Pain orientation: Bilateral  PAIN TYPE: aching, burning, and shooting Pain description: constant  Aggravating factors: mornings; all ranges of movement with the shoulders bring on pain and sharpness in arms (no neck movements cause the pain) Relieving factors: rest   PRECAUTIONS: None   WEIGHT BEARING RESTRICTIONS: No  FALLS:  Has patient fallen in last 6 months? 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  DIAGNOSTIC FINDINGS:  X-ray cervical =bone spurs   PATIENT SURVEYS: Quick DASH= 81.8% indicating severe to extreme difficulty with ADLs  The Patient-Specific Functional Scale  Initial:  I am going to ask you to identify up to 3 important activities that you are unable to do or are having difficulty with as a result of this problem.  Today are there any activities that you are unable to do or having difficulty with because of this?  (Patient shown scale and patient rated each activity)  Follow up: When you first came in you had difficulty performing these activities.  Today do you still have difficulty?  Patient-Specific activity scoring scheme (Point to one number):  0 1 2 3 4 5 6 7 8 9  10 Unable  Able to perform To perform                                                                                                    activity  at the same Activity         Level as before                                                                                                                       Injury or problem  Activity        writing                                                                         Initial:        3                2.      Reaching behind back to put on a bra                       Initial:         1-2      3. Reaching shelves and into the fridge                              Initial: 3                            COGNITION: Overall cognitive status: Within functional limits for tasks assessed  POSTURE:  moderately rounded shoulders  CERVICAL ROM:   Active ROM A/PROM (deg) eval  Flexion WFLs  Extension 40  Right lateral flexion 30  Left lateral flexion 30  Right rotation   Left rotation    (Blank rows = not tested)  UPPER EXTREMITY ROM: supine active: right flex 108, left 150; supine active-assisted flexion: right 130, left 155  03/20/24  Supine Flex R 130   L 155  Active ROM Right eval Left eval Right 03/20/24 Left 03/20/24  Shoulder flexion 97 121 125 seated 122 seated  Shoulder extension 48 pain 55    Shoulder abduction 63 83    Shoulder adduction      Shoulder extension  Shoulder internal rotation Right buttock  Radial styloid to L5    Shoulder external rotation 54 88    Elbow flexion      Elbow extension      Wrist flexion      Wrist extension      Wrist ulnar deviation      Wrist radial deviation      Wrist pronation      Wrist supination       (Blank rows = not tested)  UPPER EXTREMITY MMT:  MMT Right eval Left eval  Shoulder flexion 3- 3-  Shoulder extension    Shoulder abduction 2+ 2+  Shoulder adduction    Shoulder extension    Shoulder internal rotation 3 3  Shoulder external rotation 3 3  Middle trapezius    Lower trapezius    Elbow flexion    Elbow extension    Wrist flexion    Wrist extension    Wrist ulnar deviation    Wrist radial  deviation    Wrist pronation    Wrist supination    Grip strength 23# 16#   (Blank rows = not tested)   02/21/24 negative ULTT   TREATMENT DATE:  04/17/24: UBE alternating fwd/bwd x 6 min PT present to discuss status 90/90 lat pull over with 10# PPT and  bracing feet on 20 inch RITFIT 4x5 Supine Chin tucks x 10 5 sec hold, tingling into shoulders so adjusted pillow to under occiput but not neck Seated chin tuck x 10 Supine horizontal ABD green 2x10 Prone T and Y - did not tolerate SL trio: ER 2# x 10, flex to 90 deg 1# x 10, horizontal ABD 1# x 10 Single arm ER with green (for full ROM) 2x 10 ea Standing ext 2x10 ea green Standing row green 2x10   04/03/24: UBE alternating fwd/bwd x 6 min PT present to discuss status 90/90 lat pull over with 10# PPT and  bracing feet on 20 inch RITFIT 4x5 Supine Chin tucks x 10 5 sec hold, tingling into shoulders so adjusted pillow to under occiput but not neck Seated chin tuck x 10 Supine horizontal ABD green 2x10 Prone T and Y - did not tolerate SL trio: ER 2# x 10, flex to 90 deg 1# x 10, horizontal ABD 1# x 10 Single arm ER with green (for full ROM) 2x 10 ea Standing ext 2x10 ea green Standing row green 2x10  03/28/24: UBE alternating fwd/bwd x 6 min PT present to discuss status Supine AA flexion with dowel + 2# x 20, then x10 with 5 second hold - cues for PPT to anchor lats - reports no pain with this 90/90 lat pull over with 10# PPT and PT bracing feet 2x5 (2nd set without PT assist) Supine Chin tucks x 10 5 sec hold, tingling into shoulders so adjusted pillow to under occiput but not neck Seated scapular retraction x 10 Supine horizontal ABD green 2x10 Supine diagonal pull down 2x 10 ea with opp arm stabilizing at 90 deg flexion Seated retraction + ER  green band x 10 Single arm ER with green (for full ROM) x 10 ea Standing ext 2x10 ea green  03/20/24: Seated posture correction Putty issued - red  Self care: shoulder heat brace  donned for patient Chin tucks x 6 pt reports pain in L scapula and tingling in R arm Seated horizontal ABD green - too difficult Supine horizontal ABD green 2x10 Supine diagonal pull down 2x 10 with opp arm  stabilizing at 90 deg flexion ROM measured Standing flex, scaption and ABD x 10 ea B Standing rows and ext 2x10 ea green                                                                                    PATIENT EDUCATION:  Education details: Educated patient on anatomy and physiology of current symptoms, prognosis, plan of care as well as initial self care strategies to promote recovery Person educated: Patient Education method: Explanation Education comprehension: verbalized understanding  HOME EXERCISE PROGRAM: Access Code: [email or web address] URL: [email or web address] Date: 03/20/2024 Prepared by: Mliss  Exercises - Supine Shoulder Flexion Overhead with Dowel  - 1 x daily - 7 x weekly - 1 sets - 3-5 reps - 10 hold - Seated Scapular Retraction  - 1 x daily - 7 x weekly - 1 sets - 10 reps - Supine Chin Tucks on Flat Ball  - 1 x daily - 7 x weekly - 2 sets - 10 reps - Seated Cervical Retraction  - 2 x daily - 7 x weekly - 1 sets - 10 reps - 5 hold - Standing Shoulder Row with Anchored Resistance  - 1 x daily - 4 x weekly - 1-3 sets - 10 reps - Shoulder Extension with Resistance  - 1 x daily - 7 x weekly - 1-3 sets - 10 reps - Standing Shoulder External Rotation with Resistance  - 1 x daily - 3 x weekly - 2 sets - 10 reps - Supine Shoulder Horizontal Abduction with Resistance  - 1 x daily - 3 x weekly - 2 sets - 10 reps - Standing Shoulder Flexion to 90 Degrees  - 1 x daily - 3 x weekly - 1-3 sets - 10 reps - 3 sec hold - Standing Shoulder Scaption  - 1 x daily - 3 x weekly - 1-3 sets - 10 reps - 3 sec hold - Shoulder Abduction - Thumbs Up  - 1 x daily - 3 x weekly - 1-3 sets - 10 reps - 3 sec hold  ASSESSMENT:  CLINICAL IMPRESSION: ***   Eval:Patient is a 53 y.o.  female who was seen today for physical therapy evaluation and treatment for neck and shoulder pain.  She denies neck pain but reports she was told her bilateral shoulder and UE pain was referred from the neck.  She reports her ROM has worsened in the last 3 months.  In the seated position her ROM is significantly limited (right > left) in all planes of movement but particularly with elevation right flexion 97, right abduction 63, left flexion 121, left abduction 83.  In supine (gravity-assisted) her ROM is better and less painful: right 108, left 150 degrees.  Limited strength and painful with all resisted movements.  Her grip strength is decreased and below norms for age/gender. She is severely impaired with ADLs especially writing, reaching shelves and performing some dressing tasks.  She would benefit from PT to address these deficits.   OBJECTIVE IMPAIRMENTS: decreased activity tolerance, decreased ROM, decreased strength, increased fascial restrictions, impaired perceived functional ability, impaired UE functional use, and pain.   ACTIVITY LIMITATIONS: carrying, lifting, sleeping, dressing, reach over  head, and hygiene/grooming  PARTICIPATION LIMITATIONS: meal prep, cleaning, laundry, driving, shopping, and community activity  PERSONAL FACTORS: Time since onset of injury/illness/exacerbation and 3+ comorbidities: Diabetes, age/gender putting pt at higher risk of adhesive capsulitis, multiple musculoskeletal/body regions affected are also affecting patient's functional outcome.   REHAB POTENTIAL: Good  CLINICAL DECISION MAKING: Evolving/moderate complexity  EVALUATION COMPLEXITY: Moderate   GOALS: Goals reviewed with patient? Yes  SHORT TERM GOALS: Target date: 03/01/2024    The patient will demonstrate knowledge of basic self care strategies and exercises to promote healing  Baseline:  Goal status: IN PROGRESS  2. The patient will have improved shoulder elevation ROM to at least 125  degrees needed for  reaching high shelves  Baseline:  Goal status: PARTIALLY MET 03/20/24  3.  Right and left shoulder internal rotation improved in order to reach behind the back to lumbar region for bathing, dressing and toileting Baseline:  Goal status: IN PROGRESS  4.  Improved grip strength on right to 26# and left to 18# needed for carrying groceries Baseline:  Goal status: IN PROGRESS  5.  PSFS rating with writing improved to 4 Baseline:  Goal status: IN PROGRESS   LONG TERM GOALS: Target date: 05/27/24   The patient will be independent in a safe self progression of a home exercise program to promote further recovery of function  Baseline:  Goal status: INITIAL  2.  The patient will have improved shoulder elevation ROM to at least 150 degrees needed for grooming/dressing purposes as well as reaching high shelves Baseline:  Goal status: INITIAL  3.  The patient will have grossly 4/5 strength needed to lift and lower a 3# object from a high shelf  Baseline:  Goal status: INITIAL  4.  PSFS rating with reaching behind the back for dressing improved to 4 Baseline:  Goal status: INITIAL  5.  Quick DASH improved to     68 % indicating improved function with less pain Baseline:  Goal status: INITIAL     PLAN:  PT FREQUENCY: 2x/week  PT DURATION: 8 weeks  PLANNED INTERVENTIONS: 97164- PT Re-evaluation, 97110-Therapeutic exercises, 97530- Therapeutic activity, 97112- Neuromuscular re-education, 97535- Self Care, 02859- Manual therapy, [phone]- Aquatic Therapy, [phone]- Electrical stimulation (unattended), [phone]- Electrical stimulation (manual), [id number]- Ultrasound, [id number]- Traction (mechanical), [id number]- Ionotophoresis 4mg /ml Dexamethasone , 79439 (1-2 muscles), 20561 (3+ muscles)- Dry Needling, Patient/Family education, Taping, Joint mobilization, Spinal mobilization, Cryotherapy, and Moist heat  PLAN FOR NEXT SESSION:  shoulder ROM bilaterally active- assistive and active;  graded exposure to strengthening shoulder, grip strengthening, manual therapy including joint mobs; modalities and taping as needed for pain control  Mliss Cummins, PT  04/17/24 10:24 AM Phone: [phone] Fax: [phone]

## 2024-04-17 NOTE — Telephone Encounter (Signed)
 2nd attempt to contact patient and left Vm for return call.  Paperwork has been received in office for incontinence supplies and there is no documentation on file. She will need a video or in person appointment to discuss.

## 2024-04-17 NOTE — Telephone Encounter (Signed)
 LM for pt regarding missed appt and letting her know of next appt on 11/12.

## 2024-04-23 ENCOUNTER — Encounter: Payer: Self-pay | Admitting: Family Medicine

## 2024-04-23 ENCOUNTER — Ambulatory Visit (HOSPITAL_BASED_OUTPATIENT_CLINIC_OR_DEPARTMENT_OTHER): Payer: MEDICAID | Admitting: Family Medicine

## 2024-04-23 DIAGNOSIS — N3946 Mixed incontinence: Secondary | ICD-10-CM | POA: Diagnosis not present

## 2024-04-23 DIAGNOSIS — N3281 Overactive bladder: Secondary | ICD-10-CM | POA: Diagnosis not present

## 2024-04-23 MED ORDER — SOLIFENACIN SUCCINATE 5 MG PO TABS
5.0000 mg | ORAL_TABLET | Freq: Every day | ORAL | 1 refills | Status: AC
Start: 2024-04-23 — End: ?
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
## 2024-04-23 NOTE — Therapy (Incomplete)
 OUTPATIENT PHYSICAL THERAPY CERVICAL TREATMENT    Patient Name: Lynn Martinez MRN: [id number] DOB:06/05/1971, 53 y.o., female Today's Date: 04/23/2024  END OF SESSION:        Past Medical History:  Diagnosis Date   Allergy    Anemia    Anxiety    Arthritis    knees (09/30/2015)   Bipolar disorder (HCC)    Depression    Diabetes mellitus without complication (HCC)    Fibroid    s/p myomectomy 2010, hysterectomy 2013   GERD (gastroesophageal reflux disease)    Grave's disease    Heart murmur    Hormone disorder    Hyperlipidemia    Hypertension    Hypothyroidism    Memory loss    brain Fog per pt related to thyroid  condition   Migraine     otc med prn   PCOS (polycystic ovarian syndrome)    PMS (premenstrual syndrome)    PTSD (post-traumatic stress disorder)    Seasonal allergies    Thyroid  cancer (HCC) 2005   PAST HX   Thyroid  disease    Type II diabetes mellitus (HCC)    Vertigo    Past Surgical History:  Procedure Laterality Date   ABDOMINAL HYSTERECTOMY  10/11/2011   Procedure: HYSTERECTOMY ABDOMINAL;  Surgeon: Curlee VEAR Guan, MD;  Location: [facility];  Service: Gynecology;  Laterality: N/A;  With Repair of serosa.   APPENDECTOMY     CHOLECYSTECTOMY N/A 09/06/2013   Procedure: LAPAROSCOPIC CHOLECYSTECTOMY WITH INTRAOPERATIVE CHOLANGIOGRAM;  Surgeon: Deward GORMAN Curvin DOUGLAS, MD;  Location: [facility];  Service: General;  Laterality: N/A;   COLONOSCOPY     DIAGNOSTIC LAPAROSCOPY     DILATION AND CURETTAGE OF UTERUS     LAPAROSCOPIC APPENDECTOMY N/A 04/10/2021   Procedure: APPENDECTOMY LAPAROSCOPIC;  Surgeon: Sheldon Standing, MD;  Location: [facility];  Service: General;  Laterality: N/A;   MYOMECTOMY  06/13/2008   LAPAROSCOPY   TOTAL THYROIDECTOMY  06/13/2004   UMBILICAL HERNIA REPAIR  06/13/1980   Patient Active Problem List   Diagnosis Date Noted   Long term current use of oral hypoglycemic drug 02/07/2023   PAD (peripheral artery disease)  07/12/2022   Hyperlipidemia associated with type 2 diabetes mellitus (HCC) 07/12/2022   Pain in both feet 12/06/2021   Generalized anxiety disorder 06/21/2021   Nightmare 06/21/2021   Overactive bladder 08/20/2020   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   Heme positive stool 07/15/2020   Morbid obesity with BMI of 40.0-44.9, adult (HCC) 07/09/2020   Bipolar 1 disorder, depressed, moderate (HCC) 02/10/2020   Breast pain, left 06/11/2019   Radiculopathy, cervical region 11/21/2018   Carpal tunnel syndrome, right upper limb 10/08/2018   Carpal tunnel syndrome, left upper limb 10/08/2018   Hypertension 09/30/2015   Type 2 diabetes mellitus with hyperglycemia, with long-term current use of insulin  (HCC) 09/30/2015   S/P cholecystectomy 09/05/2013   Borderline personality disorder (HCC) 03/14/2012    Class: Chronic   OCD (obsessive compulsive disorder) 03/14/2012    Class: Chronic   PTSD (post-traumatic stress disorder) 03/14/2012    Class: Chronic   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    Class: Chronic   Lumbago without sciatica 03/12/2012   Major depression, recurrent 03/08/2012    Class: Chronic   Snoring 12/02/2011   PCOS (polycystic ovarian syndrome) 09/05/2011   Hypothyroidism 08/13/2010   Vitamin D  deficiency 08/13/2010   ANEMIA-NOS 08/13/2010    PCP: Delbert Clam MD  REFERRING PROVIDER: Delores Army POUR PA-C  REFERRING DIAG: neck  and shoulder pain  THERAPY DIAG: ncck pain; shoulder pain;weakness  Rationale for Evaluation and Treatment: Rehabilitation  ONSET DATE: off/on since January but worsened, last 3 months shoulder stiffness  SUBJECTIVE:                                                                                                                                                                                                         SUBJECTIVE STATEMENT: ***  Hand dominance: Right  PERTINENT HISTORY:  Husband recently passed away 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 Yes NPRS scale:7/10 R > L Pain location: bil anterior shoulders; shooting pain to fingertips right worse than left Pain orientation: Bilateral  PAIN TYPE: aching, burning, and shooting Pain description: constant  Aggravating factors: mornings; all ranges of movement with the shoulders bring on pain and sharpness in arms (no neck movements cause the pain) Relieving factors: rest   PRECAUTIONS: None   WEIGHT BEARING RESTRICTIONS: No  FALLS:  Has patient fallen in last 6 months? 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  DIAGNOSTIC FINDINGS:  X-ray cervical =bone spurs   PATIENT SURVEYS: Quick DASH= 81.8% indicating severe to extreme difficulty with ADLs  The Patient-Specific Functional Scale  Initial:  I am going to ask you to identify up to 3 important activities that you are unable to do or are having difficulty with as a result of this problem.  Today are there any activities that you are unable to do or having difficulty with because of this?  (Patient shown scale and patient rated each activity)  Follow up: When you first came in you had difficulty performing these activities.  Today do you still have difficulty?  Patient-Specific activity scoring scheme (Point to one number):  0 1 2 3 4 5 6 7 8 9  10 Unable  Able to perform To perform                                                                                                    activity  at the same Activity         Level as before                                                                                                                       Injury or problem  Activity        writing                                                                         Initial:        3                2.      Reaching behind back to put on a bra                       Initial:         1-2      3. Reaching shelves and into the fridge                              Initial: 3                            COGNITION: Overall cognitive status: Within functional limits for tasks assessed  POSTURE:  moderately rounded shoulders  CERVICAL ROM:   Active ROM A/PROM (deg) eval  Flexion WFLs  Extension 40  Right lateral flexion 30  Left lateral flexion 30  Right rotation   Left rotation    (Blank rows = not tested)  UPPER EXTREMITY ROM: supine active: right flex 108, left 150; supine active-assisted flexion: right 130, left 155  03/20/24  Supine Flex R 130   L 155  Active ROM Right eval Left eval Right 03/20/24 Left 03/20/24  Shoulder flexion 97 121 125 seated 122 seated  Shoulder extension 48 pain 55    Shoulder abduction 63 83    Shoulder adduction      Shoulder extension  Shoulder internal rotation Right buttock  Radial styloid to L5    Shoulder external rotation 54 88    Elbow flexion      Elbow extension      Wrist flexion      Wrist extension      Wrist ulnar deviation      Wrist radial deviation      Wrist pronation      Wrist supination       (Blank rows = not tested)  UPPER EXTREMITY MMT:  MMT Right eval Left eval  Shoulder flexion 3- 3-  Shoulder extension    Shoulder abduction 2+ 2+  Shoulder adduction    Shoulder extension    Shoulder internal rotation 3 3  Shoulder external rotation 3 3  Middle trapezius    Lower trapezius    Elbow flexion    Elbow extension    Wrist flexion    Wrist extension    Wrist ulnar deviation    Wrist radial  deviation    Wrist pronation    Wrist supination    Grip strength 23# 16#   (Blank rows = not tested)   02/21/24 negative ULTT   TREATMENT DATE:  04/24/24: UBE alternating fwd/bwd x 6 min PT present to discuss status 90/90 lat pull over with 10# PPT and  bracing feet on 20 inch RITFIT 4x5 Supine Chin tucks x 10 5 sec hold, tingling into shoulders so adjusted pillow to under occiput but not neck Seated chin tuck x 10 Supine horizontal ABD green 2x10 Prone T and Y - did not tolerate SL trio: ER 2# x 10, flex to 90 deg 1# x 10, horizontal ABD 1# x 10 Single arm ER with green (for full ROM) 2x 10 ea Standing ext 2x10 ea green Standing row green 2x10   04/03/24: UBE alternating fwd/bwd x 6 min PT present to discuss status 90/90 lat pull over with 10# PPT and  bracing feet on 20 inch RITFIT 4x5 Supine Chin tucks x 10 5 sec hold, tingling into shoulders so adjusted pillow to under occiput but not neck Seated chin tuck x 10 Supine horizontal ABD green 2x10 Prone T and Y - did not tolerate SL trio: ER 2# x 10, flex to 90 deg 1# x 10, horizontal ABD 1# x 10 Single arm ER with green (for full ROM) 2x 10 ea Standing ext 2x10 ea green Standing row green 2x10  03/28/24: UBE alternating fwd/bwd x 6 min PT present to discuss status Supine AA flexion with dowel + 2# x 20, then x10 with 5 second hold - cues for PPT to anchor lats - reports no pain with this 90/90 lat pull over with 10# PPT and PT bracing feet 2x5 (2nd set without PT assist) Supine Chin tucks x 10 5 sec hold, tingling into shoulders so adjusted pillow to under occiput but not neck Seated scapular retraction x 10 Supine horizontal ABD green 2x10 Supine diagonal pull down 2x 10 ea with opp arm stabilizing at 90 deg flexion Seated retraction + ER  green band x 10 Single arm ER with green (for full ROM) x 10 ea Standing ext 2x10 ea green  03/20/24: Seated posture correction Putty issued - red  Self care: shoulder heat brace  donned for patient Chin tucks x 6 pt reports pain in L scapula and tingling in R arm Seated horizontal ABD green - too difficult Supine horizontal ABD green 2x10 Supine diagonal pull down 2x 10 with opp arm  stabilizing at 90 deg flexion ROM measured Standing flex, scaption and ABD x 10 ea B Standing rows and ext 2x10 ea green                                                                                    PATIENT EDUCATION:  Education details: Educated patient on anatomy and physiology of current symptoms, prognosis, plan of care as well as initial self care strategies to promote recovery Person educated: Patient Education method: Explanation Education comprehension: verbalized understanding  HOME EXERCISE PROGRAM: Access Code: [email or web address] URL: [email or web address] Date: 03/20/2024 Prepared by: Mliss  Exercises - Supine Shoulder Flexion Overhead with Dowel  - 1 x daily - 7 x weekly - 1 sets - 3-5 reps - 10 hold - Seated Scapular Retraction  - 1 x daily - 7 x weekly - 1 sets - 10 reps - Supine Chin Tucks on Flat Ball  - 1 x daily - 7 x weekly - 2 sets - 10 reps - Seated Cervical Retraction  - 2 x daily - 7 x weekly - 1 sets - 10 reps - 5 hold - Standing Shoulder Row with Anchored Resistance  - 1 x daily - 4 x weekly - 1-3 sets - 10 reps - Shoulder Extension with Resistance  - 1 x daily - 7 x weekly - 1-3 sets - 10 reps - Standing Shoulder External Rotation with Resistance  - 1 x daily - 3 x weekly - 2 sets - 10 reps - Supine Shoulder Horizontal Abduction with Resistance  - 1 x daily - 3 x weekly - 2 sets - 10 reps - Standing Shoulder Flexion to 90 Degrees  - 1 x daily - 3 x weekly - 1-3 sets - 10 reps - 3 sec hold - Standing Shoulder Scaption  - 1 x daily - 3 x weekly - 1-3 sets - 10 reps - 3 sec hold - Shoulder Abduction - Thumbs Up  - 1 x daily - 3 x weekly - 1-3 sets - 10 reps - 3 sec hold  ASSESSMENT:  CLINICAL IMPRESSION: ***   Eval:Patient is a 53 y.o.  female who was seen today for physical therapy evaluation and treatment for neck and shoulder pain.  She denies neck pain but reports she was told her bilateral shoulder and UE pain was referred from the neck.  She reports her ROM has worsened in the last 3 months.  In the seated position her ROM is significantly limited (right > left) in all planes of movement but particularly with elevation right flexion 97, right abduction 63, left flexion 121, left abduction 83.  In supine (gravity-assisted) her ROM is better and less painful: right 108, left 150 degrees.  Limited strength and painful with all resisted movements.  Her grip strength is decreased and below norms for age/gender. She is severely impaired with ADLs especially writing, reaching shelves and performing some dressing tasks.  She would benefit from PT to address these deficits.   OBJECTIVE IMPAIRMENTS: decreased activity tolerance, decreased ROM, decreased strength, increased fascial restrictions, impaired perceived functional ability, impaired UE functional use, and pain.   ACTIVITY LIMITATIONS: carrying, lifting, sleeping, dressing, reach over  head, and hygiene/grooming  PARTICIPATION LIMITATIONS: meal prep, cleaning, laundry, driving, shopping, and community activity  PERSONAL FACTORS: Time since onset of injury/illness/exacerbation and 3+ comorbidities: Diabetes, age/gender putting pt at higher risk of adhesive capsulitis, multiple musculoskeletal/body regions affected are also affecting patient's functional outcome.   REHAB POTENTIAL: Good  CLINICAL DECISION MAKING: Evolving/moderate complexity  EVALUATION COMPLEXITY: Moderate   GOALS: Goals reviewed with patient? Yes  SHORT TERM GOALS: Target date: 03/01/2024    The patient will demonstrate knowledge of basic self care strategies and exercises to promote healing  Baseline:  Goal status: IN PROGRESS  2. The patient will have improved shoulder elevation ROM to at least 125  degrees needed for  reaching high shelves  Baseline:  Goal status: PARTIALLY MET 03/20/24  3.  Right and left shoulder internal rotation improved in order to reach behind the back to lumbar region for bathing, dressing and toileting Baseline:  Goal status: IN PROGRESS  4.  Improved grip strength on right to 26# and left to 18# needed for carrying groceries Baseline:  Goal status: IN PROGRESS  5.  PSFS rating with writing improved to 4 Baseline:  Goal status: IN PROGRESS   LONG TERM GOALS: Target date: 05/27/24   The patient will be independent in a safe self progression of a home exercise program to promote further recovery of function  Baseline:  Goal status: INITIAL  2.  The patient will have improved shoulder elevation ROM to at least 150 degrees needed for grooming/dressing purposes as well as reaching high shelves Baseline:  Goal status: INITIAL  3.  The patient will have grossly 4/5 strength needed to lift and lower a 3# object from a high shelf  Baseline:  Goal status: INITIAL  4.  PSFS rating with reaching behind the back for dressing improved to 4 Baseline:  Goal status: INITIAL  5.  Quick DASH improved to     68 % indicating improved function with less pain Baseline:  Goal status: INITIAL     PLAN:  PT FREQUENCY: 2x/week  PT DURATION: 8 weeks  PLANNED INTERVENTIONS: 97164- PT Re-evaluation, 97110-Therapeutic exercises, 97530- Therapeutic activity, 97112- Neuromuscular re-education, 97535- Self Care, [id number]- Manual therapy, [id number]- Aquatic Therapy, [phone]- Electrical stimulation (unattended), [phone]- Electrical stimulation (manual), [id number]- Ultrasound, [id number]- Traction (mechanical), [id number]- Ionotophoresis 4mg /ml Dexamethasone , 79439 (1-2 muscles), 20561 (3+ muscles)- Dry Needling, Patient/Family education, Taping, Joint mobilization, Spinal mobilization, Cryotherapy, and Moist heat  PLAN FOR NEXT SESSION:  shoulder ROM bilaterally active- assistive and active;  graded exposure to strengthening shoulder, grip strengthening, manual therapy including joint mobs; modalities and taping as needed for pain control  Mliss Cummins, PT  04/23/24 6:33 PM Phone: [phone] Fax: [phone]

## 2024-04-23 NOTE — Patient Instructions (Signed)
 VISIT SUMMARY:  Lynn Martinez, you visited today due to ongoing issues with urinary incontinence, experiencing both urge and stress types. You have been dealing with these symptoms for over a year and a half, and previous medication provided only slight relief.  YOUR PLAN:  -MIXED URINARY INCONTINENCE: Mixed urinary incontinence means you experience both urge incontinence, which is a strong, uncontrollable urge to urinate, and stress incontinence, which occurs with activities like sneezing or coughing. We have prescribed VESIcare to help manage your symptoms. Additionally, we have submitted a form for incontinence supplies and sent your prescription to the pharmacy.  INSTRUCTIONS:  Please follow up with us  if you experience any side effects from the new medication or if your symptoms do not improve. Make sure to pick up your prescription from the pharmacy and use the incontinence supplies as needed.

## 2024-04-24 ENCOUNTER — Ambulatory Visit: Payer: MEDICAID | Admitting: Physical Therapy

## 2024-05-01 ENCOUNTER — Ambulatory Visit: Payer: MEDICAID | Admitting: Physical Therapy

## 2024-05-01 ENCOUNTER — Other Ambulatory Visit: Payer: Self-pay | Admitting: Critical Care Medicine

## 2024-05-01 DIAGNOSIS — R0789 Other chest pain: Secondary | ICD-10-CM

## 2024-05-07 ENCOUNTER — Ambulatory Visit: Payer: MEDICAID | Admitting: Physical Therapy

## 2024-05-27 ENCOUNTER — Ambulatory Visit: Payer: MEDICAID | Admitting: Family Medicine

## 2024-06-17 ENCOUNTER — Ambulatory Visit: Payer: Self-pay

## 2024-06-17 NOTE — Telephone Encounter (Signed)
" °  FYI Only or Action Required?: FYI only for provider: appointment scheduled on 06/19/2024.  Patient was last seen in primary care on 04/23/2024 by Delbert Clam, MD.  Called Nurse Triage reporting Shoulder Pain.  Symptoms began several months ago.  Interventions attempted: Other: massage - which helps .  Symptoms are: gradually worsening.  Triage Disposition: See Within 3 Days in Office (overriding See HCP Within 4 Hours (Or PCP Triage))  Patient/caregiver understands and will follow disposition?: Yes         Reason for Disposition  Weakness (i.e., loss of strength) in hand or fingers  (Exception: Not truly weak; hand feels weak because of pain.)  Answer Assessment - Initial Assessment Questions Patient reports hurts to reach any direction , really hurts when reaches to side doesn't have same movement. Pain that shoots through arm down to palm of hand sometimes fingertips , both hands, worse in right side. Is not sure what is causing this.  Saw orthopedics they took xray neck and shoulders. They saw bon spurs and inflammation. They wanted physical therapy as next step. Did 3 sessions was suppose to do 6 but after 3 in pain. Stopped rehab was having neck pain with it was told if didn't do all physical therapy insurance wouldn't cover MRI. Bilateral shoulder pain is movement depending expect when first wakes up. Its with reaching.  Pain level for shoulder 8/10 , severe pain medication calms it. Weakness in hands and fingers relates this to neuropathy. Massaging the shoulder where the bone shoulder meet and gets tingling feeling. Patient wants to see PCP to discuss the next steps on getting  MRI done. Booked next opening with PCP 06/19/2024 booked outside disposition as this has been going on 6 months.     1. ONSET: When did the pain start?     6 months ago getting worse 2. LOCATION: Where is the pain located?     Both shoulder sometimes down arm into hands  3. PAIN: How bad is  the pain? (Scale 1-10; or mild, moderate, severe)     8/10  started out as a 5/10 thought would get better but getting worse wants wants to know the next steps  5. CAUSE: What do you think is causing the shoulder pain?     Unknown but has been for 6 months  6. OTHER SYMPTOMS: Do you have any other symptoms? (e.g., neck pain, swelling, rash, fever, numbness, weakness)     Reports weakness in hands that was before the shoulder pain  Patient denies chest pain, shortness of breath, fever dizziness  Protocols used: Shoulder Pain-A-AH Copied from CRM #[id number]. Topic: Clinical - Red Word Triage >> Jun 17, 2024  8:44 AM Wess RAMAN wrote: Red Word that prompted transfer to Nurse Triage: Shoulder pain in both shoulders for 6 months. Pain level 8 when doing daily activities. Harder to reach for things.  Would like to schedule annual wellness visit. "

## 2024-06-19 ENCOUNTER — Ambulatory Visit: Payer: MEDICAID | Admitting: Family Medicine

## 2024-06-19 ENCOUNTER — Encounter: Payer: Self-pay | Admitting: Family Medicine

## 2024-06-19 VITALS — BP 113/77 | HR 73 | Ht 66.0 in | Wt 257.6 lb

## 2024-06-19 DIAGNOSIS — E785 Hyperlipidemia, unspecified: Secondary | ICD-10-CM

## 2024-06-19 DIAGNOSIS — F3162 Bipolar disorder, current episode mixed, moderate: Secondary | ICD-10-CM | POA: Diagnosis not present

## 2024-06-19 DIAGNOSIS — E559 Vitamin D deficiency, unspecified: Secondary | ICD-10-CM

## 2024-06-19 DIAGNOSIS — L304 Erythema intertrigo: Secondary | ICD-10-CM

## 2024-06-19 DIAGNOSIS — M25511 Pain in right shoulder: Secondary | ICD-10-CM | POA: Diagnosis not present

## 2024-06-19 DIAGNOSIS — E890 Postprocedural hypothyroidism: Secondary | ICD-10-CM

## 2024-06-19 DIAGNOSIS — Z7985 Long-term (current) use of injectable non-insulin antidiabetic drugs: Secondary | ICD-10-CM | POA: Diagnosis not present

## 2024-06-19 DIAGNOSIS — N3281 Overactive bladder: Secondary | ICD-10-CM

## 2024-06-19 DIAGNOSIS — M25512 Pain in left shoulder: Secondary | ICD-10-CM | POA: Diagnosis not present

## 2024-06-19 DIAGNOSIS — G43909 Migraine, unspecified, not intractable, without status migrainosus: Secondary | ICD-10-CM

## 2024-06-19 DIAGNOSIS — E1165 Type 2 diabetes mellitus with hyperglycemia: Secondary | ICD-10-CM

## 2024-06-19 DIAGNOSIS — Z1231 Encounter for screening mammogram for malignant neoplasm of breast: Secondary | ICD-10-CM

## 2024-06-19 DIAGNOSIS — E1169 Type 2 diabetes mellitus with other specified complication: Secondary | ICD-10-CM

## 2024-06-19 DIAGNOSIS — G8929 Other chronic pain: Secondary | ICD-10-CM

## 2024-06-19 MED ORDER — PROPRANOLOL HCL 40 MG PO TABS: 40.0000 mg | ORAL_TABLET | Freq: Two times a day (BID) | ORAL | 1 refills | Status: AC

## 2024-06-19 MED ORDER — ATORVASTATIN CALCIUM 10 MG PO TABS: ORAL_TABLET | ORAL | 1 refills | Status: AC

## 2024-06-19 MED ORDER — DICLOFENAC SODIUM 1 % EX GEL: 2.0000 g | Freq: Four times a day (QID) | CUTANEOUS | 1 refills | Status: AC

## 2024-06-19 MED ORDER — VITAMIN D (ERGOCALCIFEROL) 1.25 MG (50000 UNIT) PO CAPS: 50000.0000 [IU] | ORAL_CAPSULE | ORAL | 1 refills | Status: AC
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
## 2024-06-19 NOTE — Patient Instructions (Signed)
 Sent Referral to [facility] Dr Awilda Seton phone # [phone] Fax # [phone] Address [address] [place] [address].

## 2024-06-21 ENCOUNTER — Other Ambulatory Visit: Payer: Self-pay | Admitting: Internal Medicine

## 2024-06-27 ENCOUNTER — Encounter: Payer: Self-pay | Admitting: Internal Medicine

## 2024-06-27 ENCOUNTER — Ambulatory Visit (INDEPENDENT_AMBULATORY_CARE_PROVIDER_SITE_OTHER): Payer: MEDICAID | Admitting: Internal Medicine

## 2024-06-27 ENCOUNTER — Ambulatory Visit: Payer: MEDICAID | Attending: Family Medicine

## 2024-06-27 VITALS — BP 138/82 | HR 68 | Ht 66.0 in | Wt 257.0 lb

## 2024-06-27 DIAGNOSIS — Z794 Long term (current) use of insulin: Secondary | ICD-10-CM

## 2024-06-27 DIAGNOSIS — E559 Vitamin D deficiency, unspecified: Secondary | ICD-10-CM

## 2024-06-27 DIAGNOSIS — E890 Postprocedural hypothyroidism: Secondary | ICD-10-CM

## 2024-06-27 DIAGNOSIS — E1165 Type 2 diabetes mellitus with hyperglycemia: Secondary | ICD-10-CM

## 2024-06-27 LAB — POCT GLYCOSYLATED HEMOGLOBIN (HGB A1C): Hemoglobin A1C: 8 % — AB (ref 4.0–5.6)

## 2024-06-27 MED ORDER — DAPAGLIFLOZIN PROPANEDIOL 10 MG PO TABS: 10.0000 mg | ORAL_TABLET | Freq: Every day | ORAL | 3 refills | Status: AC

## 2024-06-27 MED ORDER — SEMAGLUTIDE (2 MG/DOSE) 8 MG/3ML ~~LOC~~ SOPN: 2.0000 mg | PEN_INJECTOR | SUBCUTANEOUS | 3 refills | Status: AC

## 2024-06-27 MED ORDER — GLIPIZIDE 5 MG PO TABS: 5.0000 mg | ORAL_TABLET | Freq: Every day | ORAL | 3 refills | Status: AC

## 2024-06-27 NOTE — Patient Instructions (Addendum)
 Continue Ozempic  2 mg weekly Continue Farxiga  10 mg daily  Start Glipizide  5 mg, 1 tablet before the largest meal of the day      HOW TO TREAT LOW BLOOD SUGARS (Blood sugar LESS THAN 70 MG/DL) Please follow the RULE OF 15 for the treatment of hypoglycemia treatment (when your (blood sugars are less than 70 mg/dL)   STEP 1: Take 15 grams of carbohydrates when your blood sugar is low, which includes:  3-4 GLUCOSE TABS  OR 3-4 OZ OF JUICE OR REGULAR SODA OR ONE TUBE OF GLUCOSE GEL    STEP 2: RECHECK blood sugar in 15 MINUTES STEP 3: If your blood sugar is still low at the 15 minute recheck --> then, go back to STEP 1 and treat AGAIN with another 15 grams of carbohydrates.;
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
## 2024-06-28 LAB — T3: T3, Total: 98 ng/dL (ref 71–180)

## 2024-06-28 LAB — LP+NON-HDL CHOLESTEROL
Cholesterol, Total: 179 mg/dL (ref 100–199)
HDL: 59 mg/dL
LDL Chol Calc (NIH): 103 mg/dL — ABNORMAL HIGH (ref 0–99)
Total Non-HDL-Chol (LDL+VLDL): 120 mg/dL (ref 0–129)
Triglycerides: 93 mg/dL (ref 0–149)
VLDL Cholesterol Cal: 17 mg/dL (ref 5–40)

## 2024-06-28 LAB — T4, FREE: Free T4: 0.82 ng/dL (ref 0.82–1.77)

## 2024-06-28 LAB — HEMOGLOBIN A1C
Est. average glucose Bld gHb Est-mCnc: 171 mg/dL
Hgb A1c MFr Bld: 7.6 % — ABNORMAL HIGH (ref 4.8–5.6)

## 2024-06-28 LAB — MICROALBUMIN / CREATININE URINE RATIO
Creatinine, Urine: 275.8 mg/dL
Microalb/Creat Ratio: 5 mg/g{creat} (ref 0–29)
Microalbumin, Urine: 14.3 ug/mL

## 2024-06-28 LAB — TSH: TSH: 10.7 u[IU]/mL — ABNORMAL HIGH (ref 0.450–4.500)

## 2024-06-28 LAB — VITAMIN D 25 HYDROXY (VIT D DEFICIENCY, FRACTURES): Vit D, 25-Hydroxy: 18.8 ng/mL — ABNORMAL LOW (ref 30.0–100.0)

## 2024-07-01 ENCOUNTER — Ambulatory Visit: Payer: Self-pay | Admitting: Family Medicine

## 2024-07-02 ENCOUNTER — Ambulatory Visit: Payer: MEDICAID | Admitting: Internal Medicine

## 2024-07-02 MED ORDER — LEVOTHYROXINE SODIUM 175 MCG PO TABS: 175.0000 ug | ORAL_TABLET | Freq: Every day | ORAL | 3 refills | Status: AC

## 2024-07-02 NOTE — Telephone Encounter (Signed)
-----   Message from Corrina Sabin, MD sent at 07/01/2024  5:41 PM EST ----- Forwarding thyroid  panel results.

## 2024-07-03 ENCOUNTER — Institutional Professional Consult (permissible substitution): Payer: MEDICAID | Admitting: Nurse Practitioner

## 2024-07-03 ENCOUNTER — Other Ambulatory Visit: Payer: Self-pay | Admitting: Family Medicine

## 2024-07-03 DIAGNOSIS — G8929 Other chronic pain: Secondary | ICD-10-CM

## 2024-07-08 ENCOUNTER — Other Ambulatory Visit: Payer: Self-pay | Admitting: Family Medicine

## 2024-07-08 DIAGNOSIS — G8929 Other chronic pain: Secondary | ICD-10-CM

## 2024-07-09 ENCOUNTER — Ambulatory Visit: Payer: MEDICAID | Admitting: Family Medicine

## 2024-07-10 ENCOUNTER — Institutional Professional Consult (permissible substitution): Payer: MEDICAID | Admitting: Nurse Practitioner

## 2024-07-12 ENCOUNTER — Ambulatory Visit: Payer: MEDICAID | Admitting: Nurse Practitioner

## 2024-07-12 ENCOUNTER — Encounter: Payer: Self-pay | Admitting: Nurse Practitioner

## 2024-07-12 VITALS — BP 129/76 | HR 69 | Temp 98.5°F | Ht 66.0 in | Wt 248.0 lb

## 2024-07-12 DIAGNOSIS — Z794 Long term (current) use of insulin: Secondary | ICD-10-CM | POA: Diagnosis not present

## 2024-07-12 DIAGNOSIS — Z7984 Long term (current) use of oral hypoglycemic drugs: Secondary | ICD-10-CM

## 2024-07-12 DIAGNOSIS — E890 Postprocedural hypothyroidism: Secondary | ICD-10-CM

## 2024-07-12 DIAGNOSIS — E282 Polycystic ovarian syndrome: Secondary | ICD-10-CM

## 2024-07-12 DIAGNOSIS — Z6841 Body Mass Index (BMI) 40.0 and over, adult: Secondary | ICD-10-CM

## 2024-07-12 DIAGNOSIS — E1169 Type 2 diabetes mellitus with other specified complication: Secondary | ICD-10-CM | POA: Diagnosis not present

## 2024-07-12 DIAGNOSIS — E785 Hyperlipidemia, unspecified: Secondary | ICD-10-CM

## 2024-07-12 DIAGNOSIS — Z7985 Long-term (current) use of injectable non-insulin antidiabetic drugs: Secondary | ICD-10-CM

## 2024-07-12 DIAGNOSIS — E1165 Type 2 diabetes mellitus with hyperglycemia: Secondary | ICD-10-CM | POA: Diagnosis not present

## 2024-07-12 DIAGNOSIS — I10 Essential (primary) hypertension: Secondary | ICD-10-CM | POA: Diagnosis not present

## 2024-07-12 NOTE — Progress Notes (Signed)
 " Office: [phone]  /  Fax: [phone]   Initial Visit  Lynn Martinez Lynn Martinez was seen in clinic today to evaluate for obesity. She is interested in losing weight to improve overall health and reduce the risk of weight related complications. She presents today to review program treatment options, initial physical assessment, and evaluation.     She was referred by: PCP  When asked what else they would like to accomplish? She states: Adopt a healthier eating pattern and lifestyle, Improve energy levels and physical activity, Improve existing medical conditions, Reduce number of medications, and Improve quality of life   When asked how has your weight affected you? She states: Contributed to medical problems, Contributed to orthopedic problems or mobility issues, Having fatigue, and Having poor endurance  Some associated conditions: HTN, PAD, HLD, DMT2, hypothyroidism, PCOS, CTS, anemia, bipolar 1 disorder, borderline personality disorder, insomnia, OCD, B feet pain, vit d def  Contributing factors: family history of obesity, consumption of processed foods, moderate to high levels of stress, reduced physical activity, chronic skipping of meals, mental health problems, menopause, and hectic pace of life  Weight promoting medications identified: None  Current nutrition plan: None  Current level of physical activity: None  Current or previous pharmacotherapy: GLP-1  Response to medication: denies side effects-has lost 10 pounds since starting Ozempic  over the past 6 months.    Past medical history includes:   Past Medical History:  Diagnosis Date   Allergy    Anemia    Anxiety    Arthritis    knees (09/30/2015)   Bipolar disorder (HCC)    Depression    Diabetes mellitus without complication (HCC)    Fibroid    s/p myomectomy 2010, hysterectomy 2013   GERD (gastroesophageal reflux disease)    Grave's disease    Heart murmur    Hormone disorder    Hyperlipidemia     Hypertension    Hypothyroidism    Memory loss    brain Fog per pt related to thyroid  condition   Migraine     otc med prn   PCOS (polycystic ovarian syndrome)    PMS (premenstrual syndrome)    PTSD (post-traumatic stress disorder)    Seasonal allergies    Thyroid  cancer (HCC) 2005   PAST HX   Thyroid  disease    Type II diabetes mellitus (HCC)    Vertigo      Objective:   BP 129/76   Pulse 69   Temp 98.5 F (36.9 C)   Ht 5' 6 (1.676 m)   Wt 248 lb (112.5 kg)   LMP 09/20/2011   SpO2 100%   BMI 40.03 kg/m  She was weighed on the bioimpedance scale: Body mass index is 40.03 kg/m.  Peak Weight:330 lbs , Body Fat%:47.9%, Visceral Fat Rating:14, Weight trend over the last 12 months: Unchanged  General:  Alert, oriented and cooperative. Patient is in no acute distress.  Respiratory: Normal respiratory effort, no problems with respiration noted   Gait: able to ambulate independently  Mental Status: Normal mood and affect. Normal behavior. Normal judgment and thought content.   DIAGNOSTIC DATA REVIEWED:  BMET    Component Value Date/Time   NA 142 04/04/2024 1145   NA 144 06/29/2023 1218   K 4.6 04/04/2024 1145   CL 105 04/04/2024 1145   CO2 25 04/04/2024 1145   GLUCOSE 88 04/04/2024 1145   BUN 13 04/04/2024 1145   BUN 8 06/29/2023 1218   CREATININE 0.89 04/04/2024 1145  CALCIUM  9.8 04/04/2024 1145   GFRNONAA >60 04/04/2024 1145   GFRAA 108 07/09/2020 0951   Lab Results  Component Value Date   HGBA1C 7.6 (H) 06/27/2024   HGBA1C 5.9 (H) 03/08/2012   No results found for: INSULIN  CBC    Component Value Date/Time   WBC 7.3 04/04/2024 1145   RBC 4.91 04/04/2024 1145   HGB 13.3 04/04/2024 1145   HGB 13.0 06/29/2023 1218   HCT 41.8 04/04/2024 1145   HCT 41.4 06/29/2023 1218   PLT 476 (H) 04/04/2024 1145   PLT 496 (H) 06/29/2023 1218   MCV 85.1 04/04/2024 1145   MCV 86 06/29/2023 1218   MCH 27.1 04/04/2024 1145   MCHC 31.8 04/04/2024 1145   RDW 14.3  04/04/2024 1145   RDW 13.6 06/29/2023 1218   Iron/TIBC/Ferritin/ %Sat    Component Value Date/Time   IRON 20 (L) 12/02/2011 1625   FERRITIN 10.5 12/02/2011 1625   IRONPCTSAT 5.0 (L) 12/02/2011 1625   Lipid Panel     Component Value Date/Time   CHOL 179 06/27/2024 0855   TRIG 93 06/27/2024 0855   HDL 59 06/27/2024 0855   CHOLHDL 2.6 06/29/2023 1218   CHOLHDL 3 05/20/2021 1040   VLDL 31.0 05/20/2021 1040   LDLCALC 103 (H) 06/27/2024 0855   Hepatic Function Panel     Component Value Date/Time   PROT 7.8 04/04/2024 1145   PROT 7.3 06/29/2023 1218   ALBUMIN 4.3 04/04/2024 1145   ALBUMIN 4.7 06/29/2023 1218   AST 20 04/04/2024 1145   ALT 20 04/04/2024 1145   ALKPHOS 58 04/04/2024 1145   BILITOT 0.2 04/04/2024 1145   BILITOT 0.2 06/29/2023 1218   BILIDIR 0.5 (H) 09/05/2013 1544   IBILI 0.4 09/05/2013 1544      Component Value Date/Time   TSH 10.700 (H) 06/27/2024 0855     Assessment and Plan:   Primary hypertension Taking Inderal  40mg  BID  Hyperlipidemia associated with type 2 diabetes mellitus (HCC) She is currently taking Lipitor 10mg .  Managed by PCP  Lab Results  Component Value Date   CHOL 179 06/27/2024   HDL 59 06/27/2024   LDLCALC 103 (H) 06/27/2024   TRIG 93 06/27/2024   CHOLHDL 2.6 06/29/2023   Lab Results  Component Value Date   ALT 20 04/04/2024   AST 20 04/04/2024   ALKPHOS 58 04/04/2024   BILITOT 0.2 04/04/2024    Postoperative hypothyroidism Taking Synthroid  175 mcg.  Managed by endo  PCOS (polycystic ovarian syndrome) Will obtain fasting labs at next visit  Type 2 diabetes mellitus with hyperglycemia, with long-term current use of insulin  (HCC) She is currently taking Farxiga  10mg , glipizide  5mg , Ozempic  2mg .  Managed by endo-saw last on 06/27/24  Lab Results  Component Value Date   HGBA1C 7.6 (H) 06/27/2024   HGBA1C 8.0 (A) 06/27/2024   HGBA1C 7.5 (A) 06/27/2023   Lab Results  Component Value Date   LDLCALC 103 (H) 06/27/2024    CREATININE 0.89 04/04/2024     Morbid obesity with BMI of 40.0-44.9, adult (HCC)        Obesity Treatment / Action Plan:  Patient will work on garnering support from family and friends to begin weight loss journey. Will work on eliminating or reducing the presence of highly palatable, calorie dense foods in the home. Will complete provided nutritional and psychosocial assessment questionnaire before the next appointment. Will be scheduled for indirect calorimetry to determine resting energy expenditure in a fasting state.  This will allow us   to create a reduced calorie, high-protein meal plan to promote loss of fat mass while preserving muscle mass. Counseled on the health benefits of losing 5%-15% of total body weight. Was counseled on nutritional approaches to weight loss and benefits of reducing processed foods and consuming plant-based foods and high quality protein as part of nutritional weight management. Was counseled on pharmacotherapy and role as an adjunct in weight management.   Obesity Education Performed Today:  She was weighed on the bioimpedance scale and results were discussed and documented in the synopsis.  We discussed obesity as a disease and the importance of a more detailed evaluation of all the factors contributing to the disease.  We discussed the importance of long term lifestyle changes which include nutrition, exercise and behavioral modifications as well as the importance of customizing this to her specific health and social needs.  We discussed the benefits of reaching a healthier weight to alleviate the symptoms of existing conditions and reduce the risks of the biomechanical, metabolic and psychological effects of obesity.  Lynn Martinez appears to be in the action stage of change and states they are ready to start intensive lifestyle modifications and behavioral modifications.   43 minutes was spent today on this visit including the  above counseling, pre-visit chart review, and post-visit documentation.  Reviewed by clinician on day of visit: allergies, medications, problem list, medical history, surgical history, family history, social history, and previous encounter notes pertinent to obesity diagnosis.    Lynn Martinez Bathsheba Durrett FNP-C   "

## 2024-07-19 ENCOUNTER — Ambulatory Visit: Payer: MEDICAID

## 2024-07-23 ENCOUNTER — Ambulatory Visit: Payer: MEDICAID

## 2024-08-12 ENCOUNTER — Ambulatory Visit: Payer: MEDICAID | Admitting: Family Medicine

## 2024-08-26 ENCOUNTER — Ambulatory Visit: Payer: MEDICAID | Admitting: Family Medicine

## 2024-10-15 ENCOUNTER — Ambulatory Visit: Payer: MEDICAID | Admitting: Internal Medicine

## 2024-12-17 ENCOUNTER — Ambulatory Visit: Payer: Self-pay | Admitting: Family Medicine
